# Patient Record
Sex: Female | Born: 1940 | Race: White | Hispanic: No | Smoking: Former smoker
Health system: Southern US, Community
[De-identification: ages and names within clinical notes are randomized; demographics above are authoritative.]

## PROBLEM LIST (undated history)

## (undated) DIAGNOSIS — I459 Conduction disorder, unspecified: Secondary | ICD-10-CM

## (undated) DIAGNOSIS — Z923 Personal history of irradiation: Secondary | ICD-10-CM

## (undated) DIAGNOSIS — M1712 Unilateral primary osteoarthritis, left knee: Secondary | ICD-10-CM

## (undated) DIAGNOSIS — M8580 Other specified disorders of bone density and structure, unspecified site: Secondary | ICD-10-CM

## (undated) DIAGNOSIS — Z8719 Personal history of other diseases of the digestive system: Secondary | ICD-10-CM

## (undated) DIAGNOSIS — I10 Essential (primary) hypertension: Secondary | ICD-10-CM

## (undated) DIAGNOSIS — K5790 Diverticulosis of intestine, part unspecified, without perforation or abscess without bleeding: Secondary | ICD-10-CM

## (undated) DIAGNOSIS — R739 Hyperglycemia, unspecified: Secondary | ICD-10-CM

## (undated) DIAGNOSIS — F419 Anxiety disorder, unspecified: Secondary | ICD-10-CM

## (undated) DIAGNOSIS — C3490 Malignant neoplasm of unspecified part of unspecified bronchus or lung: Secondary | ICD-10-CM

## (undated) DIAGNOSIS — K219 Gastro-esophageal reflux disease without esophagitis: Secondary | ICD-10-CM

## (undated) DIAGNOSIS — K649 Unspecified hemorrhoids: Secondary | ICD-10-CM

## (undated) DIAGNOSIS — C801 Malignant (primary) neoplasm, unspecified: Secondary | ICD-10-CM

## (undated) DIAGNOSIS — M1711 Unilateral primary osteoarthritis, right knee: Secondary | ICD-10-CM

## (undated) DIAGNOSIS — K5900 Constipation, unspecified: Secondary | ICD-10-CM

## (undated) DIAGNOSIS — E785 Hyperlipidemia, unspecified: Secondary | ICD-10-CM

## (undated) HISTORY — PX: TOTAL ABDOMINAL HYSTERECTOMY W/ BILATERAL SALPINGOOPHORECTOMY: SHX83

## (undated) HISTORY — PX: ABDOMINAL HYSTERECTOMY: SHX81

## (undated) HISTORY — PX: EYE SURGERY: SHX253

## (undated) HISTORY — PX: COLONOSCOPY: SHX174

## (undated) HISTORY — PX: CATARACT EXTRACTION, BILATERAL: SHX1313

---

## 2011-12-03 ENCOUNTER — Other Ambulatory Visit: Payer: Self-pay | Admitting: Family Medicine

## 2011-12-03 DIAGNOSIS — I10 Essential (primary) hypertension: Secondary | ICD-10-CM

## 2011-12-03 DIAGNOSIS — Z1231 Encounter for screening mammogram for malignant neoplasm of breast: Secondary | ICD-10-CM

## 2011-12-03 DIAGNOSIS — E785 Hyperlipidemia, unspecified: Secondary | ICD-10-CM

## 2011-12-03 HISTORY — DX: Essential (primary) hypertension: I10

## 2011-12-03 HISTORY — DX: Hyperlipidemia, unspecified: E78.5

## 2011-12-11 ENCOUNTER — Ambulatory Visit
Admission: RE | Admit: 2011-12-11 | Discharge: 2011-12-11 | Disposition: A | Discharge status: Home / Self Care | Payer: Medicare Other | Source: Ambulatory Visit | Attending: Family Medicine | Admitting: Family Medicine

## 2011-12-11 DIAGNOSIS — Z1231 Encounter for screening mammogram for malignant neoplasm of breast: Secondary | ICD-10-CM

## 2012-05-27 DIAGNOSIS — R739 Hyperglycemia, unspecified: Secondary | ICD-10-CM

## 2012-05-27 HISTORY — DX: Hyperglycemia, unspecified: R73.9

## 2014-04-12 ENCOUNTER — Other Ambulatory Visit: Payer: Self-pay

## 2014-04-12 DIAGNOSIS — Z1231 Encounter for screening mammogram for malignant neoplasm of breast: Secondary | ICD-10-CM

## 2014-04-20 ENCOUNTER — Ambulatory Visit
Admission: RE | Admit: 2014-04-20 | Discharge: 2014-04-20 | Disposition: A | Discharge status: Home / Self Care | Payer: Medicare Other | Source: Ambulatory Visit

## 2014-04-20 DIAGNOSIS — Z1231 Encounter for screening mammogram for malignant neoplasm of breast: Secondary | ICD-10-CM

## 2015-02-20 ENCOUNTER — Other Ambulatory Visit: Payer: Self-pay | Admitting: *Deleted

## 2015-02-20 ENCOUNTER — Other Ambulatory Visit: Payer: Self-pay | Admitting: Physician Assistant

## 2015-02-20 DIAGNOSIS — E2839 Other primary ovarian failure: Secondary | ICD-10-CM

## 2015-04-27 ENCOUNTER — Other Ambulatory Visit: Payer: Self-pay

## 2015-04-27 DIAGNOSIS — Z1231 Encounter for screening mammogram for malignant neoplasm of breast: Secondary | ICD-10-CM

## 2015-05-11 ENCOUNTER — Ambulatory Visit
Admission: RE | Admit: 2015-05-11 | Discharge: 2015-05-11 | Disposition: A | Discharge status: Home / Self Care | Payer: Medicare Other | Source: Ambulatory Visit | Attending: Physician Assistant | Admitting: Physician Assistant

## 2015-05-11 ENCOUNTER — Ambulatory Visit
Admission: RE | Admit: 2015-05-11 | Discharge: 2015-05-11 | Disposition: A | Discharge status: Home / Self Care | Payer: Medicare Other | Source: Ambulatory Visit

## 2015-05-11 DIAGNOSIS — Z1231 Encounter for screening mammogram for malignant neoplasm of breast: Secondary | ICD-10-CM

## 2015-05-11 DIAGNOSIS — E2839 Other primary ovarian failure: Secondary | ICD-10-CM

## 2015-05-30 DIAGNOSIS — M8580 Other specified disorders of bone density and structure, unspecified site: Secondary | ICD-10-CM | POA: Diagnosis present

## 2015-05-30 HISTORY — DX: Other specified disorders of bone density and structure, unspecified site: M85.80

## 2015-09-25 DIAGNOSIS — M25461 Effusion, right knee: Secondary | ICD-10-CM | POA: Diagnosis not present

## 2015-09-25 DIAGNOSIS — M25561 Pain in right knee: Secondary | ICD-10-CM | POA: Diagnosis not present

## 2015-09-25 DIAGNOSIS — S83241A Other tear of medial meniscus, current injury, right knee, initial encounter: Secondary | ICD-10-CM | POA: Diagnosis not present

## 2015-10-12 DIAGNOSIS — S83241D Other tear of medial meniscus, current injury, right knee, subsequent encounter: Secondary | ICD-10-CM | POA: Diagnosis not present

## 2015-10-12 DIAGNOSIS — M545 Low back pain: Secondary | ICD-10-CM | POA: Diagnosis not present

## 2015-10-12 DIAGNOSIS — M25551 Pain in right hip: Secondary | ICD-10-CM | POA: Diagnosis not present

## 2015-10-19 DIAGNOSIS — M5106 Intervertebral disc disorders with myelopathy, lumbar region: Secondary | ICD-10-CM | POA: Diagnosis not present

## 2015-10-19 DIAGNOSIS — M1711 Unilateral primary osteoarthritis, right knee: Secondary | ICD-10-CM | POA: Diagnosis not present

## 2015-10-19 DIAGNOSIS — M79604 Pain in right leg: Secondary | ICD-10-CM | POA: Diagnosis not present

## 2015-10-19 DIAGNOSIS — M5416 Radiculopathy, lumbar region: Secondary | ICD-10-CM | POA: Diagnosis not present

## 2015-10-26 DIAGNOSIS — M1711 Unilateral primary osteoarthritis, right knee: Secondary | ICD-10-CM | POA: Diagnosis not present

## 2015-10-31 DIAGNOSIS — M25561 Pain in right knee: Secondary | ICD-10-CM | POA: Diagnosis not present

## 2015-11-02 DIAGNOSIS — S83241D Other tear of medial meniscus, current injury, right knee, subsequent encounter: Secondary | ICD-10-CM | POA: Diagnosis not present

## 2015-11-08 DIAGNOSIS — R9431 Abnormal electrocardiogram [ECG] [EKG]: Secondary | ICD-10-CM | POA: Diagnosis not present

## 2015-11-08 DIAGNOSIS — M1731 Unilateral post-traumatic osteoarthritis, right knee: Secondary | ICD-10-CM | POA: Diagnosis not present

## 2015-11-08 DIAGNOSIS — Z01818 Encounter for other preprocedural examination: Secondary | ICD-10-CM | POA: Diagnosis not present

## 2015-11-20 DIAGNOSIS — Z01810 Encounter for preprocedural cardiovascular examination: Secondary | ICD-10-CM | POA: Diagnosis not present

## 2015-11-20 DIAGNOSIS — R9431 Abnormal electrocardiogram [ECG] [EKG]: Secondary | ICD-10-CM | POA: Diagnosis not present

## 2015-11-21 DIAGNOSIS — Z01810 Encounter for preprocedural cardiovascular examination: Secondary | ICD-10-CM | POA: Diagnosis not present

## 2015-11-21 DIAGNOSIS — R9431 Abnormal electrocardiogram [ECG] [EKG]: Secondary | ICD-10-CM | POA: Diagnosis not present

## 2015-11-21 DIAGNOSIS — I10 Essential (primary) hypertension: Secondary | ICD-10-CM | POA: Diagnosis not present

## 2015-11-21 DIAGNOSIS — F17200 Nicotine dependence, unspecified, uncomplicated: Secondary | ICD-10-CM | POA: Diagnosis not present

## 2015-12-06 ENCOUNTER — Encounter (HOSPITAL_COMMUNITY): Payer: Self-pay | Admitting: Physician Assistant

## 2015-12-06 DIAGNOSIS — S83241D Other tear of medial meniscus, current injury, right knee, subsequent encounter: Secondary | ICD-10-CM | POA: Diagnosis not present

## 2015-12-06 DIAGNOSIS — K5790 Diverticulosis of intestine, part unspecified, without perforation or abscess without bleeding: Secondary | ICD-10-CM

## 2015-12-06 DIAGNOSIS — I459 Conduction disorder, unspecified: Secondary | ICD-10-CM | POA: Diagnosis present

## 2015-12-06 DIAGNOSIS — K5900 Constipation, unspecified: Secondary | ICD-10-CM

## 2015-12-06 DIAGNOSIS — M1711 Unilateral primary osteoarthritis, right knee: Secondary | ICD-10-CM

## 2015-12-06 DIAGNOSIS — K649 Unspecified hemorrhoids: Secondary | ICD-10-CM

## 2015-12-06 HISTORY — DX: Unspecified hemorrhoids: K64.9

## 2015-12-06 HISTORY — DX: Conduction disorder, unspecified: I45.9

## 2015-12-06 HISTORY — DX: Diverticulosis of intestine, part unspecified, without perforation or abscess without bleeding: K57.90

## 2015-12-06 HISTORY — DX: Constipation, unspecified: K59.00

## 2015-12-06 HISTORY — DX: Unilateral primary osteoarthritis, right knee: M17.11

## 2015-12-06 NOTE — H&P (Signed)
TOTAL KNEE ADMISSION H&P  Patient is being admitted for right unicompartmental knee arthroplasty.  Subjective:  Chief Complaint:right knee pain.  HPI: [name], 75 y.o. female, has a history of pain and functional disability in the right knee due to arthritis and has failed non-surgical conservative treatments for greater than 12 weeks to includeNSAID's and/or analgesics, corticosteriod injections, viscosupplementation injections, flexibility and strengthening excercises, supervised PT with diminished ADL's post treatment, use of assistive devices, weight reduction as appropriate and activity modification.  Onset of symptoms was gradual, starting 10 years ago with gradually worsening course since that time. The patient noted no past surgery on the right knee(s).  Patient currently rates pain in the right knee(s) at 10 out of 10 with activity. Patient has night pain, worsening of pain with activity and weight bearing, pain that interferes with activities of daily living, crepitus and joint swelling.  Patient has evidence of subchondral sclerosis, periarticular osteophytes and joint space narrowing by imaging studies.  There is no active infection.  Patient Active Problem List   Diagnosis Date Noted  . 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 CN (constipation) 12/06/2015  . 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 Hemorrhoid 12/06/2015  . Primary localized osteoarthritis of right knee 12/06/2015  . Osteopenia 05/30/2015  . Blood glucose elevated 05/27/2012  . BP (high blood pressure) 12/03/2011  . HLD (hyperlipidemia) 12/03/2011   Past Medical History  Diagnosis Date  . 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 CN (constipation) 12/06/2015  . DD (diverticular disease) 12/06/2015    Overview:  Dr Benson Norway  Last Assessment & Plan:  Relevant Hx: Course: Daily  Update: Today's Plan:   . Hemorrhoid 12/06/2015  . BP (high blood pressure) 12/03/2011  . Blood glucose elevated 05/27/2012  . HLD (hyperlipidemia) 12/03/2011  . Osteopenia 05/30/2015    Overview:  Bone density 05/2015 started fosamax   . Primary localized osteoarthritis of right knee 12/06/2015    No past surgical history on file.  No prescriptions prior to admission   Allergies  Allergen Reactions  . Codeine   . Meperidine   . Sulfa Antibiotics     Social History  Substance Use Topics  . Smoking status: Not on file  . Smokeless tobacco: Not on file  . Alcohol Use: Not on file    No family history on file.   Review of Systems  Constitutional: Negative.   HENT: Negative.   Eyes: Negative.   Respiratory: Negative.   Cardiovascular: Negative.   Gastrointestinal: Negative.   Genitourinary: Negative.   Musculoskeletal: Positive for back pain and joint pain.  Skin: Negative.   Neurological: Negative.   Endo/Heme/Allergies: Negative.     Objective:  Physical Exam  Constitutional: She is oriented to person, place, and time. She appears well-developed and well-nourished.  HENT:  Head: Normocephalic and atraumatic.  Mouth/Throat: Oropharynx is clear and moist.  Eyes: Conjunctivae are normal. Pupils are equal, round, and reactive to light.  Neck: Normal range of motion. Neck supple.  Cardiovascular: Normal rate, regular rhythm and normal heart sounds.   Respiratory: Effort normal and breath sounds normal.  GI: Soft. Bowel sounds are normal.  Genitourinary:  Not pertinent to current symptomatology therefore not examined.  Musculoskeletal:  Examination of her right knee reveals pain medially. There is 3+ effusion. The range of motion is from 0-120 degrees. The knee  is stable with normal patellar tracking. Examination of the left knee reveals a full range of motion without pain, weakness or instability. Vascular exam: pulses 2+ and symmetric. Neurologic exam, distal motor and  sensory examination within normal limits.    Neurological: She is alert and oriented to person, place, and time.  Skin: Skin is warm and dry.  Psychiatric: She has a normal mood and affect. Her behavior is normal.    Vital signs in last 24 hours: Temp:  [97.5 F (36.4 C)] 97.5 F (36.4 C) (03/29 1500) Pulse Rate:  [73] 73 (03/29 1500) BP: (129)/(76) 129/76 mmHg (03/29 1500) SpO2:  [100 %] 100 % (03/29 1500) Weight:  [70.761 kg (156 lb)] 70.761 kg (156 lb) (03/29 1500)  Labs:   Estimated body mass index is 27.64 kg/(m^2) as calculated from the following:   Height as of this encounter: '5\' 3"'$  (1.6 m).   Weight as of this encounter: 70.761 kg (156 lb).   Imaging Review Plain radiographs demonstrate severe degenerative joint disease of the right knee(s). The overall alignment issignificant varus. The bone quality appears to be good for age and reported activity level.  Assessment/Plan:  End stage arthritis, right knee Principal Problem:   Primary localized osteoarthritis of right knee Active Problems:   Cardiac conduction disorder   CN (constipation)   DD (diverticular disease)   Hemorrhoid   BP (high blood pressure)   Blood glucose elevated   HLD (hyperlipidemia)   Osteopenia    The patient history, physical examination, clinical judgment of the provider and imaging studies are consistent with end stage degenerative joint disease of the right knee(s) and total knee arthroplasty is deemed medically necessary. The treatment options including medical management, injection therapy arthroscopy and arthroplasty were discussed at length. The risks and benefits of total knee arthroplasty were presented and reviewed. The risks due to aseptic loosening, infection, stiffness, patella tracking problems, thromboembolic complications and other imponderables were discussed. The patient acknowledged the explanation, agreed to proceed with the plan and consent was signed. Patient is being  admitted for inpatient treatment for surgery, pain control, PT, OT, prophylactic antibiotics, VTE prophylaxis, progressive ambulation and ADL's and discharge planning. The patient is planning to be discharged home with home health services

## 2015-12-07 ENCOUNTER — Encounter (HOSPITAL_COMMUNITY): Payer: Self-pay

## 2015-12-07 ENCOUNTER — Encounter (HOSPITAL_COMMUNITY)
Admission: RE | Admit: 2015-12-07 | Discharge: 2015-12-07 | Disposition: A | Discharge status: Home / Self Care | Payer: Medicare Other | Source: Ambulatory Visit | Attending: Orthopedic Surgery | Admitting: Orthopedic Surgery

## 2015-12-07 DIAGNOSIS — Z7982 Long term (current) use of aspirin: Secondary | ICD-10-CM | POA: Diagnosis not present

## 2015-12-07 DIAGNOSIS — Z0183 Encounter for blood typing: Secondary | ICD-10-CM | POA: Insufficient documentation

## 2015-12-07 DIAGNOSIS — E785 Hyperlipidemia, unspecified: Secondary | ICD-10-CM | POA: Diagnosis not present

## 2015-12-07 DIAGNOSIS — Z01812 Encounter for preprocedural laboratory examination: Secondary | ICD-10-CM | POA: Insufficient documentation

## 2015-12-07 DIAGNOSIS — M1711 Unilateral primary osteoarthritis, right knee: Secondary | ICD-10-CM | POA: Insufficient documentation

## 2015-12-07 DIAGNOSIS — Z79899 Other long term (current) drug therapy: Secondary | ICD-10-CM | POA: Insufficient documentation

## 2015-12-07 DIAGNOSIS — I10 Essential (primary) hypertension: Secondary | ICD-10-CM | POA: Diagnosis not present

## 2015-12-07 DIAGNOSIS — K219 Gastro-esophageal reflux disease without esophagitis: Secondary | ICD-10-CM | POA: Diagnosis not present

## 2015-12-07 DIAGNOSIS — Z01818 Encounter for other preprocedural examination: Secondary | ICD-10-CM | POA: Insufficient documentation

## 2015-12-07 DIAGNOSIS — Z7983 Long term (current) use of bisphosphonates: Secondary | ICD-10-CM | POA: Insufficient documentation

## 2015-12-07 HISTORY — DX: Gastro-esophageal reflux disease without esophagitis: K21.9

## 2015-12-07 HISTORY — DX: Personal history of other diseases of the digestive system: Z87.19

## 2015-12-07 HISTORY — DX: Malignant (primary) neoplasm, unspecified: C80.1

## 2015-12-07 LAB — CBC WITH DIFFERENTIAL/PLATELET
BASOS ABS: 0 10*3/uL (ref 0.0–0.1)
Basophils Relative: 0 %
Eosinophils Absolute: 0.1 10*3/uL (ref 0.0–0.7)
Eosinophils Relative: 1 %
HEMATOCRIT: 44.2 % (ref 36.0–46.0)
HEMOGLOBIN: 15.1 g/dL — AB (ref 12.0–15.0)
LYMPHS PCT: 19 %
Lymphs Abs: 2 10*3/uL (ref 0.7–4.0)
MCH: 32.5 pg (ref 26.0–34.0)
MCHC: 34.2 g/dL (ref 30.0–36.0)
MCV: 95.3 fL (ref 78.0–100.0)
MONO ABS: 0.5 10*3/uL (ref 0.1–1.0)
MONOS PCT: 5 %
NEUTROS ABS: 8 10*3/uL — AB (ref 1.7–7.7)
Neutrophils Relative %: 75 %
Platelets: 243 10*3/uL (ref 150–400)
RBC: 4.64 MIL/uL (ref 3.87–5.11)
RDW: 13.1 % (ref 11.5–15.5)
WBC: 10.6 10*3/uL — ABNORMAL HIGH (ref 4.0–10.5)

## 2015-12-07 LAB — TYPE AND SCREEN
ABO/RH(D): A POS
ANTIBODY SCREEN: NEGATIVE

## 2015-12-07 LAB — COMPREHENSIVE METABOLIC PANEL
ALK PHOS: 66 U/L (ref 38–126)
ALT: 13 U/L — ABNORMAL LOW (ref 14–54)
AST: 16 U/L (ref 15–41)
Albumin: 3.9 g/dL (ref 3.5–5.0)
Anion gap: 11 (ref 5–15)
BUN: 12 mg/dL (ref 6–20)
CO2: 21 mmol/L — ABNORMAL LOW (ref 22–32)
CREATININE: 0.88 mg/dL (ref 0.44–1.00)
Calcium: 9.4 mg/dL (ref 8.9–10.3)
Chloride: 104 mmol/L (ref 101–111)
GFR calc Af Amer: >60 mL/min (ref >60)
Glucose, Bld: 97 mg/dL (ref 65–99)
POTASSIUM: 4.2 mmol/L (ref 3.5–5.1)
Sodium: 136 mmol/L (ref 135–145)
TOTAL PROTEIN: 6.5 g/dL (ref 6.5–8.1)
Total Bilirubin: 0.6 mg/dL (ref 0.3–1.2)

## 2015-12-07 LAB — PROTIME-INR
INR: 1.03 (ref 0.00–1.49)
PROTHROMBIN TIME: 13.7 s (ref 11.6–15.2)

## 2015-12-07 LAB — SURGICAL PCR SCREEN
MRSA, PCR: NEGATIVE
Staphylococcus aureus: NEGATIVE

## 2015-12-07 LAB — ABO/RH: ABO/RH(D): A POS

## 2015-12-07 LAB — APTT: APTT: 27 s (ref 24–37)

## 2015-12-07 NOTE — Pre-Procedure Instructions (Signed)
Requested EKG from Dr. Fayrene Fearing office (PCP).  Will request records and clearance from Dr. Irven Shelling office.  Notes from Dr. Fayrene Fearing office in [place] (was referred to Dr. Einar Gip for inverted T waves on EKG, patient states 'everything turned out fine').

## 2015-12-07 NOTE — Pre-Procedure Instructions (Signed)
[name]  12/07/2015      [name] [facility] [id number]: [phone]: [phone]   Your procedure is scheduled on Monday April 10th.  [facility]  Call this number if you have problems the morning of surgery:  [phone]   Remember:  Do not eat food or drink liquids after midnight Sunday April 9th.  Take these medicines the morning of surgery with A SIP OF WATER acetaminophen (tylenol) if needed, cetirizine (zyrtec) if needed, metoprolol succinate (toprol-xl)  STOP: ALL Vitamins, Supplements, Effient and Herbal Medications, Fish Oils, Aspirins, NSAIDs (Nonsteroidal Anti-inflammatories such as Ibuprofen, Aleve, or Advil), and Goody's/BC Powders 5-7 days prior to surgery, until after surgery as directed by your physician. Stop diclofenac (voltaren) gel, meloxicam    Do not wear jewelry, make-up or nail polish.  Do not wear lotions, powders, or perfumes.  You may wear deodorant.  Do not shave 48 hours prior to surgery.  Men may shave face and neck.  Do not bring valuables to the hospital.  [name] or [facility]ables.  Contacts, dentures or bridgework may not be worn into surgery.  Leave your suitcase in the car.  After surgery it may be brought to your room.  For patients admitted to the hospital, discharge time will be determined by your treatment team.  Patients discharged the day of surgery will not be allowed to drive home.        [name] Before surgery, you can play an important role.  Because skin is not sterile, your skin needs to be as free of germs as possible.  You can reduce the number of germs on your skin by washing with CHG (chlorahexidine gluconate) Soap before surgery.  CHG is an antiseptic cleaner with kills germs and bonds with the  skin to continue killing germs even after washing.   Please do not use if you have an allergy to CHG or antibacterial soaps.  If your skin becomes reddened/irritated stop using the CHG.  Do not shave (including legs and underarms) for at least 48 hours prior to first CHG shower.  It is okay to shave your face.  Please follow these instructions carefully:  1. Shower with CHG Soap the night before surgery and the morning of Surgery. 2. If you choose to wash your hair, wash your hair first as usual with your normal shampoo. 3. After you shampoo, rinse your hair and body thoroughly to remove the Shampoo. 4. Use CHG as you would any other liquid soap. You can apply chg directly to the skin and wash gently with scrungie or a clean washcloth. 5. Apply the CHG Soap to your body ONLY FROM THE NECK DOWN. Do not use on open wounds or open sores. Avoid contact with your eyes, ears, mouth and genitals (private parts). Wash genitals (private parts) with your normal soap. 6. Wash thoroughly, paying special attention to the area where your surgery will be performed. 7. Thoroughly rinse your body with warm water from the neck down. 8. DO NOT shower/wash with your normal soap after using and rinsing off the CHG Soap. 9. Pat yourself dry with a clean towel.  10. Wear clean pajamas.  11. Place clean sheets on your bed the night of your first shower and do not  sleep with pets.  Day of Surgery  Do not apply any lotions/deodorants the morning of surgery. Please wear clean clothes to the hospital/surgery center.   Please read over the following fact sheets that you were given. Pain Booklet, Coughing and Deep Breathing, Blood Transfusion Information, MRSA Information and Surgical Site Infection Prevention

## 2015-12-08 NOTE — Progress Notes (Signed)
Anesthesia Chart Review: Patient is a 75 year old female scheduled for right unicompartmental knee replacement on 12/18/15 by Dr. Noemi Chapel.  History includes smoking, HTN, HLD, hiatal hernia, GERD, skin cancer, elevated glucose (without mention of DM; A1c 5.8 02/20/15), hysterectomy. BMI 28.   PCP is Dr. Anastasia Pall ([facility]). He saw her for pre-operative evaluation on 11/08/15. Due to "inverted T waves" in anterior leads he referred her for cardiac stress testing. She was referred to [facility] and saw Neldon Labella, NP with Dr. Adrian Prows. Perfusion imaging was normal so patient was cleared with "acceptable cardiovascular risk" PRN cardiology follow-up recommended.  Medications include Fosamax, ASA, Tums, Zyrtec, Mobic, Toprol XL, Zocor.  11/21/15 EKG [facility]): NSR, non-specific ST/T wave abnormality.  11/20/15 Nuclear stress test [facility] CV): Impression: 1. The resting ECG demonstrated NSR, normal resting conduction, no resting arrythmias and non-specific ST-T changes. Stress ECG is non-diagnostic for ischemia as it a pharmacologic stress using Lexiscan. Stress symptoms included dyspnea.  2. Myocardial perfusion imaging is normal. Overall LV systolic function was normal without regional wall motion abnormalities. LVEF was 63%.   Reportedly, she had a cardiac cath in 2011 and "was told it was completely normal." I do not have that report.  Preoperative labs noted. Urine culture showed > 100,000 gram negative rods. Final report with sensitivities are pending. Have communicated with Luna Glasgow, PA regarding positive urine culture results. Will defer treatment recommendations to the surgeon/PA.  If no acute changes then I anticipate that she can proceed as planned from an anesthesia standpoint.  George Hugh [facility] Short Stay Center/Anesthesiology Phone [phone] 12/08/2015 4:36 PM

## 2015-12-09 LAB — URINE CULTURE: Culture: >=100000

## 2015-12-14 ENCOUNTER — Other Ambulatory Visit (HOSPITAL_COMMUNITY): Payer: Medicare Other

## 2015-12-15 MED ORDER — CEFAZOLIN SODIUM-DEXTROSE 2-4 GM/100ML-% IV SOLN
2.0000 g | INTRAVENOUS | Status: AC
  Administered 2015-12-18: 2 g via INTRAVENOUS
  Filled 2015-12-15: qty 100

## 2015-12-15 MED ORDER — LACTATED RINGERS IV SOLN
INTRAVENOUS | Status: DC
  Administered 2015-12-18 (×3): via INTRAVENOUS

## 2015-12-18 ENCOUNTER — Inpatient Hospital Stay (HOSPITAL_COMMUNITY): Payer: Medicare Other | Admitting: Vascular Surgery

## 2015-12-18 ENCOUNTER — Inpatient Hospital Stay (HOSPITAL_COMMUNITY)
Admission: RE | Admit: 2015-12-18 | Discharge: 2015-12-19 | DRG: 470 | Disposition: A | Discharge status: Home Health | Payer: Medicare Other | Source: Ambulatory Visit | Attending: Orthopedic Surgery | Admitting: Orthopedic Surgery

## 2015-12-18 ENCOUNTER — Encounter (HOSPITAL_COMMUNITY): Payer: Self-pay | Admitting: Certified Registered Nurse Anesthetist

## 2015-12-18 ENCOUNTER — Inpatient Hospital Stay (HOSPITAL_COMMUNITY): Payer: Medicare Other | Admitting: Certified Registered Nurse Anesthetist

## 2015-12-18 ENCOUNTER — Encounter (HOSPITAL_COMMUNITY)
Admission: RE | Disposition: A | Discharge status: Home Health | Payer: Self-pay | Source: Ambulatory Visit | Attending: Orthopedic Surgery

## 2015-12-18 DIAGNOSIS — I10 Essential (primary) hypertension: Secondary | ICD-10-CM | POA: Diagnosis present

## 2015-12-18 DIAGNOSIS — M8580 Other specified disorders of bone density and structure, unspecified site: Secondary | ICD-10-CM | POA: Diagnosis present

## 2015-12-18 DIAGNOSIS — G8918 Other acute postprocedural pain: Secondary | ICD-10-CM | POA: Diagnosis not present

## 2015-12-18 DIAGNOSIS — E785 Hyperlipidemia, unspecified: Secondary | ICD-10-CM | POA: Diagnosis present

## 2015-12-18 DIAGNOSIS — K5900 Constipation, unspecified: Secondary | ICD-10-CM | POA: Diagnosis present

## 2015-12-18 DIAGNOSIS — M1711 Unilateral primary osteoarthritis, right knee: Principal | ICD-10-CM | POA: Diagnosis present

## 2015-12-18 DIAGNOSIS — I459 Conduction disorder, unspecified: Secondary | ICD-10-CM | POA: Diagnosis present

## 2015-12-18 DIAGNOSIS — K219 Gastro-esophageal reflux disease without esophagitis: Secondary | ICD-10-CM | POA: Diagnosis present

## 2015-12-18 DIAGNOSIS — K5790 Diverticulosis of intestine, part unspecified, without perforation or abscess without bleeding: Secondary | ICD-10-CM | POA: Diagnosis present

## 2015-12-18 DIAGNOSIS — R739 Hyperglycemia, unspecified: Secondary | ICD-10-CM | POA: Diagnosis present

## 2015-12-18 DIAGNOSIS — F17200 Nicotine dependence, unspecified, uncomplicated: Secondary | ICD-10-CM | POA: Diagnosis present

## 2015-12-18 DIAGNOSIS — J449 Chronic obstructive pulmonary disease, unspecified: Secondary | ICD-10-CM | POA: Diagnosis present

## 2015-12-18 DIAGNOSIS — M25561 Pain in right knee: Secondary | ICD-10-CM | POA: Diagnosis not present

## 2015-12-18 DIAGNOSIS — Z85828 Personal history of other malignant neoplasm of skin: Secondary | ICD-10-CM

## 2015-12-18 DIAGNOSIS — M179 Osteoarthritis of knee, unspecified: Secondary | ICD-10-CM | POA: Diagnosis not present

## 2015-12-18 DIAGNOSIS — K649 Unspecified hemorrhoids: Secondary | ICD-10-CM | POA: Diagnosis present

## 2015-12-18 HISTORY — DX: Hyperlipidemia, unspecified: E78.5

## 2015-12-18 HISTORY — DX: Constipation, unspecified: K59.00

## 2015-12-18 HISTORY — DX: Diverticulosis of intestine, part unspecified, without perforation or abscess without bleeding: K57.90

## 2015-12-18 HISTORY — DX: Unspecified hemorrhoids: K64.9

## 2015-12-18 HISTORY — PX: PARTIAL KNEE ARTHROPLASTY: SHX2174

## 2015-12-18 HISTORY — DX: Unilateral primary osteoarthritis, right knee: M17.11

## 2015-12-18 HISTORY — DX: Hyperglycemia, unspecified: R73.9

## 2015-12-18 HISTORY — DX: Other specified disorders of bone density and structure, unspecified site: M85.80

## 2015-12-18 HISTORY — DX: Essential (primary) hypertension: I10

## 2015-12-18 HISTORY — DX: Conduction disorder, unspecified: I45.9

## 2015-12-18 SURGERY — ARTHROPLASTY, KNEE, UNICOMPARTMENTAL
Anesthesia: General | Site: Knee | Laterality: Right

## 2015-12-18 MED ORDER — CALCIUM CARBONATE ANTACID 500 MG PO CHEW
1.0000 | CHEWABLE_TABLET | Freq: Every day | ORAL | Status: DC
  Administered 2015-12-19: 200 mg via ORAL
  Filled 2015-12-18 (×2): qty 1

## 2015-12-18 MED ORDER — METOPROLOL SUCCINATE ER 50 MG PO TB24
50.0000 mg | ORAL_TABLET | Freq: Every day | ORAL | Status: DC
  Administered 2015-12-19: 50 mg via ORAL
  Filled 2015-12-18: qty 1

## 2015-12-18 MED ORDER — CHLORHEXIDINE GLUCONATE 4 % EX LIQD: 60.0000 mL | Freq: Once | CUTANEOUS | Status: DC

## 2015-12-18 MED ORDER — HYDROMORPHONE HCL 1 MG/ML IJ SOLN: 0.5000 mg | INTRAMUSCULAR | Status: DC | PRN

## 2015-12-18 MED ORDER — PROPOFOL 500 MG/50ML IV EMUL
INTRAVENOUS | Status: DC | PRN
  Administered 2015-12-18: 75 ug/kg/min via INTRAVENOUS

## 2015-12-18 MED ORDER — POTASSIUM CHLORIDE IN NACL 20-0.9 MEQ/L-% IV SOLN
INTRAVENOUS | Status: DC
  Administered 2015-12-18 (×2): via INTRAVENOUS
  Filled 2015-12-18 (×2): qty 1000

## 2015-12-18 MED ORDER — DEXAMETHASONE SODIUM PHOSPHATE 10 MG/ML IJ SOLN
INTRAMUSCULAR | Status: DC | PRN
  Administered 2015-12-18: 10 mg via INTRAVENOUS

## 2015-12-18 MED ORDER — MENTHOL 3 MG MT LOZG: 1.0000 | LOZENGE | OROMUCOSAL | Status: DC | PRN

## 2015-12-18 MED ORDER — HYDROMORPHONE HCL 2 MG PO TABS
2.0000 mg | ORAL_TABLET | ORAL | Status: DC | PRN
  Administered 2015-12-18: 2 mg via ORAL
  Administered 2015-12-18: 4 mg via ORAL
  Administered 2015-12-18 (×2): 2 mg via ORAL
  Administered 2015-12-19 (×2): 4 mg via ORAL
  Administered 2015-12-19: 2 mg via ORAL
  Filled 2015-12-18: qty 1
  Filled 2015-12-18 (×2): qty 2
  Filled 2015-12-18 (×2): qty 1
  Filled 2015-12-18: qty 2
  Filled 2015-12-18: qty 1

## 2015-12-18 MED ORDER — FENTANYL CITRATE (PF) 250 MCG/5ML IJ SOLN
INTRAMUSCULAR | Status: AC
  Filled 2015-12-18: qty 5

## 2015-12-18 MED ORDER — POLYETHYLENE GLYCOL 3350 17 G PO PACK
17.0000 g | PACK | Freq: Two times a day (BID) | ORAL | Status: DC
  Administered 2015-12-18 – 2015-12-19 (×3): 17 g via ORAL
  Filled 2015-12-18 (×3): qty 1

## 2015-12-18 MED ORDER — SIMVASTATIN 20 MG PO TABS
20.0000 mg | ORAL_TABLET | Freq: Every day | ORAL | Status: DC
  Administered 2015-12-18: 20 mg via ORAL
  Filled 2015-12-18: qty 1

## 2015-12-18 MED ORDER — PROPOFOL 10 MG/ML IV BOLUS
INTRAVENOUS | Status: AC
  Filled 2015-12-18: qty 40

## 2015-12-18 MED ORDER — MIDAZOLAM HCL 2 MG/2ML IJ SOLN
INTRAMUSCULAR | Status: AC
  Filled 2015-12-18: qty 2

## 2015-12-18 MED ORDER — 0.9 % SODIUM CHLORIDE (POUR BTL) OPTIME
TOPICAL | Status: DC | PRN
  Administered 2015-12-18: 1000 mL

## 2015-12-18 MED ORDER — ACETAMINOPHEN 325 MG PO TABS: 650.0000 mg | ORAL_TABLET | Freq: Four times a day (QID) | ORAL | Status: DC | PRN

## 2015-12-18 MED ORDER — SODIUM CHLORIDE 0.9 % IR SOLN
Status: DC | PRN
  Administered 2015-12-18: 2000 mL

## 2015-12-18 MED ORDER — EPHEDRINE SULFATE 50 MG/ML IJ SOLN
INTRAMUSCULAR | Status: DC | PRN
  Administered 2015-12-18: 5 mg via INTRAVENOUS
  Administered 2015-12-18: 2.5 mg via INTRAVENOUS
  Administered 2015-12-18: 5 mg via INTRAVENOUS

## 2015-12-18 MED ORDER — MIDAZOLAM HCL 2 MG/2ML IJ SOLN: 0.5000 mg | Freq: Once | INTRAMUSCULAR | Status: DC | PRN

## 2015-12-18 MED ORDER — BUPIVACAINE-EPINEPHRINE 0.25% -1:200000 IJ SOLN
INTRAMUSCULAR | Status: DC | PRN
  Administered 2015-12-18: 10 mL

## 2015-12-18 MED ORDER — DIPHENHYDRAMINE HCL 12.5 MG/5ML PO ELIX: 12.5000 mg | ORAL_SOLUTION | ORAL | Status: DC | PRN

## 2015-12-18 MED ORDER — MORPHINE SULFATE (PF) 2 MG/ML IV SOLN: 2.0000 mg | INTRAVENOUS | Status: DC | PRN

## 2015-12-18 MED ORDER — BUPIVACAINE-EPINEPHRINE (PF) 0.5% -1:200000 IJ SOLN
INTRAMUSCULAR | Status: DC | PRN
  Administered 2015-12-18: 30 mL via PERINEURAL

## 2015-12-18 MED ORDER — ONDANSETRON HCL 4 MG PO TABS: 4.0000 mg | ORAL_TABLET | Freq: Four times a day (QID) | ORAL | Status: DC | PRN

## 2015-12-18 MED ORDER — LORATADINE 10 MG PO TABS: 10.0000 mg | ORAL_TABLET | Freq: Every day | ORAL | Status: DC

## 2015-12-18 MED ORDER — FENTANYL CITRATE (PF) 100 MCG/2ML IJ SOLN
INTRAMUSCULAR | Status: DC | PRN
  Administered 2015-12-18 (×2): 50 ug via INTRAVENOUS

## 2015-12-18 MED ORDER — POVIDONE-IODINE 7.5 % EX SOLN
Freq: Once | CUTANEOUS | Status: DC
  Filled 2015-12-18: qty 118

## 2015-12-18 MED ORDER — DEXAMETHASONE SODIUM PHOSPHATE 10 MG/ML IJ SOLN
10.0000 mg | Freq: Three times a day (TID) | INTRAMUSCULAR | Status: DC
  Administered 2015-12-18 – 2015-12-19 (×3): 10 mg via INTRAVENOUS
  Filled 2015-12-18 (×3): qty 1

## 2015-12-18 MED ORDER — BUPIVACAINE-EPINEPHRINE (PF) 0.25% -1:200000 IJ SOLN
INTRAMUSCULAR | Status: AC
  Filled 2015-12-18: qty 30

## 2015-12-18 MED ORDER — METOCLOPRAMIDE HCL 5 MG/ML IJ SOLN: 5.0000 mg | Freq: Three times a day (TID) | INTRAMUSCULAR | Status: DC | PRN

## 2015-12-18 MED ORDER — PHENOL 1.4 % MT LIQD: 1.0000 | OROMUCOSAL | Status: DC | PRN

## 2015-12-18 MED ORDER — ONDANSETRON HCL 4 MG/2ML IJ SOLN: 4.0000 mg | Freq: Four times a day (QID) | INTRAMUSCULAR | Status: DC | PRN

## 2015-12-18 MED ORDER — BUPIVACAINE IN DEXTROSE 0.75-8.25 % IT SOLN
INTRATHECAL | Status: DC | PRN
  Administered 2015-12-18: 13.5 mg via INTRATHECAL

## 2015-12-18 MED ORDER — ADULT MULTIVITAMIN W/MINERALS CH
1.0000 | ORAL_TABLET | Freq: Every day | ORAL | Status: DC
  Administered 2015-12-18 – 2015-12-19 (×2): 1 via ORAL
  Filled 2015-12-18 (×2): qty 1

## 2015-12-18 MED ORDER — DOCUSATE SODIUM 100 MG PO CAPS
100.0000 mg | ORAL_CAPSULE | Freq: Two times a day (BID) | ORAL | Status: DC
  Administered 2015-12-18 – 2015-12-19 (×3): 100 mg via ORAL
  Filled 2015-12-18 (×3): qty 1

## 2015-12-18 MED ORDER — PHENYLEPHRINE HCL 10 MG/ML IJ SOLN
INTRAMUSCULAR | Status: DC | PRN
  Administered 2015-12-18 (×2): 80 ug via INTRAVENOUS

## 2015-12-18 MED ORDER — ACETAMINOPHEN 650 MG RE SUPP: 650.0000 mg | Freq: Four times a day (QID) | RECTAL | Status: DC | PRN

## 2015-12-18 MED ORDER — ASPIRIN EC 325 MG PO TBEC
325.0000 mg | DELAYED_RELEASE_TABLET | Freq: Every day | ORAL | Status: DC
  Administered 2015-12-19: 325 mg via ORAL
  Filled 2015-12-18: qty 1

## 2015-12-18 MED ORDER — LIDOCAINE HCL (CARDIAC) 20 MG/ML IV SOLN
INTRAVENOUS | Status: DC | PRN
  Administered 2015-12-18: 40 mg via INTRATRACHEAL

## 2015-12-18 MED ORDER — ALUM & MAG HYDROXIDE-SIMETH 200-200-20 MG/5ML PO SUSP: 30.0000 mL | ORAL | Status: DC | PRN

## 2015-12-18 MED ORDER — METOCLOPRAMIDE HCL 5 MG PO TABS
5.0000 mg | ORAL_TABLET | Freq: Three times a day (TID) | ORAL | Status: DC | PRN
Start: 2015-12-18 — End: 2015-12-19

## 2015-12-18 MED ORDER — CEFAZOLIN SODIUM-DEXTROSE 2-4 GM/100ML-% IV SOLN
2.0000 g | Freq: Four times a day (QID) | INTRAVENOUS | Status: AC
  Administered 2015-12-18 (×2): 2 g via INTRAVENOUS
  Filled 2015-12-18 (×3): qty 100

## 2015-12-18 MED ORDER — PHENYLEPHRINE HCL 10 MG/ML IJ SOLN
10.0000 mg | INTRAVENOUS | Status: DC | PRN
  Administered 2015-12-18: 25 ug/min via INTRAVENOUS

## 2015-12-18 SURGICAL SUPPLY — 72 items
BANDAGE ELASTIC 4 VELCRO ST LF (GAUZE/BANDAGES/DRESSINGS) ×3 IMPLANT
BANDAGE ELASTIC 6 VELCRO ST LF (GAUZE/BANDAGES/DRESSINGS) ×3 IMPLANT
BANDAGE ESMARK 6X9 LF (GAUZE/BANDAGES/DRESSINGS) ×1 IMPLANT
BLADE SURG 11 STRL SS (BLADE) IMPLANT
BNDG ESMARK 6X9 LF (GAUZE/BANDAGES/DRESSINGS) ×3
BOWL SMART MIX CTS (DISPOSABLE) ×3 IMPLANT
CAPT KNEE PARTIAL 2 ×3 IMPLANT
CEMENT HV SMART SET (Cement) ×3 IMPLANT
CLOSURE WOUND 1/2 X4 (GAUZE/BANDAGES/DRESSINGS) ×1
COVER SURGICAL LIGHT HANDLE (MISCELLANEOUS) ×3 IMPLANT
CUFF TOURNIQUET SINGLE 34IN LL (TOURNIQUET CUFF) ×3 IMPLANT
DRAPE EXTREMITY T 121X128X90 (DRAPE) ×3 IMPLANT
DRAPE INCISE IOBAN 66X45 STRL (DRAPES) ×3 IMPLANT
DRAPE PROXIMA HALF (DRAPES) ×3 IMPLANT
DRAPE U-SHAPE 47X51 STRL (DRAPES) ×3 IMPLANT
DRSG ADAPTIC 3X8 NADH LF (GAUZE/BANDAGES/DRESSINGS) ×3 IMPLANT
DRSG AQUACEL AG ADV 3.5X10 (GAUZE/BANDAGES/DRESSINGS) ×3 IMPLANT
DRSG PAD ABDOMINAL 8X10 ST (GAUZE/BANDAGES/DRESSINGS) ×6 IMPLANT
DURAPREP 26ML APPLICATOR (WOUND CARE) ×3 IMPLANT
ELECT CAUTERY BLADE 6.4 (BLADE) ×3 IMPLANT
ELECT REM PT RETURN 9FT ADLT (ELECTROSURGICAL) ×3
ELECTRODE REM PT RTRN 9FT ADLT (ELECTROSURGICAL) ×1 IMPLANT
EVACUATOR 1/8 PVC DRAIN (DRAIN) IMPLANT
FACESHIELD WRAPAROUND (MASK) ×3 IMPLANT
GAUZE SPONGE 4X4 12PLY STRL (GAUZE/BANDAGES/DRESSINGS) ×3 IMPLANT
GLOVE BIO SURGEON STRL SZ7 (GLOVE) ×3 IMPLANT
GLOVE BIOGEL PI IND STRL 7.0 (GLOVE) ×1 IMPLANT
GLOVE BIOGEL PI IND STRL 7.5 (GLOVE) ×1 IMPLANT
GLOVE BIOGEL PI INDICATOR 7.0 (GLOVE) ×2
GLOVE BIOGEL PI INDICATOR 7.5 (GLOVE) ×2
GLOVE SS BIOGEL STRL SZ 7.5 (GLOVE) ×1 IMPLANT
GLOVE SUPERSENSE BIOGEL SZ 7.5 (GLOVE) ×2
GOWN STRL REUS W/ TWL LRG LVL3 (GOWN DISPOSABLE) ×2 IMPLANT
GOWN STRL REUS W/ TWL XL LVL3 (GOWN DISPOSABLE) ×3 IMPLANT
GOWN STRL REUS W/TWL LRG LVL3 (GOWN DISPOSABLE) ×4
GOWN STRL REUS W/TWL XL LVL3 (GOWN DISPOSABLE) ×6
HANDPIECE INTERPULSE COAX TIP (DISPOSABLE) ×2
HOOD PEEL AWAY FACE SHEILD DIS (HOOD) ×6 IMPLANT
IMMOBILIZER KNEE 22 UNIV (SOFTGOODS) IMPLANT
KIT BASIN OR (CUSTOM PROCEDURE TRAY) ×3 IMPLANT
KIT ROOM TURNOVER OR (KITS) ×3 IMPLANT
MANIFOLD NEPTUNE II (INSTRUMENTS) ×3 IMPLANT
NEEDLE SPNL 18GX3.5 QUINCKE PK (NEEDLE) IMPLANT
NS IRRIG 1000ML POUR BTL (IV SOLUTION) ×3 IMPLANT
PACK BLADE SAW RECIP 70 3 PT (BLADE) ×3 IMPLANT
PACK TOTAL JOINT (CUSTOM PROCEDURE TRAY) ×3 IMPLANT
PACK UNIVERSAL I (CUSTOM PROCEDURE TRAY) ×3 IMPLANT
PAD ARMBOARD 7.5X6 YLW CONV (MISCELLANEOUS) ×6 IMPLANT
PAD CAST 4YDX4 CTTN HI CHSV (CAST SUPPLIES) IMPLANT
PADDING CAST ABS 4INX4YD NS (CAST SUPPLIES) ×2
PADDING CAST ABS 6INX4YD NS (CAST SUPPLIES) ×2
PADDING CAST ABS COTTON 4X4 ST (CAST SUPPLIES) ×1 IMPLANT
PADDING CAST ABS COTTON 6X4 NS (CAST SUPPLIES) ×1 IMPLANT
PADDING CAST COTTON 4X4 STRL (CAST SUPPLIES)
PADDING CAST COTTON 6X4 STRL (CAST SUPPLIES) ×3 IMPLANT
RUBBERBAND STERILE (MISCELLANEOUS) ×3 IMPLANT
SET HNDPC FAN SPRY TIP SCT (DISPOSABLE) ×1 IMPLANT
STRIP CLOSURE SKIN 1/2X4 (GAUZE/BANDAGES/DRESSINGS) ×2 IMPLANT
SUCTION FRAZIER HANDLE 10FR (MISCELLANEOUS) ×4
SUCTION TUBE FRAZIER 10FR DISP (MISCELLANEOUS) ×2 IMPLANT
SUT ETHIBOND NAB CT1 #1 30IN (SUTURE) IMPLANT
SUT MNCRL AB 3-0 PS2 18 (SUTURE) ×3 IMPLANT
SUT VIC AB 0 CT1 27 (SUTURE) ×4
SUT VIC AB 0 CT1 27XBRD ANBCTR (SUTURE) ×2 IMPLANT
SUT VIC AB 1 CT1 27 (SUTURE) ×2
SUT VIC AB 1 CT1 27XBRD ANBCTR (SUTURE) ×1 IMPLANT
SUT VIC AB 2-0 CT1 27 (SUTURE) ×4
SUT VIC AB 2-0 CT1 TAPERPNT 27 (SUTURE) ×2 IMPLANT
TOWEL OR 17X24 6PK STRL BLUE (TOWEL DISPOSABLE) ×3 IMPLANT
TOWEL OR 17X26 10 PK STRL BLUE (TOWEL DISPOSABLE) ×3 IMPLANT
TRAY FOLEY CATH 16FRSI W/METER (SET/KITS/TRAYS/PACK) ×3 IMPLANT
WATER STERILE IRR 1000ML POUR (IV SOLUTION) ×3 IMPLANT

## 2015-12-18 NOTE — Transfer of Care (Signed)
Immediate Anesthesia Transfer of Care Note  Patient: [name]  Procedure(s) Performed: Procedure(s): UNICOMPARTMENTAL RIGHT KNEE (Right)  Patient Location: PACU  Anesthesia Type:Spinal and MAC combined with regional for post-op pain  Level of Consciousness: awake, alert , oriented and patient cooperative  Airway & Oxygen Therapy: Patient Spontanous Breathing and Patient connected to nasal cannula oxygen  Post-op Assessment: Report given to RN and Post -op Vital signs reviewed and stable  Post vital signs: Reviewed and stable  Last Vitals:  Filed Vitals:   12/06/15 1500 12/18/15 0616  BP: 129/76 136/88  Pulse: 73 72  Temp: 36.4 C 36.4 C  Resp:  20    Complications: No apparent anesthesia complications

## 2015-12-18 NOTE — Anesthesia Procedure Notes (Addendum)
Spinal Patient location during procedure: OR End time: 12/18/2015 7:24 AM Staffing Anesthesiologist: Annye Asa Performed by: anesthesiologist  Preanesthetic Checklist Completed: patient identified, surgical consent, pre-op evaluation, timeout performed, IV checked, risks and benefits discussed and monitors and equipment checked Spinal Block Patient position: sitting Prep: Betadine, ChloraPrep and site prepped and draped Patient monitoring: heart rate, cardiac monitor, continuous pulse ox and blood pressure Approach: midline Location: L3-4 Injection technique: single-shot Needle Needle type: Quincke  Needle gauge: 25 G Needle length: 9 cm Additional Notes Pt identified in Operating room.  Monitors applied. Working IV access confirmed. Sterile prep, drape lumbar spine.  1% lido local L 3,4.  #25ga Quincke into clear CSF L 3,4.  13.'5mg'$  0.75% Bupivacaine with dextrose injected with asp CSF beginning and end of injection.  Patient asymptomatic, VSS, no heme aspirated, tolerated well.  Jenita Seashore, MD   Anesthesia Regional Block:  Adductor canal block  Pre-Anesthetic Checklist: ,, timeout performed, Correct Patient, Correct Site, Correct Laterality, Correct Procedure, Correct Position, site marked, Risks and benefits discussed,  Surgical consent,  Pre-op evaluation,  At surgeon's request and post-op pain management  Laterality: Right and Lower  Prep: chloraprep       Needles:  Injection technique: Single-shot  Needle Type: Echogenic Needle     Needle Length: 10cm 10 cm Needle Gauge: 22 and 22 G    Additional Needles:  Procedures: ultrasound guided (picture in chart) Adductor canal block Narrative:  Start time: 12/18/2015 7:09 AM End time: 12/18/2015 7:14 AM Injection made incrementally with aspirations every 5 mL.  Performed by: Personally  Anesthesiologist: Glennon Mac, Newt Levingston  Additional Notes: Pt identified in Holding room.  Monitors applied. Working IV access  confirmed. Sterile prep.  #22ga ECHOgenic needle into adductor canal with US guidance.  30cc 0.5% Bupivacaine with 1:200k epi injected incrementally after negative test dose.  Patient asymptomatic, VSS, no heme aspirated, tolerated well.  Jenita Seashore, MD

## 2015-12-18 NOTE — Interval H&P Note (Signed)
History and Physical Interval Note:  12/18/2015 7:01 AM  [name]  has presented today for surgery, with the diagnosis of PRIMARY LOCALIZED OA RIGHT KNEE  The various methods of treatment have been discussed with the patient and family. After consideration of risks, benefits and other options for treatment, the patient has consented to  Procedure(s): UNICOMPARTMENTAL RIGHT KNEE (Right) as a surgical intervention .  The patient's history has been reviewed, patient examined, no change in status, stable for surgery.  I have reviewed the patient's chart and labs.  Questions were answered to the patient's satisfaction.     Elsie Saas A

## 2015-12-18 NOTE — Progress Notes (Signed)
Orthopedic Tech Progress Note Patient Details:  ZIANNE SCHUBRING 05-07-1941 [id number]  CPM Right Knee CPM Right Knee: On Right Knee Flexion (Degrees): 90 Right Knee Extension (Degrees): 0 Additional Comments: trapeze bar patient helper Viewed order from doctor's order list  Hildred Priest 12/18/2015, 10:04 AM

## 2015-12-18 NOTE — Anesthesia Preprocedure Evaluation (Addendum)
Anesthesia Evaluation  Patient identified by MRN, date of birth, ID band Patient awake    Reviewed: Allergy & Precautions, NPO status , Patient's Chart, lab work & pertinent test results, reviewed documented beta blocker date and time   History of Anesthesia Complications Negative for: history of anesthetic complications  Airway Mallampati: II  TM Distance: >3 FB Neck ROM: Full    Dental  (+) Edentulous Upper, Partial Lower, Dental Advisory Given   Pulmonary COPD, Current Smoker,    breath sounds clear to auscultation       Cardiovascular hypertension, Pt. on medications and Pt. on home beta blockers (-) angina Rhythm:Regular Rate:Normal  3/17 stress:  LV systolic function was normal without regional wall motion abnormalities. LVEF was 63%.     Neuro/Psych negative neurological ROS     GI/Hepatic Neg liver ROS, GERD  Controlled,  Endo/Other  negative endocrine ROS  Renal/GU negative Renal ROS     Musculoskeletal  (+) Arthritis , Osteoarthritis,    Abdominal   Peds  Hematology negative hematology ROS (+)   Anesthesia Other Findings   Reproductive/Obstetrics                           Anesthesia Physical Anesthesia Plan  ASA: II  Anesthesia Plan: Regional and Spinal   Post-op Pain Management:  Regional for Post-op pain   Induction:   Airway Management Planned: Natural Airway and Nasal Cannula  Additional Equipment:   Intra-op Plan:   Post-operative Plan:   Informed Consent: I have reviewed the patients History and Physical, chart, labs and discussed the procedure including the risks, benefits and alternatives for the proposed anesthesia with the patient or authorized representative who has indicated his/her understanding and acceptance.   Dental advisory given  Plan Discussed with: CRNA and Surgeon  Anesthesia Plan Comments: (Plan routine monitors, SAB with adductor canal  block for post op analgesia)        Anesthesia Quick Evaluation

## 2015-12-18 NOTE — Anesthesia Postprocedure Evaluation (Signed)
Anesthesia Post Note  Patient: [name]  Procedure(s) Performed: Procedure(s) (LRB): UNICOMPARTMENTAL RIGHT KNEE (Right)  Patient location during evaluation: PACU Anesthesia Type: General and Regional Level of consciousness: awake and alert, oriented and patient cooperative Pain management: pain level controlled Vital Signs Assessment: post-procedure vital signs reviewed and stable Respiratory status: spontaneous breathing, nonlabored ventilation and respiratory function stable Cardiovascular status: blood pressure returned to baseline and stable Postop Assessment: no headache, no backache, spinal receding and no signs of nausea or vomiting Anesthetic complications: no    Last Vitals:  Filed Vitals:   12/18/15 1024 12/18/15 1026  BP:  112/62  Pulse:  69  Temp: 36.1 C   Resp: 17 16    Last Pain:  Filed Vitals:   12/18/15 1031  PainSc: 3                  Arabelle Bollig,E. Charda Janis

## 2015-12-19 ENCOUNTER — Encounter (HOSPITAL_COMMUNITY): Payer: Self-pay | Admitting: Orthopedic Surgery

## 2015-12-19 LAB — BASIC METABOLIC PANEL
Anion gap: 10 (ref 5–15)
BUN: 11 mg/dL (ref 6–20)
CALCIUM: 8.6 mg/dL — AB (ref 8.9–10.3)
CHLORIDE: 105 mmol/L (ref 101–111)
CO2: 20 mmol/L — ABNORMAL LOW (ref 22–32)
CREATININE: 0.7 mg/dL (ref 0.44–1.00)
Glucose, Bld: 155 mg/dL — ABNORMAL HIGH (ref 65–99)
Potassium: 4.2 mmol/L (ref 3.5–5.1)
SODIUM: 135 mmol/L (ref 135–145)

## 2015-12-19 LAB — CBC
HCT: 35.6 % — ABNORMAL LOW (ref 36.0–46.0)
HEMOGLOBIN: 12.1 g/dL (ref 12.0–15.0)
MCH: 32.3 pg (ref 26.0–34.0)
MCHC: 34 g/dL (ref 30.0–36.0)
MCV: 94.9 fL (ref 78.0–100.0)
Platelets: 191 10*3/uL (ref 150–400)
RBC: 3.75 MIL/uL — ABNORMAL LOW (ref 3.87–5.11)
RDW: 13 % (ref 11.5–15.5)
WBC: 22.9 10*3/uL — ABNORMAL HIGH (ref 4.0–10.5)

## 2015-12-19 MED ORDER — ASPIRIN 325 MG PO TBEC: DELAYED_RELEASE_TABLET | ORAL | Status: DC

## 2015-12-19 MED ORDER — HYDROMORPHONE HCL 2 MG PO TABS: ORAL_TABLET | ORAL | Status: DC

## 2015-12-19 MED ORDER — POLYETHYLENE GLYCOL 3350 17 G PO PACK: PACK | ORAL | Status: DC

## 2015-12-19 MED ORDER — DOCUSATE SODIUM 100 MG PO CAPS: ORAL_CAPSULE | ORAL | Status: DC

## 2015-12-19 NOTE — Progress Notes (Signed)
Reviewed discharge instructions/medications with patient and patient's husband.  They had no questions. Thanks. Ilene Qua 10:21 AM 12/19/2015

## 2015-12-19 NOTE — Op Note (Signed)
[name], [name] NO.:  [id number]  MEDICAL RECORD NO.:  [id number]  LOCATION:  [id number]                        FACILITY:  [facility]  PHYSICIAN:  [name], M.D. DATE OF BIRTH:  08/31/1941  DATE OF PROCEDURE:  12/18/2015 DATE OF DISCHARGE:                              OPERATIVE REPORT   PREOPERATIVE DIAGNOSIS:  Right knee medial compartment, primary localized osteoarthritis.  POSTOPERATIVE DIAGNOSIS:  Right knee medial compartment, primary localized osteoarthritis.  PROCEDURE:  Right knee unicompartmental arthroplasty using Oxford cemented unicompartmental system with a size small cemented femur with size B cemented tibia with #3 polyethylene tibial insert.  SURGEON:  [name], M.D.  ASSISTANT:  Crystal Shepperson, PA-C.  ANESTHESIA:  Spinal .  OPERATIVE TIME:  1 hour and 15 minutes.  COMPLICATIONS:  None.  DESCRIPTION OF PROCEDURE:  [name] was brought to the operating room on December 18, 2015, after an adductor canal block was placed in the holding room by Anesthesia.  She was placed on operative table in supine position.  After being placed under spinal anesthesia successfully, she was then placed supine.  Her right knee was examined.  Moderate varus deformity, full range of motion, knee was stable with normal patellar tracking.  The right leg was prepped using sterile DuraPrep and draped using sterile technique.  Time-out procedure was called and the correct right knee identified.  The right leg was exsanguinated and tourniquet elevated 350 mm.  Initially through a 5 cm longitudinal incision medial to the patella and the patellar tendon, initial exposure was made.  The underlying subcutaneous tissues were incised along with skin incision. Median arthrotomy was performed revealing an excessive amount of normal- appearing joint fluid.  The articular surfaces were inspected.  She had grade 4 changes medially, minimal articular  cartilage changes in the patellofemoral joint, also on the lateral compartment.  The ACL and PCL were normal.  After this was done, then using a small sizing spoon and the tibial cutting jig, the proximal tibial cut was made using standard technique without complications.  MCL was protected during this procedure.  After this was done, then the medial meniscus was resected. Intramedullary drill was then drilled up the femoral canal and the appropriate position to the placement of the IM rod and this was linked to the jig for placing 2 holes in the femur when it was linked to the IMR in excellent position.  At this point, then the posterior femoral cutting guide was placed on the distal femur and this cut was made as well.  After this was done, the proximal tibia was sized.  Size B was found to be appropriate size and then a 0 Spigot was used to ream the distal femur.  At this point, then with a small femoral guide and the size B tibial guide, these were placed and then a #3 tibial spacer was placed in flexion which gave excellent stability.  In full extension, there was no gap and size 3 Spigot was used to re-ream the distal femur. After this was done, the trial components were replaced with excellent position and then the #3 trial spacer was placed, flexion and extension gave excellent stability and excellent position.  At this point, then the keel cut was made on the proximal tibia using the cutting jig and the impingement the cutting guide was placed on the distal femur and these cuts were made.  After this was done, then the trials were replaced with a #3 tibial spacer.  There was found to be excellent stability of all components with full range of motion with the #3 tibial spacer.  At this point, felt that all trial components were of excellent size, fit, and stability.  They were then removed.  The knee was jet lavaged, irrigated with saline, and then the proximal tibia was  exposed and the size B tibial component with cement backing was hammered in position with an excellent fit with excess cement being removed from around the edges.  The small femoral component with cement backing was hammered in position also with an excellent fit with excess cement being removed from around the edges.  The #3 tibial spacer was then placed with excellent stability and the knee brought through full range of motion and found to be stable with excellent correction of varus deformity and full range of motion and excellent stability. Intraoperative fluoroscopy confirmed this.  At this point, felt that all the components were of excellent size, fit, and stability.  The wound was further irrigated with saline and then the arthrotomy was closed with #1 Ethibond suture.  Subcutaneous tissues closed with 0 and 2-0 Vicryl, subcuticular closed with 4-0 Monocryl.  The wound injected with 0.25% Marcaine.  Sterile dressings were applied and a long-leg splint applied.  The patient awakened and taken to recovery room in stable condition.  Needle, sponge counts correct x2 at the end the case.     [name], M.D.     RAW/MEDQ  D:  12/18/2015  T:  12/19/2015  Job:  [id number]

## 2015-12-19 NOTE — Evaluation (Signed)
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  Clinical Impression  Pt is s/p TKA resulting in the deficits listed below (see PT Problem List).  Pt will benefit from skilled PT to increase their independence and safety with mobility to allow discharge to the venue listed below.      Follow Up Recommendations Home health PT;Supervision - Intermittent    Equipment Recommendations  None recommended by PT    Recommendations for Other Services       Precautions / Restrictions Precautions Precautions: Knee Precaution Booklet Issued: Yes (comment) Precaution Comments: Pt educated to not allow any pillow or bolster under knee for healing with optimal range of motion.  Restrictions RLE Weight Bearing: Weight bearing as tolerated      Mobility  Bed Mobility                  Transfers Overall transfer level: Needs assistance Equipment used: Rolling walker (2 wheeled) Transfers: Sit to/from Stand Sit to Stand: Supervision         General transfer comment: Cues for hand placement and safety; very good rise  Ambulation/Gait Ambulation/Gait assistance: Supervision Ambulation Distance (Feet): 260 Feet Assistive device: Rolling walker (2 wheeled) Gait Pattern/deviations: Step-through pattern     General Gait Details: Cues to activate R quad for stance stability  Stairs Stairs: Yes Stairs assistance: Supervision Stair Management: No rails;With walker;Forwards;Backwards;Step to pattern Number of Stairs: 1 (x2) General stair comments: Cues for gait sequence  Wheelchair Mobility    Modified Rankin (Stroke Patients Only)        Balance                                             Pertinent Vitals/Pain Pain Assessment: 0-10 Pain Score: 5  Pain Location: R knee with therex Pain Descriptors / Indicators: Aching;Sore Pain Intervention(s): Limited activity within patient's tolerance;Monitored during session    [place] expects to be discharged to:: Private residence Living Arrangements: Spouse/significant other;Other relatives Available Help at Discharge: Family;Available 24 hours/day Type of Home: House Home Access: Stairs to enter Entrance Stairs-Rails: None Entrance Stairs-Number of Steps: 1 Home Layout: One level Home Equipment: Walker - 2 wheels;Bedside commode      Prior Function Level of Independence: Independent with assistive device(s)         Comments: Had been using a RW for a few weeks leading up to TKA     Hand Dominance        Extremity/Trunk Assessment   Upper Extremity Assessment: Overall WFL for tasks assessed           Lower Extremity Assessment: RLE deficits/detail RLE Deficits / Details: Painful with ROM, baut able to actively flex to approx 85deg, good quad set, with excellent straight leg raises       Communication   Communication: No difficulties  Cognition Arousal/Alertness: Awake/alert Behavior During Therapy: WFL for tasks assessed/performed Overall Cognitive Status: Within Functional Limits for tasks assessed                      General Comments  Exercises Total Joint Exercises Quad Sets: AROM;Right;15 reps Heel Slides: AROM;Right;5 reps Straight Leg Raises: AROM;Right;10 reps Long Arc Quad: AROM;Right;5 reps Knee Flexion: AROM;Right;5 reps;Seated      Assessment/Plan    PT Assessment Patient needs continued PT services  PT Diagnosis Difficulty walking;Acute pain   PT Problem List Decreased strength;Decreased range of motion;Decreased activity tolerance;Decreased balance;Decreased  mobility;Decreased knowledge of use of DME;Pain;Decreased knowledge of precautions  PT Treatment Interventions DME instruction;Gait training;Stair training;Functional mobility training;Therapeutic activities;Therapeutic exercise;Patient/family education   PT Goals (Current goals can be found in the Care Plan section) Acute Rehab PT Goals Patient Stated Goal: back to independence PT Goal Formulation: With patient Time For Goal Achievement: 12/26/15 Potential to Achieve Goals: Good    Frequency 7X/week   Barriers to discharge        Co-evaluation               End of Session Equipment Utilized During Treatment: Gait belt Activity Tolerance: Patient tolerated treatment well Patient left: in chair;with call bell/phone within reach;Other (comment) (Sitting at sink, prepping to wash up) Nurse Communication: Mobility status;Other (comment) (told NT she is washing up at sink)         Time: 5465-6812 PT Time Calculation (min) (ACUTE ONLY): 25 min   Charges:   PT Evaluation $PT Eval Moderate Complexity: 1 Procedure PT Treatments $Gait Training: 8-22 mins   PT G Codes:        Quin Hoop 12/19/2015, 9:27 AM  Roney Marion, [place] Pager [phone] Office [phone]

## 2015-12-19 NOTE — Care Management Note (Signed)
Case Management Note  Patient Details  Name: ESCARLET SAATHOFF MRN: [id number] Date of Birth: [birth year]  Subjective/Objective:        S/p right total knee arthroplasty            Action/Plan: Set up with [facility] for HHPT by MD office. Spoke with patient and her husband, no change in discharge plan. Patient's husband will be assisting after discharge. [employer] will deliver CPM to home, patient already had rolling walker and 3N1,   Expected Discharge Date:                  Expected Discharge Plan:  [place]  In-House Referral:  NA  Discharge planning Services  CM Consult  Post Acute Care Choice:  Durable Medical Equipment, Home Health Choice offered to:  Patient  DME Arranged:  CPM DME Agency:  [employer]  HH Arranged:  PT HH Agency:  [facility]  Status of Service:  Completed, signed off  Medicare Important Message Given:    Date Medicare IM Given:    Medicare IM give by:    Date Additional Medicare IM Given:    Additional Medicare Important Message give by:     If discussed at [place] of Stay Meetings, dates discussed:    Additional Comments:  Nila Nephew, RN 12/19/2015, 12:18 PM

## 2015-12-19 NOTE — Discharge Summary (Signed)
Patient ID: [name] MRN: [id number] DOB/AGE: Oct 13, 1940 75 y.o.  Admit date: 12/18/2015 Discharge date: 12/19/2015  Admission Diagnoses:  Principal Problem:   Primary localized osteoarthritis of right knee Active Problems:   Cardiac conduction disorder   CN (constipation)   DD (diverticular disease)   Hemorrhoid   BP (high blood pressure)   Blood glucose elevated   HLD (hyperlipidemia)   Osteopenia   Discharge Diagnoses:  Same  Past Medical History  Diagnosis Date  . 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 CN (constipation) 12/06/2015  . DD (diverticular disease) 12/06/2015    Overview:  Dr Benson Norway  Last Assessment & Plan:  Relevant Hx: Course: Daily Update: Today's Plan:   . Hemorrhoid 12/06/2015  . BP (high blood pressure) 12/03/2011  . Blood glucose elevated 05/27/2012  . HLD (hyperlipidemia) 12/03/2011  . Osteopenia 05/30/2015    Overview:  Bone density 05/2015 started fosamax   . Primary localized osteoarthritis of right knee 12/06/2015  . GERD (gastroesophageal reflux disease)   . History of hiatal hernia   . Cancer [facility])     skin cancer    Surgeries: Procedure(s): UNICOMPARTMENTAL RIGHT KNEE on 12/18/2015   Consultants:    Discharged Condition: Improved  Hospital Course: [name] is an 75 y.o. female who was admitted 12/18/2015 for operative treatment ofPrimary localized osteoarthritis of right knee. Patient has severe unremitting pain that affects sleep, daily activities, and work/hobbies. After pre-op clearance the patient was taken to the operating room on 12/18/2015 and underwent  Procedure(s): UNICOMPARTMENTAL RIGHT KNEE.    Patient was given perioperative antibiotics: Anti-infectives    Start     Dose/Rate Route Frequency Ordered Stop   12/18/15 1300  ceFAZolin (ANCEF) IVPB 2g/100 mL premix     2 g 200  mL/hr over 30 Minutes Intravenous Every 6 hours 12/18/15 1048 12/18/15 2110   12/18/15 0700  ceFAZolin (ANCEF) IVPB 2g/100 mL premix     2 g 200 mL/hr over 30 Minutes Intravenous To [facility]Stay [facility] 12/15/15 0805 12/18/15 0754       Patient was given sequential compression devices, early ambulation, and chemoprophylaxis to prevent DVT.  Patient benefited maximally from hospital stay and there were no complications.    Recent vital signs: Patient Vitals for the past 24 hrs:  BP Temp Temp src Pulse Resp SpO2  12/19/15 0618 131/66 mmHg 98.2 F (36.8 C) - 88 - 96 %  12/19/15 0153 (!) 125/58 mmHg - - 66 - 94 %  12/18/15 2200 (!) 114/53 mmHg 97.7 F (36.5 C) Oral 65 15 94 %  12/18/15 1700 114/62 mmHg 97.1 F (36.2 C) Oral 72 16 100 %  12/18/15 1058 (!) 116/50 mmHg - Oral 70 18 100 %  12/18/15 1049 - - - - - 100 %  12/18/15 1026 112/62 mmHg - - 69 16 99 %  12/18/15 1024 - (!) 96.9 F (36.1 C) - - 17 99 %  12/18/15 1015 - - - 68 17 97 %  12/18/15 1001 121/76 mmHg - - - - -  12/18/15 1000 121/76 mmHg - - 70 17 99 %  12/18/15 0945 (!) 95/46 mmHg - - 72 18 92 %  12/18/15 0934 - 97.6 F (36.4 C) - 72 13 95 %     Recent laboratory studies:  Recent Labs  12/19/15 0649  WBC 22.9*  HGB 12.1  HCT 35.6*  PLT 191  NA 135  K 4.2  CL 105  CO2 20*  BUN 11  CREATININE 0.70  GLUCOSE 155*  CALCIUM 8.6*     Discharge Medications:     Medication List    STOP taking these medications        alendronate 70 MG tablet  Commonly known as:  FOSAMAX      TAKE these medications        acetaminophen 500 MG tablet  Commonly known as:  TYLENOL  Take 1,000 mg by mouth 4 (four) times daily as needed for moderate pain.     aspirin 325 MG EC tablet  1 tab a day for the next 30 days to prevent blood clots     calcium carbonate 500 MG chewable tablet  Commonly known as:  TUMS - dosed in mg elemental calcium  Chew 1 tablet by mouth daily.     cetirizine 10 MG tablet  Commonly known  as:  ZYRTEC  Take 10 mg by mouth daily as needed for allergies.     diclofenac sodium 1 % Gel  Commonly known as:  VOLTAREN  Apply 2-4 g topically daily as needed (for arthritis pain). Reported on 12/07/2015     docusate sodium 100 MG capsule  Commonly known as:  COLACE  1 tab 2 times a day while on narcotics.  STOOL SOFTENER     HYDROmorphone 2 MG tablet  Commonly known as:  DILAUDID  1-2 tablets every 4-6 hrs as needed for pain     meloxicam 15 MG tablet  Commonly known as:  MOBIC  Take 15 mg by mouth daily as needed for pain (for inflammation or pain).     metoprolol succinate 50 MG 24 hr tablet  Commonly known as:  TOPROL-XL  Take 50 mg by mouth daily. Take with or immediately following a meal.     multivitamin with minerals Tabs tablet  Take 1 tablet by mouth daily.     polyethylene glycol packet  Commonly known as:  MIRALAX / GLYCOLAX  17grams in 6 oz of water twice a day until bowel movement.  LAXITIVE.  Restart if two days since last bowel movement     simvastatin 20 MG tablet  Commonly known as:  ZOCOR  Take 20 mg by mouth daily.     UNABLE TO FIND  Apply 1-2 g topically 4 (four) times daily as needed (for arthritis pain). Diclofenac 0.15%, Lidocaine 2.25%, Prilocaine 2.25% cream        Diagnostic Studies: No results found.  Disposition: Final discharge disposition not confirmed      Discharge Instructions    CPM    Complete by:  As directed   Continuous passive motion machine (CPM):      Use the CPM from 0 to 90 for 6 hours per day.       You may break it up into 2 or 3 sessions per day.      Use CPM for 2 weeks or until you are told to stop.     Call MD / Call 911    Complete by:  As directed   If you experience chest pain or shortness of breath, CALL 911 and be transported to the hospital emergency room.  If you develope a fever above 101 F, pus (white drainage) or increased drainage or redness at the wound, or calf pain, call your surgeon's office.      Change dressing  Complete by:  As directed   Change the gauze dressing daily with sterile 4 x 4 inch gauze and apply TED hose.  DO NOT REMOVE BANDAGE OVER SURGICAL INCISION.  Kenneth WHOLE LEG INCLUDING OVER THE WATERPROOF BANDAGE WITH SOAP AND WATER EVERY DAY.     Constipation Prevention    Complete by:  As directed   Drink plenty of fluids.  Prune juice may be helpful.  You may use a stool softener, such as Colace (over the counter) 100 mg twice a day.  Use MiraLax (over the counter) for constipation as needed.     Diet - low sodium heart healthy    Complete by:  As directed      Discharge instructions    Complete by:  As directed   INSTRUCTIONS AFTER JOINT REPLACEMENT   Remove items at home which could result in a fall. This includes throw rugs or furniture in walking pathways ICE to the affected joint every three hours while awake for 30 minutes at a time, for at least the first 3-5 days, and then as needed for pain and swelling.  Continue to use ice for pain and swelling. You may notice swelling that will progress down to the foot and ankle.  This is normal after surgery.  Elevate your leg when you are not up walking on it.   Continue to use the breathing machine you got in the hospital (incentive spirometer) which will help keep your temperature down.  It is common for your temperature to cycle up and down following surgery, especially at night when you are not up moving around and exerting yourself.  The breathing machine keeps your lungs expanded and your temperature down.   DIET:  As you were doing prior to hospitalization, we recommend a well-balanced diet.  DRESSING / WOUND CARE / SHOWERING  Keep the surgical dressing until follow up.  The dressing is water proof, so you can shower without any extra covering.  IF THE DRESSING FALLS OFF or the wound gets wet inside, change the dressing with sterile gauze.  Please use good hand washing techniques before changing the dressing.  Do not  use any lotions or creams on the incision until instructed by your surgeon.    ACTIVITY  Increase activity slowly as tolerated, but follow the weight bearing instructions below.   No driving for 6 weeks or until further direction given by your physician.  You cannot drive while taking narcotics.  No lifting or carrying greater than 10 lbs. until further directed by your surgeon. Avoid periods of inactivity such as sitting longer than an hour when not asleep. This helps prevent blood clots.  You may return to work once you are authorized by your doctor.     WEIGHT BEARING   Weight bearing as tolerated with assist device (walker, cane, etc) as directed, use it as long as suggested by your surgeon or therapist, typically at least 2-3 weeks.   EXERCISES  Results after joint replacement surgery are often greatly improved when you follow the exercise, range of motion and muscle strengthening exercises prescribed by your doctor. Safety measures are also important to protect the joint from further injury. Any time any of these exercises cause you to have increased pain or swelling, decrease what you are doing until you are comfortable again and then slowly increase them. If you have problems or questions, call your caregiver or physical therapist for advice.   Rehabilitation is important following a joint replacement. After just  a few days of immobilization, the muscles of the leg can become weakened and shrink (atrophy).  These exercises are designed to build up the tone and strength of the thigh and leg muscles and to improve motion. Often times heat used for twenty to thirty minutes before working out will loosen up your tissues and help with improving the range of motion but do not use heat for the first two weeks following surgery (sometimes heat can increase post-operative swelling).   These exercises can be done on a training (exercise) mat, on the floor, on a table or on a bed. Use whatever  works the best and is most comfortable for you.    Use music or television while you are exercising so that the exercises are a pleasant break in your day. This will make your life better with the exercises acting as a break in your routine that you can look forward to.   Perform all exercises about fifteen times, three times per day or as directed.  You should exercise both the operative leg and the other leg as well.   Exercises include:  Quad Sets - Tighten up the muscle on the front of the thigh (Quad) and hold for 5-10 seconds.   Straight Leg Raises - With your knee straight (if you were given a brace, keep it on), lift the leg to 60 degrees, hold for 3 seconds, and slowly lower the leg.  Perform this exercise against resistance later as your leg gets stronger.  Leg Slides: Lying on your back, slowly slide your foot toward your buttocks, bending your knee up off the floor (only go as far as is comfortable). Then slowly slide your foot back down until your leg is flat on the floor again.  Angel Wings: Lying on your back spread your legs to the side as far apart as you can without causing discomfort.  Hamstring Strength:  Lying on your back, push your heel against the floor with your leg straight by tightening up the muscles of your buttocks.  Repeat, but this time bend your knee to a comfortable angle, and push your heel against the floor.  You may put a pillow under the heel to make it more comfortable if necessary.   A rehabilitation program following joint replacement surgery can speed recovery and prevent re-injury in the future due to weakened muscles. Contact your doctor or a physical therapist for more information on knee rehabilitation.    CONSTIPATION  Constipation is defined medically as fewer than three stools per week and severe constipation as less than one stool per week.  Even if you have a regular bowel pattern at home, your normal regimen is likely to be disrupted due to multiple  reasons following surgery.  Combination of anesthesia, postoperative narcotics, change in appetite and fluid intake all can affect your bowels.   YOU MUST use at least one of the following options; they are listed in order of increasing strength to get the job done.  They are all available over the counter, and you may need to use some, POSSIBLY even all of these options:    Drink plenty of fluids (prune juice may be helpful) and high fiber foods Colace 100 mg by mouth twice a day  Senokot for constipation as directed and as needed Dulcolax (bisacodyl), take with full glass of water  Miralax (polyethylene glycol) once or twice a day as needed.  If you have tried all these things and are unable to have  a bowel movement in the first 3-4 days after surgery call either your surgeon or your primary doctor.    If you experience loose stools or diarrhea, hold the medications until you stool forms back up.  If your symptoms do not get better within 1 week or if they get worse, check with your doctor.  If you experience "the worst abdominal pain ever" or develop nausea or vomiting, please contact the office immediately for further recommendations for treatment.   ITCHING:  If you experience itching with your medications, try taking only a single pain pill, or even half a pain pill at a time.  You can also use Benadryl over the counter for itching or also to help with sleep.   TED HOSE STOCKINGS:  Use stockings on both legs until for at least 2 weeks or as directed by physician office. They may be removed at night for sleeping.  MEDICATIONS:  See your medication summary on the "After Visit Summary" that nursing will review with you.  You may have some home medications which will be placed on hold until you complete the course of blood thinner medication.  It is important for you to complete the blood thinner medication as prescribed.  PRECAUTIONS:  If you experience chest pain or shortness of breath - call  911 immediately for transfer to the hospital emergency department.   If you develop a fever greater that 101 F, purulent drainage from wound, increased redness or drainage from wound, foul odor from the wound/dressing, or calf pain - CONTACT YOUR SURGEON.                                                   FOLLOW-UP APPOINTMENTS:  If you do not already have a post-op appointment, please call the office for an appointment to be seen by your surgeon.  Guidelines for how soon to be seen are listed in your "After Visit Summary", but are typically between 1-4 weeks after surgery.  OTHER INSTRUCTIONS:   Knee Replacement:  Do not place pillow under knee, focus on keeping the knee straight while resting. CPM instructions: 0-90 degrees, 2 hours in the morning, 2 hours in the afternoon, and 2 hours in the evening. Place foam block, curve side up under heel at all times except when in CPM or when walking.  DO NOT modify, tear, cut, or change the foam block in any way.  MAKE SURE YOU:  Understand these instructions.  Get help right away if you are not doing well or get worse.    Thank you for letting us be a part of your medical care team.  It is a privilege we respect greatly.  We hope these instructions will help you stay on track for a fast and full recovery!     Do not put a pillow under the knee. Place it under the heel.    Complete by:  As directed   Place gray foam block, curve side up under heel at all times except when in CPM or when walking.  DO NOT modify, tear, cut, or change in any way the gray foam block.     Increase activity slowly as tolerated    Complete by:  As directed      Patient may shower    Complete by:  As directed   Aquacel dressing  is water proof    Wash over it and the whole leg with soap and water at the end of your shower     TED hose    Complete by:  As directed   Use stockings (TED hose) for 2 weeks on both leg(s).  You may remove them at night for sleeping.            Follow-up Information    Follow up with Lorn Junes, MD On 12/27/2015.   Specialty:  Orthopedic Surgery   Why:  appt time 3 pm   Contact information:   [address] [place] [address] [phone]        Signed: Linda Hedges 12/19/2015, 8:12 AM

## 2015-12-19 NOTE — Evaluation (Addendum)
Occupational Therapy Evaluation Patient Details Name: [name] MRN: [id number] DOB: 06-Feb-1941 Today's Date: 12/19/2015    History of Present Illness  Admitted for right TKA. 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   Clinical Impression   Pt s/p above. Education provided in session and feel pt is safe to d/c home, from OT standpoint. OT signing off.    Follow Up Recommendations  No OT follow up;Supervision - Intermittent    Equipment Recommendations  None recommended by OT    Recommendations for Other Services       Precautions / Restrictions Precautions Precautions: Knee Precaution Booklet Issued: No Restrictions Weight Bearing Restrictions: Yes RLE Weight Bearing: Weight bearing as tolerated      Mobility Bed Mobility               General bed mobility comments: not assessed  Transfers Overall transfer level: Needs assistance Transfers: Sit to/from Stand Sit to Stand: Supervision         General transfer comment: cue to have RW in front of pt for stand.    Balance    Min assist for ambulation without RW.                                        ADL Overall ADL's : Needs assistance/impaired                     Lower Body Dressing: Sit to/from stand;Set up;Supervision/safety   Toilet Transfer: Supervision/safety;Ambulation;BSC;RW (sit to stand from [facility])           Functional mobility during ADLs: Rolling walker (sit to stand from chair and [facility]; Supervision for ambulation with RW; Min assist for ambulation without RW). General ADL Comments: Educated on LB dressing technique. Educated on safety such as use of bag on walker and recommended someone be with her for shower transfer and bathing. Educated on shower transfer  techniques. Recommended her backing into shower using RW. Placed pt in bone foam at end of session.     Vision     Perception     Praxis      Pertinent Vitals/Pain Pain Assessment: 0-10 Pain Score: 8  Pain Location: right knee Pain Intervention(s): Monitored during session     Hand Dominance     Extremity/Trunk Assessment Upper Extremity Assessment Upper Extremity Assessment: Overall WFL for tasks assessed;Generalized weakness   Lower Extremity Assessment Lower Extremity Assessment: Defer to PT evaluation RLE Deficits / Details: Painful with ROM, baut able to actively flex to approx 85deg, good quad set, with excellent straight leg raises       Communication Communication Communication: No difficulties   Cognition Arousal/Alertness: Awake/alert Behavior During Therapy: WFL for tasks assessed/performed Overall Cognitive Status: Within Functional Limits for tasks assessed                     General Comments          Shoulder Instructions      Home Living Family/patient expects to be discharged to:: Private residence Living Arrangements: Spouse/significant other;Other relatives Available Help at Discharge: Family;Available 24 hours/day Type of Home: House Home Access: Stairs to enter [employer] of Steps: 1 Entrance Stairs-Rails: None Home Layout: One level  Bathroom Shower/Tub: Chief Strategy Officer: Environmental consultant - 2 wheels;Bedside commode;Hand held shower head          Prior Functioning/Environment Level of Independence: Needs assistance    ADL's / Homemaking Assistance Needed: assist with cooking and cleaning   Comments: Had been using a RW for a few weeks leading up to TKA    OT Diagnosis: Acute pain   OT Problem List: Decreased strength;Decreased range of motion;Decreased activity tolerance;Impaired balance (sitting and/or standing);Pain   OT Treatment/Interventions:      OT Goals(Current goals can be  found in the care plan section) Acute Rehab OT Goals Patient Stated Goal: to cook  OT Frequency:     Barriers to D/C:            Co-evaluation              End of Session Equipment Utilized During Treatment: Rolling walker CPM Right Knee CPM Right Knee: Off  Activity Tolerance: Patient tolerated treatment well Patient left: with call bell/phone within reach;with family/visitor present;in chair   Time: 9758-8325 OT Time Calculation (min): 12 min Charges:  OT General Charges $OT Visit: 1 Procedure OT Evaluation $OT Eval Moderate Complexity: 1 Procedure G-CodesBenito Mccreedy OTR/L [phone] 12/19/2015, 10:04 AM

## 2015-12-20 DIAGNOSIS — Z96651 Presence of right artificial knee joint: Secondary | ICD-10-CM | POA: Diagnosis not present

## 2015-12-20 DIAGNOSIS — I10 Essential (primary) hypertension: Secondary | ICD-10-CM | POA: Diagnosis not present

## 2015-12-20 DIAGNOSIS — Z471 Aftercare following joint replacement surgery: Secondary | ICD-10-CM | POA: Diagnosis not present

## 2015-12-20 DIAGNOSIS — I459 Conduction disorder, unspecified: Secondary | ICD-10-CM | POA: Diagnosis not present

## 2015-12-20 DIAGNOSIS — M8580 Other specified disorders of bone density and structure, unspecified site: Secondary | ICD-10-CM | POA: Diagnosis not present

## 2015-12-22 DIAGNOSIS — Z471 Aftercare following joint replacement surgery: Secondary | ICD-10-CM | POA: Diagnosis not present

## 2015-12-22 DIAGNOSIS — I459 Conduction disorder, unspecified: Secondary | ICD-10-CM | POA: Diagnosis not present

## 2015-12-22 DIAGNOSIS — I10 Essential (primary) hypertension: Secondary | ICD-10-CM | POA: Diagnosis not present

## 2015-12-22 DIAGNOSIS — M8580 Other specified disorders of bone density and structure, unspecified site: Secondary | ICD-10-CM | POA: Diagnosis not present

## 2015-12-22 DIAGNOSIS — Z96651 Presence of right artificial knee joint: Secondary | ICD-10-CM | POA: Diagnosis not present

## 2015-12-25 DIAGNOSIS — M8580 Other specified disorders of bone density and structure, unspecified site: Secondary | ICD-10-CM | POA: Diagnosis not present

## 2015-12-25 DIAGNOSIS — I10 Essential (primary) hypertension: Secondary | ICD-10-CM | POA: Diagnosis not present

## 2015-12-25 DIAGNOSIS — Z471 Aftercare following joint replacement surgery: Secondary | ICD-10-CM | POA: Diagnosis not present

## 2015-12-25 DIAGNOSIS — I459 Conduction disorder, unspecified: Secondary | ICD-10-CM | POA: Diagnosis not present

## 2015-12-25 DIAGNOSIS — Z96651 Presence of right artificial knee joint: Secondary | ICD-10-CM | POA: Diagnosis not present

## 2015-12-26 DIAGNOSIS — I459 Conduction disorder, unspecified: Secondary | ICD-10-CM | POA: Diagnosis not present

## 2015-12-26 DIAGNOSIS — Z96651 Presence of right artificial knee joint: Secondary | ICD-10-CM | POA: Diagnosis not present

## 2015-12-26 DIAGNOSIS — I10 Essential (primary) hypertension: Secondary | ICD-10-CM | POA: Diagnosis not present

## 2015-12-26 DIAGNOSIS — M8580 Other specified disorders of bone density and structure, unspecified site: Secondary | ICD-10-CM | POA: Diagnosis not present

## 2015-12-26 DIAGNOSIS — Z471 Aftercare following joint replacement surgery: Secondary | ICD-10-CM | POA: Diagnosis not present

## 2015-12-27 DIAGNOSIS — M25661 Stiffness of right knee, not elsewhere classified: Secondary | ICD-10-CM | POA: Diagnosis not present

## 2015-12-27 DIAGNOSIS — M25561 Pain in right knee: Secondary | ICD-10-CM | POA: Diagnosis not present

## 2015-12-27 DIAGNOSIS — S83241D Other tear of medial meniscus, current injury, right knee, subsequent encounter: Secondary | ICD-10-CM | POA: Diagnosis not present

## 2015-12-27 DIAGNOSIS — Z96651 Presence of right artificial knee joint: Secondary | ICD-10-CM | POA: Diagnosis not present

## 2015-12-27 DIAGNOSIS — M1711 Unilateral primary osteoarthritis, right knee: Secondary | ICD-10-CM | POA: Diagnosis not present

## 2015-12-29 DIAGNOSIS — M25661 Stiffness of right knee, not elsewhere classified: Secondary | ICD-10-CM | POA: Diagnosis not present

## 2015-12-29 DIAGNOSIS — M25561 Pain in right knee: Secondary | ICD-10-CM | POA: Diagnosis not present

## 2015-12-29 DIAGNOSIS — M1711 Unilateral primary osteoarthritis, right knee: Secondary | ICD-10-CM | POA: Diagnosis not present

## 2015-12-29 DIAGNOSIS — Z96651 Presence of right artificial knee joint: Secondary | ICD-10-CM | POA: Diagnosis not present

## 2016-01-01 DIAGNOSIS — M1711 Unilateral primary osteoarthritis, right knee: Secondary | ICD-10-CM | POA: Diagnosis not present

## 2016-01-02 DIAGNOSIS — Z96651 Presence of right artificial knee joint: Secondary | ICD-10-CM | POA: Diagnosis not present

## 2016-01-02 DIAGNOSIS — M25661 Stiffness of right knee, not elsewhere classified: Secondary | ICD-10-CM | POA: Diagnosis not present

## 2016-01-02 DIAGNOSIS — M25561 Pain in right knee: Secondary | ICD-10-CM | POA: Diagnosis not present

## 2016-01-02 DIAGNOSIS — M1711 Unilateral primary osteoarthritis, right knee: Secondary | ICD-10-CM | POA: Diagnosis not present

## 2016-01-04 DIAGNOSIS — M1711 Unilateral primary osteoarthritis, right knee: Secondary | ICD-10-CM | POA: Diagnosis not present

## 2016-01-04 DIAGNOSIS — M25561 Pain in right knee: Secondary | ICD-10-CM | POA: Diagnosis not present

## 2016-01-04 DIAGNOSIS — M25661 Stiffness of right knee, not elsewhere classified: Secondary | ICD-10-CM | POA: Diagnosis not present

## 2016-01-04 DIAGNOSIS — Z96651 Presence of right artificial knee joint: Secondary | ICD-10-CM | POA: Diagnosis not present

## 2016-01-10 DIAGNOSIS — M25561 Pain in right knee: Secondary | ICD-10-CM | POA: Diagnosis not present

## 2016-01-10 DIAGNOSIS — M1711 Unilateral primary osteoarthritis, right knee: Secondary | ICD-10-CM | POA: Diagnosis not present

## 2016-01-10 DIAGNOSIS — M25661 Stiffness of right knee, not elsewhere classified: Secondary | ICD-10-CM | POA: Diagnosis not present

## 2016-01-10 DIAGNOSIS — Z96651 Presence of right artificial knee joint: Secondary | ICD-10-CM | POA: Diagnosis not present

## 2016-01-12 DIAGNOSIS — M25561 Pain in right knee: Secondary | ICD-10-CM | POA: Diagnosis not present

## 2016-01-12 DIAGNOSIS — R262 Difficulty in walking, not elsewhere classified: Secondary | ICD-10-CM | POA: Diagnosis not present

## 2016-01-12 DIAGNOSIS — M1711 Unilateral primary osteoarthritis, right knee: Secondary | ICD-10-CM | POA: Diagnosis not present

## 2016-01-12 DIAGNOSIS — M25661 Stiffness of right knee, not elsewhere classified: Secondary | ICD-10-CM | POA: Diagnosis not present

## 2016-01-16 DIAGNOSIS — Z96651 Presence of right artificial knee joint: Secondary | ICD-10-CM | POA: Diagnosis not present

## 2016-01-16 DIAGNOSIS — M25661 Stiffness of right knee, not elsewhere classified: Secondary | ICD-10-CM | POA: Diagnosis not present

## 2016-01-16 DIAGNOSIS — M25561 Pain in right knee: Secondary | ICD-10-CM | POA: Diagnosis not present

## 2016-01-16 DIAGNOSIS — M1711 Unilateral primary osteoarthritis, right knee: Secondary | ICD-10-CM | POA: Diagnosis not present

## 2016-01-19 DIAGNOSIS — Z96651 Presence of right artificial knee joint: Secondary | ICD-10-CM | POA: Diagnosis not present

## 2016-01-19 DIAGNOSIS — M25661 Stiffness of right knee, not elsewhere classified: Secondary | ICD-10-CM | POA: Diagnosis not present

## 2016-01-19 DIAGNOSIS — M1711 Unilateral primary osteoarthritis, right knee: Secondary | ICD-10-CM | POA: Diagnosis not present

## 2016-01-19 DIAGNOSIS — M25561 Pain in right knee: Secondary | ICD-10-CM | POA: Diagnosis not present

## 2016-01-23 DIAGNOSIS — M25661 Stiffness of right knee, not elsewhere classified: Secondary | ICD-10-CM | POA: Diagnosis not present

## 2016-01-23 DIAGNOSIS — M1711 Unilateral primary osteoarthritis, right knee: Secondary | ICD-10-CM | POA: Diagnosis not present

## 2016-01-23 DIAGNOSIS — M25561 Pain in right knee: Secondary | ICD-10-CM | POA: Diagnosis not present

## 2016-01-23 DIAGNOSIS — Z96651 Presence of right artificial knee joint: Secondary | ICD-10-CM | POA: Diagnosis not present

## 2016-01-26 DIAGNOSIS — Z96651 Presence of right artificial knee joint: Secondary | ICD-10-CM | POA: Diagnosis not present

## 2016-01-26 DIAGNOSIS — M25561 Pain in right knee: Secondary | ICD-10-CM | POA: Diagnosis not present

## 2016-01-26 DIAGNOSIS — M1711 Unilateral primary osteoarthritis, right knee: Secondary | ICD-10-CM | POA: Diagnosis not present

## 2016-01-26 DIAGNOSIS — M25661 Stiffness of right knee, not elsewhere classified: Secondary | ICD-10-CM | POA: Diagnosis not present

## 2016-01-29 DIAGNOSIS — M1711 Unilateral primary osteoarthritis, right knee: Secondary | ICD-10-CM | POA: Diagnosis not present

## 2016-02-12 DIAGNOSIS — J439 Emphysema, unspecified: Secondary | ICD-10-CM | POA: Diagnosis not present

## 2016-02-12 DIAGNOSIS — R911 Solitary pulmonary nodule: Secondary | ICD-10-CM | POA: Diagnosis not present

## 2016-02-12 DIAGNOSIS — R918 Other nonspecific abnormal finding of lung field: Secondary | ICD-10-CM | POA: Diagnosis not present

## 2016-03-14 DIAGNOSIS — Z96651 Presence of right artificial knee joint: Secondary | ICD-10-CM | POA: Diagnosis not present

## 2016-03-14 DIAGNOSIS — S83282A Other tear of lateral meniscus, current injury, left knee, initial encounter: Secondary | ICD-10-CM | POA: Diagnosis not present

## 2016-03-20 ENCOUNTER — Institutional Professional Consult (permissible substitution): Payer: Medicare Other | Admitting: Pulmonary Disease

## 2016-03-21 DIAGNOSIS — M25562 Pain in left knee: Secondary | ICD-10-CM | POA: Diagnosis not present

## 2016-03-25 DIAGNOSIS — M25562 Pain in left knee: Secondary | ICD-10-CM | POA: Diagnosis not present

## 2016-04-10 ENCOUNTER — Encounter: Payer: Self-pay | Admitting: Physician Assistant

## 2016-04-10 DIAGNOSIS — M1712 Unilateral primary osteoarthritis, left knee: Secondary | ICD-10-CM | POA: Diagnosis present

## 2016-04-10 DIAGNOSIS — M25562 Pain in left knee: Secondary | ICD-10-CM | POA: Diagnosis not present

## 2016-04-10 NOTE — H&P (Signed)
UNI KNEE ADMISSION H&P  Patient is being admitted for left total knee arthroplasty.  Subjective:  Chief Complaint:left knee pain.  HPI: [name], 75 y.o. female, has a history of pain and functional disability in the left knee due to arthritis and has failed non-surgical conservative treatments for greater than 12 weeks to includeNSAID's and/or analgesics, corticosteriod injections, viscosupplementation injections, flexibility and strengthening excercises, supervised PT with diminished ADL's post treatment, use of assistive devices, weight reduction as appropriate and activity modification.  Onset of symptoms was gradual, starting 10 years ago with gradually worsening course since that time. The patient noted no past surgery on the left knee(s).  Patient currently rates pain in the left knee(s) at 10 out of 10 with activity. Patient has night pain, worsening of pain with activity and weight bearing, pain that interferes with activities of daily living, crepitus and joint swelling.  Patient has evidence of subchondral sclerosis, periarticular osteophytes and joint space narrowing by imaging studies.  There is no active infection.  Patient Active Problem List   Diagnosis Date Noted  . Primary localized osteoarthritis of left knee   . 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 CN (constipation) 12/06/2015  . 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 Hemorrhoid 12/06/2015  . Primary localized osteoarthritis of right knee 12/06/2015  . Osteopenia 05/30/2015  . Blood glucose elevated 05/27/2012  . BP (high blood pressure) 12/03/2011  . HLD (hyperlipidemia) 12/03/2011   Past Medical History:  Diagnosis Date  . Blood glucose elevated 05/27/2012  . BP (high blood pressure) 12/03/2011  . Cancer ([place])    skin cancer  . 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  CN (constipation) 12/06/2015  . 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 GERD (gastroesophageal reflux disease)   . Hemorrhoid 12/06/2015  . History of hiatal hernia   . HLD (hyperlipidemia) 12/03/2011  . Osteopenia 05/30/2015   Overview:  Bone density 05/2015 started fosamax   . Primary localized osteoarthritis of left knee   . Primary localized osteoarthritis of right knee 12/06/2015    Past Surgical History:  Procedure Laterality Date  . ABDOMINAL HYSTERECTOMY    . CATARACT EXTRACTION, BILATERAL    . COLONOSCOPY    . EYE SURGERY    . PARTIAL KNEE ARTHROPLASTY Right 12/18/2015   Procedure: UNICOMPARTMENTAL RIGHT KNEE;  Surgeon: Elsie Saas, MD;  Location: [facility];  Service: Orthopedics;  Laterality: Right;  . TOTAL ABDOMINAL HYSTERECTOMY W/ BILATERAL SALPINGOOPHORECTOMY    No current facility-administered medications for this encounter.   Current Outpatient Prescriptions:  .  acetaminophen (TYLENOL) 500 MG tablet, Take 1,000 mg by mouth 4 (four) times daily as needed for moderate pain., Disp: , Rfl:  .  cetirizine (ZYRTEC) 10 MG tablet, Take 10 mg by mouth daily as needed for allergies. , Disp: , Rfl:  .  diclofenac sodium (VOLTAREN) 1 % GEL, Apply 2-4 g topically daily as needed (for arthritis pain). Reported on 12/07/2015, Disp: , Rfl: 1 .  docusate sodium (COLACE) 100 MG capsule, 1 tab 2 times a day while on narcotics.  STOOL SOFTENER, Disp: 60 capsule, Rfl: 0 .  metoprolol succinate (TOPROL-XL) 50 MG 24 hr tablet, Take 50 mg by mouth daily. Take with or immediately following a meal., Disp: ,  Rfl:  .  Multiple Vitamin (MULTIVITAMIN WITH MINERALS) TABS tablet, Take 1 tablet by mouth daily., Disp: , Rfl:  .  polyethylene glycol (MIRALAX / GLYCOLAX) packet, 17grams in 6 oz of water twice a day until bowel movement.  LAXITIVE.  Restart if two days since last bowel movement, Disp: 14 each, Rfl: 0 .   simvastatin (ZOCOR) 20 MG tablet, Take 20 mg by mouth daily., Disp: , Rfl:  .  aspirin EC 325 MG EC tablet, 1 tab a day for the next 30 days to prevent blood clots, Disp: 30 tablet, Rfl: 0 .  calcium carbonate (TUMS - DOSED IN MG ELEMENTAL CALCIUM) 500 MG chewable tablet, Chew 1 tablet by mouth daily., Disp: , Rfl:  .  HYDROmorphone (DILAUDID) 2 MG tablet, 1-2 tablets every 4-6 hrs as needed for pain, Disp: 100 tablet, Rfl: 0 .  meloxicam (MOBIC) 15 MG tablet, Take 15 mg by mouth daily as needed for pain (for inflammation or pain). , Disp: , Rfl: 3 Allergies  Allergen Reactions  . Sulfa Antibiotics Other (See Comments)    UNSPECIFIED, childhood reaction  . Codeine Hives and Rash  . Hydrocodone Other (See Comments)    FATIGUE  . Meperidine Nausea And Vomiting    Social History  Substance Use Topics  . Smoking status: Current Every Day Smoker    Packs/day: 0.50    Years: 50.00    Types: Cigarettes  . Smokeless tobacco: Not on file  . Alcohol use Yes    No family history on file.   Review of Systems  Constitutional: Negative.   Eyes: Negative.   Respiratory: Negative.   Cardiovascular: Negative.   Gastrointestinal: Negative.   Genitourinary: Negative.   Musculoskeletal: Positive for back pain and joint pain.  Skin: Negative.   Neurological: Negative.   Endo/Heme/Allergies: Negative.   Psychiatric/Behavioral: Negative.     Objective:  Physical Exam  Constitutional: She is oriented to person, place, and time. She appears well-developed and well-nourished.  HENT:  Head: Normocephalic and atraumatic.  Mouth/Throat: Oropharynx is clear and moist.  Eyes: Conjunctivae are normal. Pupils are equal, round, and reactive to light.  Neck: Neck supple.  Cardiovascular: Normal rate and regular rhythm.   Respiratory: Effort normal and breath sounds normal.  GI: Bowel sounds are normal.  Genitourinary:  Genitourinary Comments: Not pertinent to current symptomatology therefore not  examined.  Musculoskeletal:  Examination of her left knee reveals pain medially.  1+ synovitis.  Full range of motion.  Knee is stable.  Examination of her right knee reveals well healed incision.  No swelling or pain.  Full range of motion.  Knee is stable.  Vascular exam: Pulses are 2+ and symmetric.  Neurologic exam:   Neurological: She is alert and oriented to person, place, and time.  Skin: Skin is warm and dry.  Psychiatric: She has a normal mood and affect. Her behavior is normal.    Vital signs in last 24 hours: Temp:  [97.6 F (36.4 C)] 97.6 F (36.4 C) (08/02 1300) Pulse Rate:  [72] 72 (08/02 1300) BP: (111)/(70) 111/70 (08/02 1300) SpO2:  [98 %] 98 % (08/02 1300) Weight:  [67.1 kg (148 lb)] 67.1 kg (148 lb) (08/02 1300)  Labs:   Estimated body mass index is 26.22 kg/m as calculated from the following:   Height as of this encounter: '5\' 3"'$  (1.6 m).   Weight as of this encounter: 67.1 kg (148 lb).   Imaging Review Plain radiographs demonstrate severe degenerative joint disease  of the left knee(s). The overall alignment issignificant varus. The bone quality appears to be good for age and reported activity level.  Assessment/Plan:  End stage arthritis, left knee Principal Problem:   Primary localized osteoarthritis of left knee Active Problems:   Cardiac conduction disorder   CN (constipation)   DD (diverticular disease)   Hemorrhoid   BP (high blood pressure)   Blood glucose elevated   HLD (hyperlipidemia)    The patient history, physical examination, clinical judgment of the provider and imaging studies are consistent with end stage degenerative joint disease of the left knee(s) and total knee arthroplasty is deemed medically necessary. The treatment options including medical management, injection therapy arthroscopy and arthroplasty were discussed at length. The risks and benefits of total knee arthroplasty were presented and reviewed. The risks due to aseptic  loosening, infection, stiffness, patella tracking problems, thromboembolic complications and other imponderables were discussed. The patient acknowledged the explanation, agreed to proceed with the plan and consent was signed. Patient is being admitted for inpatient treatment for surgery, pain control, PT, OT, prophylactic antibiotics, VTE prophylaxis, progressive ambulation and ADL's and discharge planning. The patient is planning to be discharged home with home health services

## 2016-04-18 NOTE — Pre-Procedure Instructions (Addendum)
EMER ONNEN  04/18/2016      [name] -[id number]: [phone]: [phone]   Your procedure is scheduled on: Wednesday, August 23.  [facility]               Your surgery or procedure is scheduled for 8:30 AM   Call this number if you have problems the morning of [phone]                For any other questions, please call [phone], Monday - Friday 8 AM - 4 PM.   Remember:  Do not eat food or drink liquids after midnight   Take these medicines the morning of surgery with A SIP OF WATER: metoprolol succinate (TOPROL-XL)                  Take if needed: acetaminophen (TYLENOL) or HYDROmorphone (DILAUDID).                  Stop/Hold Aspirin as instructed by Dr WNoemi Chapel                 1 Week prior to surgery STOP taking  Aspirin Products (Goody Powder, Excedrin Migraine), Ibuprofen (Advil), Naproxen (Aleve)diclofenac sodium (VOLTAREN) 1 % GEL, meloxicam (MOBIC) , Vitiamin and Herbal Products (ie Fish Oil)                [facility]- Preparing For Surgery  Before surgery, you can play an important role. Because skin is not sterile, your skin needs to be as free of germs as possible. You can reduce the number of germs on your skin by washing with CHG (chlorahexidine gluconate) Soap before surgery.  CHG is an antiseptic cleaner which kills germs and bonds with the skin to continue killing germs even after washing.  Please do not use if you have an allergy to CHG or antibacterial soaps. If your skin becomes reddened/irritated stop using the CHG.  Do not shave (including legs and underarms) for at least 48 hours prior to first CHG shower. It is OK to shave your face.  Please follow these instructions carefully.   1. Shower the NIGHT BEFORE SURGERY and the MORNING OF SURGERY with CHG.   2. If you  chose to wash your hair, wash your hair first as usual with your normal shampoo.  3. After you shampoo, rinse your hair and body thoroughly to remove the shampoo.  4. Use CHG as you would any other liquid soap. You can apply CHG directly to the skin and wash gently with a scrungie or a clean washcloth.   5. Apply the CHG Soap to your body ONLY FROM THE NECK DOWN.  Do not use on open wounds or open sores. Avoid contact with your eyes, ears, mouth and genitals (private parts). Wash genitals (private parts) with your normal soap.  6. Wash thoroughly, paying special attention to the area where your surgery will be performed.  7. Thoroughly rinse your body with warm water from the neck down.  8. DO NOT shower/wash with your normal soap after using and rinsing off the CHG Soap.  9. Pat yourself dry with a CLEAN TOWEL.   10. Wear CLEAN PAJAMAS   11. Place CLEAN SHEETS on your bed the night of your first shower and DO NOT SLEEP WITH  PETS.  Day of Surgery: Do not apply any deodorants/lotions. Please wear clean clothes to the hospital/surgery center.     Do not wear jewelry, make-up or nail polish.  Do not wear lotions, powders, or perfumes.  Do not shave 48 hours prior to surgery.    Do not bring valuables to the hospital.  [facility] is not responsible for any belongings or valuables.  Contacts, dentures or bridgework may not be worn into surgery.  Leave your suitcase in the car.  After surgery it may be brought to your room.  For patients admitted to the hospital, discharge time will be determined by your treatment team.  Special instructions: -  Please read over the following fact sheets that you were given.  Patient Instructions for Mupirocin Application, Incentive Spirometry

## 2016-04-19 ENCOUNTER — Encounter (HOSPITAL_COMMUNITY): Payer: Self-pay

## 2016-04-19 ENCOUNTER — Encounter (HOSPITAL_COMMUNITY)
Admission: RE | Admit: 2016-04-19 | Discharge: 2016-04-19 | Disposition: A | Discharge status: Home / Self Care | Payer: Medicare Other | Source: Ambulatory Visit | Attending: Orthopedic Surgery | Admitting: Orthopedic Surgery

## 2016-04-19 DIAGNOSIS — Z01812 Encounter for preprocedural laboratory examination: Secondary | ICD-10-CM | POA: Insufficient documentation

## 2016-04-19 DIAGNOSIS — Z85828 Personal history of other malignant neoplasm of skin: Secondary | ICD-10-CM | POA: Diagnosis not present

## 2016-04-19 DIAGNOSIS — M1712 Unilateral primary osteoarthritis, left knee: Secondary | ICD-10-CM | POA: Diagnosis not present

## 2016-04-19 DIAGNOSIS — I10 Essential (primary) hypertension: Secondary | ICD-10-CM | POA: Diagnosis not present

## 2016-04-19 DIAGNOSIS — K219 Gastro-esophageal reflux disease without esophagitis: Secondary | ICD-10-CM | POA: Diagnosis not present

## 2016-04-19 DIAGNOSIS — Z01818 Encounter for other preprocedural examination: Secondary | ICD-10-CM | POA: Diagnosis not present

## 2016-04-19 DIAGNOSIS — E785 Hyperlipidemia, unspecified: Secondary | ICD-10-CM | POA: Diagnosis not present

## 2016-04-19 DIAGNOSIS — Z0183 Encounter for blood typing: Secondary | ICD-10-CM | POA: Insufficient documentation

## 2016-04-19 DIAGNOSIS — Z79899 Other long term (current) drug therapy: Secondary | ICD-10-CM | POA: Insufficient documentation

## 2016-04-19 HISTORY — DX: Essential (primary) hypertension: I10

## 2016-04-19 LAB — CBC WITH DIFFERENTIAL/PLATELET
Basophils Absolute: 0 10*3/uL (ref 0.0–0.1)
Basophils Relative: 0 %
EOS ABS: 0.1 10*3/uL (ref 0.0–0.7)
EOS PCT: 1 %
HEMATOCRIT: 43.5 % (ref 36.0–46.0)
HEMOGLOBIN: 14.3 g/dL (ref 12.0–15.0)
LYMPHS ABS: 2.4 10*3/uL (ref 0.7–4.0)
Lymphocytes Relative: 24 %
MCH: 32 pg (ref 26.0–34.0)
MCHC: 32.9 g/dL (ref 30.0–36.0)
MCV: 97.3 fL (ref 78.0–100.0)
MONO ABS: 0.4 10*3/uL (ref 0.1–1.0)
MONOS PCT: 4 %
Neutro Abs: 7.2 10*3/uL (ref 1.7–7.7)
Neutrophils Relative %: 71 %
PLATELETS: 227 10*3/uL (ref 150–400)
RBC: 4.47 MIL/uL (ref 3.87–5.11)
RDW: 12.6 % (ref 11.5–15.5)
WBC: 10.1 10*3/uL (ref 4.0–10.5)

## 2016-04-19 LAB — PROTIME-INR
INR: 0.98
PROTHROMBIN TIME: 13 s (ref 11.4–15.2)

## 2016-04-19 LAB — COMPREHENSIVE METABOLIC PANEL
ALBUMIN: 4 g/dL (ref 3.5–5.0)
ALT: 11 U/L — AB (ref 14–54)
AST: 21 U/L (ref 15–41)
Alkaline Phosphatase: 63 U/L (ref 38–126)
Anion gap: 9 (ref 5–15)
BILIRUBIN TOTAL: 0.6 mg/dL (ref 0.3–1.2)
BUN: 13 mg/dL (ref 6–20)
CHLORIDE: 100 mmol/L — AB (ref 101–111)
CO2: 25 mmol/L (ref 22–32)
CREATININE: 0.77 mg/dL (ref 0.44–1.00)
Calcium: 9.5 mg/dL (ref 8.9–10.3)
GFR calc Af Amer: >60 mL/min (ref >60)
GLUCOSE: 103 mg/dL — AB (ref 65–99)
POTASSIUM: 3.8 mmol/L (ref 3.5–5.1)
Sodium: 134 mmol/L — ABNORMAL LOW (ref 135–145)
Total Protein: 6.2 g/dL — ABNORMAL LOW (ref 6.5–8.1)

## 2016-04-19 LAB — SURGICAL PCR SCREEN
MRSA, PCR: NEGATIVE
Staphylococcus aureus: NEGATIVE

## 2016-04-19 LAB — TYPE AND SCREEN
ABO/RH(D): A POS
ANTIBODY SCREEN: NEGATIVE

## 2016-04-19 LAB — APTT: aPTT: 29 seconds (ref 24–36)

## 2016-04-19 NOTE — Progress Notes (Signed)
PCp is Dr. Anastasia Pall Cardiologist is Dr Sigmund Hazel Tracing place on chart See Allison's note from April 2017 Instructed pt not to smoke on the day of surgery.

## 2016-04-20 LAB — URINE CULTURE

## 2016-04-23 DIAGNOSIS — Z85828 Personal history of other malignant neoplasm of skin: Secondary | ICD-10-CM | POA: Diagnosis not present

## 2016-04-23 DIAGNOSIS — L720 Epidermal cyst: Secondary | ICD-10-CM | POA: Diagnosis not present

## 2016-04-23 DIAGNOSIS — D2239 Melanocytic nevi of other parts of face: Secondary | ICD-10-CM | POA: Diagnosis not present

## 2016-04-23 DIAGNOSIS — L814 Other melanin hyperpigmentation: Secondary | ICD-10-CM | POA: Diagnosis not present

## 2016-04-23 DIAGNOSIS — L738 Other specified follicular disorders: Secondary | ICD-10-CM | POA: Diagnosis not present

## 2016-04-23 DIAGNOSIS — L821 Other seborrheic keratosis: Secondary | ICD-10-CM | POA: Diagnosis not present

## 2016-04-23 DIAGNOSIS — D225 Melanocytic nevi of trunk: Secondary | ICD-10-CM | POA: Diagnosis not present

## 2016-04-23 NOTE — Progress Notes (Signed)
Anesthesia Chart Review: Patient is a 75 year old female scheduled for left unicompartmental knee replacement on 05/01/16 by Dr. Noemi Chapel.   History includes smoking, HTN, HLD, hiatal hernia, GERD, skin cancer, elevated glucose (without mention of DM; A1c 5.8 02/20/15), hysterectomy, s/p right unicompartmental knee 12/18/15. BMI 27.   PCP is Dr. Anastasia Pall ([facility]). He saw her for pre-operative evaluation on 11/08/15. Due to "inverted T waves" in anterior leads he referred her for cardiac stress testing. She was referred to [facility] and saw Neldon Labella, NP with Dr. Adrian Prows prior to her April 2017 surgery. Perfusion imaging then was normal, so patient was cleared with "acceptable cardiovascular risk" PRN cardiology follow-up recommended.  Medications include Os-Cal, Toprol XL, Zocor.   The following records are scanned under the Media tab, Correspondence, 12/18/15 Encounter: - 11/21/15 EKG [facility] CV): NSR, non-specific ST/T wave abnormality.  - 11/20/15 Nuclear stress test [facility] CV): Impression: 1. The resting ECG demonstrated NSR, normal resting conduction, no resting arrythmias and non-specific ST-T changes. Stress ECG is non-diagnostic for ischemia as it a pharmacologic stress using Lexiscan. Stress symptoms included dyspnea.  2. Myocardial perfusion imaging is normal. Overall LV systolic function was normal without regional wall motion abnormalities. LVEF was 63%.   Reportedly, she had a cardiac cath in 2011 and "was told it was completely normal." I do not have that report.  Preoperative labs noted. Urine culture showed multiple species present, suggest recollection.   If no acute changes then I anticipate that she can proceed as planned from an anesthesia standpoint.  George Hugh [facility] Short Stay Center/Anesthesiology Phone [phone] 04/23/2016 12:35 PM

## 2016-05-01 ENCOUNTER — Inpatient Hospital Stay (HOSPITAL_COMMUNITY): Payer: Medicare Other | Admitting: Emergency Medicine

## 2016-05-01 ENCOUNTER — Inpatient Hospital Stay (HOSPITAL_COMMUNITY): Payer: Medicare Other | Admitting: Certified Registered"

## 2016-05-01 ENCOUNTER — Ambulatory Visit (HOSPITAL_COMMUNITY)
Admission: RE | Admit: 2016-05-01 | Discharge: 2016-05-02 | Disposition: A | Discharge status: Home / Self Care | Payer: Medicare Other | Source: Ambulatory Visit | Attending: Orthopedic Surgery | Admitting: Orthopedic Surgery

## 2016-05-01 ENCOUNTER — Encounter (HOSPITAL_COMMUNITY): Payer: Self-pay | Admitting: Surgery

## 2016-05-01 ENCOUNTER — Encounter (HOSPITAL_COMMUNITY)
Admission: RE | Disposition: A | Discharge status: Home / Self Care | Payer: Self-pay | Source: Ambulatory Visit | Attending: Orthopedic Surgery

## 2016-05-01 DIAGNOSIS — M1712 Unilateral primary osteoarthritis, left knee: Secondary | ICD-10-CM | POA: Diagnosis not present

## 2016-05-01 DIAGNOSIS — R739 Hyperglycemia, unspecified: Secondary | ICD-10-CM | POA: Diagnosis not present

## 2016-05-01 DIAGNOSIS — Z7982 Long term (current) use of aspirin: Secondary | ICD-10-CM | POA: Diagnosis not present

## 2016-05-01 DIAGNOSIS — I459 Conduction disorder, unspecified: Secondary | ICD-10-CM | POA: Diagnosis present

## 2016-05-01 DIAGNOSIS — F17210 Nicotine dependence, cigarettes, uncomplicated: Secondary | ICD-10-CM | POA: Diagnosis not present

## 2016-05-01 DIAGNOSIS — Z79899 Other long term (current) drug therapy: Secondary | ICD-10-CM | POA: Diagnosis not present

## 2016-05-01 DIAGNOSIS — K5790 Diverticulosis of intestine, part unspecified, without perforation or abscess without bleeding: Secondary | ICD-10-CM | POA: Diagnosis present

## 2016-05-01 DIAGNOSIS — K5900 Constipation, unspecified: Secondary | ICD-10-CM | POA: Diagnosis present

## 2016-05-01 DIAGNOSIS — E785 Hyperlipidemia, unspecified: Secondary | ICD-10-CM | POA: Diagnosis not present

## 2016-05-01 DIAGNOSIS — I10 Essential (primary) hypertension: Secondary | ICD-10-CM | POA: Diagnosis not present

## 2016-05-01 DIAGNOSIS — G8918 Other acute postprocedural pain: Secondary | ICD-10-CM | POA: Diagnosis not present

## 2016-05-01 DIAGNOSIS — M179 Osteoarthritis of knee, unspecified: Secondary | ICD-10-CM | POA: Diagnosis not present

## 2016-05-01 DIAGNOSIS — M8580 Other specified disorders of bone density and structure, unspecified site: Secondary | ICD-10-CM | POA: Diagnosis not present

## 2016-05-01 DIAGNOSIS — Z96651 Presence of right artificial knee joint: Secondary | ICD-10-CM | POA: Insufficient documentation

## 2016-05-01 DIAGNOSIS — K649 Unspecified hemorrhoids: Secondary | ICD-10-CM | POA: Diagnosis present

## 2016-05-01 HISTORY — PX: PARTIAL KNEE ARTHROPLASTY: SHX2174

## 2016-05-01 HISTORY — DX: Unilateral primary osteoarthritis, left knee: M17.12

## 2016-05-01 HISTORY — PX: KNEE SURGERY: SHX244

## 2016-05-01 SURGERY — ARTHROPLASTY, KNEE, UNICOMPARTMENTAL
Anesthesia: Regional | Site: Knee | Laterality: Left

## 2016-05-01 MED ORDER — MENTHOL 3 MG MT LOZG: 1.0000 | LOZENGE | OROMUCOSAL | Status: DC | PRN

## 2016-05-01 MED ORDER — PHENOL 1.4 % MT LIQD: 1.0000 | OROMUCOSAL | Status: DC | PRN

## 2016-05-01 MED ORDER — ONDANSETRON HCL 4 MG/2ML IJ SOLN: 4.0000 mg | Freq: Once | INTRAMUSCULAR | Status: DC | PRN

## 2016-05-01 MED ORDER — ROPIVACAINE HCL 5 MG/ML IJ SOLN
INTRAMUSCULAR | Status: DC | PRN
Start: 2016-05-01 — End: 2016-05-01
  Administered 2016-05-01: 20 mL via PERINEURAL

## 2016-05-01 MED ORDER — CALCIUM CARBONATE 1250 (500 CA) MG PO TABS: 600.0000 mg | ORAL_TABLET | Freq: Every day | ORAL | Status: DC

## 2016-05-01 MED ORDER — ONDANSETRON HCL 4 MG/2ML IJ SOLN
INTRAMUSCULAR | Status: AC
  Filled 2016-05-01: qty 2

## 2016-05-01 MED ORDER — PROPOFOL 10 MG/ML IV BOLUS
INTRAVENOUS | Status: AC
  Filled 2016-05-01: qty 20

## 2016-05-01 MED ORDER — CHLORHEXIDINE GLUCONATE 4 % EX LIQD: 60.0000 mL | Freq: Once | CUTANEOUS | Status: DC

## 2016-05-01 MED ORDER — METOCLOPRAMIDE HCL 5 MG/ML IJ SOLN: 5.0000 mg | Freq: Three times a day (TID) | INTRAMUSCULAR | Status: DC | PRN

## 2016-05-01 MED ORDER — ONDANSETRON HCL 4 MG/2ML IJ SOLN
INTRAMUSCULAR | Status: DC | PRN
  Administered 2016-05-01: 4 mg via INTRAVENOUS

## 2016-05-01 MED ORDER — DOCUSATE SODIUM 100 MG PO CAPS
100.0000 mg | ORAL_CAPSULE | Freq: Two times a day (BID) | ORAL | Status: DC
  Administered 2016-05-01 (×2): 100 mg via ORAL
  Filled 2016-05-01 (×2): qty 1

## 2016-05-01 MED ORDER — FENTANYL CITRATE (PF) 100 MCG/2ML IJ SOLN
INTRAMUSCULAR | Status: AC
  Filled 2016-05-01: qty 2

## 2016-05-01 MED ORDER — ONDANSETRON HCL 4 MG PO TABS: 4.0000 mg | ORAL_TABLET | Freq: Four times a day (QID) | ORAL | Status: DC | PRN

## 2016-05-01 MED ORDER — METOPROLOL SUCCINATE ER 50 MG PO TB24: 50.0000 mg | ORAL_TABLET | Freq: Every day | ORAL | Status: DC

## 2016-05-01 MED ORDER — CEFAZOLIN SODIUM-DEXTROSE 2-4 GM/100ML-% IV SOLN
2.0000 g | Freq: Four times a day (QID) | INTRAVENOUS | Status: AC
  Administered 2016-05-01 (×2): 2 g via INTRAVENOUS
  Filled 2016-05-01 (×2): qty 100

## 2016-05-01 MED ORDER — LIDOCAINE 2% (20 MG/ML) 5 ML SYRINGE
INTRAMUSCULAR | Status: AC
  Filled 2016-05-01: qty 5

## 2016-05-01 MED ORDER — POTASSIUM CHLORIDE IN NACL 20-0.9 MEQ/L-% IV SOLN
INTRAVENOUS | Status: DC
  Administered 2016-05-01: 15:00:00 via INTRAVENOUS
  Filled 2016-05-01: qty 1000

## 2016-05-01 MED ORDER — CALCIUM CARBONATE 1250 (500 CA) MG PO TABS
1.0000 | ORAL_TABLET | Freq: Every day | ORAL | Status: DC
  Administered 2016-05-02: 500 mg via ORAL
  Filled 2016-05-01: qty 1

## 2016-05-01 MED ORDER — MIDAZOLAM HCL 2 MG/2ML IJ SOLN
INTRAMUSCULAR | Status: AC
  Filled 2016-05-01: qty 2

## 2016-05-01 MED ORDER — DIPHENHYDRAMINE HCL 12.5 MG/5ML PO ELIX: 12.5000 mg | ORAL_SOLUTION | ORAL | Status: DC | PRN

## 2016-05-01 MED ORDER — ALUM & MAG HYDROXIDE-SIMETH 200-200-20 MG/5ML PO SUSP
30.0000 mL | ORAL | Status: DC | PRN
  Administered 2016-05-01: 30 mL via ORAL
  Filled 2016-05-01: qty 30

## 2016-05-01 MED ORDER — ACETAMINOPHEN 325 MG PO TABS: 650.0000 mg | ORAL_TABLET | Freq: Four times a day (QID) | ORAL | Status: DC | PRN

## 2016-05-01 MED ORDER — PROPOFOL 500 MG/50ML IV EMUL
INTRAVENOUS | Status: DC | PRN
  Administered 2016-05-01: 100 ug/kg/min via INTRAVENOUS

## 2016-05-01 MED ORDER — ONDANSETRON HCL 4 MG/2ML IJ SOLN: 4.0000 mg | Freq: Four times a day (QID) | INTRAMUSCULAR | Status: DC | PRN

## 2016-05-01 MED ORDER — DEXAMETHASONE SODIUM PHOSPHATE 10 MG/ML IJ SOLN
INTRAMUSCULAR | Status: DC | PRN
  Administered 2016-05-01: 10 mg via INTRAVENOUS

## 2016-05-01 MED ORDER — FENTANYL CITRATE (PF) 250 MCG/5ML IJ SOLN
INTRAMUSCULAR | Status: DC | PRN
  Administered 2016-05-01 (×2): 50 ug via INTRAVENOUS

## 2016-05-01 MED ORDER — EPHEDRINE SULFATE 50 MG/ML IJ SOLN
INTRAMUSCULAR | Status: DC | PRN
  Administered 2016-05-01 (×2): 10 mg via INTRAVENOUS

## 2016-05-01 MED ORDER — ACETAMINOPHEN 650 MG RE SUPP
650.0000 mg | Freq: Four times a day (QID) | RECTAL | Status: DC | PRN
Start: 2016-05-01 — End: 2016-05-02

## 2016-05-01 MED ORDER — EPHEDRINE 5 MG/ML INJ
INTRAVENOUS | Status: AC
  Filled 2016-05-01: qty 10

## 2016-05-01 MED ORDER — ASPIRIN EC 325 MG PO TBEC
325.0000 mg | DELAYED_RELEASE_TABLET | Freq: Every day | ORAL | Status: DC
  Administered 2016-05-02: 325 mg via ORAL
  Filled 2016-05-01: qty 1

## 2016-05-01 MED ORDER — LACTATED RINGERS IV SOLN
INTRAVENOUS | Status: DC
  Administered 2016-05-01 (×2): via INTRAVENOUS

## 2016-05-01 MED ORDER — METOCLOPRAMIDE HCL 5 MG PO TABS: 5.0000 mg | ORAL_TABLET | Freq: Three times a day (TID) | ORAL | Status: DC | PRN

## 2016-05-01 MED ORDER — FENTANYL CITRATE (PF) 100 MCG/2ML IJ SOLN: 25.0000 ug | INTRAMUSCULAR | Status: DC | PRN

## 2016-05-01 MED ORDER — POVIDONE-IODINE 7.5 % EX SOLN: Freq: Once | CUTANEOUS | Status: DC

## 2016-05-01 MED ORDER — LIDOCAINE HCL (CARDIAC) 20 MG/ML IV SOLN
INTRAVENOUS | Status: DC | PRN
  Administered 2016-05-01: 20 mg via INTRATRACHEAL

## 2016-05-01 MED ORDER — 0.9 % SODIUM CHLORIDE (POUR BTL) OPTIME
TOPICAL | Status: DC | PRN
  Administered 2016-05-01: 1000 mL

## 2016-05-01 MED ORDER — PHENYLEPHRINE HCL 10 MG/ML IJ SOLN
INTRAVENOUS | Status: DC | PRN
  Administered 2016-05-01: 30 ug/min via INTRAVENOUS

## 2016-05-01 MED ORDER — GLYCOPYRROLATE 0.2 MG/ML IJ SOLN
INTRAMUSCULAR | Status: DC | PRN
  Administered 2016-05-01: 0.2 mg via INTRAVENOUS

## 2016-05-01 MED ORDER — PROPOFOL 1000 MG/100ML IV EMUL
INTRAVENOUS | Status: AC
  Filled 2016-05-01: qty 100

## 2016-05-01 MED ORDER — CEFAZOLIN SODIUM-DEXTROSE 2-4 GM/100ML-% IV SOLN
INTRAVENOUS | Status: AC
  Filled 2016-05-01: qty 100

## 2016-05-01 MED ORDER — DEXAMETHASONE SODIUM PHOSPHATE 10 MG/ML IJ SOLN
10.0000 mg | Freq: Three times a day (TID) | INTRAMUSCULAR | Status: DC
  Administered 2016-05-01 – 2016-05-02 (×2): 10 mg via INTRAVENOUS
  Filled 2016-05-01 (×2): qty 1

## 2016-05-01 MED ORDER — CEFAZOLIN SODIUM-DEXTROSE 2-4 GM/100ML-% IV SOLN
2.0000 g | INTRAVENOUS | Status: AC
  Administered 2016-05-01: 2 g via INTRAVENOUS

## 2016-05-01 MED ORDER — BUPIVACAINE IN DEXTROSE 0.75-8.25 % IT SOLN
INTRATHECAL | Status: DC | PRN
  Administered 2016-05-01: 2 mL via INTRATHECAL

## 2016-05-01 MED ORDER — HYDROMORPHONE HCL 2 MG PO TABS
2.0000 mg | ORAL_TABLET | ORAL | Status: DC | PRN
  Administered 2016-05-01 – 2016-05-02 (×4): 4 mg via ORAL
  Filled 2016-05-01 (×3): qty 2
  Filled 2016-05-01 (×2): qty 1

## 2016-05-01 MED ORDER — MORPHINE SULFATE (PF) 2 MG/ML IV SOLN: 2.0000 mg | INTRAVENOUS | Status: DC | PRN

## 2016-05-01 MED ORDER — POLYETHYLENE GLYCOL 3350 17 G PO PACK
17.0000 g | PACK | Freq: Two times a day (BID) | ORAL | Status: DC
  Administered 2016-05-01: 17 g via ORAL
  Filled 2016-05-01: qty 1

## 2016-05-01 MED ORDER — SIMVASTATIN 20 MG PO TABS
20.0000 mg | ORAL_TABLET | Freq: Every day | ORAL | Status: DC
  Administered 2016-05-01: 20 mg via ORAL
  Filled 2016-05-01: qty 1

## 2016-05-01 MED ORDER — GLYCOPYRROLATE 0.2 MG/ML IV SOSY
PREFILLED_SYRINGE | INTRAVENOUS | Status: AC
  Filled 2016-05-01: qty 3

## 2016-05-01 SURGICAL SUPPLY — 72 items
BANDAGE ELASTIC 4 VELCRO ST LF (GAUZE/BANDAGES/DRESSINGS) ×3 IMPLANT
BANDAGE ELASTIC 6 VELCRO ST LF (GAUZE/BANDAGES/DRESSINGS) ×3 IMPLANT
BANDAGE ESMARK 6X9 LF (GAUZE/BANDAGES/DRESSINGS) ×1 IMPLANT
BLADE SURG 11 STRL SS (BLADE) IMPLANT
BNDG ESMARK 6X9 LF (GAUZE/BANDAGES/DRESSINGS) ×3
BONE CEMENT PALACOSE (Orthopedic Implant) ×3 IMPLANT
BOWL SMART MIX CTS (DISPOSABLE) ×3 IMPLANT
CAPT KNEE PARTIAL 2 ×3 IMPLANT
CEMENT BONE PALACOSE (Orthopedic Implant) ×1 IMPLANT
CLOSURE STERI-STRIP 1/2X4 (GAUZE/BANDAGES/DRESSINGS) ×1
CLOSURE WOUND 1/2 X4 (GAUZE/BANDAGES/DRESSINGS) ×1
CLSR STERI-STRIP ANTIMIC 1/2X4 (GAUZE/BANDAGES/DRESSINGS) ×2 IMPLANT
COVER SURGICAL LIGHT HANDLE (MISCELLANEOUS) ×3 IMPLANT
CUFF TOURNIQUET SINGLE 34IN LL (TOURNIQUET CUFF) IMPLANT
DRAPE EXTREMITY T 121X128X90 (DRAPE) ×3 IMPLANT
DRAPE INCISE IOBAN 66X45 STRL (DRAPES) ×3 IMPLANT
DRAPE PROXIMA HALF (DRAPES) ×3 IMPLANT
DRAPE U-SHAPE 47X51 STRL (DRAPES) ×3 IMPLANT
DRESSING AQUACEL AG SP 3.5X10 (GAUZE/BANDAGES/DRESSINGS) ×1 IMPLANT
DRSG ADAPTIC 3X8 NADH LF (GAUZE/BANDAGES/DRESSINGS) ×3 IMPLANT
DRSG AQUACEL AG SP 3.5X10 (GAUZE/BANDAGES/DRESSINGS) ×3
DRSG PAD ABDOMINAL 8X10 ST (GAUZE/BANDAGES/DRESSINGS) ×6 IMPLANT
DURAPREP 26ML APPLICATOR (WOUND CARE) ×3 IMPLANT
ELECT CAUTERY BLADE 6.4 (BLADE) ×3 IMPLANT
ELECT REM PT RETURN 9FT ADLT (ELECTROSURGICAL) ×3
ELECTRODE REM PT RTRN 9FT ADLT (ELECTROSURGICAL) ×1 IMPLANT
EVACUATOR 1/8 PVC DRAIN (DRAIN) IMPLANT
FACESHIELD WRAPAROUND (MASK) ×3 IMPLANT
GAUZE SPONGE 4X4 12PLY STRL (GAUZE/BANDAGES/DRESSINGS) ×3 IMPLANT
GLOVE BIO SURGEON STRL SZ7 (GLOVE) ×3 IMPLANT
GLOVE BIOGEL PI IND STRL 7.0 (GLOVE) ×1 IMPLANT
GLOVE BIOGEL PI IND STRL 7.5 (GLOVE) ×1 IMPLANT
GLOVE BIOGEL PI INDICATOR 7.0 (GLOVE) ×2
GLOVE BIOGEL PI INDICATOR 7.5 (GLOVE) ×2
GLOVE SS BIOGEL STRL SZ 7.5 (GLOVE) ×1 IMPLANT
GLOVE SUPERSENSE BIOGEL SZ 7.5 (GLOVE) ×2
GOWN STRL REUS W/ TWL LRG LVL3 (GOWN DISPOSABLE) ×2 IMPLANT
GOWN STRL REUS W/ TWL XL LVL3 (GOWN DISPOSABLE) ×3 IMPLANT
GOWN STRL REUS W/TWL LRG LVL3 (GOWN DISPOSABLE) ×4
GOWN STRL REUS W/TWL XL LVL3 (GOWN DISPOSABLE) ×6
HANDPIECE INTERPULSE COAX TIP (DISPOSABLE) ×2
HOOD PEEL AWAY FACE SHEILD DIS (HOOD) ×6 IMPLANT
IMMOBILIZER KNEE 22 UNIV (SOFTGOODS) IMPLANT
KIT BASIN OR (CUSTOM PROCEDURE TRAY) ×3 IMPLANT
KIT ROOM TURNOVER OR (KITS) ×3 IMPLANT
MANIFOLD NEPTUNE II (INSTRUMENTS) ×3 IMPLANT
NEEDLE SPNL 18GX3.5 QUINCKE PK (NEEDLE) IMPLANT
NS IRRIG 1000ML POUR BTL (IV SOLUTION) ×3 IMPLANT
PACK BLADE SAW RECIP 70 3 PT (BLADE) ×3 IMPLANT
PACK TOTAL JOINT (CUSTOM PROCEDURE TRAY) ×3 IMPLANT
PACK UNIVERSAL I (CUSTOM PROCEDURE TRAY) ×3 IMPLANT
PAD ARMBOARD 7.5X6 YLW CONV (MISCELLANEOUS) ×6 IMPLANT
PAD CAST 4YDX4 CTTN HI CHSV (CAST SUPPLIES) ×1 IMPLANT
PADDING CAST COTTON 4X4 STRL (CAST SUPPLIES) ×2
PADDING CAST COTTON 6X4 STRL (CAST SUPPLIES) ×3 IMPLANT
RUBBERBAND STERILE (MISCELLANEOUS) ×3 IMPLANT
SET HNDPC FAN SPRY TIP SCT (DISPOSABLE) ×1 IMPLANT
STRIP CLOSURE SKIN 1/2X4 (GAUZE/BANDAGES/DRESSINGS) ×2 IMPLANT
SUCTION FRAZIER HANDLE 10FR (MISCELLANEOUS) ×2
SUCTION TUBE FRAZIER 10FR DISP (MISCELLANEOUS) ×1 IMPLANT
SUT ETHIBOND NAB CT1 #1 30IN (SUTURE) IMPLANT
SUT MNCRL AB 3-0 PS2 18 (SUTURE) ×3 IMPLANT
SUT VIC AB 0 CT1 27 (SUTURE) ×4
SUT VIC AB 0 CT1 27XBRD ANBCTR (SUTURE) ×2 IMPLANT
SUT VIC AB 1 CT1 27 (SUTURE) ×2
SUT VIC AB 1 CT1 27XBRD ANBCTR (SUTURE) ×1 IMPLANT
SUT VIC AB 2-0 CT1 27 (SUTURE) ×4
SUT VIC AB 2-0 CT1 TAPERPNT 27 (SUTURE) ×2 IMPLANT
TOWEL OR 17X24 6PK STRL BLUE (TOWEL DISPOSABLE) ×3 IMPLANT
TOWEL OR 17X26 10 PK STRL BLUE (TOWEL DISPOSABLE) ×3 IMPLANT
TRAY FOLEY CATH 16FRSI W/METER (SET/KITS/TRAYS/PACK) IMPLANT
WATER STERILE IRR 1000ML POUR (IV SOLUTION) ×3 IMPLANT

## 2016-05-01 NOTE — Anesthesia Preprocedure Evaluation (Addendum)
Anesthesia Evaluation  Patient identified by MRN, date of birth, ID band Patient awake    Reviewed: Allergy & Precautions, NPO status , Patient's Chart, lab work & pertinent test results, reviewed documented beta blocker date and time   History of Anesthesia Complications Negative for: history of anesthetic complications  Airway Mallampati: II  TM Distance: >3 FB Neck ROM: Full    Dental  (+) Edentulous Upper, Partial Lower, Dental Advisory Given   Pulmonary COPD, Current Smoker,    breath sounds clear to auscultation       Cardiovascular hypertension, Pt. on home beta blockers (-) angina+ dysrhythmias  Rhythm:Regular Rate:Normal  - 11/21/15 EKG [facility] CV): NSR, non-specific ST/T wave abnormality.  - 11/20/15 Nuclear stress test [facility] CV): Impression: 1. The resting ECG demonstrated NSR, normal resting conduction, no resting arrythmias and non-specific ST-T changes. Stress ECG is non-diagnostic for ischemia as it a pharmacologic stress using Lexiscan. Stress symptoms included dyspnea.  2. Myocardial perfusion imaging is normal. Overall LV systolic function was normal without regional wall motion abnormalities. LVEF was 63%.    Neuro/Psych negative neurological ROS  negative psych ROS   GI/Hepatic Neg liver ROS, GERD  Controlled,  Endo/Other  negative endocrine ROS  Renal/GU negative Renal ROS     Musculoskeletal  (+) Arthritis , Osteoarthritis,    Abdominal   Peds  Hematology negative hematology ROS (+) Plt 227k   Anesthesia Other Findings Day of surgery medications reviewed with the patient.  Reproductive/Obstetrics                           Anesthesia Physical  Anesthesia Plan  ASA: II  Anesthesia Plan: Regional and Spinal   Post-op Pain Management:  Regional for Post-op pain   Induction:   Airway Management Planned: Natural Airway and Nasal Cannula  Additional Equipment:    Intra-op Plan:   Post-operative Plan:   Informed Consent: I have reviewed the patients History and Physical, chart, labs and discussed the procedure including the risks, benefits and alternatives for the proposed anesthesia with the patient or authorized representative who has indicated his/her understanding and acceptance.   Dental advisory given  Plan Discussed with: CRNA and Surgeon  Anesthesia Plan Comments: (Plan routine monitors, SAB with adductor canal block for post op analgesia)        Anesthesia Quick Evaluation

## 2016-05-01 NOTE — Transfer of Care (Signed)
Immediate Anesthesia Transfer of Care Note  Patient: [name]  Procedure(s) Performed: Procedure(s): UNICOMPARTMENTAL KNEE (Left)  Patient Location: PACU  Anesthesia Type:Spinal  Level of Consciousness: awake, alert  and oriented  Airway & Oxygen Therapy: Patient Spontanous Breathing  Post-op Assessment: Report given to RN and Post -op Vital signs reviewed and stable  Post vital signs: Reviewed and stable  Last Vitals:  Vitals:   04/10/16 1300 05/01/16 0639  BP: 111/70 138/76  Pulse: 72 73  Resp:  20  Temp: 36.4 C 36.5 C    Last Pain:  Vitals:   05/01/16 0639  TempSrc: Oral         Complications: No apparent anesthesia complications

## 2016-05-01 NOTE — Anesthesia Postprocedure Evaluation (Signed)
Anesthesia Post Note  Patient: KATILYN MILTENBERGER  Procedure(s) Performed: Procedure(s) (LRB): UNICOMPARTMENTAL KNEE (Left)  Patient location during evaluation: PACU Anesthesia Type: Regional and Spinal Level of consciousness: awake and alert Pain management: pain level controlled Vital Signs Assessment: post-procedure vital signs reviewed and stable Respiratory status: spontaneous breathing, nonlabored ventilation, respiratory function stable and patient connected to nasal cannula oxygen Cardiovascular status: blood pressure returned to baseline and stable Postop Assessment: no signs of nausea or vomiting, spinal receding, no backache and no headache Anesthetic complications: no    Last Vitals:  Vitals:   05/01/16 1300 05/01/16 1319  BP: 109/72 (!) 108/52  Pulse: 60 62  Resp: 16 16  Temp:  36.4 C    Last Pain:  Vitals:   05/01/16 1446  TempSrc:   PainSc: [place]

## 2016-05-01 NOTE — Progress Notes (Signed)
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

## 2016-05-01 NOTE — Anesthesia Procedure Notes (Signed)
Procedure Name: MAC Date/Time: 05/01/2016 9:23 AM Performed by: Huey Romans ANN Pre-anesthesia Checklist: Patient identified, Emergency Drugs available, Suction available and Patient being monitored Patient Re-evaluated:Patient Re-evaluated prior to inductionOxygen Delivery Method: Simple face mask

## 2016-05-01 NOTE — Interval H&P Note (Signed)
History and Physical Interval Note:  05/01/2016 9:05 AM  [name]  has presented today for surgery, with the diagnosis of Primary localizedOA left knee  The various methods of treatment have been discussed with the patient and family. After consideration of risks, benefits and other options for treatment, the patient has consented to  Procedure(s): UNICOMPARTMENTAL KNEE (Left) as a surgical intervention .  The patient's history has been reviewed, patient examined, no change in status, stable for surgery.  I have reviewed the patient's chart and labs.  Questions were answered to the patient's satisfaction.     Elsie Saas A

## 2016-05-01 NOTE — Anesthesia Procedure Notes (Signed)
Spinal  Patient location during procedure: OR Start time: 05/01/2016 9:19 AM End time: 05/01/2016 9:20 AM Staffing Anesthesiologist: Catalina Gravel Performed: anesthesiologist  Preanesthetic Checklist Completed: patient identified, surgical consent, pre-op evaluation, timeout performed, IV checked, risks and benefits discussed and monitors and equipment checked Spinal Block Patient position: sitting Prep: site prepped and draped and DuraPrep Patient monitoring: continuous pulse ox and blood pressure Approach: midline Location: L3-4 Needle Needle type: Pencan  Needle gauge: 24 G Needle length: 9 cm Additional Notes Functioning IV was confirmed and monitors were applied. Sterile prep and drape, including hand hygiene, mask and sterile gloves were used. The patient was positioned and the spine was prepped. The skin was anesthetized with lidocaine.  Free flow of clear CSF was obtained prior to injecting local anesthetic into the CSF.  The spinal needle aspirated freely following injection.  The needle was carefully withdrawn.  The patient tolerated the procedure well. Consent was obtained prior to procedure with all questions answered and concerns addressed. Risks including but not limited to bleeding, infection, nerve damage, paralysis, failed block, inadequate analgesia, allergic reaction, high spinal, itching and headache were discussed and the patient wished to proceed.   Hoy Morn, MD

## 2016-05-01 NOTE — Anesthesia Procedure Notes (Signed)
Anesthesia Regional Block:  Adductor canal block  Pre-Anesthetic Checklist: ,, timeout performed, Correct Patient, Correct Site, Correct Laterality, Correct Procedure, Correct Position, site marked, Risks and benefits discussed,  Surgical consent,  Pre-op evaluation,  At surgeon's request and post-op pain management  Laterality: Left  Prep: chloraprep       Needles:  Injection technique: Single-shot  Needle Type: Echogenic Needle     Needle Length: 9cm 9 cm Needle Gauge: 21 and 21 G    Additional Needles:  Procedures: ultrasound guided (picture in chart) Adductor canal block Narrative:  Injection made incrementally with aspirations every 5 mL.  Performed by: Personally  Anesthesiologist: Catalina Gravel  Additional Notes: No pain on injection. No increased resistance to injection. Injection made in 5cc increments.  Good needle visualization.  Patient tolerated procedure well.

## 2016-05-01 NOTE — Evaluation (Signed)
Physical Therapy Evaluation Patient Details Name: [name] MRN: [id number] DOB: Jan 23, 1941 Today's Date: 05/01/2016   History of Present Illness  75 y.o. female admitted to [facility] on 05/01/16 for elective L unicompartmental knee arthroplasty.  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  Clinical Impression  Pt is POD #0 and is moving well despite still numb left leg.  She will likely progress well enough to d/c home with spouse's assist at discharge. PT to follow acutely until d/c confirmed.       Follow Up Recommendations Home health PT;Supervision for mobility/OOB    Equipment Recommendations  None recommended by PT    Recommendations for Other Services   NA    Precautions / Restrictions Precautions Precautions: Knee Precaution Booklet Issued: Yes (comment) Precaution Comments: knee exercise handout given Required Braces or Orthoses: Knee Immobilizer - Left Restrictions Weight Bearing Restrictions: Yes LLE Weight Bearing: Weight bearing as tolerated      Mobility  Bed Mobility Overal bed mobility: Modified Independent                Transfers Overall transfer level: Needs assistance Equipment used: Rolling walker (2 wheeled) Transfers: Sit to/from Stand Sit to Stand: Min assist;Modified independent (Device/Increase time)         General transfer comment: Min assist from bed for first stand, mod I from [facility] in bathroom (higher surface with hand rails).  Verbal cues for safe hand placement.   Ambulation/Gait Ambulation/Gait assistance: Min assist Ambulation Distance (Feet): 25 Feet Assistive device: Rolling walker (2 wheeled) Gait Pattern/deviations: Step-to pattern;Antalgic     General Gait Details: the first few steps pt buckled over left leg due to numbness and weakness from surgery (KI donned in bed prior to mobilizing), however, she was able to use arms to support herself and reported the  more she walked, the better it felt (more stable).           Balance Overall balance assessment: Needs assistance Sitting-balance support: Feet supported;No upper extremity supported Sitting balance-Leahy Scale: Good     Standing balance support: Bilateral upper extremity supported;No upper extremity supported;Single extremity supported Standing balance-Leahy Scale: Fair                               Pertinent Vitals/Pain Pain Assessment: 0-10 Pain Score: 2  Pain Location: left knee Pain Descriptors / Indicators: Aching;Burning Pain Intervention(s): Limited activity within patient's tolerance;Monitored during session;Repositioned    Home Living Family/patient expects to be discharged to:: Private residence Living Arrangements: Spouse/significant other Available Help at Discharge: Family;Available 24 hours/day Type of Home: House Home Access: Stairs to enter Entrance Stairs-Rails: None Entrance Stairs-Number of Steps: 1 Home Layout: One level Home Equipment: Walker - 2 wheels;Bedside commode;Hand held shower head      Prior Function Level of Independence: Independent         Comments: still drives, does not workl     Hand Dominance   Dominant Hand: Right    Extremity/Trunk Assessment   Upper Extremity Assessment: Overall WFL for tasks assessed           Lower Extremity Assessment: RLE deficits/detail;LLE deficits/detail RLE Deficits / Details: right leg with recent uni knee earlier this year, strength and ROM WNL for tasks assessed.  LLE Deficits / Details: left leg with normal post op pain and weakness, still numb from surgery.  Pt with 3/5 ankle, 2/5  knee, 3-/5 hip flexion.   Cervical / Trunk Assessment: Normal  Communication   Communication: No difficulties  Cognition Arousal/Alertness: Awake/alert Behavior During Therapy: WFL for tasks assessed/performed Overall Cognitive Status: Within Functional Limits for tasks assessed                          Exercises Total Joint Exercises Ankle Circles/Pumps: AROM;Both;20 reps      Assessment/Plan    PT Assessment Patient needs continued PT services  PT Diagnosis Difficulty walking;Abnormality of gait;Generalized weakness;Acute pain   PT Problem List Decreased strength;Decreased range of motion;Decreased activity tolerance;Decreased balance;Decreased mobility;Decreased knowledge of use of DME;Decreased knowledge of precautions;Pain  PT Treatment Interventions DME instruction;Gait training;Stair training;Functional mobility training;Therapeutic activities;Therapeutic exercise;Balance training;Neuromuscular re-education;Patient/family education;Modalities;Manual techniques   PT Goals (Current goals can be found in the Care Plan section) Acute Rehab PT Goals Patient Stated Goal: to go home tomorrow PT Goal Formulation: With patient Time For Goal Achievement: 05/08/16 Potential to Achieve Goals: Good    Frequency 7X/week           End of Session Equipment Utilized During Treatment: Gait belt;Left knee immobilizer Activity Tolerance: Patient tolerated treatment well Patient left: in chair;with call bell/phone within reach      Functional Assessment Tool Used: assist levels Functional Limitation: Mobility: Walking and moving around Mobility: Walking and Moving Around Current Status (V8938): At least 20 percent but less than 40 percent impaired, limited or restricted Mobility: Walking and Moving Around Goal Status [phone]): At least 1 percent but less than 20 percent impaired, limited or restricted    Time: 1025-8527 PT Time Calculation (min) (ACUTE ONLY): 22 min   Charges:   PT Evaluation $PT Eval Moderate Complexity: 1 Procedure     PT G Codes:   PT G-Codes **NOT FOR INPATIENT CLASS** Functional Assessment Tool Used: assist levels Functional Limitation: Mobility: Walking and moving around Mobility: Walking and Moving Around Current Status (P8242): At  least 20 percent but less than 40 percent impaired, limited or restricted Mobility: Walking and Moving Around Goal Status [phone]): At least 1 percent but less than 20 percent impaired, limited or restricted    [name], [place], DPT [phone]   05/01/2016, 6:09 PM

## 2016-05-02 ENCOUNTER — Encounter (HOSPITAL_COMMUNITY): Payer: Self-pay | Admitting: General Practice

## 2016-05-02 DIAGNOSIS — M1712 Unilateral primary osteoarthritis, left knee: Secondary | ICD-10-CM | POA: Diagnosis not present

## 2016-05-02 DIAGNOSIS — I10 Essential (primary) hypertension: Secondary | ICD-10-CM | POA: Diagnosis not present

## 2016-05-02 DIAGNOSIS — E785 Hyperlipidemia, unspecified: Secondary | ICD-10-CM | POA: Diagnosis not present

## 2016-05-02 DIAGNOSIS — R739 Hyperglycemia, unspecified: Secondary | ICD-10-CM | POA: Diagnosis not present

## 2016-05-02 DIAGNOSIS — K5900 Constipation, unspecified: Secondary | ICD-10-CM | POA: Diagnosis not present

## 2016-05-02 DIAGNOSIS — M8580 Other specified disorders of bone density and structure, unspecified site: Secondary | ICD-10-CM | POA: Diagnosis not present

## 2016-05-02 LAB — CBC
HCT: 36.8 % (ref 36.0–46.0)
Hemoglobin: 12.3 g/dL (ref 12.0–15.0)
MCH: 31.8 pg (ref 26.0–34.0)
MCHC: 33.4 g/dL (ref 30.0–36.0)
MCV: 95.1 fL (ref 78.0–100.0)
PLATELETS: 201 10*3/uL (ref 150–400)
RBC: 3.87 MIL/uL (ref 3.87–5.11)
RDW: 12.7 % (ref 11.5–15.5)
WBC: 17.2 10*3/uL — AB (ref 4.0–10.5)

## 2016-05-02 LAB — BASIC METABOLIC PANEL
ANION GAP: 6 (ref 5–15)
BUN: 12 mg/dL (ref 6–20)
CALCIUM: 8.7 mg/dL — AB (ref 8.9–10.3)
CO2: 25 mmol/L (ref 22–32)
Chloride: 103 mmol/L (ref 101–111)
Creatinine, Ser: 0.78 mg/dL (ref 0.44–1.00)
GLUCOSE: 137 mg/dL — AB (ref 65–99)
POTASSIUM: 4.2 mmol/L (ref 3.5–5.1)
SODIUM: 134 mmol/L — AB (ref 135–145)

## 2016-05-02 MED ORDER — HYDROMORPHONE HCL 2 MG PO TABS: ORAL_TABLET | ORAL | 0 refills | Status: DC

## 2016-05-02 MED ORDER — ASPIRIN 325 MG PO TBEC: DELAYED_RELEASE_TABLET | ORAL | 0 refills | Status: DC

## 2016-05-02 MED ORDER — DOCUSATE SODIUM 100 MG PO CAPS: ORAL_CAPSULE | ORAL | 0 refills | Status: DC

## 2016-05-02 NOTE — Progress Notes (Signed)
Pt discharge papers reviewed and medications with full understanding

## 2016-05-02 NOTE — Discharge Summary (Signed)
Patient ID: [name] MRN: [id number] DOB/AGE: Mar 03, 1941 75 y.o.  Admit date: 05/01/2016 Discharge date: 05/02/2016  Admission Diagnoses:  Principal Problem:   Primary localized osteoarthritis of left knee Active Problems:   Cardiac conduction disorder   CN (constipation)   DD (diverticular disease)   Hemorrhoid   BP (high blood pressure)   Blood glucose elevated   HLD (hyperlipidemia)   Discharge Diagnoses:  Same  Past Medical History:  Diagnosis Date  . Blood glucose elevated 05/27/2012  . BP (high blood pressure) 12/03/2011  . Cancer ([place])    skin cancer  . 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 CN (constipation) 12/06/2015  . 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 GERD (gastroesophageal reflux disease)   . Hemorrhoid 12/06/2015  . History of hiatal hernia   . HLD (hyperlipidemia) 12/03/2011  . Hypertension   . Osteopenia 05/30/2015   Overview:  Bone density 05/2015 started fosamax   . Primary localized osteoarthritis of left knee   . Primary localized osteoarthritis of right knee 12/06/2015    Surgeries: Procedure(s): UNICOMPARTMENTAL KNEE on 05/01/2016   Consultants:   Discharged Condition: Improved  Hospital Course: [name] is an 75 y.o. female who was admitted 05/01/2016 for operative treatment ofPrimary localized osteoarthritis of left knee. Patient has severe unremitting pain that affects sleep, daily activities, and work/hobbies. After pre-op clearance the patient was taken to the operating room on 05/01/2016 and underwent  Procedure(s): UNICOMPARTMENTAL KNEE.    Patient was given perioperative antibiotics: Anti-infectives    Start     Dose/Rate Route Frequency Ordered Stop   05/01/16 1530  ceFAZolin (ANCEF)  IVPB 2g/100 mL premix     2 g 200 mL/hr over 30 Minutes Intravenous Every 6 hours 05/01/16 1344 05/01/16 2146   05/01/16 0642  ceFAZolin (ANCEF) 2-4 GM/100ML-% IVPB    Comments:  Ara Kussmaul   : cabinet override      05/01/16 0642 05/01/16 1859   05/01/16 0637  ceFAZolin (ANCEF) IVPB 2g/100 mL premix     2 g 200 mL/hr over 30 Minutes Intravenous On call to O.R. 05/01/16 4970 05/01/16 2637       Patient was given sequential compression devices, early ambulation, and chemoprophylaxis to prevent DVT.  Patient benefited maximally from hospital stay and there were no complications.    Recent vital signs: Patient Vitals for the past 24 hrs:  BP Temp Temp src Pulse Resp SpO2  05/02/16 0441 (!) 109/59 98 F (36.7 C) Oral (!) 52 16 93 %  05/01/16 2101 (!) 113/55 97.7 F (36.5 C) Oral 63 16 97 %  05/01/16 1500 (!) 99/50 97.6 F (36.4 C) Oral 64 - 98 %  05/01/16 1319 (!) 108/52 97.5 F (36.4 C) Axillary 62 16 97 %  05/01/16 1300 109/72 - - 60 16 99 %  05/01/16 1249 117/61 - - 68 18 98 %  05/01/16 1219 94/75 - - 66 18 99 %  05/01/16 1149 (!) 103/53 - - 60 16 99 %  05/01/16 1119 - 97.7 F (36.5 C) - - - 96 %     Recent laboratory studies:  Recent Labs  05/02/16 0517  WBC 17.2*  HGB 12.3  HCT 36.8  PLT 201  NA 134*  K 4.2  CL 103  CO2 25  BUN 12  CREATININE 0.78  GLUCOSE 137*  CALCIUM 8.7*     Discharge Medications:     Medication List    STOP taking these medications   multivitamin with minerals Tabs tablet     TAKE these medications   acetaminophen 500 MG tablet Commonly known as:  TYLENOL Take 1,000 mg by mouth 4 (four) times daily as needed for moderate pain.   aspirin 325 MG EC tablet 1 tab a day for the next 30 days to prevent blood clots   calcium carbonate 600 MG Tabs tablet Commonly known as:  OS-CAL Take 600 mg by mouth daily.   docusate sodium 100 MG capsule Commonly known as:  COLACE 1 tab 2 times a day while on narcotics.  STOOL  SOFTENER   HYDROmorphone 2 MG tablet Commonly known as:  DILAUDID 1-2 tablets every 4-6 hrs as needed for pain   metoprolol succinate 50 MG 24 hr tablet Commonly known as:  TOPROL-XL Take 50 mg by mouth daily. Take with or immediately following a meal.   polyethylene glycol packet Commonly known as:  MIRALAX / GLYCOLAX 17grams in 6 oz of water twice a day until bowel movement.  LAXITIVE.  Restart if two days since last bowel movement   simvastatin 20 MG tablet Commonly known as:  ZOCOR Take 20 mg by mouth daily.       Diagnostic Studies: No results found.  Disposition: 06-Home-Health Care Svc  Discharge Instructions    CPM    Complete by:  As directed   Continuous passive motion machine (CPM):      Use the CPM from 0 to 90 for 6 hours per day.       You may break it up into 2 or 3 sessions per day.      Use CPM for 2 weeks or until you are told to stop.   Call MD / Call 911    Complete by:  As directed   If you experience chest pain or shortness of breath, CALL 911 and be transported to the hospital emergency room.  If you develope a fever above 101 F, pus (white drainage) or increased drainage or redness at the wound, or calf pain, call your surgeon's office.   Change dressing    Complete by:  As directed   Change the gauze dressing daily with sterile 4 x 4 inch gauze and apply TED hose.  DO NOT REMOVE BANDAGE OVER SURGICAL INCISION.  [place] WHOLE LEG INCLUDING OVER THE WATERPROOF BANDAGE WITH SOAP AND WATER EVERY DAY.   Constipation Prevention    Complete by:  As directed   Drink plenty of fluids.  Prune juice may be helpful.  You may use a stool softener, such as Colace (over the counter) 100 mg twice a day.  Use MiraLax (over the counter) for constipation as needed.   Diet - low sodium heart healthy    Complete by:  As directed   Discharge instructions    Complete by:  As directed   INSTRUCTIONS AFTER JOINT REPLACEMENT   Remove items at home which could result in a fall.  This includes throw rugs or furniture in walking pathways ICE to the affected joint every three hours while awake for 30 minutes at a time, for at least the first 3-5 days, and then as needed for pain and swelling.  Continue to use ice for pain and swelling. You may notice swelling that  will progress down to the foot and ankle.  This is normal after surgery.  Elevate your leg when you are not up walking on it.   Continue to use the breathing machine you got in the hospital (incentive spirometer) which will help keep your temperature down.  It is common for your temperature to cycle up and down following surgery, especially at night when you are not up moving around and exerting yourself.  The breathing machine keeps your lungs expanded and your temperature down.   DIET:  As you were doing prior to hospitalization, we recommend a well-balanced diet.  DRESSING / WOUND CARE / SHOWERING  Keep the surgical dressing until follow up.  The dressing is water proof, so you can shower without any extra covering.  IF THE DRESSING FALLS OFF or the wound gets wet inside, change the dressing with sterile gauze.  Please use good hand washing techniques before changing the dressing.  Do not use any lotions or creams on the incision until instructed by your surgeon.    ACTIVITY  Increase activity slowly as tolerated, but follow the weight bearing instructions below.   No driving for 6 weeks or until further direction given by your physician.  You cannot drive while taking narcotics.  No lifting or carrying greater than 10 lbs. until further directed by your surgeon. Avoid periods of inactivity such as sitting longer than an hour when not asleep. This helps prevent blood clots.  You may return to work once you are authorized by your doctor.     WEIGHT BEARING   Weight bearing as tolerated with assist device (walker, cane, etc) as directed, use it as long as suggested by your surgeon or therapist, typically at  least 2-3 weeks.   EXERCISES  Results after joint replacement surgery are often greatly improved when you follow the exercise, range of motion and muscle strengthening exercises prescribed by your doctor. Safety measures are also important to protect the joint from further injury. Any time any of these exercises cause you to have increased pain or swelling, decrease what you are doing until you are comfortable again and then slowly increase them. If you have problems or questions, call your caregiver or physical therapist for advice.   Rehabilitation is important following a joint replacement. After just a few days of immobilization, the muscles of the leg can become weakened and shrink (atrophy).  These exercises are designed to build up the tone and strength of the thigh and leg muscles and to improve motion. Often times heat used for twenty to thirty minutes before working out will loosen up your tissues and help with improving the range of motion but do not use heat for the first two weeks following surgery (sometimes heat can increase post-operative swelling).   These exercises can be done on a training (exercise) mat, on the floor, on a table or on a bed. Use whatever works the best and is most comfortable for you.    Use music or television while you are exercising so that the exercises are a pleasant break in your day. This will make your life better with the exercises acting as a break in your routine that you can look forward to.   Perform all exercises about fifteen times, three times per day or as directed.  You should exercise both the operative leg and the other leg as well.   Exercises include:  Quad Sets - Tighten up the muscle on the front of the thigh (  Quad) and hold for 5-10 seconds.   Straight Leg Raises - With your knee straight (if you were given a brace, keep it on), lift the leg to 60 degrees, hold for 3 seconds, and slowly lower the leg.  Perform this exercise against  resistance later as your leg gets stronger.  Leg Slides: Lying on your back, slowly slide your foot toward your buttocks, bending your knee up off the floor (only go as far as is comfortable). Then slowly slide your foot back down until your leg is flat on the floor again.  Angel Wings: Lying on your back spread your legs to the side as far apart as you can without causing discomfort.  Hamstring Strength:  Lying on your back, push your heel against the floor with your leg straight by tightening up the muscles of your buttocks.  Repeat, but this time bend your knee to a comfortable angle, and push your heel against the floor.  You may put a pillow under the heel to make it more comfortable if necessary.   A rehabilitation program following joint replacement surgery can speed recovery and prevent re-injury in the future due to weakened muscles. Contact your doctor or a physical therapist for more information on knee rehabilitation.    CONSTIPATION  Constipation is defined medically as fewer than three stools per week and severe constipation as less than one stool per week.  Even if you have a regular bowel pattern at home, your normal regimen is likely to be disrupted due to multiple reasons following surgery.  Combination of anesthesia, postoperative narcotics, change in appetite and fluid intake all can affect your bowels.   YOU MUST use at least one of the following options; they are listed in order of increasing strength to get the job done.  They are all available over the counter, and you may need to use some, POSSIBLY even all of these options:    Drink plenty of fluids (prune juice may be helpful) and high fiber foods Colace 100 mg by mouth twice a day  Senokot for constipation as directed and as needed Dulcolax (bisacodyl), take with full glass of water  Miralax (polyethylene glycol) once or twice a day as needed.  If you have tried all these things and are unable to have a bowel movement  in the first 3-4 days after surgery call either your surgeon or your primary doctor.    If you experience loose stools or diarrhea, hold the medications until you stool forms back up.  If your symptoms do not get better within 1 week or if they get worse, check with your doctor.  If you experience "the worst abdominal pain ever" or develop nausea or vomiting, please contact the office immediately for further recommendations for treatment.   ITCHING:  If you experience itching with your medications, try taking only a single pain pill, or even half a pain pill at a time.  You can also use Benadryl over the counter for itching or also to help with sleep.   TED HOSE STOCKINGS:  Use stockings on both legs until for at least 2 weeks or as directed by physician office. They may be removed at night for sleeping.  MEDICATIONS:  See your medication summary on the "After Visit Summary" that nursing will review with you.  You may have some home medications which will be placed on hold until you complete the course of blood thinner medication.  It is important for you to complete the  blood thinner medication as prescribed.  PRECAUTIONS:  If you experience chest pain or shortness of breath - call 911 immediately for transfer to the hospital emergency department.   If you develop a fever greater that 101 F, purulent drainage from wound, increased redness or drainage from wound, foul odor from the wound/dressing, or calf pain - CONTACT YOUR SURGEON.                                                   FOLLOW-UP APPOINTMENTS:  If you do not already have a post-op appointment, please call the office for an appointment to be seen by your surgeon.  Guidelines for how soon to be seen are listed in your "After Visit Summary", but are typically between 1-4 weeks after surgery.  OTHER INSTRUCTIONS:   Knee Replacement:  Do not place pillow under knee, focus on keeping the knee straight while resting. CPM instructions: 0-90  degrees, 2 hours in the morning, 2 hours in the afternoon, and 2 hours in the evening. Place foam block, curve side up under heel at all times except when in CPM or when walking.  DO NOT modify, tear, cut, or change the foam block in any way.  MAKE SURE YOU:  Understand these instructions.  Get help right away if you are not doing well or get worse.    Thank you for letting us be a part of your medical care team.  It is a privilege we respect greatly.  We hope these instructions will help you stay on track for a fast and full recovery!   Do not put a pillow under the knee. Place it under the heel.    Complete by:  As directed   Place gray foam block, curve side up under heel at all times except when in CPM or when walking.  DO NOT modify, tear, cut, or change in any way the gray foam block.   Increase activity slowly as tolerated    Complete by:  As directed   Patient may shower    Complete by:  As directed   Aquacel dressing is water proof    Wash over it and the whole leg with soap and water at the end of your shower   TED hose    Complete by:  As directed   Use stockings (TED hose) for 2 weeks on both leg(s).  You may remove them at night for sleeping.      Follow-up Information    Lorn Junes, MD Follow up on 05/14/2016.   Specialty:  Orthopedic Surgery Why:  appointment time 10 am Contact information: [place] [address] [facility] Follow up on 05/15/2016.   Specialty:  Orthopedic Surgery Why:  appointment time 12:30 Charlotte Sanes  PT Contact information: [address] [place] [address] [phone]            Signed: Linda Hedges 05/02/2016, 9:35 AM

## 2016-05-02 NOTE — Progress Notes (Signed)
Orthopedic Tech Progress Note Patient Details:  [name] [birth year] [id number]  Patient ID: [name], female   DOB: 09/27/1940, 75 y.o.   MRN: [id number] Applied cpm 0-60  Karolee Stamps 05/02/2016, 5:46 AM

## 2016-05-02 NOTE — Op Note (Signed)
[name], Fisher          ACCOUNT NO.:  [id number]  MEDICAL RECORD NO.:  [id number]  LOCATION:  [id number]                        FACILITY:  [facility]  PHYSICIAN:  Herbie Baltimore A. [name], M.D. 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  PROCEDURE:  Left knee exam under anesthesia, followed by medial compartment and unicompartmental arthroplasty using [place] with a size small femur cemented, size A cemented tibia, with #5 polyethylene insert.  SURGEON:  Audree Camel. [name], M.D.  ASSISTANT:  Kirstin Shepperson, PA-C.  ANESTHESIA:  Spinal.  OPERATIVE TIME:  1 hour and 15 minutes.  COMPLICATIONS:  None.  DESCRIPTION OF PROCEDURE:  Ms. [name] was brought to the operating room on May 01, 2016, after an adductor canal block was placed in the holding room by Anesthesia.  She was placed on the operating table in supine position.  She received spinal anesthetic by Anesthesia without complications.  Her left knee was examined.  She had full range of motion with mild varus deformity.  Knee was stable with normal patellar tracking.  Left leg was prepped using sterile DuraPrep and draped using sterile technique.  Time-out procedure was called and the correct left knee identified.  Left leg was exsanguinated and tourniquet elevated to 350 mm.  Initially, through a 5-6 cm anterior incision based just medial to the patellar tendon, initial exposure was made.  Underlying subcutaneous tissues were incised along with skin incision.  The median arthrotomy was performed.  The medial compartment was inspected.  She had grade 4 changes throughout.  Intercondylar notch inspected, anterior- posterior cruciate ligaments were normal.   Lateral compartment inspected.  The articular cartilage was intact.  Patellofemoral joint showed grade 1 and 2 changes.  At this point, the medial meniscus was removed.  The size small sizing smooth was then placed in the medial compartment and this was then attached to the tibial cutting jig in the appropriate manner of rotation and slope, and then the proximal tibial cuts were made.  Size A was found to be the appropriate size for the proximal tibia.  After this was done, then an intramedullary drill was drilled up the femoral canal and then an intramedullary rod was placed and this was linked to the distal femoral sizer and jig and 2 drill holes were placed through this jig in the appropriate position on the distal femur.  This was then removed and the 0 spigot was placed in the distal femoral hole and this first reaming was carried out.  After this was done, then the trial components were placed with the size A tibia and small femur, and with a #5 polyethylene spacer in flexion this was found to be the appropriate size, however, in full extension only a size 2 spacer could be placed and thus the trial components were removed and a #3 spigot was placed back on the distal femur and this reaming was carried out.  After this was done, the trial components were then replaced and a #5 polyethylene spacer  was placed and gave excellent positioning and stability in flexion and extension.  At this point, then the tibial keel cut was made.  After this was done, then the femoral impingement device was placed and then these reamings and cuts were made.  After this was done, then the trial components were then replaced again and the #5 polyethylene spacer placed.  Knee brought through a full range of motion and found to be stable with excellent correction of her varus deformity.  At this point, I felt that all the trial components were of excellent size, fit, and stability.  They were then removed.   The knee was then jet lavage irrigated with 3 L of saline, and then the tibial component with cement backing was hammered in position with an excellent fit with excess cement being removed from around the edges.  The femoral component with cement backing was hammered in position also with an excellent fit with excess cement being removed from around the edges.  A #5 polyethylene spacer was then placed and the knee brought through a full range of motion and found to be stable with excellent excursion, excellent correction of her varus deformity.  At this point, I felt that all the components were of excellent size, fit, and stability.  The wound was further irrigated and then the arthrotomy was closed with #1 Vicryl suture, subcutaneous tissues closed with 0 and 2-0 Vicryl, subcuticular layer closed with 4 Monocryl.  Sterile dressings were applied and the tourniquet was released, awakened and taken to recovery room in stable condition.  Needle and sponge counts were correct x2 at the end of the case.     Dorathea Faerber A. [name], M.D.     RAW/MEDQ  D:  05/02/2016  T:  05/02/2016  Job:  [id number]

## 2016-05-03 DIAGNOSIS — Z96653 Presence of artificial knee joint, bilateral: Secondary | ICD-10-CM | POA: Diagnosis not present

## 2016-05-03 DIAGNOSIS — I10 Essential (primary) hypertension: Secondary | ICD-10-CM | POA: Diagnosis not present

## 2016-05-03 DIAGNOSIS — F17200 Nicotine dependence, unspecified, uncomplicated: Secondary | ICD-10-CM | POA: Diagnosis not present

## 2016-05-03 DIAGNOSIS — M8580 Other specified disorders of bone density and structure, unspecified site: Secondary | ICD-10-CM | POA: Diagnosis not present

## 2016-05-03 DIAGNOSIS — Z471 Aftercare following joint replacement surgery: Secondary | ICD-10-CM | POA: Diagnosis not present

## 2016-05-06 DIAGNOSIS — Z96653 Presence of artificial knee joint, bilateral: Secondary | ICD-10-CM | POA: Diagnosis not present

## 2016-05-06 DIAGNOSIS — M8580 Other specified disorders of bone density and structure, unspecified site: Secondary | ICD-10-CM | POA: Diagnosis not present

## 2016-05-06 DIAGNOSIS — I10 Essential (primary) hypertension: Secondary | ICD-10-CM | POA: Diagnosis not present

## 2016-05-06 DIAGNOSIS — F17200 Nicotine dependence, unspecified, uncomplicated: Secondary | ICD-10-CM | POA: Diagnosis not present

## 2016-05-06 DIAGNOSIS — Z471 Aftercare following joint replacement surgery: Secondary | ICD-10-CM | POA: Diagnosis not present

## 2016-05-08 DIAGNOSIS — Z471 Aftercare following joint replacement surgery: Secondary | ICD-10-CM | POA: Diagnosis not present

## 2016-05-08 DIAGNOSIS — Z96653 Presence of artificial knee joint, bilateral: Secondary | ICD-10-CM | POA: Diagnosis not present

## 2016-05-08 DIAGNOSIS — M8580 Other specified disorders of bone density and structure, unspecified site: Secondary | ICD-10-CM | POA: Diagnosis not present

## 2016-05-08 DIAGNOSIS — I10 Essential (primary) hypertension: Secondary | ICD-10-CM | POA: Diagnosis not present

## 2016-05-08 DIAGNOSIS — F17200 Nicotine dependence, unspecified, uncomplicated: Secondary | ICD-10-CM | POA: Diagnosis not present

## 2016-05-10 DIAGNOSIS — Z471 Aftercare following joint replacement surgery: Secondary | ICD-10-CM | POA: Diagnosis not present

## 2016-05-10 DIAGNOSIS — M8580 Other specified disorders of bone density and structure, unspecified site: Secondary | ICD-10-CM | POA: Diagnosis not present

## 2016-05-10 DIAGNOSIS — I10 Essential (primary) hypertension: Secondary | ICD-10-CM | POA: Diagnosis not present

## 2016-05-10 DIAGNOSIS — Z96653 Presence of artificial knee joint, bilateral: Secondary | ICD-10-CM | POA: Diagnosis not present

## 2016-05-10 DIAGNOSIS — F17200 Nicotine dependence, unspecified, uncomplicated: Secondary | ICD-10-CM | POA: Diagnosis not present

## 2016-05-14 DIAGNOSIS — M1712 Unilateral primary osteoarthritis, left knee: Secondary | ICD-10-CM | POA: Diagnosis not present

## 2016-05-14 DIAGNOSIS — Z96653 Presence of artificial knee joint, bilateral: Secondary | ICD-10-CM | POA: Diagnosis not present

## 2016-05-14 DIAGNOSIS — I10 Essential (primary) hypertension: Secondary | ICD-10-CM | POA: Diagnosis not present

## 2016-05-14 DIAGNOSIS — F17200 Nicotine dependence, unspecified, uncomplicated: Secondary | ICD-10-CM | POA: Diagnosis not present

## 2016-05-14 DIAGNOSIS — Z471 Aftercare following joint replacement surgery: Secondary | ICD-10-CM | POA: Diagnosis not present

## 2016-05-14 DIAGNOSIS — M8580 Other specified disorders of bone density and structure, unspecified site: Secondary | ICD-10-CM | POA: Diagnosis not present

## 2016-05-15 DIAGNOSIS — R262 Difficulty in walking, not elsewhere classified: Secondary | ICD-10-CM | POA: Diagnosis not present

## 2016-05-15 DIAGNOSIS — M25562 Pain in left knee: Secondary | ICD-10-CM | POA: Diagnosis not present

## 2016-05-15 DIAGNOSIS — Z96652 Presence of left artificial knee joint: Secondary | ICD-10-CM | POA: Diagnosis not present

## 2016-05-15 DIAGNOSIS — M25662 Stiffness of left knee, not elsewhere classified: Secondary | ICD-10-CM | POA: Diagnosis not present

## 2016-05-16 ENCOUNTER — Other Ambulatory Visit (HOSPITAL_COMMUNITY): Payer: Self-pay | Admitting: Orthopedic Surgery

## 2016-05-16 ENCOUNTER — Other Ambulatory Visit (HOSPITAL_COMMUNITY): Payer: Self-pay | Admitting: Internal Medicine

## 2016-05-16 ENCOUNTER — Ambulatory Visit (HOSPITAL_COMMUNITY)
Admission: RE | Admit: 2016-05-16 | Discharge: 2016-05-16 | Disposition: A | Discharge status: Home / Self Care | Payer: Medicare Other | Source: Ambulatory Visit | Attending: Cardiovascular Disease | Admitting: Cardiovascular Disease

## 2016-05-16 DIAGNOSIS — R609 Edema, unspecified: Secondary | ICD-10-CM

## 2016-05-16 DIAGNOSIS — R52 Pain, unspecified: Secondary | ICD-10-CM

## 2016-05-16 DIAGNOSIS — Z720 Tobacco use: Secondary | ICD-10-CM | POA: Diagnosis not present

## 2016-05-16 DIAGNOSIS — M79605 Pain in left leg: Secondary | ICD-10-CM | POA: Diagnosis not present

## 2016-05-16 DIAGNOSIS — E785 Hyperlipidemia, unspecified: Secondary | ICD-10-CM | POA: Insufficient documentation

## 2016-05-17 DIAGNOSIS — Z96652 Presence of left artificial knee joint: Secondary | ICD-10-CM | POA: Diagnosis not present

## 2016-05-17 DIAGNOSIS — M25562 Pain in left knee: Secondary | ICD-10-CM | POA: Diagnosis not present

## 2016-05-17 DIAGNOSIS — M25662 Stiffness of left knee, not elsewhere classified: Secondary | ICD-10-CM | POA: Diagnosis not present

## 2016-05-17 DIAGNOSIS — R262 Difficulty in walking, not elsewhere classified: Secondary | ICD-10-CM | POA: Diagnosis not present

## 2016-05-20 DIAGNOSIS — Z96652 Presence of left artificial knee joint: Secondary | ICD-10-CM | POA: Diagnosis not present

## 2016-05-20 DIAGNOSIS — M25662 Stiffness of left knee, not elsewhere classified: Secondary | ICD-10-CM | POA: Diagnosis not present

## 2016-05-20 DIAGNOSIS — R262 Difficulty in walking, not elsewhere classified: Secondary | ICD-10-CM | POA: Diagnosis not present

## 2016-05-20 DIAGNOSIS — M25562 Pain in left knee: Secondary | ICD-10-CM | POA: Diagnosis not present

## 2016-05-23 DIAGNOSIS — M25662 Stiffness of left knee, not elsewhere classified: Secondary | ICD-10-CM | POA: Diagnosis not present

## 2016-05-23 DIAGNOSIS — Z96652 Presence of left artificial knee joint: Secondary | ICD-10-CM | POA: Diagnosis not present

## 2016-05-23 DIAGNOSIS — M25562 Pain in left knee: Secondary | ICD-10-CM | POA: Diagnosis not present

## 2016-05-23 DIAGNOSIS — R262 Difficulty in walking, not elsewhere classified: Secondary | ICD-10-CM | POA: Diagnosis not present

## 2016-05-28 DIAGNOSIS — Z96652 Presence of left artificial knee joint: Secondary | ICD-10-CM | POA: Diagnosis not present

## 2016-05-28 DIAGNOSIS — M25662 Stiffness of left knee, not elsewhere classified: Secondary | ICD-10-CM | POA: Diagnosis not present

## 2016-05-28 DIAGNOSIS — R262 Difficulty in walking, not elsewhere classified: Secondary | ICD-10-CM | POA: Diagnosis not present

## 2016-05-28 DIAGNOSIS — M25562 Pain in left knee: Secondary | ICD-10-CM | POA: Diagnosis not present

## 2016-05-30 DIAGNOSIS — M25662 Stiffness of left knee, not elsewhere classified: Secondary | ICD-10-CM | POA: Diagnosis not present

## 2016-05-30 DIAGNOSIS — M25562 Pain in left knee: Secondary | ICD-10-CM | POA: Diagnosis not present

## 2016-05-30 DIAGNOSIS — Z96652 Presence of left artificial knee joint: Secondary | ICD-10-CM | POA: Diagnosis not present

## 2016-05-30 DIAGNOSIS — R262 Difficulty in walking, not elsewhere classified: Secondary | ICD-10-CM | POA: Diagnosis not present

## 2016-06-04 DIAGNOSIS — Z96652 Presence of left artificial knee joint: Secondary | ICD-10-CM | POA: Diagnosis not present

## 2016-06-04 DIAGNOSIS — M25662 Stiffness of left knee, not elsewhere classified: Secondary | ICD-10-CM | POA: Diagnosis not present

## 2016-06-04 DIAGNOSIS — R262 Difficulty in walking, not elsewhere classified: Secondary | ICD-10-CM | POA: Diagnosis not present

## 2016-06-04 DIAGNOSIS — M25562 Pain in left knee: Secondary | ICD-10-CM | POA: Diagnosis not present

## 2016-06-06 DIAGNOSIS — M25662 Stiffness of left knee, not elsewhere classified: Secondary | ICD-10-CM | POA: Diagnosis not present

## 2016-06-06 DIAGNOSIS — R262 Difficulty in walking, not elsewhere classified: Secondary | ICD-10-CM | POA: Diagnosis not present

## 2016-06-06 DIAGNOSIS — M25562 Pain in left knee: Secondary | ICD-10-CM | POA: Diagnosis not present

## 2016-06-06 DIAGNOSIS — Z96652 Presence of left artificial knee joint: Secondary | ICD-10-CM | POA: Diagnosis not present

## 2016-06-11 DIAGNOSIS — Z96652 Presence of left artificial knee joint: Secondary | ICD-10-CM | POA: Diagnosis not present

## 2016-07-04 ENCOUNTER — Other Ambulatory Visit: Payer: Self-pay | Admitting: Family Medicine

## 2016-07-04 DIAGNOSIS — Z1231 Encounter for screening mammogram for malignant neoplasm of breast: Secondary | ICD-10-CM

## 2016-07-09 DIAGNOSIS — Z96652 Presence of left artificial knee joint: Secondary | ICD-10-CM | POA: Diagnosis not present

## 2016-07-31 ENCOUNTER — Ambulatory Visit: Payer: Medicare Other

## 2016-09-12 DIAGNOSIS — Z96652 Presence of left artificial knee joint: Secondary | ICD-10-CM | POA: Diagnosis not present

## 2016-10-09 DIAGNOSIS — Z23 Encounter for immunization: Secondary | ICD-10-CM | POA: Diagnosis not present

## 2017-01-28 DIAGNOSIS — Z85828 Personal history of other malignant neoplasm of skin: Secondary | ICD-10-CM | POA: Diagnosis not present

## 2017-01-28 DIAGNOSIS — L2089 Other atopic dermatitis: Secondary | ICD-10-CM | POA: Diagnosis not present

## 2017-01-28 DIAGNOSIS — L821 Other seborrheic keratosis: Secondary | ICD-10-CM | POA: Diagnosis not present

## 2017-01-31 ENCOUNTER — Encounter (HOSPITAL_COMMUNITY): Payer: Self-pay | Admitting: Emergency Medicine

## 2017-01-31 DIAGNOSIS — Z7982 Long term (current) use of aspirin: Secondary | ICD-10-CM | POA: Diagnosis not present

## 2017-01-31 DIAGNOSIS — Z79899 Other long term (current) drug therapy: Secondary | ICD-10-CM | POA: Diagnosis not present

## 2017-01-31 DIAGNOSIS — F17210 Nicotine dependence, cigarettes, uncomplicated: Secondary | ICD-10-CM | POA: Insufficient documentation

## 2017-01-31 DIAGNOSIS — R1110 Vomiting, unspecified: Secondary | ICD-10-CM | POA: Diagnosis not present

## 2017-01-31 DIAGNOSIS — Z85828 Personal history of other malignant neoplasm of skin: Secondary | ICD-10-CM | POA: Diagnosis not present

## 2017-01-31 DIAGNOSIS — I10 Essential (primary) hypertension: Secondary | ICD-10-CM | POA: Diagnosis not present

## 2017-01-31 DIAGNOSIS — K5790 Diverticulosis of intestine, part unspecified, without perforation or abscess without bleeding: Secondary | ICD-10-CM | POA: Diagnosis not present

## 2017-01-31 DIAGNOSIS — K529 Noninfective gastroenteritis and colitis, unspecified: Secondary | ICD-10-CM | POA: Diagnosis not present

## 2017-01-31 DIAGNOSIS — R1030 Lower abdominal pain, unspecified: Secondary | ICD-10-CM | POA: Diagnosis present

## 2017-01-31 LAB — URINALYSIS, ROUTINE W REFLEX MICROSCOPIC
Bacteria, UA: NONE SEEN
Bilirubin Urine: NEGATIVE
Glucose, UA: NEGATIVE mg/dL
Ketones, ur: 20 mg/dL — AB
LEUKOCYTES UA: NEGATIVE
Nitrite: NEGATIVE
PROTEIN: 30 mg/dL — AB
SPECIFIC GRAVITY, URINE: 1.018 (ref 1.005–1.030)
pH: 6 (ref 5.0–8.0)

## 2017-01-31 LAB — COMPREHENSIVE METABOLIC PANEL
ALT: 6 U/L — AB (ref 14–54)
AST: 16 U/L (ref 15–41)
Albumin: 3.7 g/dL (ref 3.5–5.0)
Alkaline Phosphatase: 58 U/L (ref 38–126)
Anion gap: 9 (ref 5–15)
BILIRUBIN TOTAL: 0.8 mg/dL (ref 0.3–1.2)
BUN: 9 mg/dL (ref 6–20)
CO2: 24 mmol/L (ref 22–32)
CREATININE: 0.81 mg/dL (ref 0.44–1.00)
Calcium: 9.1 mg/dL (ref 8.9–10.3)
Chloride: 99 mmol/L — ABNORMAL LOW (ref 101–111)
GFR calc Af Amer: >60 mL/min (ref >60)
GFR calc non Af Amer: >60 mL/min (ref >60)
GLUCOSE: 158 mg/dL — AB (ref 65–99)
Potassium: 3.4 mmol/L — ABNORMAL LOW (ref 3.5–5.1)
Sodium: 132 mmol/L — ABNORMAL LOW (ref 135–145)
TOTAL PROTEIN: 6.3 g/dL — AB (ref 6.5–8.1)

## 2017-01-31 LAB — CBC
HCT: 45.1 % (ref 36.0–46.0)
Hemoglobin: 15.3 g/dL — ABNORMAL HIGH (ref 12.0–15.0)
MCH: 31.9 pg (ref 26.0–34.0)
MCHC: 33.9 g/dL (ref 30.0–36.0)
MCV: 94.2 fL (ref 78.0–100.0)
PLATELETS: 221 10*3/uL (ref 150–400)
RBC: 4.79 MIL/uL (ref 3.87–5.11)
RDW: 12.7 % (ref 11.5–15.5)
WBC: 13 10*3/uL — ABNORMAL HIGH (ref 4.0–10.5)

## 2017-01-31 LAB — LIPASE, BLOOD: Lipase: 24 U/L (ref 11–51)

## 2017-01-31 MED ORDER — ONDANSETRON 4 MG PO TBDP
ORAL_TABLET | ORAL | Status: AC
  Filled 2017-01-31: qty 1

## 2017-01-31 MED ORDER — ONDANSETRON 4 MG PO TBDP
4.0000 mg | ORAL_TABLET | Freq: Once | ORAL | Status: AC | PRN
  Administered 2017-01-31: 4 mg via ORAL

## 2017-01-31 NOTE — ED Triage Notes (Signed)
Patient presents to ED for assessment of diarrhea since Tuesday, with the addition of vomiting on Thursday after developing abdominal pain.  Patient c/o generalized abdominal pain, but with a "more sore spot" to her right lower abdomen.

## 2017-02-01 ENCOUNTER — Emergency Department (HOSPITAL_COMMUNITY)
Admission: EM | Admit: 2017-02-01 | Discharge: 2017-02-01 | Disposition: A | Discharge status: Home / Self Care | Payer: Medicare Other | Attending: Emergency Medicine | Admitting: Emergency Medicine

## 2017-02-01 ENCOUNTER — Emergency Department (HOSPITAL_COMMUNITY): Payer: Medicare Other

## 2017-02-01 ENCOUNTER — Encounter (HOSPITAL_COMMUNITY): Payer: Self-pay

## 2017-02-01 DIAGNOSIS — K529 Noninfective gastroenteritis and colitis, unspecified: Secondary | ICD-10-CM | POA: Diagnosis not present

## 2017-02-01 DIAGNOSIS — K5790 Diverticulosis of intestine, part unspecified, without perforation or abscess without bleeding: Secondary | ICD-10-CM | POA: Diagnosis not present

## 2017-02-01 MED ORDER — ONDANSETRON HCL 4 MG/2ML IJ SOLN
4.0000 mg | Freq: Once | INTRAMUSCULAR | Status: AC
  Administered 2017-02-01: 4 mg via INTRAVENOUS
  Filled 2017-02-01: qty 2

## 2017-02-01 MED ORDER — KETOROLAC TROMETHAMINE 30 MG/ML IJ SOLN
15.0000 mg | Freq: Once | INTRAMUSCULAR | Status: AC
  Administered 2017-02-01: 15 mg via INTRAVENOUS
  Filled 2017-02-01: qty 1

## 2017-02-01 MED ORDER — SODIUM CHLORIDE 0.9 % IV BOLUS (SEPSIS)
1000.0000 mL | Freq: Once | INTRAVENOUS | Status: AC
  Administered 2017-02-01: 1000 mL via INTRAVENOUS

## 2017-02-01 MED ORDER — IOPAMIDOL (ISOVUE-300) INJECTION 61%
INTRAVENOUS | Status: AC
  Administered 2017-02-01: 100 mL
  Filled 2017-02-01: qty 100

## 2017-02-01 MED ORDER — ONDANSETRON 8 MG PO TBDP: ORAL_TABLET | ORAL | 0 refills | Status: DC

## 2017-02-01 MED ORDER — FENTANYL CITRATE (PF) 100 MCG/2ML IJ SOLN
50.0000 ug | Freq: Once | INTRAMUSCULAR | Status: AC
  Administered 2017-02-01: 50 ug via INTRAVENOUS
  Filled 2017-02-01: qty 2

## 2017-02-01 NOTE — ED Notes (Addendum)
Patient's spouse updated on delays

## 2017-02-01 NOTE — ED Provider Notes (Signed)
[facility] DEPT Provider Note   CSN: [id number] Arrival date & time: 01/31/17  2141 By signing my name below, I, Dyke Brackett, attest that this documentation has been prepared under the direction and in the presence of [place], Haig Gerardo, MD . Electronically Signed: Dyke Brackett, Scribe. 02/01/2017. 2:27 AM.   History   Chief Complaint Chief Complaint  Patient presents with  . Abdominal Pain    HPI [name] is a 76 y.o. female who presents to the Emergency Department complaining of persistent gradually worsening abdominal pain onset three days ago.She reports associated frequent, progressively worsening nausea and vomiting x2 days. Per pt, she has been unable to keep food or liquids down since symptoms began. She also reports some diarrhea three nights ago, but this resolved after taking Imodium. Pt reports that her grandson has similar symptoms. She denies any cough, congestion, fevers, or generalized body aches.   The history is provided by the patient. No language interpreter was used.  Abdominal Pain   This is a new problem. The current episode started more than 2 days ago. The problem occurs constantly. The problem has been gradually worsening. The pain is associated with an unknown factor. The pain is located in the generalized abdominal region. The pain is moderate. Associated symptoms include diarrhea, flatus and vomiting. Pertinent negatives include fever and myalgias. Nothing relieves the symptoms. Past workup does not include barium enema. Her past medical history does not include GERD or irritable bowel syndrome.   Past Medical History:  Diagnosis Date  . Blood glucose elevated 05/27/2012  . BP (high blood pressure) 12/03/2011  . Cancer ([place])    skin cancer  . 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 CN (constipation) 12/06/2015  . 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 GERD (gastroesophageal reflux disease)   . Hemorrhoid 12/06/2015  . History of hiatal hernia   . HLD (hyperlipidemia) 12/03/2011  . Hypertension   . Osteopenia 05/30/2015   Overview:  Bone density 05/2015 started fosamax   . Primary localized osteoarthritis of left knee   . Primary localized osteoarthritis of right knee 12/06/2015   Patient Active Problem List   Diagnosis Date Noted  . Primary localized osteoarthritis of left knee   . 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 CN (constipation) 12/06/2015  . 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 Hemorrhoid 12/06/2015  . Primary localized osteoarthritis of right knee 12/06/2015  . Osteopenia 05/30/2015  . Blood glucose elevated 05/27/2012  . BP (high blood pressure) 12/03/2011  . HLD (hyperlipidemia) 12/03/2011    Past Surgical History:  Procedure Laterality Date  . ABDOMINAL HYSTERECTOMY    . CATARACT EXTRACTION, BILATERAL    . COLONOSCOPY    . EYE SURGERY    . KNEE SURGERY  05/01/2016   left uncompartmental   . PARTIAL KNEE ARTHROPLASTY Right 12/18/2015   Procedure: UNICOMPARTMENTAL RIGHT KNEE;  Surgeon: Elsie Saas, MD;  Location: [facility];  Service: Orthopedics;  Laterality: Right;  . PARTIAL KNEE ARTHROPLASTY Left 05/01/2016   Procedure: UNICOMPARTMENTAL KNEE;  Surgeon: Elsie Saas, MD;  Location: [facility];  Service: Orthopedics;  Laterality: Left;  . TOTAL ABDOMINAL HYSTERECTOMY W/ BILATERAL SALPINGOOPHORECTOMY      OB History    No data available  Home Medications    Prior to Admission medications   Medication Sig Start Date End Date Taking? Authorizing Provider  acetaminophen (TYLENOL) 500 MG tablet Take 1,000 mg by mouth 4 (four) times daily as needed for moderate pain.    [provider]  aspirin EC 325 MG EC tablet 1 tab a day for the next 30 days to prevent blood clots  05/02/16   Shepperson, Kirstin, PA-C  calcium carbonate (OS-CAL) 600 MG TABS tablet Take 600 mg by mouth daily.    [provider]  docusate sodium (COLACE) 100 MG capsule 1 tab 2 times a day while on narcotics.  STOOL SOFTENER 05/02/16   Shepperson, Kirstin, PA-C  HYDROmorphone (DILAUDID) 2 MG tablet 1-2 tablets every 4-6 hrs as needed for pain 05/02/16   Shepperson, Kirstin, PA-C  metoprolol succinate (TOPROL-XL) 50 MG 24 hr tablet Take 50 mg by mouth daily. Take with or immediately following a meal.    [provider]  polyethylene glycol (MIRALAX / GLYCOLAX) packet 17grams in 6 oz of water twice a day until bowel movement.  LAXITIVE.  Restart if two days since last bowel movement 12/19/15   Shepperson, Kirstin, PA-C  simvastatin (ZOCOR) 20 MG tablet Take 20 mg by mouth daily.    [provider]   Family History History reviewed. No pertinent family history.  Social History Social History  Substance Use Topics  . Smoking status: Current Every Day Smoker    Packs/day: 0.25    Years: 50.00    Types: Cigarettes  . Smokeless tobacco: Never Used  . Alcohol use Yes     Comment: social rare   Allergies   Sulfa antibiotics; Codeine; Hydrocodone; and Meperidine  Review of Systems Review of Systems  Constitutional: Negative for fever.  HENT: Negative for congestion.   Respiratory: Negative for cough.   Gastrointestinal: Positive for abdominal pain, diarrhea, flatus and vomiting.  Musculoskeletal: Negative for myalgias.  All other systems reviewed and are negative.  Physical Exam Updated Vital Signs BP (!) 160/87 (BP Location: Right Arm)   Pulse 66   Temp 97.7 F (36.5 C) (Oral)   Resp 18   Ht 5\' 2"  (1.575 m)   Wt 139 lb (63 kg)   SpO2 100%   BMI 25.42 kg/m   Physical Exam  Constitutional: She is oriented to person, place, and time. She appears well-developed and well-nourished. No distress.  HENT:  Head: Normocephalic and atraumatic.  Mouth/Throat:  Oropharynx is clear and moist. No oropharyngeal exudate.  Moist mucous membranes   Eyes: Conjunctivae are normal. Pupils are equal, round, and reactive to light.  Neck: Normal range of motion. Neck supple. No JVD present.  Trachea midline No bruit  Cardiovascular: Normal rate, regular rhythm, normal heart sounds and intact distal pulses.   Pulmonary/Chest: Effort normal. No stridor. No respiratory distress. She has no wheezes. She has no rales.  Abdominal: Soft. Bowel sounds are normal. She exhibits no distension and no mass. There is no tenderness. There is no rebound and no guarding.  Musculoskeletal: Normal range of motion. She exhibits no edema or deformity.  All compartments soft.   Neurological: She is alert and oriented to person, place, and time. She has normal reflexes. She displays normal reflexes.  Skin: Skin is warm and dry. Capillary refill takes less than 2 seconds.  Psychiatric: She has a normal mood and affect. Her behavior is normal.  Nursing note and vitals reviewed.  ED Treatments / Results   Vitals:  02/01/17 0245 02/01/17 0426  BP: (!) 145/70 122/74  Pulse: 61   Resp: 16 16  Temp:      DIAGNOSTIC STUDIES:  Oxygen Saturation is 100% on RA, normal by my interpretation.    COORDINATION OF CARE:  2:24 AM Will order IV fluids, Zofran, Discussed treatment plan with pt at bedside and pt agreed to plan.   Labs (all labs ordered are listed, but only abnormal results are displayed)  Results for orders placed or performed during the hospital encounter of 02/01/17  Lipase, blood  Result Value Ref Range   Lipase 24 11 - 51 U/L  Comprehensive metabolic panel  Result Value Ref Range   Sodium 132 (L) 135 - 145 mmol/L   Potassium 3.4 (L) 3.5 - 5.1 mmol/L   Chloride 99 (L) 101 - 111 mmol/L   CO2 24 22 - 32 mmol/L   Glucose, Bld 158 (H) 65 - 99 mg/dL   BUN 9 6 - 20 mg/dL   Creatinine, Ser 0.81 0.44 - 1.00 mg/dL   Calcium 9.1 8.9 - 10.3 mg/dL   Total Protein 6.3  (L) 6.5 - 8.1 g/dL   Albumin 3.7 3.5 - 5.0 g/dL   AST 16 15 - 41 U/L   ALT 6 (L) 14 - 54 U/L   Alkaline Phosphatase 58 38 - 126 U/L   Total Bilirubin 0.8 0.3 - 1.2 mg/dL   GFR calc non Af Amer >60 >60 mL/min   GFR calc Af Amer >60 >60 mL/min   Anion gap 9 5 - 15  CBC  Result Value Ref Range   WBC 13.0 (H) 4.0 - 10.5 K/uL   RBC 4.79 3.87 - 5.11 MIL/uL   Hemoglobin 15.3 (H) 12.0 - 15.0 g/dL   HCT 45.1 36.0 - 46.0 %   MCV 94.2 78.0 - 100.0 fL   MCH 31.9 26.0 - 34.0 pg   MCHC 33.9 30.0 - 36.0 g/dL   RDW 12.7 11.5 - 15.5 %   Platelets 221 150 - 400 K/uL  Urinalysis, Routine w reflex microscopic  Result Value Ref Range   Color, Urine YELLOW YELLOW   APPearance CLEAR CLEAR   Specific Gravity, Urine 1.018 1.005 - 1.030   pH 6.0 5.0 - 8.0   Glucose, UA NEGATIVE NEGATIVE mg/dL   Hgb urine dipstick MODERATE (A) NEGATIVE   Bilirubin Urine NEGATIVE NEGATIVE   Ketones, ur 20 (A) NEGATIVE mg/dL   Protein, ur 30 (A) NEGATIVE mg/dL   Nitrite NEGATIVE NEGATIVE   Leukocytes, UA NEGATIVE NEGATIVE   RBC / HPF 6-30 0 - 5 RBC/hpf   WBC, UA 0-5 0 - 5 WBC/hpf   Bacteria, UA NONE SEEN NONE SEEN   Squamous Epithelial / LPF 0-5 (A) NONE SEEN   Mucous PRESENT    Hyaline Casts, UA PRESENT    Ct Abdomen Pelvis W Contrast  Result Date: 02/01/2017 CLINICAL DATA:  Diarrhea, vomiting and abdominal pain predominately in RIGHT lower quadrant. History of diverticulosis, hemorrhoid, hiatal hernia. EXAM: CT ABDOMEN AND PELVIS WITH CONTRAST TECHNIQUE: Multidetector CT imaging of the abdomen and pelvis was performed using the standard protocol following bolus administration of intravenous contrast. CONTRAST:  136mL ISOVUE-300 IOPAMIDOL (ISOVUE-300) INJECTION 61% COMPARISON:  None. FINDINGS: LOWER CHEST: Dependent atelectasis. Included heart size is normal. No pericardial effusion. HEPATOBILIARY: Subcentimeter hypodensity liver likely represent cysts. Normal gallbladder. PANCREAS: Normal. SPLEEN: Normal.  ADRENALS/URINARY TRACT: Kidneys are orthotopic, demonstrating symmetric enhancement. No nephrolithiasis, hydronephrosis or solid renal masses. 18 mm RIGHT interpolar cyst. LEFT extra  renal pelvis. Too small to characterize hypodensity lower pole LEFT kidney. Focal RIGHT upper pole renal scarring. The unopacified ureters are normal in course and caliber. Delayed imaging through the kidneys demonstrates symmetric prompt contrast excretion within the proximal urinary collecting system. Urinary bladder is partially distended and unremarkable. Normal adrenal glands. STOMACH/BOWEL: Multiple loops of small bowel wall thickening and edema with mild mural hyper re- media. Small hiatal hernia. Moderate sigmoid and small bowel diverticulosis. VASCULAR/LYMPHATIC: Aortoiliac vessels are normal in course and caliber, mild calcific atherosclerosis. No lymphadenopathy by CT size criteria. Mild mesenteric edema. REPRODUCTIVE: Status post hysterectomy. OTHER: Small amount of free fluid in the pelvis is likely reactive. No focal fluid collection or intraperitoneal free air. MUSCULOSKELETAL: Nonacute.  Multilevel moderate degenerative discs. IMPRESSION: Enteritis without complication. Mild diverticulosis.  Normal appendix. Electronically Signed   By: Elon Alas M.D.   On: 02/01/2017 03:57     Procedures Procedures (including critical care time)  Medications Ordered in ED  Medications  ondansetron (ZOFRAN-ODT) 4 MG disintegrating tablet (not administered)  ondansetron (ZOFRAN-ODT) disintegrating tablet 4 mg (4 mg Oral Given 01/31/17 2208)  ondansetron (ZOFRAN) injection 4 mg (4 mg Intravenous Given 02/01/17 0308)  sodium chloride 0.9 % bolus 1,000 mL (0 mLs Intravenous Stopped 02/01/17 0425)  fentaNYL (SUBLIMAZE) injection 50 mcg (50 mcg Intravenous Given 02/01/17 0308)  iopamidol (ISOVUE-300) 61 % injection (100 mLs  Contrast Given 02/01/17 0331)  ketorolac (TORADOL) 30 MG/ML injection 15 mg (15 mg Intravenous Given  02/01/17 0427)      Final Clinical Impressions(s) / ED Diagnoses  Viral gastroenteritis: po challenged in the Ed well appearing.  Stable for discharge. Return immediately for fever >101,intractable vomiting,  intractable pain, weakness, bleeding or any concerns. Follow up with your own doctor for ongoing concerns.    The patient is nontoxic-appearing on exam and vital signs are within normal limits.   I have reviewed the triage vital signs and the nursing notes. Pertinent labs &imaging results that were available during my care of the patient were reviewed by me and considered in my medical decision making (see chart for details).  After history, exam, and medical workup I feel the patient has been appropriately medically screened and is safe for discharge home. Pertinent diagnoses were discussed with the patient. Patient was given return precautions.   I personally performed the services described in this documentation, which was scribed in my presence. The recorded information has been reviewed and is accurate.      Vern Prestia, MD 02/01/17 [phone]

## 2017-02-01 NOTE — ED Notes (Signed)
Pt given apple juice. Tolerating well.

## 2017-02-01 NOTE — ED Notes (Signed)
Patient transported to CT 

## 2017-02-04 DIAGNOSIS — J438 Other emphysema: Secondary | ICD-10-CM | POA: Diagnosis not present

## 2017-02-04 DIAGNOSIS — Z87891 Personal history of nicotine dependence: Secondary | ICD-10-CM | POA: Diagnosis not present

## 2017-02-04 DIAGNOSIS — R7989 Other specified abnormal findings of blood chemistry: Secondary | ICD-10-CM | POA: Diagnosis not present

## 2017-02-04 DIAGNOSIS — I10 Essential (primary) hypertension: Secondary | ICD-10-CM | POA: Diagnosis not present

## 2017-02-04 DIAGNOSIS — R7301 Impaired fasting glucose: Secondary | ICD-10-CM | POA: Diagnosis not present

## 2017-02-04 DIAGNOSIS — E782 Mixed hyperlipidemia: Secondary | ICD-10-CM | POA: Diagnosis not present

## 2017-02-25 ENCOUNTER — Ambulatory Visit
Admission: RE | Admit: 2017-02-25 | Discharge: 2017-02-25 | Disposition: A | Discharge status: Home / Self Care | Payer: Medicare Other | Source: Ambulatory Visit | Attending: Family Medicine | Admitting: Family Medicine

## 2017-02-25 DIAGNOSIS — Z1231 Encounter for screening mammogram for malignant neoplasm of breast: Secondary | ICD-10-CM

## 2017-03-06 ENCOUNTER — Other Ambulatory Visit: Payer: Self-pay | Admitting: Physician Assistant

## 2017-03-06 DIAGNOSIS — F17210 Nicotine dependence, cigarettes, uncomplicated: Secondary | ICD-10-CM | POA: Diagnosis not present

## 2017-03-06 DIAGNOSIS — E782 Mixed hyperlipidemia: Secondary | ICD-10-CM | POA: Diagnosis not present

## 2017-03-06 DIAGNOSIS — R938 Abnormal findings on diagnostic imaging of other specified body structures: Secondary | ICD-10-CM | POA: Diagnosis not present

## 2017-03-06 DIAGNOSIS — R87619 Unspecified abnormal cytological findings in specimens from cervix uteri: Secondary | ICD-10-CM | POA: Diagnosis not present

## 2017-03-06 DIAGNOSIS — I10 Essential (primary) hypertension: Secondary | ICD-10-CM | POA: Diagnosis not present

## 2017-03-06 DIAGNOSIS — Z0000 Encounter for general adult medical examination without abnormal findings: Secondary | ICD-10-CM | POA: Diagnosis not present

## 2017-03-06 DIAGNOSIS — R9389 Abnormal findings on diagnostic imaging of other specified body structures: Secondary | ICD-10-CM

## 2017-04-02 ENCOUNTER — Encounter: Payer: Self-pay | Admitting: Pulmonary Disease

## 2017-04-02 ENCOUNTER — Ambulatory Visit (INDEPENDENT_AMBULATORY_CARE_PROVIDER_SITE_OTHER): Payer: Medicare Other | Admitting: Pulmonary Disease

## 2017-04-02 VITALS — BP 132/70 | HR 73 | Ht 62.5 in | Wt 138.0 lb

## 2017-04-02 DIAGNOSIS — R911 Solitary pulmonary nodule: Secondary | ICD-10-CM | POA: Diagnosis not present

## 2017-04-02 DIAGNOSIS — J438 Other emphysema: Secondary | ICD-10-CM | POA: Diagnosis not present

## 2017-04-02 DIAGNOSIS — F17210 Nicotine dependence, cigarettes, uncomplicated: Secondary | ICD-10-CM | POA: Diagnosis not present

## 2017-04-02 DIAGNOSIS — J432 Centrilobular emphysema: Secondary | ICD-10-CM | POA: Diagnosis not present

## 2017-04-02 NOTE — Progress Notes (Signed)
Subjective:    Patient ID: [name], female    DOB: 25-Nov-1940, 76 y.o.   MRN: [id number]  Synopsis: Referred in July 2018 for COPD.  HPI Chief Complaint  Patient presents with  . Advice Only    Referred by Dr. Melford Aase for emphysema.     [name] was referred to me for evaluation of her tobacco abuse and concern for high risk of lung cancer.  She has smoked since she was age 41. She smokes 1ppd and has smoked that for years.  No dyspnea.  She says that she will occasionally produce mucus when she gets a lot os sinus congestion.  She used to live in [place], [place] and moved here 7 years ago.  Her allergies were worse when she lived in [place].  Prior to [place] she lived in [place], [place].    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    She really wants to quit smoking. She has tried Chantix, nicotine patches and gum.  She really struggles with this and she feels very addicted to her cigarettes.  She is trying to work on her triggers.  She has done accupuncture, hypnosis in the past.  She can typically quit for a while, but then she falls back into it again.  Her husband smokes cigarettes.     Past Medical History:  Diagnosis Date  . Blood glucose elevated 05/27/2012  . BP (high blood pressure) 12/03/2011  . Cancer ([place])    skin cancer  . 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 CN (constipation) 12/06/2015  . 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 GERD (gastroesophageal reflux disease)   . Hemorrhoid 12/06/2015  . History of hiatal hernia   . HLD (hyperlipidemia) 12/03/2011  . Hypertension   . Osteopenia 05/30/2015   Overview:   Bone density 05/2015 started fosamax   . Primary localized osteoarthritis of left knee   . Primary localized osteoarthritis of right knee 12/06/2015     No family history on file.   Social History   Social History  . Marital status: Married    Spouse name: N/A  . Number of children: N/A  . Years of education: N/A   Occupational History  . Not on file.   Social History Main Topics  . Smoking status: Current Every Day Smoker    Packs/day: 1.00    Years: 50.00    Types: Cigarettes  . Smokeless tobacco: Never Used     Comment: down to 0.5ppd currently 04/02/17  . Alcohol use Yes     Comment: social rare  . Drug use: No  . Sexual activity: Not on file   Other Topics Concern  . Not on file   Social History Narrative  . No narrative on file     Allergies  Allergen Reactions  . Sulfa Antibiotics Other (See Comments)    UNSPECIFIED, childhood reaction  . Codeine Hives and Rash  . Hydrocodone Other (See Comments)    FATIGUE  . Meperidine Nausea And Vomiting     Outpatient Medications Prior to Visit  Medication Sig Dispense  Refill  . acetaminophen (TYLENOL) 500 MG tablet Take 1,000 mg by mouth 4 (four) times daily as needed for moderate pain.    Marland Kitchen aspirin EC 325 MG EC tablet 1 tab a day for the next 30 days to prevent blood clots 30 tablet 0  . calcium carbonate (OS-CAL) 600 MG TABS tablet Take 600 mg by mouth daily.    Marland Kitchen docusate sodium (COLACE) 100 MG capsule 1 tab 2 times a day while on narcotics.  STOOL SOFTENER (Patient taking differently: daily as needed. 1 tab 2 times a day while on narcotics.  STOOL SOFTENER) 60 capsule 0  . metoprolol succinate (TOPROL-XL) 50 MG 24 hr tablet Take 50 mg by mouth daily. Take with or immediately following a meal.    . ondansetron (ZOFRAN ODT) 8 MG disintegrating tablet 8mg  ODT q8 hours prn nausea 4 tablet 0  . polyethylene glycol (MIRALAX / GLYCOLAX) packet 17grams in 6 oz of water twice a day until bowel movement.  LAXITIVE.  Restart  if two days since last bowel movement 14 each 0  . simvastatin (ZOCOR) 20 MG tablet Take 20 mg by mouth daily.    Marland Kitchen HYDROmorphone (DILAUDID) 2 MG tablet 1-2 tablets every 4-6 hrs as needed for pain (Patient not taking: Reported on 04/02/2017) 60 tablet 0   No facility-administered medications prior to visit.        Review of Systems  Constitutional: Negative for fever and unexpected weight change.  HENT: Positive for congestion. Negative for dental problem, ear pain, nosebleeds, postnasal drip, rhinorrhea, sinus pressure, sneezing, sore throat and trouble swallowing.   Eyes: Negative for redness and itching.  Respiratory: Positive for cough. Negative for chest tightness, shortness of breath and wheezing.   Cardiovascular: Negative for palpitations and leg swelling.  Gastrointestinal: Negative for nausea and vomiting.  Genitourinary: Negative for dysuria.  Musculoskeletal: Negative for joint swelling.  Skin: Negative for rash.  Neurological: Negative for headaches.  Hematological: Does not bruise/bleed easily.  Psychiatric/Behavioral: Negative for dysphoric mood. The patient is not nervous/anxious.        Objective:   Physical Exam  Vitals:   04/02/17 1038  BP: 132/70  Pulse: 73  SpO2: 98%  Weight: 138 lb (62.6 kg)  Height: 5' 2.5" (1.588 m)   Gen: well appearing, no acute distress HENT: NCAT, OP clear, neck supple without masses Eyes: PERRL, EOMi Lymph: no cervical lymphadenopathy PULM: Crackles in bases bilaterally B CV: RRR, no mgr, no JVD GI: BS+, soft, nontender, no hsm Derm: no rash or skin breakdown MSK: normal bulk and tone Neuro: A&Ox4, CN II-XII intact, strength 5/5 in all 4 extremities Psyche: normal mood and affect   Chest imaging: May 2018 CT abdomen lung windows images independently reviewed showing mild centrilobular emphysema in the bases bilaterally but no scarring  02/2016 CT chest FINDINGS: Pulmonary edema and bilateral pleural and parenchymal  scarring. Stable 5 mm groundglass opacity in the left upper lobe (image 47). Stable 8 mm groundglass opacity in the right upper lobe (image 11). Additional scattered areas of groundglass opacity, also likely stable and likely due to chronic change or scarring. Stable 2 mm right upper lobe nodule (image 33). No acute infiltrate, pleural effusion or pneumothorax.  Normal heart size. Normal caliber thoracic aorta. No suspicious mediastinal, hilar or axillary adenopathy. Hiatal hernia. No acute findings in the imaged upper abdomen. No acute bone abnormality or suspicious destructive bone lesions.   IMPRESSION: Pulmonary emphysema. Stable groundglass opacities in both lungs, likely due to  chronic change or scarring. Unchanged 2 mm right upper lobe nodule.  CBC    Component Value Date/Time   WBC 13.0 (H) 01/31/2017 2206   RBC 4.79 01/31/2017 2206   HGB 15.3 (H) 01/31/2017 2206   HCT 45.1 01/31/2017 2206   PLT 221 01/31/2017 2206   MCV 94.2 01/31/2017 2206   MCH 31.9 01/31/2017 2206   MCHC 33.9 01/31/2017 2206   RDW 12.7 01/31/2017 2206   LYMPHSABS 2.4 04/19/2016 1053   MONOABS 0.4 04/19/2016 1053   EOSABS 0.1 04/19/2016 1053   BASOSABS 0.0 04/19/2016 1053        Assessment & Plan:  Other emphysema ([facility]) - Plan: CT Chest Wo Contrast, Pulmonary function test  Centrilobular emphysema (HCC)  Pulmonary nodule  Cigarette smoker   Discussion: Jennene is here to establish care with me for her centrilobular emphysema, pulmonary nodules, and tobacco abuse. Primary problem she has today is ongoing tobacco abuse though based on my review of images from her previous CT scans it does seem that she has groundglass nodules in the bilateral upper lobes. Groundglass nodules typically need to be followed for 5 years total. So rather than enroll her into the lung cancer screening program she dyspnea is to continue getting CT scans for a total of 5 years.  She doesn't have any dyspnea but  she does have centrilobular emphysema so I think we need to get lung function testing to evaluate the severity of that.   We spent a significant amount time today talking about smoking cessation. She has been unable to quit smoking despite using Chantix, nicotine replacement, hypnosis, counseling, and acupuncture in the past. We will give her a list of resources that are available here locally, and if she is unable to quit over the next few months then we can consider Wellbutrin.  Plan: For your tobacco use: Stop smoking right away Review the smoking cessation resource sheet we gave you  For your centrilobular emphysema seen on CT scanning: We will check a lung function test Quit smoking  For your pulmonary nodule: We will get a CT scan of your chest  We will plan on seeing you back in one year but appear lung function test is abnormal we will see sooner than that  Current Outpatient Prescriptions:  .  acetaminophen (TYLENOL) 500 MG tablet, Take 1,000 mg by mouth 4 (four) times daily as needed for moderate pain., Disp: , Rfl:  .  alendronate (FOSAMAX) 70 MG tablet, Take 70 mg by mouth once a week. Take with a full glass of water on an empty stomach., Disp: , Rfl:  .  aspirin EC 325 MG EC tablet, 1 tab a day for the next 30 days to prevent blood clots, Disp: 30 tablet, Rfl: 0 .  calcium carbonate (OS-CAL) 600 MG TABS tablet, Take 600 mg by mouth daily., Disp: , Rfl:  .  docusate sodium (COLACE) 100 MG capsule, 1 tab 2 times a day while on narcotics.  STOOL SOFTENER (Patient taking differently: daily as needed. 1 tab 2 times a day while on narcotics.  STOOL SOFTENER), Disp: 60 capsule, Rfl: 0 .  metoprolol succinate (TOPROL-XL) 50 MG 24 hr tablet, Take 50 mg by mouth daily. Take with or immediately following a meal., Disp: , Rfl:  .  ondansetron (ZOFRAN ODT) 8 MG disintegrating tablet, 8mg  ODT q8 hours prn nausea, Disp: 4 tablet, Rfl: 0 .  polyethylene glycol (MIRALAX / GLYCOLAX) packet,  17grams in 6 oz of water twice a  day until bowel movement.  LAXITIVE.  Restart if two days since last bowel movement, Disp: 14 each, Rfl: 0 .  simvastatin (ZOCOR) 20 MG tablet, Take 20 mg by mouth daily., Disp: , Rfl:

## 2017-04-02 NOTE — Patient Instructions (Signed)
For your tobacco use: Stop smoking right away Review the smoking cessation resource sheet we gave you  For your centrilobular emphysema seen on CT scanning: We will check a lung function test Quit smoking  For your pulmonary nodule: We will get a CT scan of your chest  We will plan on seeing you back in one year but appear lung function test is abnormal we will see sooner than that

## 2017-04-04 ENCOUNTER — Ambulatory Visit (INDEPENDENT_AMBULATORY_CARE_PROVIDER_SITE_OTHER)
Admission: RE | Admit: 2017-04-04 | Discharge: 2017-04-04 | Disposition: A | Discharge status: Home / Self Care | Payer: Medicare Other | Source: Ambulatory Visit | Attending: Pulmonary Disease | Admitting: Pulmonary Disease

## 2017-04-04 DIAGNOSIS — R918 Other nonspecific abnormal finding of lung field: Secondary | ICD-10-CM | POA: Diagnosis not present

## 2017-04-04 DIAGNOSIS — J438 Other emphysema: Secondary | ICD-10-CM | POA: Diagnosis not present

## 2017-04-07 ENCOUNTER — Other Ambulatory Visit: Payer: Self-pay

## 2017-04-07 ENCOUNTER — Ambulatory Visit (INDEPENDENT_AMBULATORY_CARE_PROVIDER_SITE_OTHER): Payer: Medicare Other | Admitting: Pulmonary Disease

## 2017-04-07 DIAGNOSIS — R918 Other nonspecific abnormal finding of lung field: Secondary | ICD-10-CM

## 2017-04-07 DIAGNOSIS — J438 Other emphysema: Secondary | ICD-10-CM | POA: Diagnosis not present

## 2017-04-07 LAB — PULMONARY FUNCTION TEST
DL/VA % pred: 89 %
DL/VA: 4.05 ml/min/mmHg/L
DLCO UNC: 16.87 ml/min/mmHg
DLCO unc % pred: 78 %
FEF 25-75 Post: 0.92 L/sec
FEF 25-75 Pre: 0.99 L/sec
FEF2575-%Change-Post: -7 %
FEF2575-%Pred-Post: 63 %
FEF2575-%Pred-Pre: 69 %
FEV1-%CHANGE-POST: -10 %
FEV1-%Pred-Post: 73 %
FEV1-%Pred-Pre: 82 %
FEV1-POST: 1.36 L
FEV1-Pre: 1.53 L
FEV1FVC-%CHANGE-POST: -12 %
FEV1FVC-%PRED-PRE: 89 %
FEV6-%Change-Post: 2 %
FEV6-%Pred-Post: 100 %
FEV6-%Pred-Pre: 97 %
FEV6-Post: 2.35 L
FEV6-Pre: 2.3 L
FEV6FVC-%Change-Post: 0 %
FEV6FVC-%Pred-Post: 105 %
FEV6FVC-%Pred-Pre: 105 %
FVC-%Change-Post: 2 %
FVC-%PRED-POST: 95 %
FVC-%PRED-PRE: 93 %
FVC-PRE: 2.32 L
FVC-Post: 2.37 L
POST FEV1/FVC RATIO: 58 %
PRE FEV6/FVC RATIO: 99 %
Post FEV6/FVC ratio: 99 %
Pre FEV1/FVC ratio: 66 %

## 2017-04-07 NOTE — Progress Notes (Signed)
PFT done today. 

## 2017-04-08 ENCOUNTER — Ambulatory Visit: Payer: Medicare Other | Admitting: Pulmonary Disease

## 2017-04-24 DIAGNOSIS — L821 Other seborrheic keratosis: Secondary | ICD-10-CM | POA: Diagnosis not present

## 2017-04-24 DIAGNOSIS — L814 Other melanin hyperpigmentation: Secondary | ICD-10-CM | POA: Diagnosis not present

## 2017-04-24 DIAGNOSIS — D692 Other nonthrombocytopenic purpura: Secondary | ICD-10-CM | POA: Diagnosis not present

## 2017-04-24 DIAGNOSIS — D1801 Hemangioma of skin and subcutaneous tissue: Secondary | ICD-10-CM | POA: Diagnosis not present

## 2017-04-24 DIAGNOSIS — D2239 Melanocytic nevi of other parts of face: Secondary | ICD-10-CM | POA: Diagnosis not present

## 2017-04-24 DIAGNOSIS — Z85828 Personal history of other malignant neoplasm of skin: Secondary | ICD-10-CM | POA: Diagnosis not present

## 2017-04-24 DIAGNOSIS — L738 Other specified follicular disorders: Secondary | ICD-10-CM | POA: Diagnosis not present

## 2017-04-24 DIAGNOSIS — L720 Epidermal cyst: Secondary | ICD-10-CM | POA: Diagnosis not present

## 2017-04-24 DIAGNOSIS — D225 Melanocytic nevi of trunk: Secondary | ICD-10-CM | POA: Diagnosis not present

## 2017-04-28 DIAGNOSIS — Z96653 Presence of artificial knee joint, bilateral: Secondary | ICD-10-CM | POA: Diagnosis not present

## 2017-05-28 DIAGNOSIS — Z85828 Personal history of other malignant neoplasm of skin: Secondary | ICD-10-CM | POA: Diagnosis not present

## 2017-05-28 DIAGNOSIS — L720 Epidermal cyst: Secondary | ICD-10-CM | POA: Diagnosis not present

## 2017-06-13 DIAGNOSIS — Z23 Encounter for immunization: Secondary | ICD-10-CM | POA: Diagnosis not present

## 2017-08-05 DIAGNOSIS — L723 Sebaceous cyst: Secondary | ICD-10-CM | POA: Diagnosis not present

## 2017-08-05 DIAGNOSIS — I10 Essential (primary) hypertension: Secondary | ICD-10-CM | POA: Diagnosis not present

## 2017-10-01 DIAGNOSIS — Z85828 Personal history of other malignant neoplasm of skin: Secondary | ICD-10-CM | POA: Diagnosis not present

## 2017-10-01 DIAGNOSIS — L720 Epidermal cyst: Secondary | ICD-10-CM | POA: Diagnosis not present

## 2017-10-23 DIAGNOSIS — L929 Granulomatous disorder of the skin and subcutaneous tissue, unspecified: Secondary | ICD-10-CM | POA: Diagnosis not present

## 2017-10-23 DIAGNOSIS — L928 Other granulomatous disorders of the skin and subcutaneous tissue: Secondary | ICD-10-CM | POA: Diagnosis not present

## 2017-10-23 DIAGNOSIS — L942 Calcinosis cutis: Secondary | ICD-10-CM | POA: Diagnosis not present

## 2018-03-19 ENCOUNTER — Other Ambulatory Visit: Payer: Self-pay | Admitting: Family Medicine

## 2018-03-19 DIAGNOSIS — Z1231 Encounter for screening mammogram for malignant neoplasm of breast: Secondary | ICD-10-CM

## 2018-03-19 DIAGNOSIS — M8580 Other specified disorders of bone density and structure, unspecified site: Secondary | ICD-10-CM

## 2018-04-06 ENCOUNTER — Ambulatory Visit (INDEPENDENT_AMBULATORY_CARE_PROVIDER_SITE_OTHER)
Admission: RE | Admit: 2018-04-06 | Discharge: 2018-04-06 | Disposition: A | Discharge status: Home / Self Care | Payer: Medicare Other | Source: Ambulatory Visit | Attending: Pulmonary Disease | Admitting: Pulmonary Disease

## 2018-04-06 DIAGNOSIS — R918 Other nonspecific abnormal finding of lung field: Secondary | ICD-10-CM | POA: Diagnosis not present

## 2018-04-15 ENCOUNTER — Ambulatory Visit (INDEPENDENT_AMBULATORY_CARE_PROVIDER_SITE_OTHER): Payer: Medicare Other | Admitting: Pulmonary Disease

## 2018-04-15 VITALS — BP 144/80 | HR 72 | Ht 62.5 in | Wt 138.0 lb

## 2018-04-15 DIAGNOSIS — R911 Solitary pulmonary nodule: Secondary | ICD-10-CM | POA: Diagnosis not present

## 2018-04-15 DIAGNOSIS — J432 Centrilobular emphysema: Secondary | ICD-10-CM

## 2018-04-15 NOTE — Patient Instructions (Signed)
COPD: Stop smoking  Cigarette smoking: Stop smoking right away  Pulmonary nodules: The left upper lobe nodule has increased in size, as we discussed today I am concerned this may represent lung cancer.  I am going to check a CT scan called a PET CT and refer you to thoracic surgery for further evaluation.  We will see you back in 6 to 8 weeks or sooner if needed

## 2018-04-15 NOTE — Progress Notes (Signed)
Subjective:    Patient ID: [name], female    DOB: [birth year], 77 y.o.   MRN: [id number]  Synopsis: Referred in July 2018 for COPD.  HPI Chief Complaint  Patient presents with  . Follow-up    review CT chest.    [name] is here to follow-up the results of his CT scan from last week.  Unfortunately this showed that the nodule in the left upper lobe had increased in size.  She says that otherwise she is been feeling fine.  No problems with shortness of breath.  She has a cough from time to time which she attributes to postnasal drip from allergic rhinitis.  No hemoptysis no weight loss.   Past Medical History:  Diagnosis Date  . Blood glucose elevated 05/27/2012  . BP (high blood pressure) 12/03/2011  . Cancer ([place])    skin cancer  . 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 CN (constipation) 12/06/2015  . 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 GERD (gastroesophageal reflux disease)   . Hemorrhoid 12/06/2015  . History of hiatal hernia   . HLD (hyperlipidemia) 12/03/2011  . Hypertension   . Osteopenia 05/30/2015   Overview:  Bone density 05/2015 started fosamax   . Primary localized osteoarthritis of left knee   . Primary localized osteoarthritis of right knee 12/06/2015        Review of Systems  Constitutional: Negative for fever and unexpected weight change.  HENT: Positive for congestion. Negative for dental problem, ear pain, nosebleeds, postnasal drip, rhinorrhea, sinus pressure, sneezing, sore throat and trouble swallowing.   Eyes: Negative for redness and itching.  Respiratory: Positive for cough. Negative for chest tightness, shortness of breath and wheezing.   Cardiovascular: Negative for palpitations and leg swelling.    Gastrointestinal: Negative for nausea and vomiting.  Genitourinary: Negative for dysuria.  Musculoskeletal: Negative for joint swelling.  Skin: Negative for rash.  Neurological: Negative for headaches.  Hematological: Does not bruise/bleed easily.  Psychiatric/Behavioral: Negative for dysphoric mood. The patient is not nervous/anxious.        Objective:   Physical Exam  Vitals:   04/15/18 1052  BP: (!) 144/80  Pulse: 72  SpO2: 100%  Weight: 138 lb (62.6 kg)  Height: 5' 2.5" (1.588 m)    Gen: well appearing HENT: OP clear, TM's clear, neck supple PULM: CTA B, normal percussion CV: RRR, no mgr, trace edema GI: BS+, soft, nontender Derm: no cyanosis or rash Psyche: normal mood and affect   PFT: 03/2017 PFT: Ratio 66%, FEV1 1.53L (82% pred), 3.95L (83% pred)< DLCO 16.50mL (78% pred)  Chest imaging: May 2018 CT abdomen lung windows images independently reviewed showing mild centrilobular emphysema in the bases bilaterally but no scarring  02/2016 CT chest FINDINGS: Pulmonary edema and bilateral pleural and parenchymal scarring. Stable 5 mm groundglass opacity in the left upper lobe (image 47). Stable 8 mm groundglass opacity in the right upper lobe (image 11). Additional scattered areas of groundglass opacity, also likely stable and likely due to chronic change or scarring. Stable 2 mm right upper lobe nodule (image 33). No acute infiltrate, pleural effusion or pneumothorax.  Normal heart size. Normal caliber thoracic aorta. No suspicious mediastinal, hilar or axillary adenopathy. Hiatal hernia. No acute  findings in the imaged upper abdomen. No acute bone abnormality or suspicious destructive bone lesions.   IMPRESSION: Pulmonary emphysema. Stable groundglass opacities in both lungs, likely due to chronic change or scarring. Unchanged 2 mm right upper lobe nodule.  04/06/2018 CT Chest: LUL nodule now 2.1 cm in size  CBC    Component Value Date/Time   WBC 13.0  (H) 01/31/2017 2206   RBC 4.79 01/31/2017 2206   HGB 15.3 (H) 01/31/2017 2206   HCT 45.1 01/31/2017 2206   PLT 221 01/31/2017 2206   MCV 94.2 01/31/2017 2206   MCH 31.9 01/31/2017 2206   MCHC 33.9 01/31/2017 2206   RDW 12.7 01/31/2017 2206   LYMPHSABS 2.4 04/19/2016 1053   MONOABS 0.4 04/19/2016 1053   EOSABS 0.1 04/19/2016 1053   BASOSABS 0.0 04/19/2016 1053        Assessment & Plan:  Centrilobular emphysema (HCC)  Solitary pulmonary nodule - Plan: NM PET Image Initial (PI) Skull Base To Thigh, Ambulatory referral to Cardiothoracic Surgery  Discussion: [name] has a left upper lobe nodule which has doubled in size on this year CT scan compared to last year.  Because it solid, spiculated and increasing in size and an active smoker I am strongly suspicious that this represents bronchogenic carcinoma.  The other groundglass abnormalities have not changed.  I think the best approach is to get a PET CT scan and if none of the other nodules show FDG activity then she probably needs a left upper lobectomy.  Even though she has mild airflow obstruction and centrilobular emphysema she remains asymptomatic from her COPD.  Plan: COPD: Stop smoking  Cigarette smoking: Stop smoking right away  Pulmonary nodules: The left upper lobe nodule has increased in size, as we discussed today I am concerned this may represent lung cancer.  I am going to check a CT scan called a PET CT and refer you to thoracic surgery for further evaluation.  We will see you back in 6 to 8 weeks or sooner if needed    Current Outpatient Medications:  .  aspirin EC 81 MG tablet, Take 81 mg by mouth every other day. , Disp: , Rfl:  .  calcium carbonate (OS-CAL) 600 MG TABS tablet, Take 600 mg by mouth daily., Disp: , Rfl:  .  metoprolol succinate (TOPROL-XL) 50 MG 24 hr tablet, Take 50 mg by mouth daily. Take with or immediately following a meal., Disp: , Rfl:  .  simvastatin (ZOCOR) 20 MG tablet, Take 20 mg by  mouth daily., Disp: , Rfl:

## 2018-04-24 ENCOUNTER — Ambulatory Visit (HOSPITAL_COMMUNITY)
Admission: RE | Admit: 2018-04-24 | Discharge: 2018-04-24 | Disposition: A | Discharge status: Home / Self Care | Payer: Medicare Other | Source: Ambulatory Visit | Attending: Pulmonary Disease | Admitting: Pulmonary Disease

## 2018-04-24 DIAGNOSIS — R918 Other nonspecific abnormal finding of lung field: Secondary | ICD-10-CM | POA: Diagnosis not present

## 2018-04-24 DIAGNOSIS — R911 Solitary pulmonary nodule: Secondary | ICD-10-CM

## 2018-04-24 DIAGNOSIS — J439 Emphysema, unspecified: Secondary | ICD-10-CM | POA: Insufficient documentation

## 2018-04-24 LAB — GLUCOSE, CAPILLARY: GLUCOSE-CAPILLARY: 113 mg/dL — AB (ref 70–99)

## 2018-04-24 MED ORDER — FLUDEOXYGLUCOSE F - 18 (FDG) INJECTION
6.8900 | Freq: Once | INTRAVENOUS | Status: AC | PRN
  Administered 2018-04-24: 6.89 via INTRAVENOUS

## 2018-04-27 ENCOUNTER — Institutional Professional Consult (permissible substitution) (INDEPENDENT_AMBULATORY_CARE_PROVIDER_SITE_OTHER): Payer: Medicare Other | Admitting: Thoracic Surgery (Cardiothoracic Vascular Surgery)

## 2018-04-27 ENCOUNTER — Encounter: Payer: Self-pay | Admitting: Thoracic Surgery (Cardiothoracic Vascular Surgery)

## 2018-04-27 ENCOUNTER — Other Ambulatory Visit: Payer: Self-pay

## 2018-04-27 VITALS — BP 149/88 | HR 80 | Resp 18 | Ht 62.5 in | Wt 139.8 lb

## 2018-04-27 DIAGNOSIS — R911 Solitary pulmonary nodule: Secondary | ICD-10-CM | POA: Diagnosis not present

## 2018-04-27 NOTE — H&P (View-Only) (Signed)
PCP is [name], MD Referring Provider is Juanito Doom, MD  Chief Complaint  Patient presents with  . Lung Lesion    new patient consult, PET 8/16, Chest CT 7/29    HPI: [name] is sent for consultation regarding a left upper lobe lung nodule.  [name] is a 77 year old woman with a history of tobacco abuse (1 pack/day for 60 years), emphysema, hypertension, hyperlipidemia, osteoarthritis status post bilateral knee replacements, gastroesophageal reflux, and a cardiac conduction disorder.  She had a CT of the chest and July 2018.  It showed an 11 x 7 mm groundglass nodule in the right apex.  There was a 9 x 5 mm mixed attenuation nodule in the left upper lobe as well as other scattered tiny nodules.  There was moderate centrilobular emphysema.  In July 2019 she underwent a CT of the chest which showed significant enlargement of the left upper lobe nodule which increased to 13 x 21 mm.  The right apical nodule was unchanged.  There also was some aortic and coronary atherosclerotic disease noted.  A PET/CT showed the left upper lobe nodule was intensely hypermetabolic with an SUV of 35.3.  The SUV in the right apical nodule was 1.1.  There is no evidence of regional or distant metastases.  She feels well.  She does not have any chest pain, pressure, or tightness either at rest or with exertion.  She does not get short of breath with exertion.  She does most of the work around her house.  She can walk up 2 flights of stairs without stopping.  Her appetite is good.  She denies weight loss.  She denies headaches or visual changes.  She denies any new bone or joint pain.  Zubrod Score: At the time of surgery this patient's most appropriate activity status/level should be described as: [x]     0    Normal activity, no symptoms []     1    Restricted in physical strenuous activity but ambulatory, able to do out light work []     2    Ambulatory and capable of self care,  unable to do work activities, up and about >50 % of waking hours                              []     3    Only limited self care, in bed greater than 50% of waking hours []     4    Completely disabled, no self care, confined to bed or chair []     5    Moribund  Past Medical History:  Diagnosis Date  . Blood glucose elevated 05/27/2012  . BP (high blood pressure) 12/03/2011  . Cancer ([place])    skin cancer  . 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 CN (constipation) 12/06/2015  . 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 GERD (gastroesophageal reflux disease)   . Hemorrhoid 12/06/2015  . History of hiatal hernia   . HLD (hyperlipidemia) 12/03/2011  . Hypertension   . Osteopenia 05/30/2015   Overview:  Bone density 05/2015 started fosamax   . Primary localized osteoarthritis of left knee   . Primary  localized osteoarthritis of right knee 12/06/2015    Past Surgical History:  Procedure Laterality Date  . ABDOMINAL HYSTERECTOMY    . CATARACT EXTRACTION, BILATERAL    . COLONOSCOPY    . EYE SURGERY    . KNEE SURGERY  05/01/2016   left uncompartmental   . PARTIAL KNEE ARTHROPLASTY Right 12/18/2015   Procedure: UNICOMPARTMENTAL RIGHT KNEE;  Surgeon: Elsie Saas, MD;  Location: [place];  Service: Orthopedics;  Laterality: Right;  . PARTIAL KNEE ARTHROPLASTY Left 05/01/2016   Procedure: UNICOMPARTMENTAL KNEE;  Surgeon: Elsie Saas, MD;  Location: [place];  Service: Orthopedics;  Laterality: Left;  . TOTAL ABDOMINAL HYSTERECTOMY W/ BILATERAL SALPINGOOPHORECTOMY      History reviewed. No pertinent family history.  Social History Social History   Tobacco Use  . Smoking status: Current Every Day Smoker    Packs/day: 1.00    Years: 50.00    Pack years: 50.00    Types:  Cigarettes  . Smokeless tobacco: Never Used  . Tobacco comment: down to less 0.5ppd currently 04/27/2018  Substance Use Topics  . Alcohol use: Yes    Comment: social rare  . Drug use: No    Current Outpatient Medications  Medication Sig Dispense Refill  . aspirin EC 81 MG tablet Take 81 mg by mouth every other day.     . calcium carbonate (OS-CAL) 600 MG TABS tablet Take 600 mg by mouth daily.    . metoprolol succinate (TOPROL-XL) 50 MG 24 hr tablet Take 50 mg by mouth daily. Take with or immediately following a meal.    . simvastatin (ZOCOR) 20 MG tablet Take 20 mg by mouth daily.     No current facility-administered medications for this visit.     Allergies  Allergen Reactions  . Sulfa Antibiotics Other (See Comments)    UNSPECIFIED, childhood reaction  . Codeine Hives and Rash  . Hydrocodone Other (See Comments)    FATIGUE  . Meperidine Nausea And Vomiting    Review of Systems  Constitutional: Negative for activity change, appetite change, fatigue and unexpected weight change.  HENT: Negative for trouble swallowing and voice change.   Eyes: Negative for visual disturbance.  Respiratory: Negative for cough, chest tightness, shortness of breath and wheezing.   Cardiovascular: Negative for chest pain, palpitations and leg swelling.  Gastrointestinal: Negative for abdominal pain and blood in stool.  Genitourinary: Negative for difficulty urinating and dysuria.  Musculoskeletal: Negative for arthralgias (Status post bilateral TKR).  Neurological: Negative for seizures, syncope and weakness.  Hematological: Negative for adenopathy. Does not bruise/bleed easily.  All other systems reviewed and are negative.   BP (!) 149/88 (BP Location: Right Arm, Patient Position: Sitting, Cuff Size: Normal)   Pulse 80   Resp 18   Ht 5' 2.5" (1.588 m)   Wt 139 lb 12.8 oz (63.4 kg)   SpO2 98% Comment: RA  BMI 25.16 kg/m  Physical Exam  Constitutional: She is oriented to person, place,  and time. She appears well-developed and well-nourished. No distress.  HENT:  Head: Normocephalic and atraumatic.  Mouth/Throat: No oropharyngeal exudate.  Eyes: Conjunctivae and EOM are normal. No scleral icterus.  Neck: Neck supple. No thyromegaly present.  Cardiovascular: Normal rate, regular rhythm, normal heart sounds and intact distal pulses. Exam reveals no gallop and no friction rub.  No murmur heard. Pulmonary/Chest: Effort normal and breath sounds normal. No respiratory distress. She has no wheezes. She has no rales.  Abdominal: Soft. She exhibits no distension. There is no  tenderness.  Musculoskeletal: She exhibits no edema.  Lymphadenopathy:    She has no cervical adenopathy.  Neurological: She is alert and oriented to person, place, and time. No cranial nerve deficit. She exhibits normal muscle tone. Coordination normal.  Skin: Skin is warm and dry.  Vitals reviewed.    Diagnostic Tests: CT CHEST WITHOUT CONTRAST  TECHNIQUE: Multidetector CT imaging of the chest was performed following the standard protocol without IV contrast.  COMPARISON:  04/04/2017  FINDINGS: Cardiovascular: Normal heart size. No pericardial effusion. Aortic atherosclerosis noted. Calcification in the LAD and RCA coronary arteries noted.  Mediastinum/Nodes: Normal appearance of the thyroid gland. The trachea appears patent and is midline. Normal appearance of the esophagus. No enlarged mediastinal or hilar lymph nodes.  Lungs/Pleura: Moderate changes of emphysema. Significant enlargement of spiculated, solid nodule within the central left upper lobe which now measures 2.1 cm, image 55/3. Previously this measured 0.9 cm. Ground-glass lesion within the anterior right apex is stable measuring 1 cm, image 19/3.  Upper Abdomen: Small low-density structure within left lobe of liver is unchanged measuring 6 mm, image 122/2. 5 mm low-density structure within the anterior right hepatic lobe  is also unchanged. No acute abnormality identified.  Musculoskeletal: No chest wall mass or suspicious bone lesions identified.  IMPRESSION: 1. Significant increase in size of solid nodule within the central left upper lobe now measuring 2.1 cm. Suspicious for primary bronchogenic carcinoma. Further evaluation with PET-CT and tissue sampling advise. 2. Stable sub solid lesion within the right upper lobe. 3. Aortic Atherosclerosis (ICD10-I70.0) and Emphysema (ICD10-J43.9). 4. LAD and RCA coronary artery atherosclerotic calcifications.   Electronically Signed   By: Kerby Moors M.D.   On: 04/06/2018 13:50 NUCLEAR MEDICINE PET SKULL BASE TO THIGH  TECHNIQUE: 6.9 mCi F-18 FDG was injected intravenously. Full-ring PET imaging was performed from the skull base to thigh after the radiotracer. CT data was obtained and used for attenuation correction and anatomic localization.  Fasting blood glucose: 113 mg/dl  COMPARISON:  04/06/2018  FINDINGS: (Reference/background mediastinal blood pool activity: SUV max = 2.4)  NECK:  No hypermetabolic lymph nodes or masses.  Incidental CT findings:  None.  CHEST: 2.2 cm spiculated nodule in central left upper lobe shows intense hypermetabolic activity with SUV max of 14.7. 8 mm pulmonary nodule in the right lung apex on image 11/8 shows mild FDG uptake with SUV max of 1.1. No new or enlarging pulmonary nodules or masses identified.  Incidental CT findings:  Mild emphysema.  ABDOMEN/PELVIS: No abnormal hypermetabolic activity within the liver, pancreas, adrenal glands, or spleen. No hypermetabolic lymph nodes in the abdomen or pelvis.  Incidental CT findings: Small hiatal hernia. Prior hysterectomy noted. Adnexal regions are unremarkable in appearance. Diverticulosis mainly involves the sigmoid colon, without evidence of diverticulitis aortic atherosclerosis.  SKELETON: No focal hypermetabolic bone lesions to  suggest skeletal metastasis.  Incidental CT findings:  None.  IMPRESSION: Hypermetabolic 2.2 cm central left upper lobe spiculated nodule, consistent with primary bronchogenic carcinoma.  8 mm nodule in the right lung apex shows minimal FDG activity, however low-grade bronchogenic carcinoma cannot be excluded due to the small size of this nodule. Recommend continued follow-up by chest CT in 6 months.  No evidence of thoracic lymph node metastases or distant metastatic disease.  Mild emphysema.   Electronically Signed   By: Earle Gell M.D.   On: 04/24/2018 16:07 I personally reviewed the CT and PET/CT images and concur with the findings noted above  Pulmonary function testing  04/07/2017 FVC 2.32 (93%) FEV1 1.53 (82%), no improvement with bronchodilator DLCO 16.87 (78%)  Impression: [name] is a 77 year old woman with a history of tobacco abuse mild emphysema who was found to have multiple lung nodules a year ago on a CT of the chest.  A CT in July showed a marked increase in size of a mixed density nodule centrally in the left upper lobe.  This increased from 5 x 9 mm to 13 x 21 mm.  It is markedly hypermetabolic on PET/CT with an SUV of 14.7.  There is no adenopathy or evidence of distant metastases.  This almost certainly represents a new primary bronchogenic carcinoma has to be considered that unless it can be proven otherwise.  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  Infectious and inflammatory nodules are also in the differential but are far less likely.  I discussed the potential diagnostic and treatment algorithms with [name] and her family.  We discussed the possibility of attempting bronchoscopic biopsy.  They understand the potential for a false negative biopsy.  We also discussed potential for surgical resection for definitive diagnosis and treatment at the same setting.  We did discuss stereotactic radiation as an alternative treatment should  she not wish to pursue surgery.  I recommended that we proceed with left VATS for left upper lobectomy.  I discussed the general nature of the procedure, the need for general anesthesia, the incisions to be used and the use of a drainage tube with [name], Mr Raupp and their son. We discussed the expected hospital stay, overall recovery and short and long term outcomes.  I informed him of the indications, risks, benefits, and alternatives.  They understand the risks include, but are not limited to death, stroke, MI, DVT/PE, bleeding, possible need for transfusion, infections, prolonged air leaks, cardiac arrhythmias, as well as other organ system dysfunction including respiratory, renal, or GI complications.   She accepts the risks and agrees to proceed.  Tobacco abuse-she has had difficulty quitting in the past.  She seems committed to quitting in smoked very little for a week, however over the weekend she smoked a lot because of concern about her PET results.  Importance of cessation was emphasized  Coronary atherosclerosis/aortic atherosclerosis-noted on PET CT and CT.  Asymptomatic.  Plan: Left VATS for left upper lobectomy on 05/13/2018  Melrose Nakayama, MD [facility] [phone]

## 2018-04-27 NOTE — Progress Notes (Signed)
PCP is [name], MD Referring Provider is Juanito Doom, MD  Chief Complaint  Patient presents with  . Lung Lesion    new patient consult, PET 8/16, Chest CT 7/29    HPI: [name] is sent for consultation regarding a left upper lobe lung nodule.  [name] is a 77 year old woman with a history of tobacco abuse (1 pack/day for 60 years), emphysema, hypertension, hyperlipidemia, osteoarthritis status post bilateral knee replacements, gastroesophageal reflux, and a cardiac conduction disorder.  She had a CT of the chest and July 2018.  It showed an 11 x 7 mm groundglass nodule in the right apex.  There was a 9 x 5 mm mixed attenuation nodule in the left upper lobe as well as other scattered tiny nodules.  There was moderate centrilobular emphysema.  In July 2019 she underwent a CT of the chest which showed significant enlargement of the left upper lobe nodule which increased to 13 x 21 mm.  The right apical nodule was unchanged.  There also was some aortic and coronary atherosclerotic disease noted.  A PET/CT showed the left upper lobe nodule was intensely hypermetabolic with an SUV of 81.0.  The SUV in the right apical nodule was 1.1.  There is no evidence of regional or distant metastases.  She feels well.  She does not have any chest pain, pressure, or tightness either at rest or with exertion.  She does not get short of breath with exertion.  She does most of the work around her house.  She can walk up 2 flights of stairs without stopping.  Her appetite is good.  She denies weight loss.  She denies headaches or visual changes.  She denies any new bone or joint pain.  Zubrod Score: At the time of surgery this patient's most appropriate activity status/level should be described as: [x]     0    Normal activity, no symptoms []     1    Restricted in physical strenuous activity but ambulatory, able to do out light work []     2    Ambulatory and capable of self care,  unable to do work activities, up and about >50 % of waking hours                              []     3    Only limited self care, in bed greater than 50% of waking hours []     4    Completely disabled, no self care, confined to bed or chair []     5    Moribund  Past Medical History:  Diagnosis Date  . Blood glucose elevated 05/27/2012  . BP (high blood pressure) 12/03/2011  . Cancer ([place])    skin cancer  . 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 CN (constipation) 12/06/2015  . 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 GERD (gastroesophageal reflux disease)   . Hemorrhoid 12/06/2015  . History of hiatal hernia   . HLD (hyperlipidemia) 12/03/2011  . Hypertension   . Osteopenia 05/30/2015   Overview:  Bone density 05/2015 started fosamax   . Primary localized osteoarthritis of left knee   . Primary  localized osteoarthritis of right knee 12/06/2015    Past Surgical History:  Procedure Laterality Date  . ABDOMINAL HYSTERECTOMY    . CATARACT EXTRACTION, BILATERAL    . COLONOSCOPY    . EYE SURGERY    . KNEE SURGERY  05/01/2016   left uncompartmental   . PARTIAL KNEE ARTHROPLASTY Right 12/18/2015   Procedure: UNICOMPARTMENTAL RIGHT KNEE;  Surgeon: Elsie Saas, MD;  Location: [facility];  Service: Orthopedics;  Laterality: Right;  . PARTIAL KNEE ARTHROPLASTY Left 05/01/2016   Procedure: UNICOMPARTMENTAL KNEE;  Surgeon: Elsie Saas, MD;  Location: [facility];  Service: Orthopedics;  Laterality: Left;  . TOTAL ABDOMINAL HYSTERECTOMY W/ BILATERAL SALPINGOOPHORECTOMY      History reviewed. No pertinent family history.  Social History Social History   Tobacco Use  . Smoking status: Current Every Day Smoker    Packs/day: 1.00    Years: 50.00    Pack years: 50.00    Types:  Cigarettes  . Smokeless tobacco: Never Used  . Tobacco comment: down to less 0.5ppd currently 04/27/2018  Substance Use Topics  . Alcohol use: Yes    Comment: social rare  . Drug use: No    Current Outpatient Medications  Medication Sig Dispense Refill  . aspirin EC 81 MG tablet Take 81 mg by mouth every other day.     . calcium carbonate (OS-CAL) 600 MG TABS tablet Take 600 mg by mouth daily.    . metoprolol succinate (TOPROL-XL) 50 MG 24 hr tablet Take 50 mg by mouth daily. Take with or immediately following a meal.    . simvastatin (ZOCOR) 20 MG tablet Take 20 mg by mouth daily.     No current facility-administered medications for this visit.     Allergies  Allergen Reactions  . Sulfa Antibiotics Other (See Comments)    UNSPECIFIED, childhood reaction  . Codeine Hives and Rash  . Hydrocodone Other (See Comments)    FATIGUE  . Meperidine Nausea And Vomiting    Review of Systems  Constitutional: Negative for activity change, appetite change, fatigue and unexpected weight change.  HENT: Negative for trouble swallowing and voice change.   Eyes: Negative for visual disturbance.  Respiratory: Negative for cough, chest tightness, shortness of breath and wheezing.   Cardiovascular: Negative for chest pain, palpitations and leg swelling.  Gastrointestinal: Negative for abdominal pain and blood in stool.  Genitourinary: Negative for difficulty urinating and dysuria.  Musculoskeletal: Negative for arthralgias (Status post bilateral TKR).  Neurological: Negative for seizures, syncope and weakness.  Hematological: Negative for adenopathy. Does not bruise/bleed easily.  All other systems reviewed and are negative.   BP (!) 149/88 (BP Location: Right Arm, Patient Position: Sitting, Cuff Size: Normal)   Pulse 80   Resp 18   Ht 5' 2.5" (1.588 m)   Wt 139 lb 12.8 oz (63.4 kg)   SpO2 98% Comment: RA  BMI 25.16 kg/m  Physical Exam  Constitutional: She is oriented to person, place,  and time. She appears well-developed and well-nourished. No distress.  HENT:  Head: Normocephalic and atraumatic.  Mouth/Throat: No oropharyngeal exudate.  Eyes: Conjunctivae and EOM are normal. No scleral icterus.  Neck: Neck supple. No thyromegaly present.  Cardiovascular: Normal rate, regular rhythm, normal heart sounds and intact distal pulses. Exam reveals no gallop and no friction rub.  No murmur heard. Pulmonary/Chest: Effort normal and breath sounds normal. No respiratory distress. She has no wheezes. She has no rales.  Abdominal: Soft. She exhibits no distension. There is no  tenderness.  Musculoskeletal: She exhibits no edema.  Lymphadenopathy:    She has no cervical adenopathy.  Neurological: She is alert and oriented to person, place, and time. No cranial nerve deficit. She exhibits normal muscle tone. Coordination normal.  Skin: Skin is warm and dry.  Vitals reviewed.    Diagnostic Tests: CT CHEST WITHOUT CONTRAST  TECHNIQUE: Multidetector CT imaging of the chest was performed following the standard protocol without IV contrast.  COMPARISON:  04/04/2017  FINDINGS: Cardiovascular: Normal heart size. No pericardial effusion. Aortic atherosclerosis noted. Calcification in the LAD and RCA coronary arteries noted.  Mediastinum/Nodes: Normal appearance of the thyroid gland. The trachea appears patent and is midline. Normal appearance of the esophagus. No enlarged mediastinal or hilar lymph nodes.  Lungs/Pleura: Moderate changes of emphysema. Significant enlargement of spiculated, solid nodule within the central left upper lobe which now measures 2.1 cm, image 55/3. Previously this measured 0.9 cm. Ground-glass lesion within the anterior right apex is stable measuring 1 cm, image 19/3.  Upper Abdomen: Small low-density structure within left lobe of liver is unchanged measuring 6 mm, image 122/2. 5 mm low-density structure within the anterior right hepatic lobe  is also unchanged. No acute abnormality identified.  Musculoskeletal: No chest wall mass or suspicious bone lesions identified.  IMPRESSION: 1. Significant increase in size of solid nodule within the central left upper lobe now measuring 2.1 cm. Suspicious for primary bronchogenic carcinoma. Further evaluation with PET-CT and tissue sampling advise. 2. Stable sub solid lesion within the right upper lobe. 3. Aortic Atherosclerosis (ICD10-I70.0) and Emphysema (ICD10-J43.9). 4. LAD and RCA coronary artery atherosclerotic calcifications.   Electronically Signed   By: Kerby Moors M.D.   On: 04/06/2018 13:50 NUCLEAR MEDICINE PET SKULL BASE TO THIGH  TECHNIQUE: 6.9 mCi F-18 FDG was injected intravenously. Full-ring PET imaging was performed from the skull base to thigh after the radiotracer. CT data was obtained and used for attenuation correction and anatomic localization.  Fasting blood glucose: 113 mg/dl  COMPARISON:  04/06/2018  FINDINGS: (Reference/background mediastinal blood pool activity: SUV max = 2.4)  NECK:  No hypermetabolic lymph nodes or masses.  Incidental CT findings:  None.  CHEST: 2.2 cm spiculated nodule in central left upper lobe shows intense hypermetabolic activity with SUV max of 14.7. 8 mm pulmonary nodule in the right lung apex on image 11/8 shows mild FDG uptake with SUV max of 1.1. No new or enlarging pulmonary nodules or masses identified.  Incidental CT findings:  Mild emphysema.  ABDOMEN/PELVIS: No abnormal hypermetabolic activity within the liver, pancreas, adrenal glands, or spleen. No hypermetabolic lymph nodes in the abdomen or pelvis.  Incidental CT findings: Small hiatal hernia. Prior hysterectomy noted. Adnexal regions are unremarkable in appearance. Diverticulosis mainly involves the sigmoid colon, without evidence of diverticulitis aortic atherosclerosis.  SKELETON: No focal hypermetabolic bone lesions to  suggest skeletal metastasis.  Incidental CT findings:  None.  IMPRESSION: Hypermetabolic 2.2 cm central left upper lobe spiculated nodule, consistent with primary bronchogenic carcinoma.  8 mm nodule in the right lung apex shows minimal FDG activity, however low-grade bronchogenic carcinoma cannot be excluded due to the small size of this nodule. Recommend continued follow-up by chest CT in 6 months.  No evidence of thoracic lymph node metastases or distant metastatic disease.  Mild emphysema.   Electronically Signed   By: Earle Gell M.D.   On: 04/24/2018 16:07 I personally reviewed the CT and PET/CT images and concur with the findings noted above  Pulmonary function testing  04/07/2017 FVC 2.32 (93%) FEV1 1.53 (82%), no improvement with bronchodilator DLCO 16.87 (78%)  Impression: [name] is a 77 year old woman with a history of tobacco abuse mild emphysema who was found to have multiple lung nodules a year ago on a CT of the chest.  A CT in July showed a marked increase in size of a mixed density nodule centrally in the left upper lobe.  This increased from 5 x 9 mm to 13 x 21 mm.  It is markedly hypermetabolic on PET/CT with an SUV of 14.7.  There is no adenopathy or evidence of distant metastases.  This almost certainly represents a new primary bronchogenic carcinoma has to be considered that unless it can be proven otherwise.  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  Infectious and inflammatory nodules are also in the differential but are far less likely.  I discussed the potential diagnostic and treatment algorithms with Mrs. Segers and her family.  We discussed the possibility of attempting bronchoscopic biopsy.  They understand the potential for a false negative biopsy.  We also discussed potential for surgical resection for definitive diagnosis and treatment at the same setting.  We did discuss stereotactic radiation as an alternative treatment should  she not wish to pursue surgery.  I recommended that we proceed with left VATS for left upper lobectomy.  I discussed the general nature of the procedure, the need for general anesthesia, the incisions to be used and the use of a drainage tube with Mrs Gritz, Mr Kronberg and their son. We discussed the expected hospital stay, overall recovery and short and long term outcomes.  I informed him of the indications, risks, benefits, and alternatives.  They understand the risks include, but are not limited to death, stroke, MI, DVT/PE, bleeding, possible need for transfusion, infections, prolonged air leaks, cardiac arrhythmias, as well as other organ system dysfunction including respiratory, renal, or GI complications.   She accepts the risks and agrees to proceed.  Tobacco abuse-she has had difficulty quitting in the past.  She seems committed to quitting in smoked very little for a week, however over the weekend she smoked a lot because of concern about her PET results.  Importance of cessation was emphasized  Coronary atherosclerosis/aortic atherosclerosis-noted on PET CT and CT.  Asymptomatic.  Plan: Left VATS for left upper lobectomy on 05/13/2018  Melrose Nakayama, MD [facility] [phone]

## 2018-04-28 ENCOUNTER — Other Ambulatory Visit: Payer: Self-pay | Admitting: *Deleted

## 2018-04-28 ENCOUNTER — Encounter: Payer: Self-pay | Admitting: *Deleted

## 2018-04-28 DIAGNOSIS — R911 Solitary pulmonary nodule: Secondary | ICD-10-CM

## 2018-05-04 ENCOUNTER — Ambulatory Visit
Admission: RE | Admit: 2018-05-04 | Discharge: 2018-05-04 | Disposition: A | Discharge status: Home / Self Care | Payer: Medicare Other | Source: Ambulatory Visit | Attending: Family Medicine | Admitting: Family Medicine

## 2018-05-04 DIAGNOSIS — Z780 Asymptomatic menopausal state: Secondary | ICD-10-CM | POA: Diagnosis not present

## 2018-05-04 DIAGNOSIS — M8589 Other specified disorders of bone density and structure, multiple sites: Secondary | ICD-10-CM | POA: Diagnosis not present

## 2018-05-04 DIAGNOSIS — M8580 Other specified disorders of bone density and structure, unspecified site: Secondary | ICD-10-CM

## 2018-05-04 DIAGNOSIS — Z1231 Encounter for screening mammogram for malignant neoplasm of breast: Secondary | ICD-10-CM | POA: Diagnosis not present

## 2018-05-05 ENCOUNTER — Ambulatory Visit: Payer: Medicare Other | Admitting: Pulmonary Disease

## 2018-05-07 NOTE — Pre-Procedure Instructions (Signed)
[name]  05/07/2018      [id number] [name], [place] [phone] [name] [place] [address] Phone: [phone] Fax: [phone]    Your procedure is scheduled on Wednesday September 4th.  Report to [facility] Admitting at [place].M.  Call this number if you have problems the morning of surgery:  [phone]   Remember:  Do not eat or drink after midnight.     Take these medicines the morning of surgery with A SIP OF WATER   Metoprolol, Simvastatin    7 days prior to surgery STOP taking any Aspirin(unless otherwise instructed by your surgeon), Aleve, Naproxen, Ibuprofen, Motrin, Advil, Goody's, BC's, all herbal medications, fish oil, and all vitamins    [name]- Preparing For Surgery  Before surgery, you can play an important role. Because skin is not sterile, your skin needs to be as free of germs as possible. You can reduce the number of germs on your skin by washing with CHG (chlorahexidine gluconate) Soap before surgery.  CHG is an antiseptic cleaner which kills germs and bonds with the skin to continue killing germs even after washing.    Oral Hygiene is also important to reduce your risk of infection.  Remember - BRUSH YOUR TEETH THE MORNING OF SURGERY WITH YOUR REGULAR TOOTHPASTE  Please do not use if you have an allergy to CHG or antibacterial soaps. If your skin becomes reddened/irritated stop using the CHG.  Do not shave (including legs and underarms) for at least 48 hours prior to first CHG shower. It is OK to shave your face.  Please follow these instructions carefully.   1. Shower the NIGHT BEFORE SURGERY and the MORNING OF SURGERY with CHG.   2. If you chose to wash your hair, wash your hair first as usual with your normal shampoo.  3. After you shampoo, rinse your hair and body thoroughly to remove the shampoo.  4. Use CHG as you would any other liquid soap. You can  apply CHG directly to the skin and wash gently with a scrungie or a clean washcloth.   5. Apply the CHG Soap to your body ONLY FROM THE NECK DOWN.  Do not use on open wounds or open sores. Avoid contact with your eyes, ears, mouth and genitals (private parts). Wash Face and genitals (private parts)  with your normal soap.  6. Wash thoroughly, paying special attention to the area where your surgery will be performed.  7. Thoroughly rinse your body with warm water from the neck down.  8. DO NOT shower/wash with your normal soap after using and rinsing off the CHG Soap.  9. Pat yourself dry with a CLEAN TOWEL.  10. Wear CLEAN PAJAMAS to bed the night before surgery, wear comfortable clothes the morning of surgery  11. Place CLEAN SHEETS on your bed the night of your first shower and DO NOT SLEEP WITH PETS.    Day of Surgery:  Do not apply any deodorants/lotions.  Please wear clean clothes to the hospital/surgery center.   Remember to brush your teeth WITH YOUR REGULAR TOOTHPASTE.   Do not wear jewelry, make-up or nail polish.  Do not wear lotions, powders, or perfumes, or deodorant.  Do not shave 48 hours prior to surgery.  Men may shave face and neck.  Do not bring valuables to the hospital.  [name] is not responsible for any belongings or valuables.  Contacts, dentures or bridgework may not be worn into surgery.  Leave your suitcase in the car.  After surgery it may be brought to your room.  For patients admitted to the hospital, discharge time will be determined by your treatment team.  Patients discharged the day of surgery will not be allowed to drive home.    Please read over the following fact sheets that you were given. Coughing and Deep Breathing, Blood Transfusion Information and Surgical Site Infection Prevention

## 2018-05-08 ENCOUNTER — Encounter (HOSPITAL_COMMUNITY)
Admission: RE | Admit: 2018-05-08 | Discharge: 2018-05-08 | Disposition: A | Discharge status: Home / Self Care | Payer: Medicare Other | Source: Ambulatory Visit | Attending: Thoracic Surgery (Cardiothoracic Vascular Surgery) | Admitting: Thoracic Surgery (Cardiothoracic Vascular Surgery)

## 2018-05-08 ENCOUNTER — Encounter (HOSPITAL_COMMUNITY): Payer: Self-pay

## 2018-05-08 ENCOUNTER — Other Ambulatory Visit: Payer: Self-pay

## 2018-05-08 DIAGNOSIS — Z01818 Encounter for other preprocedural examination: Secondary | ICD-10-CM | POA: Insufficient documentation

## 2018-05-08 DIAGNOSIS — R911 Solitary pulmonary nodule: Secondary | ICD-10-CM | POA: Diagnosis not present

## 2018-05-08 DIAGNOSIS — R001 Bradycardia, unspecified: Secondary | ICD-10-CM | POA: Insufficient documentation

## 2018-05-08 HISTORY — DX: Anxiety disorder, unspecified: F41.9

## 2018-05-08 LAB — TYPE AND SCREEN
ABO/RH(D): A POS
Antibody Screen: NEGATIVE

## 2018-05-08 LAB — URINALYSIS, ROUTINE W REFLEX MICROSCOPIC
BACTERIA UA: NONE SEEN
Bilirubin Urine: NEGATIVE
GLUCOSE, UA: NEGATIVE mg/dL
Ketones, ur: NEGATIVE mg/dL
LEUKOCYTES UA: NEGATIVE
NITRITE: NEGATIVE
PH: 7 (ref 5.0–8.0)
Protein, ur: NEGATIVE mg/dL
Specific Gravity, Urine: 1.003 — ABNORMAL LOW (ref 1.005–1.030)

## 2018-05-08 LAB — COMPREHENSIVE METABOLIC PANEL
ALT: 12 U/L (ref 0–44)
AST: 16 U/L (ref 15–41)
Albumin: 3.8 g/dL (ref 3.5–5.0)
Alkaline Phosphatase: 56 U/L (ref 38–126)
Anion gap: 8 (ref 5–15)
BILIRUBIN TOTAL: 0.7 mg/dL (ref 0.3–1.2)
BUN: 12 mg/dL (ref 8–23)
CO2: 25 mmol/L (ref 22–32)
CREATININE: 0.87 mg/dL (ref 0.44–1.00)
Calcium: 9.4 mg/dL (ref 8.9–10.3)
Chloride: 104 mmol/L (ref 98–111)
GFR calc Af Amer: >60 mL/min (ref >60)
Glucose, Bld: 103 mg/dL — ABNORMAL HIGH (ref 70–99)
POTASSIUM: 3.8 mmol/L (ref 3.5–5.1)
Sodium: 137 mmol/L (ref 135–145)
TOTAL PROTEIN: 6.3 g/dL — AB (ref 6.5–8.1)

## 2018-05-08 LAB — APTT: aPTT: 26 seconds (ref 24–36)

## 2018-05-08 LAB — BLOOD GAS, ARTERIAL
ACID-BASE DEFICIT: 1 mmol/L (ref 0.0–2.0)
Bicarbonate: 22.4 mmol/L (ref 20.0–28.0)
DRAWN BY: 470591
FIO2: 21
O2 Saturation: 99.3 %
PCO2 ART: 32.2 mmHg (ref 32.0–48.0)
PH ART: 7.457 — AB (ref 7.350–7.450)
Patient temperature: 98.6
pO2, Arterial: 162 mmHg — ABNORMAL HIGH (ref 83.0–108.0)

## 2018-05-08 LAB — CBC
HEMATOCRIT: 43 % (ref 36.0–46.0)
Hemoglobin: 14 g/dL (ref 12.0–15.0)
MCH: 32.5 pg (ref 26.0–34.0)
MCHC: 32.6 g/dL (ref 30.0–36.0)
MCV: 99.8 fL (ref 78.0–100.0)
Platelets: 200 10*3/uL (ref 150–400)
RBC: 4.31 MIL/uL (ref 3.87–5.11)
RDW: 11.9 % (ref 11.5–15.5)
WBC: 9.9 10*3/uL (ref 4.0–10.5)

## 2018-05-08 LAB — SURGICAL PCR SCREEN
MRSA, PCR: NEGATIVE
Staphylococcus aureus: NEGATIVE

## 2018-05-08 LAB — PROTIME-INR
INR: 0.96
PROTHROMBIN TIME: 12.7 s (ref 11.4–15.2)

## 2018-05-08 NOTE — Pre-Procedure Instructions (Signed)
[name]  05/08/2018      [id number] [name], [place] [phone] [facility] [address] [place] [address] Phone: [phone] Fax: [phone]    Your procedure is scheduled on Wednesday September 4th.  Report to [facility] Admitting at [place].M.  Call this number if you have problems the morning of surgery:  [phone]   Remember:  Do not eat or drink after midnight.     Take these medicines the morning of surgery with A SIP OF WATER   Metoprolol, Simvastatin    7 days prior to surgery STOP taking any Aspirin(unless otherwise instructed by your surgeon), Aleve, Naproxen, Ibuprofen, Motrin, Advil, Goody's, BC's, all herbal medications, fish oil, and all vitamins    [facility]- Preparing For Surgery  Before surgery, you can play an important role. Because skin is not sterile, your skin needs to be as free of germs as possible. You can reduce the number of germs on your skin by washing with CHG (chlorahexidine gluconate) Soap before surgery.  CHG is an antiseptic cleaner which kills germs and bonds with the skin to continue killing germs even after washing.    Oral Hygiene is also important to reduce your risk of infection.  Remember - BRUSH YOUR TEETH THE MORNING OF SURGERY WITH YOUR REGULAR TOOTHPASTE  Please do not use if you have an allergy to CHG or antibacterial soaps. If your skin becomes reddened/irritated stop using the CHG.  Do not shave (including legs and underarms) for at least 48 hours prior to first CHG shower. It is OK to shave your face.  Please follow these instructions carefully.   1. Shower the NIGHT BEFORE SURGERY and the MORNING OF SURGERY with CHG.   2. If you chose to wash your hair, wash your hair first as usual with your normal shampoo.  3. After you shampoo, rinse your hair and body thoroughly to remove the shampoo.  4. Use CHG as you would any other liquid soap. You can  apply CHG directly to the skin and wash gently with a scrungie or a clean washcloth.   5. Apply the CHG Soap to your body ONLY FROM THE NECK DOWN.  Do not use on open wounds or open sores. Avoid contact with your eyes, ears, mouth and genitals (private parts). Wash Face and genitals (private parts)  with your normal soap.  6. Wash thoroughly, paying special attention to the area where your surgery will be performed.  7. Thoroughly rinse your body with warm water from the neck down.  8. DO NOT shower/wash with your normal soap after using and rinsing off the CHG Soap.  9. Pat yourself dry with a CLEAN TOWEL.  10. Wear CLEAN PAJAMAS to bed the night before surgery, wear comfortable clothes the morning of surgery  11. Place CLEAN SHEETS on your bed the night of your first shower and DO NOT SLEEP WITH PETS.    Day of Surgery:  Do not apply any deodorants/lotions.  Please wear clean clothes to the hospital/surgery center.   Remember to brush your teeth WITH YOUR REGULAR TOOTHPASTE.   Do not wear jewelry, make-up or nail polish.  Do not wear lotions, powders, or perfumes, or deodorant.  Do not shave 48 hours prior to surgery.  Men may shave face and neck.  Do not bring valuables to the hospital.  [facility] is not responsible for any belongings or valuables.  Contacts, dentures or bridgework may not be worn into surgery.  Leave your suitcase in the car.  After surgery it may be brought to your room.  For patients admitted to the hospital, discharge time will be determined by your treatment team.  Patients discharged the day of surgery will not be allowed to drive home.    Please read over the following fact sheets that you were given. Coughing and Deep Breathing, Blood Transfusion Information and Surgical Site Infection Prevention

## 2018-05-08 NOTE — Progress Notes (Addendum)
PCP  Anastasia Pall  MD  Pt. Had Stress test years ago before knee replacement,states it was normal. Does not remember name of cardiologist but was told no need to follow-up.

## 2018-05-12 NOTE — Anesthesia Preprocedure Evaluation (Addendum)
Anesthesia Evaluation  Patient identified by MRN, date of birth, ID band Patient awake    Reviewed: Allergy & Precautions, H&P , NPO status , Patient's Chart, lab work & pertinent test results, reviewed documented beta blocker date and time   Airway Mallampati: II  TM Distance: <3 FB Neck ROM: full    Dental  (+) Dental Advidsory Given, Edentulous Upper, Edentulous Lower, Upper Dentures, Lower Dentures   Pulmonary Current Smoker,    breath sounds clear to auscultation       Cardiovascular Exercise Tolerance: Good hypertension, Pt. on medications and Pt. on home beta blockers  Rhythm:Regular Rate:Normal     Neuro/Psych Anxiety negative neurological ROS     GI/Hepatic Neg liver ROS, GERD  Controlled,  Endo/Other  negative endocrine ROS  Renal/GU negative Renal ROS  negative genitourinary   Musculoskeletal  (+) Arthritis , Osteoarthritis,    Abdominal   Peds  Hematology negative hematology ROS (+)   Anesthesia Other Findings   Reproductive/Obstetrics negative OB ROS                           Anesthesia Physical Anesthesia Plan  ASA: II  Anesthesia Plan: General   Post-op Pain Management:    Induction: Intravenous  PONV Risk Score and Plan: 3 and Ondansetron, Dexamethasone and Midazolam  Airway Management Planned: Double Lumen EBT  Additional Equipment: CVP and Arterial line  Intra-op Plan:   Post-operative Plan: Extubation in OR  Informed Consent: I have reviewed the patients History and Physical, chart, labs and discussed the procedure including the risks, benefits and alternatives for the proposed anesthesia with the patient or authorized representative who has indicated his/her understanding and acceptance.   Dental advisory given and Dental Advisory Given  Plan Discussed with: CRNA  Anesthesia Plan Comments:       Anesthesia Quick Evaluation

## 2018-05-13 ENCOUNTER — Encounter (HOSPITAL_COMMUNITY)
Admission: RE | Disposition: A | Discharge status: Home / Self Care | Payer: Self-pay | Source: Ambulatory Visit | Attending: Thoracic Surgery (Cardiothoracic Vascular Surgery)

## 2018-05-13 ENCOUNTER — Inpatient Hospital Stay (HOSPITAL_COMMUNITY): Payer: Medicare Other

## 2018-05-13 ENCOUNTER — Inpatient Hospital Stay (HOSPITAL_COMMUNITY)
Admission: RE | Admit: 2018-05-13 | Discharge: 2018-05-22 | DRG: 164 | Disposition: A | Discharge status: Home / Self Care | Payer: Medicare Other | Source: Ambulatory Visit | Attending: Thoracic Surgery (Cardiothoracic Vascular Surgery) | Admitting: Thoracic Surgery (Cardiothoracic Vascular Surgery)

## 2018-05-13 ENCOUNTER — Encounter (HOSPITAL_COMMUNITY): Payer: Self-pay | Admitting: Anesthesiology

## 2018-05-13 ENCOUNTER — Other Ambulatory Visit: Payer: Self-pay

## 2018-05-13 DIAGNOSIS — F17200 Nicotine dependence, unspecified, uncomplicated: Secondary | ICD-10-CM | POA: Diagnosis present

## 2018-05-13 DIAGNOSIS — D649 Anemia, unspecified: Secondary | ICD-10-CM | POA: Diagnosis present

## 2018-05-13 DIAGNOSIS — I471 Supraventricular tachycardia: Secondary | ICD-10-CM | POA: Diagnosis not present

## 2018-05-13 DIAGNOSIS — Z9841 Cataract extraction status, right eye: Secondary | ICD-10-CM

## 2018-05-13 DIAGNOSIS — J9382 Other air leak: Secondary | ICD-10-CM | POA: Diagnosis not present

## 2018-05-13 DIAGNOSIS — Z885 Allergy status to narcotic agent status: Secondary | ICD-10-CM | POA: Diagnosis not present

## 2018-05-13 DIAGNOSIS — T797XXA Traumatic subcutaneous emphysema, initial encounter: Secondary | ICD-10-CM | POA: Diagnosis present

## 2018-05-13 DIAGNOSIS — I4510 Unspecified right bundle-branch block: Secondary | ICD-10-CM | POA: Diagnosis present

## 2018-05-13 DIAGNOSIS — Z96653 Presence of artificial knee joint, bilateral: Secondary | ICD-10-CM | POA: Diagnosis present

## 2018-05-13 DIAGNOSIS — E785 Hyperlipidemia, unspecified: Secondary | ICD-10-CM | POA: Diagnosis not present

## 2018-05-13 DIAGNOSIS — R911 Solitary pulmonary nodule: Secondary | ICD-10-CM | POA: Diagnosis not present

## 2018-05-13 DIAGNOSIS — Z7982 Long term (current) use of aspirin: Secondary | ICD-10-CM

## 2018-05-13 DIAGNOSIS — J449 Chronic obstructive pulmonary disease, unspecified: Secondary | ICD-10-CM | POA: Diagnosis not present

## 2018-05-13 DIAGNOSIS — I2510 Atherosclerotic heart disease of native coronary artery without angina pectoris: Secondary | ICD-10-CM | POA: Diagnosis not present

## 2018-05-13 DIAGNOSIS — I10 Essential (primary) hypertension: Secondary | ICD-10-CM | POA: Diagnosis not present

## 2018-05-13 DIAGNOSIS — K219 Gastro-esophageal reflux disease without esophagitis: Secondary | ICD-10-CM | POA: Diagnosis present

## 2018-05-13 DIAGNOSIS — I4892 Unspecified atrial flutter: Secondary | ICD-10-CM | POA: Diagnosis present

## 2018-05-13 DIAGNOSIS — J939 Pneumothorax, unspecified: Secondary | ICD-10-CM | POA: Diagnosis not present

## 2018-05-13 DIAGNOSIS — C3412 Malignant neoplasm of upper lobe, left bronchus or lung: Principal | ICD-10-CM | POA: Diagnosis present

## 2018-05-13 DIAGNOSIS — Z90710 Acquired absence of both cervix and uterus: Secondary | ICD-10-CM

## 2018-05-13 DIAGNOSIS — I480 Paroxysmal atrial fibrillation: Secondary | ICD-10-CM | POA: Diagnosis present

## 2018-05-13 DIAGNOSIS — M8580 Other specified disorders of bone density and structure, unspecified site: Secondary | ICD-10-CM | POA: Diagnosis present

## 2018-05-13 DIAGNOSIS — Z85828 Personal history of other malignant neoplasm of skin: Secondary | ICD-10-CM | POA: Diagnosis not present

## 2018-05-13 DIAGNOSIS — Z8249 Family history of ischemic heart disease and other diseases of the circulatory system: Secondary | ICD-10-CM

## 2018-05-13 DIAGNOSIS — I2584 Coronary atherosclerosis due to calcified coronary lesion: Secondary | ICD-10-CM | POA: Diagnosis not present

## 2018-05-13 DIAGNOSIS — J9811 Atelectasis: Secondary | ICD-10-CM | POA: Diagnosis present

## 2018-05-13 DIAGNOSIS — Z888 Allergy status to other drugs, medicaments and biological substances status: Secondary | ICD-10-CM

## 2018-05-13 DIAGNOSIS — I4891 Unspecified atrial fibrillation: Secondary | ICD-10-CM | POA: Diagnosis not present

## 2018-05-13 DIAGNOSIS — Z9842 Cataract extraction status, left eye: Secondary | ICD-10-CM

## 2018-05-13 DIAGNOSIS — K5730 Diverticulosis of large intestine without perforation or abscess without bleeding: Secondary | ICD-10-CM | POA: Diagnosis present

## 2018-05-13 DIAGNOSIS — Z882 Allergy status to sulfonamides status: Secondary | ICD-10-CM

## 2018-05-13 DIAGNOSIS — I700 Atherosclerosis of aorta: Secondary | ICD-10-CM | POA: Diagnosis present

## 2018-05-13 DIAGNOSIS — Z720 Tobacco use: Secondary | ICD-10-CM | POA: Diagnosis not present

## 2018-05-13 DIAGNOSIS — Z902 Acquired absence of lung [part of]: Secondary | ICD-10-CM

## 2018-05-13 HISTORY — PX: VIDEO ASSISTED THORACOSCOPY (VATS)/ LOBECTOMY: SHX6169

## 2018-05-13 LAB — POCT I-STAT 7, (LYTES, BLD GAS, ICA,H+H)
Acid-base deficit: 2 mmol/L (ref 0.0–2.0)
Bicarbonate: 25.9 mmol/L (ref 20.0–28.0)
CALCIUM ION: 1.19 mmol/L (ref 1.15–1.40)
HEMATOCRIT: 35 % — AB (ref 36.0–46.0)
Hemoglobin: 11.9 g/dL — ABNORMAL LOW (ref 12.0–15.0)
O2 Saturation: 85 %
PH ART: 7.304 — AB (ref 7.350–7.450)
POTASSIUM: 4.1 mmol/L (ref 3.5–5.1)
Patient temperature: 34.8
Sodium: 138 mmol/L (ref 135–145)
TCO2: 28 mmol/L (ref 22–32)
pCO2 arterial: 50.8 mmHg — ABNORMAL HIGH (ref 32.0–48.0)
pO2, Arterial: 50 mmHg — ABNORMAL LOW (ref 83.0–108.0)

## 2018-05-13 LAB — GLUCOSE, CAPILLARY: Glucose-Capillary: 123 mg/dL — ABNORMAL HIGH (ref 70–99)

## 2018-05-13 SURGERY — VIDEO ASSISTED THORACOSCOPY (VATS)/ LOBECTOMY
Anesthesia: General | Site: Chest | Laterality: Left

## 2018-05-13 MED ORDER — DEXAMETHASONE SODIUM PHOSPHATE 10 MG/ML IJ SOLN
INTRAMUSCULAR | Status: AC
  Filled 2018-05-13: qty 1

## 2018-05-13 MED ORDER — POTASSIUM CHLORIDE 10 MEQ/50ML IV SOLN: 10.0000 meq | Freq: Every day | INTRAVENOUS | Status: DC | PRN

## 2018-05-13 MED ORDER — ALBUMIN HUMAN 5 % IV SOLN: 12.5000 g | Freq: Once | INTRAVENOUS | Status: DC

## 2018-05-13 MED ORDER — ASPIRIN EC 81 MG PO TBEC
81.0000 mg | DELAYED_RELEASE_TABLET | ORAL | Status: DC
  Administered 2018-05-14 – 2018-05-20 (×4): 81 mg via ORAL
  Filled 2018-05-13 (×6): qty 1

## 2018-05-13 MED ORDER — BUPIVACAINE HCL (PF) 0.5 % IJ SOLN
INTRAMUSCULAR | Status: AC
  Filled 2018-05-13: qty 30

## 2018-05-13 MED ORDER — LACTATED RINGERS IV SOLN
INTRAVENOUS | Status: DC | PRN
  Administered 2018-05-13: 08:00:00 via INTRAVENOUS

## 2018-05-13 MED ORDER — FENTANYL 40 MCG/ML IV SOLN
INTRAVENOUS | Status: DC
  Administered 2018-05-13: 1000 ug via INTRAVENOUS
  Administered 2018-05-13: 0 ug via INTRAVENOUS
  Administered 2018-05-13 – 2018-05-14 (×2): 15 ug via INTRAVENOUS
  Filled 2018-05-13: qty 25

## 2018-05-13 MED ORDER — HYDROMORPHONE HCL 1 MG/ML IJ SOLN
0.2500 mg | INTRAMUSCULAR | Status: DC | PRN
  Administered 2018-05-13 (×2): 0.5 mg via INTRAVENOUS

## 2018-05-13 MED ORDER — ACETAMINOPHEN 500 MG PO TABS
1000.0000 mg | ORAL_TABLET | Freq: Four times a day (QID) | ORAL | Status: AC
  Administered 2018-05-13 – 2018-05-18 (×18): 1000 mg via ORAL
  Filled 2018-05-13 (×19): qty 2

## 2018-05-13 MED ORDER — SODIUM BICARBONATE 8.4 % IV SOLN
INTRAVENOUS | Status: DC | PRN
  Administered 2018-05-13: 25 meq via INTRAVENOUS

## 2018-05-13 MED ORDER — ROCURONIUM BROMIDE 10 MG/ML (PF) SYRINGE
PREFILLED_SYRINGE | INTRAVENOUS | Status: DC | PRN
  Administered 2018-05-13: 20 mg via INTRAVENOUS
  Administered 2018-05-13: 50 mg via INTRAVENOUS
  Administered 2018-05-13: 10 mg via INTRAVENOUS
  Administered 2018-05-13: 5 mg via INTRAVENOUS

## 2018-05-13 MED ORDER — SODIUM CHLORIDE 0.9 % IV SOLN
INTRAVENOUS | Status: DC | PRN
  Administered 2018-05-13: 100 mL

## 2018-05-13 MED ORDER — ONDANSETRON HCL 4 MG/2ML IJ SOLN
INTRAMUSCULAR | Status: DC | PRN
  Administered 2018-05-13: 4 mg via INTRAVENOUS

## 2018-05-13 MED ORDER — DIPHENHYDRAMINE HCL 50 MG/ML IJ SOLN: 12.5000 mg | Freq: Four times a day (QID) | INTRAMUSCULAR | Status: DC | PRN

## 2018-05-13 MED ORDER — DEXAMETHASONE SODIUM PHOSPHATE 10 MG/ML IJ SOLN
INTRAMUSCULAR | Status: DC | PRN
  Administered 2018-05-13: 5 mg via INTRAVENOUS

## 2018-05-13 MED ORDER — PROPOFOL 10 MG/ML IV BOLUS
INTRAVENOUS | Status: AC
  Filled 2018-05-13: qty 20

## 2018-05-13 MED ORDER — PROPOFOL 10 MG/ML IV BOLUS
INTRAVENOUS | Status: DC | PRN
  Administered 2018-05-13: 80 mg via INTRAVENOUS

## 2018-05-13 MED ORDER — HYDROMORPHONE HCL 1 MG/ML IJ SOLN
INTRAMUSCULAR | Status: AC
  Filled 2018-05-13: qty 1

## 2018-05-13 MED ORDER — NALOXONE HCL 0.4 MG/ML IJ SOLN: 0.4000 mg | INTRAMUSCULAR | Status: DC | PRN

## 2018-05-13 MED ORDER — POTASSIUM CHLORIDE IN NACL 20-0.9 MEQ/L-% IV SOLN
INTRAVENOUS | Status: DC
  Administered 2018-05-13 – 2018-05-16 (×3): via INTRAVENOUS
  Filled 2018-05-13 (×4): qty 1000

## 2018-05-13 MED ORDER — SODIUM CHLORIDE 0.9 % IV SOLN
INTRAVENOUS | Status: DC | PRN
  Administered 2018-05-13: 35 ug/min via INTRAVENOUS

## 2018-05-13 MED ORDER — SUGAMMADEX SODIUM 200 MG/2ML IV SOLN
INTRAVENOUS | Status: DC | PRN
  Administered 2018-05-13: 130 mg via INTRAVENOUS

## 2018-05-13 MED ORDER — ALBUMIN HUMAN 5 % IV SOLN
INTRAVENOUS | Status: DC | PRN
  Administered 2018-05-13: 11:00:00 via INTRAVENOUS

## 2018-05-13 MED ORDER — INSULIN ASPART 100 UNIT/ML ~~LOC~~ SOLN
0.0000 [IU] | Freq: Four times a day (QID) | SUBCUTANEOUS | Status: DC
  Administered 2018-05-13 – 2018-05-14 (×2): 2 [IU] via SUBCUTANEOUS

## 2018-05-13 MED ORDER — CEFAZOLIN SODIUM-DEXTROSE 2-4 GM/100ML-% IV SOLN
2.0000 g | INTRAVENOUS | Status: AC
  Administered 2018-05-13: 2 g via INTRAVENOUS
  Filled 2018-05-13: qty 100

## 2018-05-13 MED ORDER — ONDANSETRON HCL 4 MG/2ML IJ SOLN: 4.0000 mg | Freq: Four times a day (QID) | INTRAMUSCULAR | Status: DC | PRN

## 2018-05-13 MED ORDER — MIDAZOLAM HCL 2 MG/2ML IJ SOLN
INTRAMUSCULAR | Status: AC
  Filled 2018-05-13: qty 2

## 2018-05-13 MED ORDER — MIDAZOLAM HCL 5 MG/5ML IJ SOLN
INTRAMUSCULAR | Status: DC | PRN
  Administered 2018-05-13 (×2): 1 mg via INTRAVENOUS

## 2018-05-13 MED ORDER — ACETAMINOPHEN 160 MG/5ML PO SOLN
1000.0000 mg | Freq: Four times a day (QID) | ORAL | Status: AC
  Filled 2018-05-13: qty 40.6

## 2018-05-13 MED ORDER — FENTANYL CITRATE (PF) 100 MCG/2ML IJ SOLN
INTRAMUSCULAR | Status: DC | PRN
  Administered 2018-05-13 (×3): 50 ug via INTRAVENOUS

## 2018-05-13 MED ORDER — BUPIVACAINE LIPOSOME 1.3 % IJ SUSP
20.0000 mL | Freq: Once | INTRAMUSCULAR | Status: DC
  Filled 2018-05-13: qty 20

## 2018-05-13 MED ORDER — TRAMADOL HCL 50 MG PO TABS
50.0000 mg | ORAL_TABLET | Freq: Four times a day (QID) | ORAL | Status: DC | PRN
  Administered 2018-05-14 – 2018-05-21 (×4): 50 mg via ORAL
  Filled 2018-05-13 (×4): qty 1

## 2018-05-13 MED ORDER — POLYETHYLENE GLYCOL 3350 17 G PO PACK
8.5000 g | PACK | ORAL | Status: DC
  Administered 2018-05-14 – 2018-05-20 (×2): 8.5 g via ORAL
  Filled 2018-05-13 (×4): qty 1

## 2018-05-13 MED ORDER — ALBUMIN HUMAN 5 % IV SOLN
INTRAVENOUS | Status: AC
  Administered 2018-05-13: 14:00:00
  Filled 2018-05-13: qty 250

## 2018-05-13 MED ORDER — CEFAZOLIN SODIUM-DEXTROSE 2-4 GM/100ML-% IV SOLN
2.0000 g | Freq: Three times a day (TID) | INTRAVENOUS | Status: AC
  Administered 2018-05-13 – 2018-05-14 (×2): 2 g via INTRAVENOUS
  Filled 2018-05-13 (×2): qty 100

## 2018-05-13 MED ORDER — BISACODYL 5 MG PO TBEC
10.0000 mg | DELAYED_RELEASE_TABLET | Freq: Every day | ORAL | Status: DC
  Administered 2018-05-13 – 2018-05-19 (×5): 10 mg via ORAL
  Filled 2018-05-13 (×8): qty 2

## 2018-05-13 MED ORDER — ADULT MULTIVITAMIN LIQUID CH
Freq: Every day | ORAL | Status: DC
  Administered 2018-05-14 – 2018-05-16 (×3): 15 mL via ORAL
  Administered 2018-05-17: 09:00:00 via ORAL
  Administered 2018-05-18 – 2018-05-22 (×5): 15 mL via ORAL
  Filled 2018-05-13 (×9): qty 15

## 2018-05-13 MED ORDER — 0.9 % SODIUM CHLORIDE (POUR BTL) OPTIME
TOPICAL | Status: DC | PRN
  Administered 2018-05-13: 1000 mL

## 2018-05-13 MED ORDER — SIMVASTATIN 20 MG PO TABS
20.0000 mg | ORAL_TABLET | Freq: Every day | ORAL | Status: DC
  Administered 2018-05-14 – 2018-05-21 (×8): 20 mg via ORAL
  Filled 2018-05-13 (×9): qty 1

## 2018-05-13 MED ORDER — METOPROLOL SUCCINATE ER 25 MG PO TB24: 25.0000 mg | ORAL_TABLET | Freq: Every day | ORAL | Status: DC

## 2018-05-13 MED ORDER — SODIUM CHLORIDE 0.9% FLUSH: 9.0000 mL | INTRAVENOUS | Status: DC | PRN

## 2018-05-13 MED ORDER — SENNOSIDES-DOCUSATE SODIUM 8.6-50 MG PO TABS
1.0000 | ORAL_TABLET | Freq: Every day | ORAL | Status: DC
  Administered 2018-05-13 – 2018-05-21 (×8): 1 via ORAL
  Filled 2018-05-13 (×11): qty 1

## 2018-05-13 MED ORDER — FENTANYL CITRATE (PF) 250 MCG/5ML IJ SOLN
INTRAMUSCULAR | Status: AC
  Filled 2018-05-13: qty 5

## 2018-05-13 MED ORDER — ENOXAPARIN SODIUM 40 MG/0.4ML ~~LOC~~ SOLN
40.0000 mg | SUBCUTANEOUS | Status: DC
  Administered 2018-05-14 – 2018-05-21 (×8): 40 mg via SUBCUTANEOUS
  Filled 2018-05-13 (×8): qty 0.4

## 2018-05-13 MED ORDER — EPHEDRINE SULFATE 50 MG/ML IJ SOLN
INTRAMUSCULAR | Status: DC | PRN
  Administered 2018-05-13: 10 mg via INTRAVENOUS

## 2018-05-13 MED ORDER — DIPHENHYDRAMINE HCL 12.5 MG/5ML PO ELIX
12.5000 mg | ORAL_SOLUTION | Freq: Four times a day (QID) | ORAL | Status: DC | PRN
  Filled 2018-05-13: qty 5

## 2018-05-13 MED ORDER — INFLUENZA VAC SPLIT HIGH-DOSE 0.5 ML IM SUSY: 0.5000 mL | PREFILLED_SYRINGE | INTRAMUSCULAR | Status: DC | PRN

## 2018-05-13 SURGICAL SUPPLY — 86 items
APPLIER CLIP ROT 10 11.4 M/L (STAPLE) ×3
CANISTER SUCT 3000ML PPV (MISCELLANEOUS) ×3 IMPLANT
CATH THORACIC 28FR (CATHETERS) ×3 IMPLANT
CATH THORACIC 28FR RT ANG (CATHETERS) IMPLANT
CATH THORACIC 36FR (CATHETERS) IMPLANT
CATH THORACIC 36FR RT ANG (CATHETERS) IMPLANT
CLIP APPLIE ROT 10 11.4 M/L (STAPLE) ×1 IMPLANT
CLIP VESOCCLUDE MED 6/CT (CLIP) ×3 IMPLANT
CONN ST 1/4X3/8  BEN (MISCELLANEOUS)
CONN ST 1/4X3/8 BEN (MISCELLANEOUS) IMPLANT
CONN Y 3/8X3/8X3/8  BEN (MISCELLANEOUS)
CONN Y 3/8X3/8X3/8 BEN (MISCELLANEOUS) IMPLANT
CONT SPEC 4OZ CLIKSEAL STRL BL (MISCELLANEOUS) ×33 IMPLANT
COVER SURGICAL LIGHT HANDLE (MISCELLANEOUS) IMPLANT
DERMABOND ADVANCED (GAUZE/BANDAGES/DRESSINGS) ×4
DERMABOND ADVANCED .7 DNX12 (GAUZE/BANDAGES/DRESSINGS) ×2 IMPLANT
DRAIN CHANNEL 28F RND 3/8 FF (WOUND CARE) IMPLANT
DRAIN CHANNEL 32F RND 10.7 FF (WOUND CARE) IMPLANT
DRAPE LAPAROSCOPIC ABDOMINAL (DRAPES) ×3 IMPLANT
DRAPE WARM FLUID 44X44 (DRAPE) ×3 IMPLANT
ELECT BLADE 6.5 EXT (BLADE) ×3 IMPLANT
ELECT REM PT RETURN 9FT ADLT (ELECTROSURGICAL) ×3
ELECTRODE REM PT RTRN 9FT ADLT (ELECTROSURGICAL) ×1 IMPLANT
GAUZE SPONGE 4X4 12PLY STRL (GAUZE/BANDAGES/DRESSINGS) ×3 IMPLANT
GLOVE SURG SIGNA 7.5 PF LTX (GLOVE) ×6 IMPLANT
GOWN STRL REUS W/ TWL LRG LVL3 (GOWN DISPOSABLE) ×4 IMPLANT
GOWN STRL REUS W/ TWL XL LVL3 (GOWN DISPOSABLE) ×1 IMPLANT
GOWN STRL REUS W/TWL LRG LVL3 (GOWN DISPOSABLE) ×8
GOWN STRL REUS W/TWL XL LVL3 (GOWN DISPOSABLE) ×2
HEMOSTAT SURGICEL 2X14 (HEMOSTASIS) IMPLANT
KIT BASIN OR (CUSTOM PROCEDURE TRAY) ×3 IMPLANT
KIT SUCTION CATH 14FR (SUCTIONS) IMPLANT
KIT TURNOVER KIT B (KITS) ×3 IMPLANT
NEEDLE HYPO 25GX1X1/2 BEV (NEEDLE) ×3 IMPLANT
NEEDLE SPNL 18GX3.5 QUINCKE PK (NEEDLE) ×3 IMPLANT
NEEDLE SPNL 22GX3.5 QUINCKE BK (NEEDLE) ×3 IMPLANT
NS IRRIG 1000ML POUR BTL (IV SOLUTION) ×9 IMPLANT
PACK CHEST (CUSTOM PROCEDURE TRAY) ×3 IMPLANT
PAD ARMBOARD 7.5X6 YLW CONV (MISCELLANEOUS) ×9 IMPLANT
POUCH ENDO CATCH II 15MM (MISCELLANEOUS) ×3 IMPLANT
POUCH SPECIMEN RETRIEVAL 10MM (ENDOMECHANICALS) IMPLANT
RELOAD STAPLER GOLD 60MM (STAPLE) ×8 IMPLANT
RELOAD STAPLER GREEN 60MM (STAPLE) ×2 IMPLANT
SCISSORS ENDO CVD 5DCS (MISCELLANEOUS) IMPLANT
SEALANT PROGEL (MISCELLANEOUS) IMPLANT
SEALANT SURG COSEAL 4ML (VASCULAR PRODUCTS) IMPLANT
SEALANT SURG COSEAL 8ML (VASCULAR PRODUCTS) IMPLANT
SHEARS HARMONIC HDI 20CM (ELECTROSURGICAL) ×3 IMPLANT
SOLUTION ANTI FOG 6CC (MISCELLANEOUS) ×3 IMPLANT
SPECIMEN JAR MEDIUM (MISCELLANEOUS) IMPLANT
SPONGE INTESTINAL PEANUT (DISPOSABLE) IMPLANT
SPONGE TONSIL TAPE 1 RFD (DISPOSABLE) ×3 IMPLANT
STAPLE ECHEON FLEX 60 POW ENDO (STAPLE) ×3 IMPLANT
STAPLE RELOAD 2.5MM WHITE (STAPLE) ×18 IMPLANT
STAPLER RELOAD GOLD 60MM (STAPLE) ×24
STAPLER RELOAD GREEN 60MM (STAPLE) ×6
STAPLER VASCULAR ECHELON 35 (CUTTER) ×3 IMPLANT
SUT PROLENE 4 0 RB 1 (SUTURE)
SUT PROLENE 4-0 RB1 .5 CRCL 36 (SUTURE) IMPLANT
SUT SILK  1 MH (SUTURE) ×2
SUT SILK 1 MH (SUTURE) ×1 IMPLANT
SUT SILK 1 TIES 10X30 (SUTURE) ×3 IMPLANT
SUT SILK 2 0 SH (SUTURE) IMPLANT
SUT SILK 2 0SH CR/8 30 (SUTURE) IMPLANT
SUT SILK 3 0 SH 30 (SUTURE) IMPLANT
SUT SILK 3 0SH CR/8 30 (SUTURE) ×3 IMPLANT
SUT VIC AB 1 CTX 36 (SUTURE) ×4
SUT VIC AB 1 CTX36XBRD ANBCTR (SUTURE) ×2 IMPLANT
SUT VIC AB 2-0 CTX 36 (SUTURE) ×6 IMPLANT
SUT VIC AB 2-0 UR6 27 (SUTURE) ×3 IMPLANT
SUT VIC AB 3-0 MH 27 (SUTURE) IMPLANT
SUT VIC AB 3-0 SH 27 (SUTURE) ×6
SUT VIC AB 3-0 SH 27X BRD (SUTURE) ×3 IMPLANT
SUT VIC AB 3-0 X1 27 (SUTURE) ×6 IMPLANT
SUT VICRYL 2 TP 1 (SUTURE) IMPLANT
SYR 10ML LL (SYRINGE) ×3 IMPLANT
SYR 30ML LL (SYRINGE) ×3 IMPLANT
SYSTEM SAHARA CHEST DRAIN ATS (WOUND CARE) ×3 IMPLANT
TAPE CLOTH 4X10 WHT NS (GAUZE/BANDAGES/DRESSINGS) ×3 IMPLANT
TAPE CLOTH SURG 4X10 WHT LF (GAUZE/BANDAGES/DRESSINGS) ×3 IMPLANT
TIP APPLICATOR SPRAY EXTEND 16 (VASCULAR PRODUCTS) IMPLANT
TOWEL GREEN STERILE (TOWEL DISPOSABLE) ×3 IMPLANT
TOWEL GREEN STERILE FF (TOWEL DISPOSABLE) ×3 IMPLANT
TRAY FOLEY MTR SLVR 16FR STAT (SET/KITS/TRAYS/PACK) ×3 IMPLANT
TROCAR XCEL BLADELESS 5X75MML (TROCAR) ×9 IMPLANT
WATER STERILE IRR 1000ML POUR (IV SOLUTION) ×3 IMPLANT

## 2018-05-13 NOTE — Interval H&P Note (Signed)
History and Physical Interval Note:  05/13/2018 8:16 AM  [name]  has presented today for surgery, with the diagnosis of LUL NODULE  The various methods of treatment have been discussed with the patient and family. After consideration of risks, benefits and other options for treatment, the patient has consented to  Procedure(s): VIDEO ASSISTED THORACOSCOPY (VATS)/LEFT UPPER LOBECTOMY (Left) as a surgical intervention .  The patient's history has been reviewed, patient examined, no change in status, stable for surgery.  I have reviewed the patient's chart and labs.  Questions were answered to the patient's satisfaction.     Melrose Nakayama

## 2018-05-13 NOTE — Progress Notes (Signed)
Pt arrived from PACU. Oriented to unit and routine. Call bell in place. CT x 1 present and applied to suction, moderate to large air leak noted, hear on assessment as well. Some SubQ air noted around site. No complaints of pain at time of arrival. VSS. CMT notified. PCA button explained and provided. Family visiting. Will continue to monitor for changes.

## 2018-05-13 NOTE — Anesthesia Procedure Notes (Signed)
Arterial Line Insertion Start/End9/12/2017 8:15 AM, 05/13/2018 8:27 AM Performed by: Josephine Igo, CRNA, CRNA  Patient location: Pre-op. Preanesthetic checklist: patient identified, IV checked, surgical consent and pre-op evaluation Lidocaine 1% used for infiltration and patient sedated Left, radial was placed Catheter size: 20 G Hand hygiene performed   Attempts: 3 Procedure performed without using ultrasound guided technique. Following insertion, dressing applied and Biopatch. Post procedure assessment: normal  Patient tolerated the procedure well with no immediate complications.

## 2018-05-13 NOTE — Anesthesia Procedure Notes (Signed)
Arterial Line Insertion Start/End9/12/2017 8:10 AM, 05/13/2018 8:25 AM Performed by: Josephine Igo, CRNA, CRNA  Patient location: Pre-op. Preanesthetic checklist: patient identified, IV checked, site marked, risks and benefits discussed, surgical consent, monitors and equipment checked, pre-op evaluation and timeout performed Lidocaine 1% used for infiltration and patient sedated Left, radial was placed Catheter size: 20 G Hand hygiene performed  and maximum sterile barriers used   Attempts: 4 Procedure performed without using ultrasound guided technique. Following insertion, Biopatch and dressing applied. Post procedure assessment: normal and unchanged  Patient tolerated the procedure well with no immediate complications.

## 2018-05-13 NOTE — Progress Notes (Signed)
Made Lucianne Lei Tright aware of moderate to large air leak in chest tube. Also tidaling in chest tube. VSS. No c/o unusual pain. On room air at 94%. Uses Fentanyl PCA as needed. Will continue to observe. Asymptomatic at this time.

## 2018-05-13 NOTE — Anesthesia Postprocedure Evaluation (Signed)
Anesthesia Post Note  Patient: SHERILYNN DIEU  Procedure(s) Performed: VIDEO ASSISTED THORACOSCOPY (VATS)/LEFT UPPER LOBECTOMY (Left Chest)     Patient location during evaluation: PACU Anesthesia Type: General Level of consciousness: awake and alert Pain management: pain level controlled Vital Signs Assessment: post-procedure vital signs reviewed and stable Respiratory status: spontaneous breathing, nonlabored ventilation, respiratory function stable and patient connected to nasal cannula oxygen Cardiovascular status: blood pressure returned to baseline and stable Postop Assessment: no apparent nausea or vomiting Anesthetic complications: no    Last Vitals:  Vitals:   05/13/18 1345 05/13/18 1400  BP: 92/60 (!) 100/58  Pulse: (!) 55 (!) 53  Resp: 13 11  Temp:    SpO2: 100% 100%    Last Pain:  Vitals:   05/13/18 1345  TempSrc:   PainSc: Asleep                 Yonatan Guitron,W. EDMOND

## 2018-05-13 NOTE — Transfer of Care (Signed)
Immediate Anesthesia Transfer of Care Note  Patient: [name]  Procedure(s) Performed: VIDEO ASSISTED THORACOSCOPY (VATS)/LEFT UPPER LOBECTOMY (Left Chest)  Patient Location: PACU  Anesthesia Type:General  Level of Consciousness: awake, alert  and oriented  Airway & Oxygen Therapy: Patient Spontanous Breathing and Patient connected to face mask oxygen  Post-op Assessment: Report given to RN, Post -op Vital signs reviewed and stable and Patient moving all extremities X 4  Post vital signs: Reviewed and stable  Last Vitals:  Vitals Value Taken Time  BP    Temp    Pulse 65 05/13/2018 12:17 PM  Resp 16 05/13/2018 12:17 PM  SpO2 100 % 05/13/2018 12:17 PM  Vitals shown include unvalidated device data.  Last Pain:  Vitals:   05/13/18 0642  TempSrc:   PainSc: 0-No pain         Complications: No apparent anesthesia complications

## 2018-05-13 NOTE — Brief Op Note (Signed)
05/13/2018  11:55 AM  PATIENT:  [name]  77 y.o. female  PRE-OPERATIVE DIAGNOSIS:  LUL NODULE  POST-OPERATIVE DIAGNOSIS:  LUL NODULE  PROCEDURE:  LEFT VIDEO ASSISTED THORACOSCOPY (VATS),THORACOSCOPIC LEFT UPPER LOBECTOMY, LYMPH NODE DISSECTION, with EXPAREL NERVE BLOCK  SURGEON:  Surgeon(s) and Role:    Melrose Nakayama, MD - Primary  PHYSICIAN ASSISTANT: Lars Pinks PA-C  ANESTHESIA:   general  EBL:  300 mL   BLOOD ADMINISTERED:none  DRAINS: 28 French chest tube placed in the left pleural space   LOCAL MEDICATIONS USED: Exparel  SPECIMEN:  Source of Specimen:  LUL,  multiple lymph nodes  DISPOSITION OF SPECIMEN:  PATHOLOGY  COUNTS CORRECT:  YES  DICTATION: .Dragon Dictation  PLAN OF CARE: Admit to inpatient   PATIENT DISPOSITION:  PACU - hemodynamically stable.   Delay start of Pharmacological VTE agent (>24hrs) due to surgical blood loss or risk of bleeding: yes

## 2018-05-13 NOTE — Anesthesia Procedure Notes (Signed)
Central Venous Catheter Insertion Performed by: Roderic Palau, MD, anesthesiologist Start/End9/12/2017 7:50 AM, 05/13/2018 8:00 AM Patient location: Pre-op. Preanesthetic checklist: patient identified, IV checked, site marked, risks and benefits discussed, surgical consent, monitors and equipment checked, pre-op evaluation, timeout performed and anesthesia consent Position: Trendelenburg Lidocaine 1% used for infiltration and patient sedated Hand hygiene performed , maximum sterile barriers used  and Seldinger technique used Catheter size: 8 Fr Total catheter length 16. Central line was placed.Double lumen Procedure performed without using ultrasound guided technique. Attempts: 1 Following insertion, dressing applied, line sutured and Biopatch. Post procedure assessment: blood return through all ports  Patient tolerated the procedure well with no immediate complications.

## 2018-05-13 NOTE — Op Note (Signed)
NAME: Mcgarvey, Zoelle A. MEDICAL RECORD [id number] ACCOUNT [id number] DATE OF BIRTH:08-Aug-1941 FACILITY: [facility] LOCATION: [facility] PHYSICIAN:Karron Alvizo C. 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  POSTOPERATIVE DIAGNOSIS:  Stage IA nonsmall-cell carcinoma, right upper lobe (T1, N0).  PROCEDURES:   Left video-assisted thoracoscopy, Thoracoscopic Left upper lobectomy, Mediastinal lymph node dissection, Intercostal nerve block.  SURGEON:  Modesto Charon, MD  ASSISTANT:  Lars Pinks, PA-C  ANESTHESIA:  General.  FINDINGS:  Incomplete fissures. Multiple large, but otherwise benign appearing lymph nodes.  Bronchial margin negative for tumor. Nodule positive for nonsmall cell carcinoma.  CLINICAL NOTE:  The patient is a 77 year old woman with a history of tobacco abuse who was found to have a left upper lobe nodule back in July of 2018.  A followup CT showed an increase in size of the nodule and the nodule was intensely hypermetabolic on  PET CT.  This was felt to be most consistent with a primary bronchogenic carcinoma.  She was advised to undergo a left VATS for left upper lobectomy for definitive diagnosis and staging.  Indications, risks, benefits, and alternatives were discussed in  detail with the patient.  She understood and accepted the risks and agreed to proceed.  OPERATIVE NOTE:  The patient was brought to the preoperative holding area on 05/13/2018.  Anesthesia placed a central line and an arterial blood pressure monitoring line.  She was taken to the operating room, anesthetized and intubated.  A Foley catheter was  placed.  Intravenous antibiotics were administered.  Sequential compression devices were placed on the calves for DVT prophylaxis.  She was placed in a right lateral decubitus position and the left chest was prepped and draped  in the usual sterile  fashion.  Single lung ventilation of the right lung was initiated and was tolerated well throughout the procedure.  An incision was made in the seventh interspace in the mid axillary line, and a 5 mm port was inserted into the chest.  Prior to making the incision, the area was infiltrated with a dilute bupivacaine solution.  The bupivacaine solution contained 20 mL of  liposomal bupivacaine, 30 mL of 0.5% bupivacaine and 50 mL of saline.  This was injected at all the incisions, and also used for the intercostal nerve blocks.  On placement of the port there was a small laceration of the left lower lobe.  This was later  oversewn with a 3-0 Vicryl suture.  There was good isolation of the left lung, although it was relatively slow to deflate.  Site was selected for incision in the fourth interspace.  This area was infiltrated with the bupivacaine solution and then an  incision was made.  No rib spreading was performed during the procedure.  Intercostal nerve blocks were performed using the bupivacaine solution.  Five mL was injected into each interspace from the 3rd to the 9th.  This was done in a subpleural plane.  The inferior ligament was divided.  All lymph nodes that were encountered during the dissection were removed and sent as separate specimens for permanent pathology.  The hilum was inspected.  The pleural reflection was divided at the hilum anteriorly,  posteriorly and superiorly.  The fissure was almost entirely incomplete.  Two firings of the Echelon stapler with a gold cartridge was used to begin the division of the upper and lower lobes, starting anteriorly at the lingula.  This allowed  exposure of  the superior pulmonary vein branches which were dissected out, encircled and divided with the endoscopic vascular stapler.  The dissection in the fissure was relatively difficult as the plane was hard to develop due to nodes in the area that were  bleeding.  The aortopulmonary  window lymph nodes were removed and sent for permanent pathology.  The anterior and apical pulmonary artery branches were dissected out, encircled and divided with the vascular stapler.  A posterior segmental branch then  likewise was divided.  After dividing the lingular branches, a final aberrant segmental branch was identified and was also divided with the vascular stapler.  The fissure then was completed with sequential firings of the Echelon stapler using gold  cartridges.  The left upper lobe bronchus was now isolated.  A stapler was placed across the left upper lobe bronchus and closed.  A test inflation showed good aeration of the lower lobe and no inflation of the upper lobe.  The stapler was fired  transecting the left upper lobe bronchus.  The specimen was placed into an endoscopic retrieval bag, removed and sent for frozen section.  Frozen section of the bronchial margin returned with no tumor seen.  The mass was a nonsmall cell carcinoma.  The  chest was copiously irrigated with warm saline.  A test inflation at 30 cm of water revealed minimal leakage from the fissure and no leakage from the bronchial stump.  A 28-French chest tube was placed through the more anterior port incision.  The  posterior port incision was closed in standard fashion.  The final inspection was made for hemostasis which was good.  The left lung was reinflated.  The working incision was closed in 3 layers.  The chest tube was placed to suction.  The patient was  placed back in a supine position.    She was extubated in the operating room and taken to the [facility] in good condition.  AN/NUANCE  D:05/13/2018 T:05/13/2018 JOB:[id number]

## 2018-05-13 NOTE — Anesthesia Procedure Notes (Signed)
Procedure Name: Intubation Date/Time: 05/13/2018 8:40 AM Performed by: Neldon Newport, CRNA Pre-anesthesia Checklist: Timeout performed, Patient being monitored, Suction available, Emergency Drugs available and Patient identified Patient Re-evaluated:Patient Re-evaluated prior to induction Oxygen Delivery Method: Circle system utilized Preoxygenation: Pre-oxygenation with 100% oxygen Induction Type: IV induction Ventilation: Mask ventilation without difficulty Laryngoscope Size: Mac and 3 Grade View: Grade I Tube type: MLT Endobronchial tube: Left and EBT position confirmed by fiberoptic bronchoscope and 37 Fr Number of attempts: 1 Airway Equipment and Method: Fiberoptic brochoscope Placement Confirmation: breath sounds checked- equal and bilateral,  positive ETCO2 and ETT inserted through vocal cords under direct vision Secured at: 27 cm Tube secured with: Tape Dental Injury: Teeth and Oropharynx as per pre-operative assessment

## 2018-05-14 ENCOUNTER — Encounter (HOSPITAL_COMMUNITY): Payer: Self-pay | Admitting: Thoracic Surgery (Cardiothoracic Vascular Surgery)

## 2018-05-14 ENCOUNTER — Inpatient Hospital Stay (HOSPITAL_COMMUNITY): Payer: Medicare Other

## 2018-05-14 LAB — POCT I-STAT 7, (LYTES, BLD GAS, ICA,H+H)
ACID-BASE DEFICIT: 4 mmol/L — AB (ref 0.0–2.0)
Acid-base deficit: 4 mmol/L — ABNORMAL HIGH (ref 0.0–2.0)
Bicarbonate: 25 mmol/L (ref 20.0–28.0)
Bicarbonate: 24.9 mmol/L (ref 20.0–28.0)
CALCIUM ION: 1.26 mmol/L (ref 1.15–1.40)
Calcium, Ion: 1.24 mmol/L (ref 1.15–1.40)
HCT: 36 % (ref 36.0–46.0)
HEMATOCRIT: 36 % (ref 36.0–46.0)
HEMOGLOBIN: 12.2 g/dL (ref 12.0–15.0)
Hemoglobin: 12.2 g/dL (ref 12.0–15.0)
O2 SAT: 93 %
O2 Saturation: 93 %
PCO2 ART: 55.9 mmHg — AB (ref 32.0–48.0)
PH ART: 7.217 — AB (ref 7.350–7.450)
POTASSIUM: 3.9 mmol/L (ref 3.5–5.1)
Patient temperature: 34.9
Potassium: 4 mmol/L (ref 3.5–5.1)
SODIUM: 137 mmol/L (ref 135–145)
Sodium: 137 mmol/L (ref 135–145)
TCO2: 27 mmol/L (ref 22–32)
TCO2: 27 mmol/L (ref 22–32)
pCO2 arterial: 59.9 mmHg — ABNORMAL HIGH (ref 32.0–48.0)
pH, Arterial: 7.245 — ABNORMAL LOW (ref 7.350–7.450)
pO2, Arterial: 72 mmHg — ABNORMAL LOW (ref 83.0–108.0)
pO2, Arterial: 72 mmHg — ABNORMAL LOW (ref 83.0–108.0)

## 2018-05-14 LAB — BLOOD GAS, ARTERIAL
Acid-base deficit: 2.8 mmol/L — ABNORMAL HIGH (ref 0.0–2.0)
Bicarbonate: 21 mmol/L (ref 20.0–28.0)
DRAWN BY: 350431
FIO2: 21
O2 Saturation: 94 %
PATIENT TEMPERATURE: 97.6
pCO2 arterial: 32.6 mmHg (ref 32.0–48.0)
pH, Arterial: 7.422 (ref 7.350–7.450)
pO2, Arterial: 62.7 mmHg — ABNORMAL LOW (ref 83.0–108.0)

## 2018-05-14 LAB — CBC
HEMATOCRIT: 33 % — AB (ref 36.0–46.0)
Hemoglobin: 10.8 g/dL — ABNORMAL LOW (ref 12.0–15.0)
MCH: 32.5 pg (ref 26.0–34.0)
MCHC: 32.7 g/dL (ref 30.0–36.0)
MCV: 99.4 fL (ref 78.0–100.0)
Platelets: 156 10*3/uL (ref 150–400)
RBC: 3.32 MIL/uL — ABNORMAL LOW (ref 3.87–5.11)
RDW: 12.1 % (ref 11.5–15.5)
WBC: 13.5 10*3/uL — ABNORMAL HIGH (ref 4.0–10.5)

## 2018-05-14 LAB — GLUCOSE, CAPILLARY
GLUCOSE-CAPILLARY: 107 mg/dL — AB (ref 70–99)
GLUCOSE-CAPILLARY: 125 mg/dL — AB (ref 70–99)
Glucose-Capillary: 138 mg/dL — ABNORMAL HIGH (ref 70–99)
Glucose-Capillary: 98 mg/dL (ref 70–99)

## 2018-05-14 LAB — BASIC METABOLIC PANEL
Anion gap: 7 (ref 5–15)
BUN: 10 mg/dL (ref 8–23)
CALCIUM: 7.6 mg/dL — AB (ref 8.9–10.3)
CO2: 20 mmol/L — ABNORMAL LOW (ref 22–32)
Chloride: 110 mmol/L (ref 98–111)
Creatinine, Ser: 0.75 mg/dL (ref 0.44–1.00)
GLUCOSE: 101 mg/dL — AB (ref 70–99)
Potassium: 3.9 mmol/L (ref 3.5–5.1)
Sodium: 137 mmol/L (ref 135–145)

## 2018-05-14 MED ORDER — GUAIFENESIN ER 600 MG PO TB12
600.0000 mg | ORAL_TABLET | Freq: Two times a day (BID) | ORAL | Status: DC
  Administered 2018-05-14 (×2): 600 mg via ORAL
  Filled 2018-05-14 (×2): qty 1

## 2018-05-14 MED ORDER — METOPROLOL SUCCINATE ER 25 MG PO TB24
12.5000 mg | ORAL_TABLET | Freq: Every day | ORAL | Status: DC
  Administered 2018-05-14: 12.5 mg via ORAL
  Filled 2018-05-14: qty 1

## 2018-05-14 MED ORDER — FENTANYL CITRATE (PF) 100 MCG/2ML IJ SOLN
12.5000 ug | INTRAMUSCULAR | Status: DC | PRN
  Administered 2018-05-18: 12.5 ug via INTRAVENOUS
  Filled 2018-05-14 (×2): qty 2

## 2018-05-14 NOTE — Discharge Summary (Addendum)
Physician Discharge Summary       [address]       [place] [address]             [phone]    Patient ID: [name] MRN: [id number] DOB/AGE: 11-22-1940 77 y.o.  Admit date: 05/13/2018 Discharge date: 05/22/2018  Admission Diagnoses: Left upper lobe lung nodule, probable stage IA (T1, N0) bronchogenic carcinoma. Discharge Diagnoses:  1. Stage IA nonsmall-cell carcinoma, left upper lobe (T1, N0). 2. S/p left upper lobe pulmonary nodule 3. History of tobacco abuse 4. History of Hypertension 5. History of HLD (hyperlipidemia) 6. History of GERD (gastroesophageal reflux disease) 7. History of DD (diverticular disease) 8. History of Cardiac conduction disorder 9. History of skin cancer ([place]) 10. 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 Roxan Hockey on 05/13/2018.  Consultant: Cardiology  Pathology: 1. Lung, resection (segmental or lobe), Left upper - POORLY DIFFERENTIATED ADENOCARCINOMA INVADING CARTILAGE (BRONCHUS) - MARGINS UNINVOLVED BY INVASIVE CARCINOMA - LYMPHOVASCULAR SPACE INVASION PRESENT - EMPHYSEMATOUS CHANGES - SEE ONCOLOGY TABLE AND COMMENT BELOW 2. Lymph node, biopsy, Level 9 - NO CARCINOMA IDENTIFIED IN ONE LYMPH NODE (0/1) - SEE COMMENT 3. Lymph node, biopsy, Level 10 - NO CARCINOMA IDENTIFIED IN ONE LYMPH NODE (0/1) - SEE COMMENT 4. Lymph node, biopsy, Level 5 - NO CARCINOMA IDENTIFIED IN ONE LYMPH NODE (0/1) - SEE COMMENT 5. Lymph node, biopsy, Level 5 #2 - NO CARCINOMA IDENTIFIED IN ONE LYMPH NODE (0/1) - SEE COMMENT 6. Lymph node, biopsy, Level 6 - NO CARCINOMA IDENTIFIED IN ONE LYMPH NODE (0/1) - SEE COMMENT 7. Lymph node, biopsy, Level 7 - NO CARCINOMA IDENTIFIED IN ONE LYMPH NODE (0/1) - SEE COMMENT 8. Lymph node, biopsy,  Level 7 #2 - NO CARCINOMA IDENTIFIED IN ONE LYMPH NODE (0/1) - SEE COMMENT 9. Lymph node, biopsy, Level 10 #2 - NO CARCINOMA IDENTIFIED IN ONE LYMPH NODE (0/1) - SEE COMMENT TNM CODE: pT1c, pN0  History of Presenting Illness: [name] is a 77 year old woman with a history of tobacco abuse (1 pack/day for 60 years), emphysema, hypertension, hyperlipidemia, osteoarthritis status post bilateral knee replacements, gastroesophageal reflux, and a cardiac conduction disorder.  She had a CT of the chest and July 2018.  It showed an 11 x 7 mm groundglass nodule in the right apex.  There was a 9 x 5 mm mixed attenuation nodule in the left upper lobe as well as other scattered tiny nodules.  There was moderate centrilobular emphysema.  In July 2019 she underwent a CT of the chest which showed significant enlargement of the left upper lobe nodule which increased to 13 x 21 mm.  The right apical nodule was unchanged.  There also was some aortic and coronary atherosclerotic disease noted.  A PET/CT showed the left upper lobe nodule was intensely hypermetabolic with an SUV of 97.6.  The SUV in the right apical nodule was 1.1.  There is no evidence of regional or distant metastases.  She feels well.  She does not have any chest pain, pressure, or tightness either at rest or with exertion.  She does not get short of breath with exertion.  She does most of the work around her house.  She can walk up 2 flights of stairs without stopping.  Her appetite is good.  She denies weight loss.  She denies headaches  or visual changes.  She denies any new bone or joint pain.  Dr. Roxan Hockey recommended that we proceed with left VATS for left upper lobectomy.  Dr. Roxan Hockey discussed the general nature of the procedure, the need for general anesthesia, the incisions to be used and the use of a drainage tube with [name], [name] and their son. We discussed the expected hospital stay, overall recovery  and short and long term outcomes.  I informed him of the indications, risks, benefits, and alternatives.  They understand the risks, benefits, and possible complications and agree to proceed with surgery. Patient underwent left video-assisted thoracoscopy, left upper lobectomy, mediastinal lymph node dissection, intercostal nerve block.  Brief Hospital Course:  The patient remained afebrile and hemodynamically stable. A line and foley were removed early in the post operative course. Chest tube output gradually decreased. Chest tube did have an air leak that worsened with cough. Daily chest x rays were obtained and remained mostly stable (a small left apical pneumothorax, subcutaneous emphysema). Chest tube was placed to water seal on 09/11 as the air leak resolved. Chest x ray on 09/12 showed stable,small left apical pneumothorax and extensive subcutaneous emphysema, left lower lobe atelectasis or infiltrate, and COPD. Chest tube was removed on 05/21/2018. Patient then developed PAF. Cardiology was consulted. Rate was controlled with IV Lopresor and Toprol XL 50 mg daily (as take prior to surgery). As per cardiology note, patient wishes to avoid anticoagulation. Provided she has no further atrial fibrillation she will not require anticoagulation. Per Dr. Irish Lack, he will arrange for a 30 day monitor (to evaluate for a fib). She was in sinus rhythm in the 80's this am. Patient is ambulating on room air. Subcutaneous emphysema is slowly resolving and patient's voice is less nasal. Patient is tolerating a diet and has had a bowel movement. Wounds are clean and dry. Final chest X ray showed stable left lower lobe consolidation, slight decrease in left apical pneumothorax when compare with the prior exam.  Per Dr.Tanee Henery, patient is felt surgically stable for discharge today.  Latest Vital Signs: Blood pressure 130/68, pulse 85, temperature (!) 97.5 F (36.4 C), temperature source Oral, resp. rate 18, height  5' 2.5" (1.588 m), weight 64 kg, SpO2 99 %.  Physical Exam: Cardiovascular: RRR. Pulmonary: Clear to auscultation on right and coarse on the left. (secondary to subcutaneous emphysema left chest wall, arm, and neck) but slowly resolving Abdomen: Soft, non tender, bowel sounds present. Extremities: No LE edema Wounds: Clean and dry.     Discharge Condition: Stable and discharged to home.  Recent laboratory studies:  Lab Results  Component Value Date   WBC 10.3 05/21/2018   HGB 11.6 (L) 05/21/2018   HCT 34.8 (L) 05/21/2018   MCV 98.0 05/21/2018   PLT 247 05/21/2018   Lab Results  Component Value Date   NA 130 (L) 05/20/2018   K 4.2 05/20/2018   CL 96 (L) 05/20/2018   CO2 24 05/20/2018   CREATININE 0.75 05/20/2018   GLUCOSE 119 (H) 05/20/2018     Diagnostic Studies: Dg Chest 2 View  Result Date: 05/22/2018 CLINICAL DATA:  Follow-up pneumothorax with left chest tube removal yesterday EXAM: CHEST - 2 VIEW COMPARISON:  05/21/2018 FINDINGS: The small left apical pneumothorax is again identified but has decreased somewhat in size when compared with the prior exam. Extensive subcutaneous emphysema is again noted. Cardiac shadow is stable. Stable left basilar opacities are seen. No new focal infiltrate is noted. No bony abnormality is  seen. IMPRESSION: Stable left lower lobe consolidation. Slight decrease in left apical pneumothorax when compare with the prior exam. Electronically Signed   By: Inez Catalina M.D.   On: 05/22/2018 07:37   Dg Chest 2 View  Result Date: 05/13/2018 CLINICAL DATA:  Left upper lobe nodule preop lobectomy EXAM: CHEST - 2 VIEW COMPARISON:  PET-CT 04/24/2018 FINDINGS: COPD with pulmonary hyperinflation. Left upper lobe nodule is unchanged from prior CT. No adenopathy. Right apical nodule difficult to see on chest x-ray due to small size. Negative for heart failure or pneumonia.  No pleural effusion IMPRESSION: COPD Left upper lobe nodule unchanged from recent PET-CT.  Electronically Signed   By: Franchot Gallo M.D.   On: 05/13/2018 10:40   Nm Pet Image Initial (pi) Skull Base To Thigh  Result Date: 04/24/2018 CLINICAL DATA:  Initial treatment strategy for bilateral upper lobe pulmonary nodules. EXAM: NUCLEAR MEDICINE PET SKULL BASE TO THIGH TECHNIQUE: 6.9 mCi F-18 FDG was injected intravenously. Full-ring PET imaging was performed from the skull base to thigh after the radiotracer. CT data was obtained and used for attenuation correction and anatomic localization. Fasting blood glucose: 113 mg/dl COMPARISON:  04/06/2018 FINDINGS: (Reference/background mediastinal blood pool activity: SUV max = 2.4) NECK:  No hypermetabolic lymph nodes or masses. Incidental CT findings:  None. CHEST: 2.2 cm spiculated nodule in central left upper lobe shows intense hypermetabolic activity with SUV max of 14.7. 8 mm pulmonary nodule in the right lung apex on image 11/8 shows mild FDG uptake with SUV max of 1.1. No new or enlarging pulmonary nodules or masses identified. Incidental CT findings:  Mild emphysema. ABDOMEN/PELVIS: No abnormal hypermetabolic activity within the liver, pancreas, adrenal glands, or spleen. No hypermetabolic lymph nodes in the abdomen or pelvis. Incidental CT findings: Small hiatal hernia. Prior hysterectomy noted. Adnexal regions are unremarkable in appearance. Diverticulosis mainly involves the sigmoid colon, without evidence of diverticulitis aortic atherosclerosis. SKELETON: No focal hypermetabolic bone lesions to suggest skeletal metastasis. Incidental CT findings:  None. IMPRESSION: Hypermetabolic 2.2 cm central left upper lobe spiculated nodule, consistent with primary bronchogenic carcinoma. 8 mm nodule in the right lung apex shows minimal FDG activity, however low-grade bronchogenic carcinoma cannot be excluded due to the small size of this nodule. Recommend continued follow-up by chest CT in 6 months. No evidence of thoracic lymph node metastases or distant  metastatic disease. Mild emphysema. Electronically Signed   By: Earle Gell M.D.   On: 04/24/2018 16:07   Dg Bone Density (dxa)  Result Date: 05/04/2018 EXAM: DUAL X-RAY ABSORPTIOMETRY (DXA) FOR BONE MINERAL DENSITY IMPRESSION: Referring Physician:  Roe Coombs Your patient completed a BMD test using Lunar IDXA DXA system ( analysis version: 16 ) manufactured by [employer]. PATIENT: Name: DARLINE, FAITH Patient ID: [id number] Birth Date: Dec 02, 1940 Height:     62.0 in.     Weight:     137.2 lbs. Sex: Female Measured: 05/04/2018 Indications: Advanced Age, Bilateral Ovariectomy (65.51), Caucasian, Estrogen Deficient, Family History of Osteoporosis, History of Osteopenia, Hx of tobacco use, Hysterectomy, Postmenopausal Fractures: None Treatments: Calcium (E943.0), Vitamin D (E933.5) ASSESSMENT: The BMD measured at Femur Neck Right is 0.769 g/cm2 with a T-score of -1.9. This patient is considered OSTEOPENIC according to [facility]) criteria. There has been no statistically significant change in BMD of Left hip since prior exam dated 05/11/2015. The scan quality is good. DXA exam performed on prior Hologic device measured only unilateral hip (Total Mean was not measured) and therefore current  and prior Total Mean cannot be compared. Site Region Measured Date Measured Age YA T-score BMD Significant CHANGE DualFemur Neck Right 05/04/2018    78.2         -1.9    0.769 g/cm2 AP Spine L1-L4 05/04/2018 78.2 -0.5 1.121 g/cm2 * AP Spine  L1-L4      05/11/2015    75.2         -1.1    1.049 g/cm2 DualFemur Neck Left  05/04/2018    78.2         -1.8    0.786 g/cm2 DualFemur Neck Left  05/11/2015    75.2         -1.6    0.815 g/cm2 World Health Organization [facility]) criteria for post-menopausal, Caucasian Women: Normal       T-score at or above -1 SD Osteopenia   T-score between -1 and -2.5 SD Osteoporosis T-score at or below -2.5 SD RECOMMENDATION: 1. All patients should optimize calcium and vitamin  D intake. 2. Consider FDA approved medical therapies in postmenopausal women and men aged 96 years and older, based on the following: a. A hip or vertebral (clinical or morphometric) fracture b. T- score < or = -2.5 at the femoral neck or spine after appropriate evaluation to exclude secondary causes c. Low bone mass (T-score between -1.0 and -2.5 at the femoral neck or spine) and a 10 year probability of a hip fracture > or = 3% or a 10 year probability of a major osteoporosis-related fracture > or = 20% based on the [place]-adapted WHO algorithm d. Clinician judgment and/or patient preferences may indicate treatment for people with 10-year fracture probabilities above or below these levels FOLLOW-UP: People with diagnosed cases of osteoporosis or at high risk for fracture should have regular bone mineral density tests. For patients eligible for Medicare, routine testing is allowed once every 2 years. The testing frequency can be increased to one year for patients who have rapidly progressing disease, those who are receiving or discontinuing medical therapy to restore bone mass, or have additional risk factors. I have reviewed this report and agree with the above findings. Mark A. Thornton Papas, M.D. [facility] FRAX* 10-year Probability of Fracture Based on femoral neck BMD: DualFemur (Right) Major Osteoporotic Fracture: 15.0% Hip Fracture:                4.2% Population:                  [place] (Caucasian) Risk Factors:                None *FRAX is a Materials engineer of the [employer] of [employer] for Metabolic Bone Disease, a World Pharmacologist (WHO) [facility]. ASSESSMENT: The probability of a major osteoporotic fracture is 15.0 % within the next ten years. The probability of hip fracture is  4.2  % within the next 10 years. I have reviewed this report and agree with the above findings. Mark A. Thornton Papas, M.D. [facility] Electronically Signed   By: Lavonia Dana M.D.   On:  05/04/2018 10:33   Mm 3d Screen Breast Bilateral  Result Date: 05/04/2018 CLINICAL DATA:  Screening. EXAM: DIGITAL SCREENING BILATERAL MAMMOGRAM WITH TOMO AND CAD COMPARISON:  Previous exam(s). ACR Breast Density Category b: There are scattered areas of fibroglandular density. FINDINGS: There are no findings suspicious for malignancy. Images were processed with CAD. IMPRESSION: No mammographic evidence of malignancy. A result letter of this screening mammogram will be mailed  directly to the patient. RECOMMENDATION: Screening mammogram in one year. (Code:SM-B-01Y) BI-RADS CATEGORY  1: Negative. Electronically Signed   By: Lillia Mountain M.D.   On: 05/04/2018 16:48     Discharge Medications: Allergies as of 05/22/2018      Reactions   Sulfa Antibiotics Other (See Comments)   UNSPECIFIED, childhood reaction   Codeine Hives, Rash   Hydrocodone Other (See Comments)   FATIGUE   Meperidine Nausea And Vomiting      Medication List    TAKE these medications   aspirin EC 81 MG tablet Take 81 mg by mouth every other day.   diphenhydramine-acetaminophen 25-500 MG Tabs tablet Commonly known as:  TYLENOL PM Take 2 tablets by mouth at bedtime as needed (for sleep).   guaiFENesin 600 MG 12 hr tablet Commonly known as:  MUCINEX Take 2 tablets (1,200 mg total) by mouth 2 (two) times daily as needed for cough or to loosen phlegm.   metoprolol succinate 50 MG 24 hr tablet Commonly known as:  TOPROL-XL Take 50 mg by mouth daily. Take with or immediately following a meal.   MULTIVITAMIN PO Take 1 tablet by mouth daily.   polyethylene glycol packet Commonly known as:  MIRALAX / GLYCOLAX Take 8.5 g by mouth every other day.   simvastatin 20 MG tablet Commonly known as:  ZOCOR Take 20 mg by mouth daily.   traMADol 50 MG tablet Commonly known as:  ULTRAM Take 50 mg by mouth every 4-6 hours PRN severe pain       Follow Up Appointments: Follow-up Information    Melrose Nakayama, MD. Go  on 06/09/2018.   Specialty:  Cardiothoracic Surgery Why:  PA/LAT CXR to be taken (at [place] which is in the same building as Dr. Leonarda Salon office) on 06/09/2018 at 9:00 am;Appointment time is at 9:30 am Contact information: [address] [place] [address] [phone]        Nurse. Go to.   Why:  Appointment is with nurse only to have chest tube sutures removed. Appointment time is at 10:00 am Contact information: [address] [place] [address]       Jettie Booze, MD. Call.   Specialties:  Cardiology, Radiology, Interventional Cardiology Why:  for a follow up appointment (as monitor to be arranged to evaluate for further a fib) Contact information: [address] [place] [address] [phone]           Signed: Sharalyn Ink [facility] 05/22/2018, 9:33 AM

## 2018-05-14 NOTE — Discharge Instructions (Signed)
Thoracoscopy, Care After °Refer to this sheet in the next few weeks. These instructions provide you with information about caring for yourself after your procedure. Your health care provider may also give you more specific instructions. Your treatment has been planned according to current medical practices, but problems sometimes occur. Call your health care provider if you have any problems or questions after your procedure. °What can I expect after the procedure? °After your procedure, it is common to feel sore for up to two weeks. °Follow these instructions at home: °· There are many different ways to close and cover an incision, including stitches (sutures), skin glue, and adhesive strips. Follow your health care provider's instructions about: °? Incision care. °? Bandage (dressing) changes and removal. °? Incision closure removal. °· Check your incision area every day for signs of infection. Watch for: °? Redness, swelling, or pain. °? Fluid, blood, or pus. °· Take medicines only as directed by your health care provider. °· Try to cough often. Coughing helps to protect against lung infection (pneumonia). It may hurt to cough. If this happens, hold a pillow against your chest when you cough. °· Take deep breaths. This also helps to protect against pneumonia. °· If you were given an incentive spirometer, use it as directed by your health care provider. °· Do not take baths, swim, or use a hot tub until your health care provider approves. You may take showers. °· Avoid lifting until your health care provider approves. °· Avoid driving until your health care provider approves. °· Do not travel by airplane after the chest tube is removed until your health care provider approves. °Contact a health care provider if: °· You have a fever. °· Pain medicines do not ease your pain. °· You have redness, swelling, or increasing pain in your incision area. °· You develop a cough that does not go away, or you are coughing up  mucus that is yellow or green. °Get help right away if: °· You have fluid, blood, or pus coming from your incision. °· There is a bad smell coming from your incision or dressing. °· You develop a rash. °· You have difficulty breathing. °· You cough up blood. °· You develop light-headedness or you feel faint. °· You develop chest pain. °· Your heartbeat feels irregular or very fast. °This information is not intended to replace advice given to you by your health care provider. Make sure you discuss any questions you have with your health care provider. °Document Released: 03/15/2005 Document Revised: 04/28/2016 Document Reviewed: 05/11/2014 °Elsevier Interactive Patient Education © 2018 Elsevier Inc. ° °

## 2018-05-14 NOTE — Progress Notes (Signed)
A line and foley cath removed per order. Pt educated on temporary restrictions.  [place]  Maranatha Grossi, RN

## 2018-05-14 NOTE — Progress Notes (Signed)
Fent PCA stopped;  47mL Fentanyl wasted with [place], RN as witness.   [place]  Kymberlie Brazeau, RN

## 2018-05-14 NOTE — Progress Notes (Addendum)
      [address]       [place] [address]             [phone]       1 Day Post-Op Procedure(s) (LRB): VIDEO ASSISTED THORACOSCOPY (VATS)/LEFT UPPER LOBECTOMY (Left)  Subjective: Patient with cough. She states she is not using PCA pump much because she has no pain.  Objective: Vital signs in last 24 hours: Temp:  [97.3 F (36.3 C)-98.3 F (36.8 C)] 97.6 F (36.4 C) (09/05 0410) Pulse Rate:  [53-72] 72 (09/05 0410) Cardiac Rhythm: Sinus bradycardia (09/05 0700) Resp:  [10-24] 24 (09/05 0415) BP: (82-114)/(43-73) 106/73 (09/05 0410) SpO2:  [96 %-100 %] 96 % (09/05 0415) Arterial Line BP: (94-162)/(48-106) 162/106 (09/05 0410) Weight:  [64 kg] 64 kg (09/04 1547)      Intake/Output from previous day: 09/04 0701 - 09/05 0700 In: 2633.9 [P.O.:770; I.V.:1413.9; IV Piggyback:450] Out: 1881 [Urine:1325; Blood:300; Chest Tube:256]   Physical Exam:  Cardiovascular: RRR. Pulmonary: Clear to auscultation on right and coarse on the left. Abdomen: Soft, non tender, bowel sounds present. Extremities: SCDs in place. Wounds: Dressing is clean and dry.  . Chest Tube: to suction, air leak that worsens with cough  Lab Results: CBC: Recent Labs    05/13/18 1115 05/14/18 0536  WBC  --  13.5*  HGB 11.9* 10.8*  HCT 35.0* 33.0*  PLT  --  156   BMET:  Recent Labs    05/13/18 1115 05/14/18 0358  NA 138 137  K 4.1 3.9  CL  --  110  CO2  --  20*  GLUCOSE  --  101*  BUN  --  10  CREATININE  --  0.75  CALCIUM  --  7.6*    PT/INR: No results for input(s): LABPROT, INR in the last 72 hours. ABG:  INR: Will add last result for INR, ABG once components are confirmed Will add last 4 CBG results once components are confirmed  Assessment/Plan:  1. CV - Mostly SR, occasional SB. Will restart low dose Toprol Xl with parameters. 2.  Pulmonary - Chest tube is to suction. 256 cc of output since surgery. There is an air leak that worsens with cough. CXR this  am to be taken. Chest tube to suction for now. Mucinex for cough. Encourage incentive spirometer. Await final pathology. 3. Remove PCA, a line, foley 4. Decrease IVF  Donielle M ZimmermanPA-C 05/14/2018,7:41 AM [phone]  Patient seen and examined, agree with above + air leak, CXR shows small PTX/ space and minimal SQ emphysema Overall doing well  Remo Lipps C. Roxan Hockey, MD [facility] [phone]

## 2018-05-15 ENCOUNTER — Inpatient Hospital Stay (HOSPITAL_COMMUNITY): Payer: Medicare Other

## 2018-05-15 LAB — COMPREHENSIVE METABOLIC PANEL
ALK PHOS: 48 U/L (ref 38–126)
ALT: 9 U/L (ref 0–44)
AST: 22 U/L (ref 15–41)
Albumin: 3.2 g/dL — ABNORMAL LOW (ref 3.5–5.0)
Anion gap: 10 (ref 5–15)
BUN: 8 mg/dL (ref 8–23)
CALCIUM: 8.5 mg/dL — AB (ref 8.9–10.3)
CO2: 22 mmol/L (ref 22–32)
CREATININE: 0.78 mg/dL (ref 0.44–1.00)
Chloride: 105 mmol/L (ref 98–111)
GFR calc non Af Amer: >60 mL/min (ref >60)
Glucose, Bld: 123 mg/dL — ABNORMAL HIGH (ref 70–99)
Potassium: 3.9 mmol/L (ref 3.5–5.1)
Sodium: 137 mmol/L (ref 135–145)
Total Bilirubin: 0.9 mg/dL (ref 0.3–1.2)
Total Protein: 5.7 g/dL — ABNORMAL LOW (ref 6.5–8.1)

## 2018-05-15 LAB — CBC
HEMATOCRIT: 35.4 % — AB (ref 36.0–46.0)
HEMOGLOBIN: 11.7 g/dL — AB (ref 12.0–15.0)
MCH: 32.9 pg (ref 26.0–34.0)
MCHC: 33.1 g/dL (ref 30.0–36.0)
MCV: 99.4 fL (ref 78.0–100.0)
Platelets: 162 10*3/uL (ref 150–400)
RBC: 3.56 MIL/uL — AB (ref 3.87–5.11)
RDW: 12.2 % (ref 11.5–15.5)
WBC: 13.5 10*3/uL — ABNORMAL HIGH (ref 4.0–10.5)

## 2018-05-15 MED ORDER — POTASSIUM CHLORIDE CRYS ER 20 MEQ PO TBCR
20.0000 meq | EXTENDED_RELEASE_TABLET | Freq: Once | ORAL | Status: AC
  Administered 2018-05-15: 20 meq via ORAL
  Filled 2018-05-15: qty 1

## 2018-05-15 MED ORDER — METOPROLOL SUCCINATE ER 25 MG PO TB24
25.0000 mg | ORAL_TABLET | Freq: Every day | ORAL | Status: DC
  Administered 2018-05-15: 25 mg via ORAL
  Filled 2018-05-15 (×2): qty 1

## 2018-05-15 MED ORDER — KETOROLAC TROMETHAMINE 30 MG/ML IJ SOLN
15.0000 mg | Freq: Four times a day (QID) | INTRAMUSCULAR | Status: AC
  Administered 2018-05-15 (×2): 15 mg via INTRAVENOUS
  Filled 2018-05-15 (×2): qty 1

## 2018-05-15 MED ORDER — GUAIFENESIN ER 600 MG PO TB12
1200.0000 mg | ORAL_TABLET | Freq: Two times a day (BID) | ORAL | Status: DC
  Administered 2018-05-15 – 2018-05-22 (×15): 1200 mg via ORAL
  Filled 2018-05-15 (×16): qty 2

## 2018-05-15 NOTE — Progress Notes (Addendum)
      [address]       [place] [address]             [phone]       2 Days Post-Op Procedure(s) (LRB): VIDEO ASSISTED THORACOSCOPY (VATS)/LEFT UPPER LOBECTOMY (Left)  Subjective: Patient still with cough, pain with movement, and voice "sounds nasally"  Objective: Vital signs in last 24 hours: Temp:  [97.6 F (36.4 C)-98.7 F (37.1 C)] 98.2 F (36.8 C) (09/06 0412) Pulse Rate:  [66-77] 69 (09/06 0412) Cardiac Rhythm: Normal sinus rhythm (09/06 0400) Resp:  [20-41] 24 (09/06 0412) BP: (105-151)/(61-77) 145/75 (09/06 0412) SpO2:  [93 %-97 %] 95 % (09/06 0412)      Intake/Output from previous day: 09/05 0701 - 09/06 0700 In: 9678 [P.O.:960; I.V.:625] Out: 3527 [LFYBO:1751; Stool:1; Chest Tube:400]   Physical Exam:  Cardiovascular: RRR. Pulmonary: Clear to auscultation on right and coarse on the left. Subcutaneous emphysema left chest wall, arm, and neck Abdomen: Soft, non tender, bowel sounds present. Extremities: SCDs in place. Wounds: Dressing is clean and dry.   Chest Tube: to suction, + 1 air leak that slightly worsens with cough  Lab Results: CBC: Recent Labs    05/14/18 0536 05/15/18 0400  WBC 13.5* 13.5*  HGB 10.8* 11.7*  HCT 33.0* 35.4*  PLT 156 162   BMET:  Recent Labs    05/14/18 0358 05/15/18 0400  NA 137 137  K 3.9 3.9  CL 110 105  CO2 20* 22  GLUCOSE 101* 123*  BUN 10 8  CREATININE 0.75 0.78  CALCIUM 7.6* 8.5*    PT/INR: No results for input(s): LABPROT, INR in the last 72 hours. ABG:  INR: Will add last result for INR, ABG once components are confirmed Will add last 4 CBG results once components are confirmed  Assessment/Plan:  1. CV - Mostly SR, occasional SB. Will increase Toprol XL to 25 mg daily 2.  Pulmonary - Chest tube is to suction;400 cc of output since surgery. There is an air leak that worsens with cough. CXR this am appears to show patient rotated to the right, small left apical pneumothorax,  subcutaneous emphysema left chest wall and neck. Hope to place chest tube to water seal soon . Will increase Mucinex to help with cough. Encourage incentive spirometer. Await final pathology. 3. Anemia-H and H increased to 11.7 and 35.4 this am 4. Supplement potassium 5. Will give 2 doses of Toradol to help with pain  Donielle M ZimmermanPA-C 05/15/2018,7:22 AM [phone] Increased SQ air on CXR this AM, Ct in good position, suspect tubing kinked at some point overnight.  Will keep on suction today Less pain and tolerating POs Continue ambulation Path pending  Remo Lipps C. Roxan Hockey, MD [facility] [phone]

## 2018-05-16 ENCOUNTER — Inpatient Hospital Stay (HOSPITAL_COMMUNITY): Payer: Medicare Other

## 2018-05-16 MED ORDER — METOPROLOL SUCCINATE ER 50 MG PO TB24
50.0000 mg | ORAL_TABLET | Freq: Every day | ORAL | Status: DC
  Administered 2018-05-16 – 2018-05-22 (×7): 50 mg via ORAL
  Filled 2018-05-16 (×7): qty 1

## 2018-05-16 NOTE — Progress Notes (Addendum)
Low blood sugar reported. 33mg /dl, patient alert , appears a bit sleepy but obeys commands, protocol initiated to be rechecked.- WRONG entry CNugent.

## 2018-05-16 NOTE — Progress Notes (Addendum)
      [address]       [place] [address]             [phone]       3 Days Post-Op Procedure(s) (LRB): VIDEO ASSISTED THORACOSCOPY (VATS)/LEFT UPPER LOBECTOMY (Left)  Subjective: Patient still with cough and voice still "sounds nasally"  Objective: Vital signs in last 24 hours: Temp:  [97.5 F (36.4 C)-98.4 F (36.9 C)] 97.5 F (36.4 C) (09/07 0749) Pulse Rate:  [66-87] 66 (09/07 0749) Cardiac Rhythm: Normal sinus rhythm (09/07 0703) Resp:  [19-71] 19 (09/07 0749) BP: (134-153)/(73-95) 153/95 (09/07 0749) SpO2:  [95 %-99 %] 97 % (09/07 0749)      Intake/Output from previous day: 09/06 0701 - 09/07 0700 In: 1340 [P.O.:1080; I.V.:260] Out: 3235 [Urine:1500; Chest Tube:150]   Physical Exam:  Cardiovascular: RRR. Pulmonary: Clear to auscultation on right and coarse on the left. Subcutaneous emphysema left chest wall, arm, and neck Abdomen: Soft, non tender, bowel sounds present. Extremities: SCDs in place. Wounds:Clean and dry.   Chest Tube: to suction, + 1 air leak that slightly worsens with cough  Lab Results: CBC: Recent Labs    05/14/18 0536 05/15/18 0400  WBC 13.5* 13.5*  HGB 10.8* 11.7*  HCT 33.0* 35.4*  PLT 156 162   BMET:  Recent Labs    05/14/18 0358 05/15/18 0400  NA 137 137  K 3.9 3.9  CL 110 105  CO2 20* 22  GLUCOSE 101* 123*  BUN 10 8  CREATININE 0.75 0.78  CALCIUM 7.6* 8.5*    PT/INR: No results for input(s): LABPROT, INR in the last 72 hours. ABG:  INR: Will add last result for INR, ABG once components are confirmed Will add last 4 CBG results once components are confirmed  Assessment/Plan:  1. CV - Mostly SR, occasional SB. Will increase Toprol XL to 50 mg daily, as taken prior to surgery. 2.  Pulmonary - Chest tube is to suction;150 cc of output since surgery. There is a persistent, small air leak. CXR this am appears to show patient rotated to the right, stable/small left apical pneumothorax,  subcutaneous emphysema left chest wall and neck and right chest wall. . Mucinex to help with cough. Encourage incentive spirometer. 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 Dr. Roxan Hockey already discussed with patient. 3. Anemia-H and H increased to 11.7 and 35.4 this am 4. Remove central line  Donielle M ZimmermanPA-C 05/16/2018,9:18 AM [phone]   Chart reviewed, patient examined, agree with above. Small intermittent air leak. CXR shows small apical ptx. Continue to suction.

## 2018-05-16 NOTE — Progress Notes (Signed)
Patient had removal of central line done, under aseptic conditions. tolerated well, will monitor for bleeding , patient educated.

## 2018-05-16 NOTE — Progress Notes (Signed)
Spoke with RN re d/c CVC order.  RN aware.

## 2018-05-17 ENCOUNTER — Inpatient Hospital Stay (HOSPITAL_COMMUNITY): Payer: Medicare Other

## 2018-05-17 MED ORDER — KETOROLAC TROMETHAMINE 30 MG/ML IJ SOLN
15.0000 mg | Freq: Four times a day (QID) | INTRAMUSCULAR | Status: AC
  Administered 2018-05-17 (×2): 15 mg via INTRAVENOUS
  Filled 2018-05-17 (×2): qty 1

## 2018-05-17 NOTE — Progress Notes (Addendum)
      [address]       [place] [address]             [phone]       4 Days Post-Op Procedure(s) (LRB): VIDEO ASSISTED THORACOSCOPY (VATS)/LEFT UPPER LOBECTOMY (Left)  Subjective: Patient still with cough and voice still "sounds nasally". She has pain on left side from coughing.  Objective: Vital signs in last 24 hours: Temp:  [97.6 F (36.4 C)-98 F (36.7 C)] 97.6 F (36.4 C) (09/08 0300) Pulse Rate:  [68-100] 79 (09/08 0300) Cardiac Rhythm: Normal sinus rhythm (09/08 0711) Resp:  [18-28] 25 (09/08 0300) BP: (100-153)/(57-87) 145/79 (09/08 0300) SpO2:  [96 %-99 %] 97 % (09/08 0300)      Intake/Output from previous day: 09/07 0701 - 09/08 0700 In: 581.1 [P.O.:360; I.V.:221.1] Out: 130 [Chest Tube:130]   Physical Exam:  Cardiovascular: RRR. Pulmonary: Clear to auscultation on right and coarse on the left. Subcutaneous emphysema left chest wall, arm, and neck Abdomen: Soft, non tender, bowel sounds present. Extremities: SCDs in place. Wounds:Clean and dry.   Chest Tube: to suction, + 1 air leak that slightly worsens with cough  Lab Results: CBC: Recent Labs    05/15/18 0400  WBC 13.5*  HGB 11.7*  HCT 35.4*  PLT 162   BMET:  Recent Labs    05/15/18 0400  NA 137  K 3.9  CL 105  CO2 22  GLUCOSE 123*  BUN 8  CREATININE 0.78  CALCIUM 8.5*    PT/INR: No results for input(s): LABPROT, INR in the last 72 hours. ABG:  INR: Will add last result for INR, ABG once components are confirmed Will add last 4 CBG results once components are confirmed  Assessment/Plan:  1. CV - Mostly SR, occasional SB. On Toprol XL to 50 mg daily, as taken prior to surgery. 2.  Pulmonary - Chest tube is to suction and 130 recorded for last 24 hours. There is a persistent, small air leak. CXR this am appears to show patient rotated to the right, stable/small left apical pneumothorax, subcutaneous emphysema left chest wall and neck and right chest wall.  Continue Mucinex to help with cough. Encourage incentive spirometer.  3. Anemia-Last H and H increased to 11.7 and 35.4 4. Toradol to help with pain 5. Per patient request, will change diet to a regular diet  Sharalyn Ink [facility] 05/17/2018,8:28 AM [phone]    Chart reviewed, patient examined, agree with above. CXR shows tiny left apical ptx and there is still a small persistent air leak. Continue to suction.

## 2018-05-18 ENCOUNTER — Inpatient Hospital Stay (HOSPITAL_COMMUNITY): Payer: Medicare Other

## 2018-05-18 NOTE — Progress Notes (Addendum)
      [address]       [place] [address]             [phone]      5 Days Post-Op Procedure(s) (LRB): VIDEO ASSISTED THORACOSCOPY (VATS)/LEFT UPPER LOBECTOMY (Left)   Subjective:  No new complaints.  She continues to feel "nasally."  She denies shortness of breath and she does not feel like her throat is swollen.  Objective: Vital signs in last 24 hours: Temp:  [97.6 F (36.4 C)-98 F (36.7 C)] 97.6 F (36.4 C) (09/09 0753) Pulse Rate:  [71-87] 82 (09/09 0300) Cardiac Rhythm: Sinus tachycardia (09/09 0702) Resp:  [16-22] 16 (09/09 0753) BP: (102-151)/(55-73) 151/73 (09/09 0300) SpO2:  [95 %-100 %] 100 % (09/09 0753)  Intake/Output from previous day: 09/08 0701 - 09/09 0700 In: 38 [I.V.:38] Out: 53 [Urine:2; Stool:1; Chest Tube:50]  General appearance: alert, cooperative and no distress Heart: regular rate and rhythm Lungs: CTA right, course on left, extensive sub q air along left side Abdomen: soft, non-tender; bowel sounds normal; no masses,  no organomegaly Extremities: extremities normal, atraumatic, no cyanosis or edema Wound: clean and dry  Lab Results: No results for input(s): WBC, HGB, HCT, PLT in the last 72 hours. BMET: No results for input(s): NA, K, CL, CO2, GLUCOSE, BUN, CREATININE, CALCIUM in the last 72 hours.  PT/INR: No results for input(s): LABPROT, INR in the last 72 hours. ABG    Component Value Date/Time   PHART 7.422 05/14/2018 0445   HCO3 21.0 05/14/2018 0445   TCO2 28 05/13/2018 1115   ACIDBASEDEF 2.8 (H) 05/14/2018 0445   O2SAT 94.0 05/14/2018 0445   CBG (last 3)  No results for input(s): GLUCAP in the last 72 hours.  Assessment/Plan: S/P Procedure(s) (LRB): VIDEO ASSISTED THORACOSCOPY (VATS)/LEFT UPPER LOBECTOMY (Left)  1. Cv- NSR, on Toprol XL 2. Pulm- chest tube with continued air leak, apical pneumothorax remains stable, extensive sub q air remains unchanged, leave chest tube on suction today 3. DVT  prophylaxis, continue Lovenox 4. Dispo- patient stable, leave chest tube on suction today, repeat CXR in AM, continue current care   LOS: 5 days    Ellwood Handler 05/18/2018 Small but persistent air leak, agree with above PATH- T1,N0 stage IA  Revonda Standard. Roxan Hockey, MD [facility] [phone]

## 2018-05-18 NOTE — Progress Notes (Signed)
Pt alert and oriented x4, no complaints of pain or discomfort.  Bed in low position, call bell within reach.  Bed alarms on and functioning.    Will continue to monitor and do hourly rounding throughout the shift

## 2018-05-19 ENCOUNTER — Inpatient Hospital Stay (HOSPITAL_COMMUNITY): Payer: Medicare Other

## 2018-05-19 LAB — GLUCOSE, CAPILLARY: Glucose-Capillary: 121 mg/dL — ABNORMAL HIGH (ref 70–99)

## 2018-05-19 MED ORDER — ZOLPIDEM TARTRATE 5 MG PO TABS
5.0000 mg | ORAL_TABLET | Freq: Every evening | ORAL | Status: DC | PRN
  Administered 2018-05-19: 5 mg via ORAL
  Filled 2018-05-19: qty 1

## 2018-05-19 MED ORDER — KETOROLAC TROMETHAMINE 15 MG/ML IJ SOLN
15.0000 mg | Freq: Four times a day (QID) | INTRAMUSCULAR | Status: AC
  Administered 2018-05-19 (×2): 15 mg via INTRAVENOUS
  Filled 2018-05-19 (×2): qty 1

## 2018-05-19 NOTE — Progress Notes (Addendum)
      [address]       [place] [address]             [phone]       6 Days Post-Op Procedure(s) (LRB): VIDEO ASSISTED THORACOSCOPY (VATS)/LEFT UPPER LOBECTOMY (Left)  Subjective: Patient has pain under left breast.  Objective: Vital signs in last 24 hours: Temp:  [97.6 F (36.4 C)-98.1 F (36.7 C)] 97.7 F (36.5 C) (09/09 2342) Pulse Rate:  [73-89] 79 (09/09 2015) Cardiac Rhythm: Normal sinus rhythm (09/10 0356) Resp:  [16-25] 22 (09/09 1605) BP: (130-142)/(71-104) 130/71 (09/09 2015) SpO2:  [96 %-100 %] 97 % (09/09 2015)     Intake/Output from previous day: 09/09 0701 - 09/10 0700 In: -  Out: 210 [Chest Tube:210]   Physical Exam:  Cardiovascular: RRR. Pulmonary: Clear to auscultation on right and coarse on the left. Subcutaneous emphysema left chest wall, arm, and neck Abdomen: Soft, non tender, bowel sounds present. Extremities: SCDs in place. Wounds:Clean and dry.   Chest Tube: to suction,  Intermittent, small air leak with cough  Lab Results: CBC: No results for input(s): WBC, HGB, HCT, PLT in the last 72 hours. BMET:  No results for input(s): NA, K, CL, CO2, GLUCOSE, BUN, CREATININE, CALCIUM in the last 72 hours.  PT/INR: No results for input(s): LABPROT, INR in the last 72 hours. ABG:  INR: Will add last result for INR, ABG once components are confirmed Will add last 4 CBG results once components are confirmed  Assessment/Plan:  1. CV - Mostly SR, occasional SB. On Toprol XL to 50 mg daily, as taken prior to surgery. 2.  Pulmonary - Chest tube is to suction and 210 recorded for last 24 hours. There is an ntermittent, small air leak with cough. CXR this am appears to show  stable/small left apical pneumothorax, subcutaneous emphysema left chest wall and neck and right chest wall. Continue Mucinex to help with cough. Encourage incentive spirometer.  3. Anemia-Last H and H increased to 11.7 and 35.4 4. 2 more dose of Toradol to  help with pain   Donielle M ZimmermanPA-C 05/19/2018,7:50 AM [phone]   patient seen and examined, agree with above Small air leak persists  Remo Lipps C. Roxan Hockey, MD [facility] [phone]

## 2018-05-20 ENCOUNTER — Inpatient Hospital Stay (HOSPITAL_COMMUNITY): Payer: Medicare Other

## 2018-05-20 ENCOUNTER — Encounter (HOSPITAL_COMMUNITY): Payer: Self-pay | Admitting: Cardiology

## 2018-05-20 DIAGNOSIS — R911 Solitary pulmonary nodule: Secondary | ICD-10-CM

## 2018-05-20 DIAGNOSIS — Z720 Tobacco use: Secondary | ICD-10-CM

## 2018-05-20 DIAGNOSIS — I2584 Coronary atherosclerosis due to calcified coronary lesion: Secondary | ICD-10-CM

## 2018-05-20 DIAGNOSIS — I2510 Atherosclerotic heart disease of native coronary artery without angina pectoris: Secondary | ICD-10-CM

## 2018-05-20 DIAGNOSIS — I480 Paroxysmal atrial fibrillation: Secondary | ICD-10-CM

## 2018-05-20 DIAGNOSIS — I4891 Unspecified atrial fibrillation: Secondary | ICD-10-CM

## 2018-05-20 LAB — BASIC METABOLIC PANEL
Anion gap: 10 (ref 5–15)
BUN: 12 mg/dL (ref 8–23)
CHLORIDE: 96 mmol/L — AB (ref 98–111)
CO2: 24 mmol/L (ref 22–32)
Calcium: 8.8 mg/dL — ABNORMAL LOW (ref 8.9–10.3)
Creatinine, Ser: 0.75 mg/dL (ref 0.44–1.00)
GFR calc Af Amer: >60 mL/min (ref >60)
GFR calc non Af Amer: >60 mL/min (ref >60)
GLUCOSE: 119 mg/dL — AB (ref 70–99)
POTASSIUM: 4.2 mmol/L (ref 3.5–5.1)
Sodium: 130 mmol/L — ABNORMAL LOW (ref 135–145)

## 2018-05-20 LAB — ECHOCARDIOGRAM COMPLETE
HEIGHTINCHES: 62.5 in
WEIGHTICAEL: 2257.51 [oz_av]

## 2018-05-20 LAB — MAGNESIUM: Magnesium: 2 mg/dL (ref 1.7–2.4)

## 2018-05-20 MED ORDER — METOPROLOL TARTRATE 5 MG/5ML IV SOLN
2.5000 mg | Freq: Once | INTRAVENOUS | Status: AC
  Administered 2018-05-20: 2.5 mg via INTRAVENOUS

## 2018-05-20 MED ORDER — METOPROLOL TARTRATE 5 MG/5ML IV SOLN
INTRAVENOUS | Status: AC
  Filled 2018-05-20: qty 5

## 2018-05-20 NOTE — Progress Notes (Addendum)
      [address]       [place] [address]             [phone]       7 Days Post-Op Procedure(s) (LRB): VIDEO ASSISTED THORACOSCOPY (VATS)/LEFT UPPER LOBECTOMY (Left)  Subjective: Patient states took Ambien for sleep, which it did help her sleep but made her feel "off" this am  Objective: Vital signs in last 24 hours: Temp:  [97.6 F (36.4 C)-98.1 F (36.7 C)] 97.6 F (36.4 C) (09/11 0434) Pulse Rate:  [77-86] 81 (09/11 0329) Cardiac Rhythm: Supraventricular tachycardia (09/11 0357) Resp:  [19-35] 24 (09/11 0329) BP: (102-141)/(51-85) 122/63 (09/11 0434) SpO2:  [96 %-98 %] 96 % (09/11 0329)     Intake/Output from previous day: 09/10 0701 - 09/11 0700 In: 480 [P.O.:480] Out: 70 [Chest Tube:70]   Physical Exam:  Cardiovascular: RRR. Pulmonary: Clear to auscultation on right and coarse on the left. Subcutaneous emphysema left chest wall, arm, and neck Abdomen: Soft, non tender, bowel sounds present. Extremities: SCDs in place. Wounds:Clean and dry.   Chest Tube: to suction, no air leak this am-only tidling with cough  Lab Results: CBC: No results for input(s): WBC, HGB, HCT, PLT in the last 72 hours. BMET:  No results for input(s): NA, K, CL, CO2, GLUCOSE, BUN, CREATININE, CALCIUM in the last 72 hours.  PT/INR: No results for input(s): LABPROT, INR in the last 72 hours. ABG:  INR: Will add last result for INR, ABG once components are confirmed Will add last 4 CBG results once components are confirmed  Assessment/Plan:  1. CV - Mostly SR. She had at least 2 episodes of increased heart rate (up to 150's). EKG done showed incomplete RBBB, SR with PVCS/bigeminy, and prolonged QT. Patient was asymptomatic. On Toprol XL to 50 mg daily, as taken prior to surgery. Will ask cardiology to evaluate. Dr.Devann Cribb if should ask cardiology to see. 2.  Pulmonary - Chest tube is to suction and 70 recorded for last 24 hours. There is NO air leak this am,  only tidling with CXR this am appears to show stable/small left apical pneumothorax, subcutaneous emphysema left chest wall and neck and right chest wall. Hope to place chest tube to water seal. Continue Mucinex to help with cough. Encourage incentive spirometer.  3. Anemia-Last H and H increased to 11.7 and 35.4 pain 4. At patient request, no more Ambien 5. Will check BMET and Magnesium  Crystal M ZimmermanPA-C 05/20/2018,7:18 AM [phone] Some arrhythmias over night and this AM- was in SR when I went into see her and went into AF at 140-150 while I was in room- will give 2.5 mg of lopressor IV now, and ask Cardiology to see. No air leak- will try on water seal  Remo Lipps C. Roxan Hockey, MD [facility] [phone]

## 2018-05-20 NOTE — Consult Note (Addendum)
Cardiology Consultation:   Patient ID: [name] MRN: [id number]; DOB: September 12, 1940  Admit date: 05/13/2018 Date of Consult: 05/20/2018  Primary Care Provider: [name], MD Primary Cardiologist: New (Dr. Irish Lack)  Patient Profile:   [name] is a 77 y.o. female long time smoker with a hx of emphysema, HTN, HLD, GERD, cardiac conduction disorder, knee OA s/p double knee replacement and pulmonary nodules w/ recent significant enlargement of the left upper lobe nodule (spiculated), now day 7 s/p VATS/ left upper lobectomy, who is being seen today for the evaluation of atrial flutter w/ RVR  at the request of Dr. Roxan Hockey, [facility].   History of Present Illness:   [name] is a 77 y.o. female long time smoker with a hx of emphysema, HTN, HLD, GERD, cardiac conduction disorder, knee OA s/p double knee replacement and pulmonary nodules w/ recent significant enlargement of the left upper lobe nodule (spiculated), now day 7 s/p VATS/ left upper lobectomy, who is being seen today for the evaluation of atrial fibrillation w/ RVR,  at the request of Dr. Roxan Hockey, [facility].   Per chart review, there is no prior significant cardiac history although diagnosis of "cardiac conduction disorder" was placed in medical history in 2017. The only EKGs on file is from this admission, showing SR, frequent PACs and incomplete RBBB. Pt notes that roughly 18 years ago while living in [facility], [place], she had an EKG on routine physical examination at her PCP office and was told that it was abnormal. This lead to an "angiogram", per pt report, that was entirely normal. Because of her h/o abnormal EKGs, she was required to get a stress test in 2017 as part of preoperative evaluation prior to undergoing knee replacement. Pt thinks this was done at Dr. Irven Shelling office. She reports it was normal and she has not been back for f/u.   As noted above, pt recently found to have  significant size progression of a solid and spiculated LUL pulmonary nodule. PET scan showed no distant metastasis. On CT scan, 04/06/18, there were incidental findings of aortic and coronary artery atherosclerotic calcifications, in the LAD and RCA.   Prior to surgery, she had an EKG 05/08/18 which showed sinus bradycardia with rate of 58 bpm and nonspecific Twave abnormalities but no prior EKGs for comparison. Her VATS/ lobectomy was performed 1 week ago on 05/13/18. She had a repeat 12 lead EKG earlier today at [phone] which showed SR w/ frequent PACs, incomplete RBBB and lateral TW abnormalities. Later in the morning, she developed tachycardia with rates in the 150s and tele strips showed transient atrial fibrillation. The patient notes that she has had a hard time sleeping this admission and was given Ambien for the first time last night and she was every drowsy. She woke up this morning to use the restroom and when she got back in bed her RN came in and told her her HR was up in the 150s but pt was completely asymptomatic. Pt was given a dose of IV metoprolol and HR improved and pt now back in NSR with rates in the 80s.    Prior to admit, she denies any prior cardiac symptoms including no exertional CP, dyspnea, or palpitations. No prior h/o afib or flutter to her knowledge. No prior h/o stroke. She takes metoprolol daily for HTN.   Past Medical History:  Diagnosis Date  . Anxiety   . Blood glucose elevated 05/27/2012   pt. states that it  has only been slightly high  . BP (high blood pressure) 12/03/2011  . Cancer ([place])    skin cancer  . 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 CN (constipation) 12/06/2015  . 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 GERD (gastroesophageal reflux  disease)   . Hemorrhoid 12/06/2015  . History of hiatal hernia   . HLD (hyperlipidemia) 12/03/2011  . Hypertension   . Osteopenia 05/30/2015   Overview:  Bone density 05/2015 started fosamax   . Primary localized osteoarthritis of left knee   . Primary localized osteoarthritis of right knee 12/06/2015    Past Surgical History:  Procedure Laterality Date  . ABDOMINAL HYSTERECTOMY    . CATARACT EXTRACTION, BILATERAL    . COLONOSCOPY    . EYE SURGERY Bilateral    Cataract  . KNEE SURGERY  05/01/2016   left uncompartmental   . PARTIAL KNEE ARTHROPLASTY Right 12/18/2015   Procedure: UNICOMPARTMENTAL RIGHT KNEE;  Surgeon: Elsie Saas, MD;  Location: [facility];  Service: Orthopedics;  Laterality: Right;  . PARTIAL KNEE ARTHROPLASTY Left 05/01/2016   Procedure: UNICOMPARTMENTAL KNEE;  Surgeon: Elsie Saas, MD;  Location: [facility];  Service: Orthopedics;  Laterality: Left;  . TOTAL ABDOMINAL HYSTERECTOMY W/ BILATERAL SALPINGOOPHORECTOMY    . VIDEO ASSISTED THORACOSCOPY (VATS)/ LOBECTOMY Left 05/13/2018   Procedure: VIDEO ASSISTED THORACOSCOPY (VATS)/LEFT UPPER LOBECTOMY;  Surgeon: Melrose Nakayama, MD;  Location: [facility];  Service: Thoracic;  Laterality: Left;     Home Medications:  Prior to Admission medications   Medication Sig Start Date End Date Taking? Authorizing Provider  aspirin EC 81 MG tablet Take 81 mg by mouth every other day.    Yes [provider]  diphenhydramine-acetaminophen (TYLENOL PM) 25-500 MG TABS tablet Take 2 tablets by mouth at bedtime as needed (for sleep).   Yes [provider]  metoprolol succinate (TOPROL-XL) 50 MG 24 hr tablet Take 50 mg by mouth daily. Take with or immediately following a meal.   Yes [provider]  Multiple Vitamins-Minerals (MULTIVITAMIN PO) Take 1 tablet by mouth daily.   Yes [provider]  polyethylene glycol (MIRALAX / GLYCOLAX) packet Take 8.5 g by mouth every other day.   Yes [provider]    simvastatin (ZOCOR) 20 MG tablet Take 20 mg by mouth daily.   Yes [provider]    Inpatient Medications: Scheduled Meds: . aspirin EC  81 mg Oral QODAY  . bisacodyl  10 mg Oral Daily  . enoxaparin (LOVENOX) injection  40 mg Subcutaneous Q24H  . guaiFENesin  1,200 mg Oral BID  . metoprolol succinate  50 mg Oral Daily  . multivitamin   Oral Daily  . polyethylene glycol  8.5 g Oral QODAY  . senna-docusate  1 tablet Oral QHS  . simvastatin  20 mg Oral Daily   Continuous Infusions: . 0.9 % NaCl with KCl 20 mEq / L Stopped (05/17/18 0748)  . potassium chloride     PRN Meds: fentaNYL (SUBLIMAZE) injection, Influenza vac split quadrivalent PF, potassium chloride, traMADol  Allergies:    Allergies  Allergen Reactions  . Sulfa Antibiotics Other (See Comments)    UNSPECIFIED, childhood reaction  . Codeine Hives and Rash  . Hydrocodone Other (See Comments)  FATIGUE  . Meperidine Nausea And Vomiting    Social History:   Social History   Socioeconomic History  . Marital status: Married    Spouse name: Not on file  . Number of children: Not on file  . Years of education: Not on file  . Highest education level: Not on file  Occupational History  . Not on file  Social Needs  . Financial resource strain: Not on file  . Food insecurity:    Worry: Not on file    Inability: Not on file  . Transportation needs:    Medical: Not on file    Non-medical: Not on file  Tobacco Use  . Smoking status: Current Every Day Smoker    Years: 50.00    Types: Cigarettes  . Smokeless tobacco: Never Used  . Tobacco comment: only one cigarette occasionally  Substance and Sexual Activity  . Alcohol use: Yes    Comment: social rare  . Drug use: No  . Sexual activity: Not on file  Lifestyle  . Physical activity:    Days per week: Not on file    Minutes per session: Not on file  . Stress: Not on file  Relationships  . Social connections:    Talks on phone: Not on file     Gets together: Not on file    Attends religious service: Not on file    Active member of club or organization: Not on file    Attends meetings of clubs or organizations: Not on file    Relationship status: Not on file  . Intimate partner violence:    Fear of current or ex partner: Not on file    Emotionally abused: Not on file    Physically abused: Not on file    Forced sexual activity: Not on file  Other Topics Concern  . Not on file  Social History Narrative  . Not on file    Family History:    Family History  Problem Relation Age of Onset  . Congestive Heart Failure Mother      ROS:  Please see the history of present illness.   All other ROS reviewed and negative.     Physical Exam/Data:   Vitals:   05/19/18 1918 05/20/18 0329 05/20/18 0434 05/20/18 0721  BP: 102/85 (!) 141/58 122/63 108/84  Pulse: 85 81  87  Resp: (!) 35 (!) 24  (!) 26  Temp: 98 F (36.7 C)  97.6 F (36.4 C) 97.6 F (36.4 C)  TempSrc: Oral  Oral Oral  SpO2: 98% 96%  97%  Weight:      Height:        Intake/Output Summary (Last 24 hours) at 05/20/2018 1108 Last data filed at 05/20/2018 1000 Gross per 24 hour  Intake 360 ml  Output 270 ml  Net 90 ml   Filed Weights   05/13/18 0642 05/13/18 1547  Weight: 63.4 kg 64 kg   Body mass index is 25.4 kg/m.  General:  Well nourished, well developed, in no acute distress HEENT: normal Lymph: no adenopathy Neck: no JVD Endocrine:  No thryomegaly Vascular: No carotid bruits; FA pulses 2+ bilaterally without bruits  Cardiac:  normal S1, S2; RRR; no murmur  Lungs:  clear to auscultation bilaterally with some course BS on the left. Chest tubes in place Abd: soft, nontender, no hepatomegaly  Ext: no edema Musculoskeletal:  No deformities, BUE and BLE strength normal and equal Skin: warm and dry  Neuro:  CNs 2-12  intact, no focal abnormalities noted Psych:  Normal affect   EKG:  The EKG was personally reviewed and demonstrates:  SR w/ frequent  PACs Telemetry:  Telemetry was personally reviewed and demonstrates:  Transient afib in the 140s earlier, currently NSR.   Relevant CV Studies: 2D echo pending  Laboratory Data:  Chemistry Recent Labs  Lab 05/14/18 0358 05/15/18 0400 05/20/18 0819  NA 137 137 130*  K 3.9 3.9 4.2  CL 110 105 96*  CO2 20* 22 24  GLUCOSE 101* 123* 119*  BUN 10 8 12   CREATININE 0.75 0.78 0.75  CALCIUM 7.6* 8.5* 8.8*  GFRNONAA >60 >60 >60  GFRAA >60 >60 >60  ANIONGAP 7 10 10     Recent Labs  Lab 05/15/18 0400  PROT 5.7*  ALBUMIN 3.2*  AST 22  ALT 9  ALKPHOS 48  BILITOT 0.9   Hematology Recent Labs  Lab 05/13/18 1115 05/14/18 0536 05/15/18 0400  WBC  --  13.5* 13.5*  RBC  --  3.32* 3.56*  HGB 11.9* 10.8* 11.7*  HCT 35.0* 33.0* 35.4*  MCV  --  99.4 99.4  MCH  --  32.5 32.9  MCHC  --  32.7 33.1  RDW  --  12.1 12.2  PLT  --  156 162   Cardiac EnzymesNo results for input(s): TROPONINI in the last 168 hours. No results for input(s): TROPIPOC in the last 168 hours.  BNPNo results for input(s): BNP, PROBNP in the last 168 hours.  DDimer No results for input(s): DDIMER in the last 168 hours.  Radiology/Studies:  Dg Chest Port 1 View  Result Date: 05/20/2018 CLINICAL DATA:  Follow-up LEFT pneumothorax and subcutaneous emphysema. EXAM: PORTABLE CHEST 1 VIEW COMPARISON:  05/19/2018, 05/18/2018 and earlier. FINDINGS: Prior LEFT UPPER lobectomy. LEFT chest tube in place with a small residual apicolateral pneumothorax, 5% or less, unchanged. Atelectasis in the base of the remaining LEFT LOWER LOBE, unchanged. RIGHT lung clear apart from scarring at the LATERAL base. Subcutaneous emphysema throughout the chest wall, neck and UPPER arms, unchanged. No new abnormalities. IMPRESSION: 1. Stable small LEFT apicolateral pneumothorax with LEFT chest tube in place. 2. Stable LEFT LOWER LOBE atelectasis. 3. Stable subcutaneous emphysema. 4. No new abnormalities. Electronically Signed   By: Evangeline Dakin M.D.   On: 05/20/2018 10:11   Dg Chest Port 1 View  Result Date: 05/19/2018 CLINICAL DATA:  77 year old female post left lung surgery. Air leak. Subsequent encounter. EXAM: PORTABLE CHEST 1 VIEW COMPARISON:  05/18/2018 chest x-ray. FINDINGS: Post left lung surgery. Left chest tube in place unchanged in position. Slight increase in size of small left apical pneumothorax. Marked amount of subcutaneous emphysema and greater on the left. Left lower lobe consolidation may represent atelectasis and unchanged. IMPRESSION: 1. Slight increase in size of small left apical pneumothorax. 2. Marked subcutaneous emphysema greater on left. 3. Postsurgical changes left chest with consolidation left base possibly represent atelectasis. Electronically Signed   By: Genia Del M.D.   On: 05/19/2018 09:29   Dg Chest Port 1 View  Result Date: 05/18/2018 CLINICAL DATA:  Pneumothorax. EXAM: PORTABLE CHEST 1 VIEW COMPARISON:  Radiographs of May 17, 2018. FINDINGS: Stable cardiomediastinal silhouette. Right lung is clear. Left-sided chest tube is noted. Stable minimal left apical pneumothorax is noted. Left basilar atelectasis or infiltrate is noted. Stable large bilateral subcutaneous emphysema is noted. Bony thorax is unremarkable. IMPRESSION: Stable position of left-sided chest tube with stable minimal left apical pneumothorax. Stable large amount of bilateral subcutaneous emphysema  is noted. Left basilar atelectasis or infiltrate is noted. Electronically Signed   By: Marijo Conception, M.D.   On: 05/18/2018 08:25   Dg Chest Port 1 View  Result Date: 05/17/2018 CLINICAL DATA:  Pneumothorax EXAM: PORTABLE CHEST 1 VIEW COMPARISON:  May 16, 2018 FINDINGS: A tiny left apical pneumothorax probably persists, unchanged. Significant air in the left greater than right chest subcutaneous tissues is similar in the interval. Persistent opacity in the left base. The right lung remains clear. The cardiomediastinal  silhouette is unchanged. IMPRESSION: 1. The left chest tube is stable. Probable tiny left apical pneumothorax. Persistent significant air in the subcutaneous tissues of the chest wall, left greater than right, unchanged. 2. Persistent left retrocardiac opacity/possible atelectasis. Electronically Signed   By: Dorise Bullion III M.D   On: 05/17/2018 07:22    Assessment and Plan:   [name] is a 77 y.o. female long time smoker with a hx of emphysema, HTN, HLD, GERD, cardiac conduction disorder, knee OA s/p double knee replacement and pulmonary nodules w/ recent significant enlargement of the left upper lobe nodule (spiculated), now day 7 s/p VATS/ left upper lobectomy, who is being seen today for the evaluation of atrial flutter w/ RVR  at the request of Dr. Roxan Hockey, [facility].   1. Transient Atrial Fibrillation: fist time documented occurrence. Very brief and pt was entirely asymptomatic. HR improved after dose of IV metoprolol and pt back in NSR with HR in the 80s. She had recent VATS and left upper lobectomy for pulmonary nodule 7 days ago. K is WNL at 4.2. SCr/BUN WNL, but no significant arrhthymias until today. She is Ambien naive and was given 1st dose last night for sleep aid. Per literature review, tachycardia is potential adverse side effect however <1%. I'm not sure if this was a possible trigger vs pulmonary etiology/ recent surgery. We recommend continuation of telemetry monitoring to assess for recurrence. Continue PO  Metoprolol for rate control. Avoid further use of Ambien for now. We will check 2D echo to assess LVEF, wall motion, valve anatomy and chamber sizes. Will also check a TSH to check thyroid function and CBC to ensure H/U is stable. There is no indication for anticoagulation at this time, given isolated transient afib. If she proves to have recurrent Afib we will need to reconsider, but can only initiate once safe to do so from a bleed standpoint given recent  lobectomy. CHA2DS2 VASc score is at least 5 for age >82, HTN, female sex and vascular dz (evidence of coronary/aortic calcifications). If no recurrence noted on tele while impatient, would recommend further outpatient survalience with 30 day event monitor or implantable loop. Dr. Irish Lack to follow with further recommendations.   2. Coronary Artery Calcifications: noted on chest CT. Asymptomatic. No exertional CP or dyspnea. Pt reports normal angiogram 18 years ago and recent low risk stress test in 2017 prior to undergoing TKR. Continue medical therapy w/ ASA, BB and statin. Pt states she has given up tobacco use.    3. Pulmonary Nodule: Day 7 s/p VATS/LULobectomy. Management per CT surgery. Chest tubes still in place.  4. Tobacco Abuse: pt reports that she recently quit.   5. HTN: controlled on current regimen. Continue metoprolol.  6. HLD: on statin therapy w/ simvastatin. Followed by PCP. Given coronary artery calcifications on CT and other CRFs, recommend target LDL goal <70 mg/dL.    For questions or updates, please contact [place] Please consult [email or web address] for contact info under  Signed, Lyda Jester, PA-C  05/20/2018 11:08 AM   I have examined the patient and reviewed assessment and plan and discussed with patient.  Agree with above as stated.  Patient with transient AFib.  Will continue to watch on tele.  If she has a recurrence, would consider anticoagulation.  Her CHADS-vasc score is high.  AFib may be related to stress of surgery and sleep disturbance.  WIll consider outpaitent monitor as well depending on when she is discharged.  WIll follow.   Larae Grooms

## 2018-05-20 NOTE — Care Management Note (Signed)
Case Management Note  Patient Details  Name: [name] MRN: [id number] Date of Birth: 08/25/1941  Subjective/Objective:  Pt is s/p VATS                    Action/Plan:  PTA Independent from home.  Pt has PCP.  Pt independently ambulating in room   Expected Discharge Date:                  Expected Discharge Plan:  [place]  In-House Referral:     Discharge planning Services     Post Acute Care Choice:    Choice offered to:     DME Arranged:    DME Agency:     HH Arranged:    HH Agency:     Status of Service:     If discussed at [employer], dates discussed:    Additional Comments: 05/20/2018 Pt continues to have CT to suction - sub q air present on exam Maryclare Labrador, RN 05/20/2018, 9:57 AM

## 2018-05-20 NOTE — Progress Notes (Signed)
  Was notified by eco tech that attempt was made to perform echocardiogram, however unable to get good images due to subcutaneous emphysema in the left chest wall from small left apical pneumothorax following VATS/ left upper lobectomy. Chest tubes remain in place. We will postpone echo for today. Can re attempt later this week.  Lyda Jester, PA-C 05/20/2018

## 2018-05-20 NOTE — Progress Notes (Signed)
Heart rate -150's to 160's afib on and off, asymptomatic.MD aware am dose of lopressor and lopressor 2.5 mg iv given. Continue to monitor.

## 2018-05-21 ENCOUNTER — Other Ambulatory Visit: Payer: Self-pay | Admitting: *Deleted

## 2018-05-21 ENCOUNTER — Inpatient Hospital Stay (HOSPITAL_COMMUNITY): Payer: Medicare Other

## 2018-05-21 LAB — CBC
HEMATOCRIT: 34.8 % — AB (ref 36.0–46.0)
Hemoglobin: 11.6 g/dL — ABNORMAL LOW (ref 12.0–15.0)
MCH: 32.7 pg (ref 26.0–34.0)
MCHC: 33.3 g/dL (ref 30.0–36.0)
MCV: 98 fL (ref 78.0–100.0)
Platelets: 247 10*3/uL (ref 150–400)
RBC: 3.55 MIL/uL — ABNORMAL LOW (ref 3.87–5.11)
RDW: 12 % (ref 11.5–15.5)
WBC: 10.3 10*3/uL (ref 4.0–10.5)

## 2018-05-21 LAB — TSH: TSH: 1.541 u[IU]/mL (ref 0.350–4.500)

## 2018-05-21 NOTE — Progress Notes (Signed)
Progress Note  Patient Name: [name] Date of Encounter: 05/21/2018  Primary Cardiologist: Larae Grooms, MD   Subjective   No palpitations  Inpatient Medications    Scheduled Meds: . aspirin EC  81 mg Oral QODAY  . bisacodyl  10 mg Oral Daily  . enoxaparin (LOVENOX) injection  40 mg Subcutaneous Q24H  . guaiFENesin  1,200 mg Oral BID  . metoprolol succinate  50 mg Oral Daily  . multivitamin   Oral Daily  . polyethylene glycol  8.5 g Oral QODAY  . senna-docusate  1 tablet Oral QHS  . simvastatin  20 mg Oral Daily   Continuous Infusions: . 0.9 % NaCl with KCl 20 mEq / L Stopped (05/17/18 0748)  . potassium chloride     PRN Meds: fentaNYL (SUBLIMAZE) injection, Influenza vac split quadrivalent PF, potassium chloride, traMADol   Vital Signs    Vitals:   05/20/18 2350 05/21/18 0626 05/21/18 0800 05/21/18 0849  BP: (!) 113/57 122/60  109/74  Pulse: 80 88    Resp: 18 (!) 22    Temp: 97.6 F (36.4 C) 97.6 F (36.4 C) 97.9 F (36.6 C)   TempSrc: Oral Oral Oral   SpO2:  99%    Weight:      Height:        Intake/Output Summary (Last 24 hours) at 05/21/2018 1140 Last data filed at 05/21/2018 0900 Gross per 24 hour  Intake 240 ml  Output 90 ml  Net 150 ml   Filed Weights   05/13/18 0642 05/13/18 1547  Weight: 63.4 kg 64 kg    Telemetry    No further AFib - Personally Reviewed  ECG    *  Physical Exam   GEN: No acute distress.   Neck: No JVD Cardiac: RRR, no murmurs, rubs, or gallops.  Respiratory: Clear to auscultation bilaterally. GI: Soft, nontender, non-distended  MS: No edema; No deformity. Neuro:  Nonfocal  Psych: Normal affect   Labs    Chemistry Recent Labs  Lab 05/15/18 0400 05/20/18 0819  NA 137 130*  K 3.9 4.2  CL 105 96*  CO2 22 24  GLUCOSE 123* 119*  BUN 8 12  CREATININE 0.78 0.75  CALCIUM 8.5* 8.8*  PROT 5.7*  --   ALBUMIN 3.2*  --   AST 22  --   ALT 9  --   ALKPHOS 48  --   BILITOT 0.9  --     GFRNONAA >60 >60  GFRAA >60 >60  ANIONGAP 10 10     Hematology Recent Labs  Lab 05/15/18 0400 05/21/18 0250  WBC 13.5* 10.3  RBC 3.56* 3.55*  HGB 11.7* 11.6*  HCT 35.4* 34.8*  MCV 99.4 98.0  MCH 32.9 32.7  MCHC 33.1 33.3  RDW 12.2 12.0  PLT 162 247    Cardiac EnzymesNo results for input(s): TROPONINI in the last 168 hours. No results for input(s): TROPIPOC in the last 168 hours.   BNPNo results for input(s): BNP, PROBNP in the last 168 hours.   DDimer No results for input(s): DDIMER in the last 168 hours.   Radiology    Dg Chest Port 1 View  Result Date: 05/21/2018 CLINICAL DATA:  Pneumothorax EXAM: PORTABLE CHEST 1 VIEW COMPARISON:  05/20/2018 FINDINGS: Left chest tube remains in place, unchanged. Extensive subcutaneous emphysema throughout the chest wall bilaterally, left greater than right. Small residual left apical pneumothorax, stable. Left base atelectasis or infiltrate. Heart is normal size. Underlying emphysema. IMPRESSION: Stable small left  apical pneumothorax and extensive subcutaneous emphysema. Left chest tube remains in place. Left lower lobe atelectasis or infiltrate. COPD. Electronically Signed   By: Rolm Baptise M.D.   On: 05/21/2018 09:46   Dg Chest Port 1 View  Result Date: 05/20/2018 CLINICAL DATA:  Follow-up LEFT pneumothorax and subcutaneous emphysema. EXAM: PORTABLE CHEST 1 VIEW COMPARISON:  05/19/2018, 05/18/2018 and earlier. FINDINGS: Prior LEFT UPPER lobectomy. LEFT chest tube in place with a small residual apicolateral pneumothorax, 5% or less, unchanged. Atelectasis in the base of the remaining LEFT LOWER LOBE, unchanged. RIGHT lung clear apart from scarring at the LATERAL base. Subcutaneous emphysema throughout the chest wall, neck and UPPER arms, unchanged. No new abnormalities. IMPRESSION: 1. Stable small LEFT apicolateral pneumothorax with LEFT chest tube in place. 2. Stable LEFT LOWER LOBE atelectasis. 3. Stable subcutaneous emphysema. 4. No new  abnormalities. Electronically Signed   By: Evangeline Dakin M.D.   On: 05/20/2018 10:11    Cardiac Studies     Patient Profile     77 y.o. female with PAF post surgery  Assessment & Plan    1) AFib, COntinue to watch on tele.  We discussed prolonged anticoagulation.  She would like to avoid this.  As long as she does not have any further AFib, would continue to hold off. COntineu metoprolol.  UNable to get echo images due to interference from air.      For questions or updates, please contact [place] Please consult [email or web address] for contact info under        Signed, Larae Grooms, MD  05/21/2018, 11:40 AM

## 2018-05-21 NOTE — Progress Notes (Addendum)
      [address]       [place] [address]             [phone]       8 Days Post-Op Procedure(s) (LRB): VIDEO ASSISTED THORACOSCOPY (VATS)/LEFT UPPER LOBECTOMY (Left)  Subjective: Patient slept well and feels better than she did yesterday. She sounds less nasally today  Objective: Vital signs in last 24 hours: Temp:  [97.6 F (36.4 C)-97.8 F (36.6 C)] 97.6 F (36.4 C) (09/12 0626) Pulse Rate:  [80-88] 88 (09/12 0626) Cardiac Rhythm: Normal sinus rhythm (09/12 0400) Resp:  [18-29] 22 (09/12 0626) BP: (83-122)/(49-84) 122/60 (09/12 0626) SpO2:  [97 %-99 %] 99 % (09/12 0626)     Intake/Output from previous day: 09/11 0701 - 09/12 0700 In: 120 [P.O.:120] Out: 290 [Urine:200; Chest Tube:90]   Physical Exam:  Cardiovascular: RRR. Pulmonary: Clear to auscultation on right and coarse on the left. Subcutaneous emphysema left chest wall, arm, and neck but slowly resolving Abdomen: Soft, non tender, bowel sounds present. Extremities: Trace LE edema Wounds:Clean and dry.   Chest Tube: to water seal, no air leak this am-only tidling with cough  Lab Results: CBC: Recent Labs    05/21/18 0250  WBC 10.3  HGB 11.6*  HCT 34.8*  PLT 247   BMET:  Recent Labs    05/20/18 0819  NA 130*  K 4.2  CL 96*  CO2 24  GLUCOSE 119*  BUN 12  CREATININE 0.75  CALCIUM 8.8*    PT/INR: No results for input(s): LABPROT, INR in the last 72 hours. ABG:  INR: Will add last result for INR, ABG once components are confirmed Will add last 4 CBG results once components are confirmed  Assessment/Plan:  1. CV - Transient a fib yesterday. SR in the 80's this am. On Toprol XL to 50 mg daily, as taken prior to surgery. Appreciate cardiology's assistance-unable to do echo secondary to subcutaneous emphysema. 2.  Pulmonary - Chest tube is to water seal and 90 recorded for last 24 hours. There is NO air leak this am, only tidling with CXR this am appears to show ? Trace  left apical pneumothorax, subcutaneous emphysema left chest wall and neck and right chest wall. Hope to remove chest tube soon-?today. Continue Mucinex to help with cough. Encourage incentive spirometer.  3. Anemia-Last H and H increased to 11.6 and 34.8 pain   Donielle M ZimmermanPA-C 05/21/2018,7:16 AM [phone]  Patient seen and examined, agree with above No air leak on repeated coughing- will dc chest tube today  Remo Lipps C. Roxan Hockey, MD [facility] [phone]

## 2018-05-22 ENCOUNTER — Inpatient Hospital Stay (HOSPITAL_COMMUNITY): Payer: Medicare Other

## 2018-05-22 ENCOUNTER — Telehealth: Payer: Self-pay

## 2018-05-22 DIAGNOSIS — R002 Palpitations: Secondary | ICD-10-CM

## 2018-05-22 MED ORDER — GUAIFENESIN ER 600 MG PO TB12: 1200.0000 mg | ORAL_TABLET | Freq: Two times a day (BID) | ORAL | Status: DC | PRN

## 2018-05-22 MED ORDER — TRAMADOL HCL 50 MG PO TABS: ORAL_TABLET | ORAL | 0 refills | Status: DC

## 2018-05-22 MED ORDER — DEXTROSE 50 % IV SOLN
INTRAVENOUS | Status: AC
  Filled 2018-05-22: qty 50

## 2018-05-22 NOTE — Progress Notes (Addendum)
      [address]       [place] [address]             [phone]       9 Days Post-Op Procedure(s) (LRB): VIDEO ASSISTED THORACOSCOPY (VATS)/LEFT UPPER LOBECTOMY (Left)  Subjective: Patient states she sat in the chair most of yesterday and feels better. Still with some incisional pain.  Objective: Vital signs in last 24 hours: Temp:  [97.5 F (36.4 C)-97.9 F (36.6 C)] 97.5 F (36.4 C) (09/13 0325) Pulse Rate:  [70-85] 70 (09/13 0325) Cardiac Rhythm: Supraventricular tachycardia (09/13 0522) Resp:  [14-24] 14 (09/13 0325) BP: (99-114)/(47-74) 114/65 (09/13 0325) SpO2:  [97 %-100 %] 99 % (09/13 0325)     Intake/Output from previous day: 09/12 0701 - 09/13 0700 In: 720 [P.O.:720] Out: 2 [Urine:2]   Physical Exam:  Cardiovascular: RRR. Pulmonary: Clear to auscultation on right and coarse on the left. (secondary to subcutaneous emphysema left chest wall, arm, and neck) but slowly resolving Abdomen: Soft, non tender, bowel sounds present. Extremities: No LE edema Wounds: Clean and dry.     Lab Results: CBC: Recent Labs    05/21/18 0250  WBC 10.3  HGB 11.6*  HCT 34.8*  PLT 247   BMET:  Recent Labs    05/20/18 0819  NA 130*  K 4.2  CL 96*  CO2 24  GLUCOSE 119*  BUN 12  CREATININE 0.75  CALCIUM 8.8*    PT/INR: No results for input(s): LABPROT, INR in the last 72 hours. ABG:  INR: Will add last result for INR, ABG once components are confirmed Will add last 4 CBG results once components are confirmed  Assessment/Plan:  1. CV - Transient a fib  SR in the 80's this am. On Toprol XL to 50 mg daily, as taken prior to surgery. Appreciate cardiology's assistance-unable to do echo secondary to subcutaneous emphysema. 2.  Pulmonary - Chest tube removed yesterday. CXR this am appears to show no pneumothorax, stable subcutaneous emphysema left chest wall and neck and right chest wall. Hope to remove chest tube soon-?today. Continue Mucinex  to help with cough. Encourage incentive spirometer.  3. Anemia-Last H and H increased to 11.6 and 34.8 pain 4. Per Dr. Roxan Hockey, discharge  Korde Jeppsen M ZimmermanPA-C 05/22/2018,7:10 AM [phone]

## 2018-05-22 NOTE — Care Management Important Message (Signed)
Important Message  Patient Details  Name: [name] MRN: [id number] Date of Birth: 06/11/1941   Medicare Important Message Given:  Yes    Jenilyn Magana P [place] 05/22/2018, 11:24 AM

## 2018-05-22 NOTE — Progress Notes (Signed)
9 Days Post-Op Procedure(s) (LRB): VIDEO ASSISTED THORACOSCOPY (VATS)/LEFT UPPER LOBECTOMY (Left) Subjective: No complaints this AM, wants to go home  Objective: Vital signs in last 24 hours: Temp:  [97.5 F (36.4 C)-97.9 F (36.6 C)] 97.5 F (36.4 C) (09/13 0325) Pulse Rate:  [70-85] 70 (09/13 0325) Cardiac Rhythm: Supraventricular tachycardia (09/13 0522) Resp:  [14-24] 14 (09/13 0325) BP: (99-114)/(47-74) 114/65 (09/13 0325) SpO2:  [97 %-100 %] 99 % (09/13 0325)  Hemodynamic parameters for last 24 hours:    Intake/Output from previous day: 09/12 0701 - 09/13 0700 In: 720 [P.O.:720] Out: 2 [Urine:2] Intake/Output this shift: No intake/output data recorded.  General appearance: alert, cooperative and no distress Neurologic: intact Heart: regular rate and rhythm Lungs: clear to auscultation bilaterally Wound: CT site with local irritation, wound clean  Lab Results: Recent Labs    05/21/18 0250  WBC 10.3  HGB 11.6*  HCT 34.8*  PLT 247   BMET:  Recent Labs    05/20/18 0819  NA 130*  K 4.2  CL 96*  CO2 24  GLUCOSE 119*  BUN 12  CREATININE 0.75  CALCIUM 8.8*    PT/INR: No results for input(s): LABPROT, INR in the last 72 hours. ABG    Component Value Date/Time   PHART 7.422 05/14/2018 0445   HCO3 21.0 05/14/2018 0445   TCO2 28 05/13/2018 1115   ACIDBASEDEF 2.8 (H) 05/14/2018 0445   O2SAT 94.0 05/14/2018 0445   CBG (last 3)  Recent Labs    05/19/18 0817  GLUCAP 121*    Assessment/Plan: S/P Procedure(s) (LRB): VIDEO ASSISTED THORACOSCOPY (VATS)/LEFT UPPER LOBECTOMY (Left) Plan for discharge: see discharge orders  Looks great CXR improved Transiently elevated HR earlier this AM In SR DC home later today   LOS: 9 days    Melrose Nakayama 05/22/2018

## 2018-05-22 NOTE — Care Management Note (Signed)
Case Management Note  Patient Details  Name: [name] MRN: [id number] Date of Birth: 1941-09-02  Subjective/Objective:  Pt is s/p VATS                    Action/Plan:  PTA Independent from home.  Pt has PCP.  Pt independently ambulating in room   Expected Discharge Date:  05/22/18               Expected Discharge Plan:  [place]  In-House Referral:     Discharge planning Services     Post Acute Care Choice:    Choice offered to:     DME Arranged:    DME Agency:     HH Arranged:    HH Agency:     Status of Service:     If discussed at [employer], dates discussed:    Additional Comments: 05/22/2018  CT removed.  Pt to discharge home today.  NO CM Needs identified at discharge   05/20/18 Pt continues to have CT to suction - sub q air present on exam Maryclare Labrador, RN 05/22/2018, 8:08 AM

## 2018-05-22 NOTE — Progress Notes (Signed)
DC instructions given to patient and spouse and son at time of discharge. Questions answered. Rx given. Pt educated on S/S infection and SOB educated to call CVTS office. Pt voice more normal. Instructed on dressing change and site care. Continue to mmointor subcutaneous air.  Encouraged IS continued use. Reminded patient of f/u appointments. PIV DC, hemostasis achieved. VSS. CCMD notified of discharge. All belongings sent home with patient.   Pt escorted by NT via wheelchair to private vehicle driven by son.

## 2018-05-22 NOTE — Progress Notes (Signed)
Progress Note  Patient Name: [name] Date of Encounter: 05/22/2018  Primary Cardiologist: Larae Grooms, MD   Subjective   No chest pain or palpitations.  Feeling better.  Wants to go home.   Inpatient Medications    Scheduled Meds: . aspirin EC  81 mg Oral QODAY  . bisacodyl  10 mg Oral Daily  . enoxaparin (LOVENOX) injection  40 mg Subcutaneous Q24H  . guaiFENesin  1,200 mg Oral BID  . metoprolol succinate  50 mg Oral Daily  . multivitamin   Oral Daily  . polyethylene glycol  8.5 g Oral QODAY  . senna-docusate  1 tablet Oral QHS  . simvastatin  20 mg Oral Daily   Continuous Infusions: . 0.9 % NaCl with KCl 20 mEq / L Stopped (05/17/18 0748)  . potassium chloride     PRN Meds: fentaNYL (SUBLIMAZE) injection, Influenza vac split quadrivalent PF, potassium chloride, traMADol   Vital Signs    Vitals:   05/21/18 2303 05/22/18 0300 05/22/18 0325 05/22/18 0758  BP: 101/61  114/65 130/68  Pulse: 75  70 85  Resp: 18 (!) 24 14 18   Temp: (!) 97.5 F (36.4 C)  (!) 97.5 F (36.4 C) (!) 97.5 F (36.4 C)  TempSrc: Oral  Oral Oral  SpO2: 98%  99%   Weight:      Height:        Intake/Output Summary (Last 24 hours) at 05/22/2018 0927 Last data filed at 05/21/2018 1920 Gross per 24 hour  Intake 480 ml  Output 1 ml  Net 479 ml   Filed Weights   05/13/18 0642 05/13/18 1547  Weight: 63.4 kg 64 kg    Telemetry    NSR - Personally Reviewed  ECG      Physical Exam   GEN: No acute distress.   Neck: No JVD Cardiac: RRR, no murmurs, rubs, or gallops.  Respiratory: Clear to auscultation bilaterally. GI: Soft, nontender, non-distended  MS: No edema; No deformity. SubQ emphysema noted Neuro:  Nonfocal  Psych: Normal affect   Labs    Chemistry Recent Labs  Lab 05/20/18 0819  NA 130*  K 4.2  CL 96*  CO2 24  GLUCOSE 119*  BUN 12  CREATININE 0.75  CALCIUM 8.8*  GFRNONAA >60  GFRAA >60  ANIONGAP 10     Hematology Recent Labs  Lab  05/21/18 0250  WBC 10.3  RBC 3.55*  HGB 11.6*  HCT 34.8*  MCV 98.0  MCH 32.7  MCHC 33.3  RDW 12.0  PLT 247    Cardiac EnzymesNo results for input(s): TROPONINI in the last 168 hours. No results for input(s): TROPIPOC in the last 168 hours.   BNPNo results for input(s): BNP, PROBNP in the last 168 hours.   DDimer No results for input(s): DDIMER in the last 168 hours.   Radiology    Dg Chest 2 View  Result Date: 05/22/2018 CLINICAL DATA:  Follow-up pneumothorax with left chest tube removal yesterday EXAM: CHEST - 2 VIEW COMPARISON:  05/21/2018 FINDINGS: The small left apical pneumothorax is again identified but has decreased somewhat in size when compared with the prior exam. Extensive subcutaneous emphysema is again noted. Cardiac shadow is stable. Stable left basilar opacities are seen. No new focal infiltrate is noted. No bony abnormality is seen. IMPRESSION: Stable left lower lobe consolidation. Slight decrease in left apical pneumothorax when compare with the prior exam. Electronically Signed   By: Inez Catalina M.D.   On: 05/22/2018 07:37  Dg Chest 1v Repeat Same Day  Result Date: 05/21/2018 CLINICAL DATA:  Status post left lobectomy. Status post chest tube removal. EXAM: CHEST - 1 VIEW SAME DAY COMPARISON:  05/21/2018 at 4:52 a.m. FINDINGS: Left chest tube has been removed since the earlier exam. Small left apical pneumothorax is without change. Extensive subcutaneous emphysema is also unchanged. Pulmonary anastomosis staples are superimposed over the left hilum extending along the left lower lung adjacent to the left heart border. There is opacity at the left lung base consistent with atelectasis. These findings are stable. Remainder of the lungs is clear. Possible small left pleural effusion. No convincing right pleural effusion. No right pneumothorax. IMPRESSION: 1. Status post left chest tube removal since the earlier study. 2. Small left apical pneumothorax is without change. 3.  Stable extensive subcutaneous emphysema. Stable left lung base opacity. Electronically Signed   By: Lajean Manes M.D.   On: 05/21/2018 15:12   Dg Chest Port 1 View  Result Date: 05/21/2018 CLINICAL DATA:  Pneumothorax EXAM: PORTABLE CHEST 1 VIEW COMPARISON:  05/20/2018 FINDINGS: Left chest tube remains in place, unchanged. Extensive subcutaneous emphysema throughout the chest wall bilaterally, left greater than right. Small residual left apical pneumothorax, stable. Left base atelectasis or infiltrate. Heart is normal size. Underlying emphysema. IMPRESSION: Stable small left apical pneumothorax and extensive subcutaneous emphysema. Left chest tube remains in place. Left lower lobe atelectasis or infiltrate. COPD. Electronically Signed   By: Rolm Baptise M.D.   On: 05/21/2018 09:46    Cardiac Studies   Unable to do echo  Patient Profile     77 y.o. female with one episode of atrial fibrillation.  Assessment & Plan    1) no recurrence of atrial fibrillation.  We discussed long-term anticoagulation.  She would like to avoid this.  I think this is reasonable.  She will monitor for any symptoms.  Will plan for at least a 30-day monitor to look for any occult atrial fibrillation.  She is agreeable to this.  She is on baby aspirin.  She is already on metoprolol.  Continue these medications.  It appears the patient will be discharged today.  [facility] will sign off.   Medication Recommendations:  COntinue metoprolol and aspirin Other recommendations (labs, testing, etc):  30 day monitor to look for AFib Follow up as an outpatient:  Based on monitor  For questions or updates, please contact [place] Please consult [email or web address] for contact info under        Signed, Larae Grooms, MD  05/22/2018, 9:27 AM

## 2018-05-22 NOTE — Telephone Encounter (Signed)
Attempted to contact patient to arrange monitor, but there was no answer. Left message for patient to call back.   Jettie Booze, MD  Driscilla Grammes, [place] I, RN        [place], [place] patient is being discharged today. She had some AFib without symptoms so would need a 30 day continuous tele monitor to look for AFib.  THanks. JV

## 2018-05-25 ENCOUNTER — Ambulatory Visit (INDEPENDENT_AMBULATORY_CARE_PROVIDER_SITE_OTHER): Payer: Self-pay | Admitting: *Deleted

## 2018-05-25 DIAGNOSIS — Z4802 Encounter for removal of sutures: Secondary | ICD-10-CM

## 2018-05-25 DIAGNOSIS — C3412 Malignant neoplasm of upper lobe, left bronchus or lung: Secondary | ICD-10-CM

## 2018-05-25 DIAGNOSIS — Z902 Acquired absence of lung [part of]: Secondary | ICD-10-CM

## 2018-05-25 NOTE — Progress Notes (Signed)
[name] returns s/p LULobectomy on 05/13/18 being discharged 05/22/18. She is doing well at home. She didn't fill the Tramadol script and is use only Tylenol for soreness. Diet and bowels are good. Her left VATS incisions and one chest tube site are very well healed. The suture was easily removed from the chest tube site. She will return as scheduled with a CXR.

## 2018-05-27 ENCOUNTER — Telehealth: Payer: Self-pay | Admitting: Interventional Cardiology

## 2018-05-27 NOTE — Telephone Encounter (Signed)
Patient returning call. 30 day continuous telemetry monitor ordered to assess for Afib. Appointment made for 06/02/18 at 11:30 AM.

## 2018-05-27 NOTE — Telephone Encounter (Signed)
New message:       Pt is returning a call

## 2018-05-27 NOTE — Telephone Encounter (Signed)
See telephone encounter 9/13

## 2018-05-27 NOTE — Telephone Encounter (Signed)
Left message for patient to call back  

## 2018-06-02 ENCOUNTER — Ambulatory Visit (INDEPENDENT_AMBULATORY_CARE_PROVIDER_SITE_OTHER): Payer: Medicare Other

## 2018-06-02 DIAGNOSIS — R002 Palpitations: Secondary | ICD-10-CM | POA: Diagnosis not present

## 2018-06-08 ENCOUNTER — Other Ambulatory Visit: Payer: Self-pay | Admitting: Thoracic Surgery (Cardiothoracic Vascular Surgery)

## 2018-06-08 DIAGNOSIS — R911 Solitary pulmonary nodule: Secondary | ICD-10-CM

## 2018-06-09 ENCOUNTER — Encounter: Payer: Self-pay | Admitting: Thoracic Surgery (Cardiothoracic Vascular Surgery)

## 2018-06-09 ENCOUNTER — Ambulatory Visit (INDEPENDENT_AMBULATORY_CARE_PROVIDER_SITE_OTHER): Payer: Self-pay | Admitting: Thoracic Surgery (Cardiothoracic Vascular Surgery)

## 2018-06-09 ENCOUNTER — Other Ambulatory Visit: Payer: Self-pay

## 2018-06-09 ENCOUNTER — Ambulatory Visit
Admission: RE | Admit: 2018-06-09 | Discharge: 2018-06-09 | Disposition: A | Discharge status: Home / Self Care | Payer: Medicare Other | Source: Ambulatory Visit | Attending: Thoracic Surgery (Cardiothoracic Vascular Surgery) | Admitting: Thoracic Surgery (Cardiothoracic Vascular Surgery)

## 2018-06-09 VITALS — BP 124/80 | HR 74 | Resp 16 | Ht 62.5 in | Wt 139.0 lb

## 2018-06-09 DIAGNOSIS — Z902 Acquired absence of lung [part of]: Secondary | ICD-10-CM

## 2018-06-09 DIAGNOSIS — J90 Pleural effusion, not elsewhere classified: Secondary | ICD-10-CM | POA: Diagnosis not present

## 2018-06-09 DIAGNOSIS — C3412 Malignant neoplasm of upper lobe, left bronchus or lung: Secondary | ICD-10-CM

## 2018-06-09 DIAGNOSIS — R911 Solitary pulmonary nodule: Secondary | ICD-10-CM

## 2018-06-09 NOTE — Progress Notes (Signed)
[address]       [place] [address]             [phone]     HPI: [name] returns for a scheduled follow-up visit  [name] is a 77 year old woman with a history of tobacco abuse, emphysema, hypertension, hyperlipidemia, and osteoarthritis.  She also has a history of a cardiac conduction disorder.  She was first found to have a lung nodule in July 2018.  She recently had a follow-up CT which showed no change in her right upper lobe nodule, but there was significant enlargement of the left upper lobe nodule.  I did a thoracoscopic left upper lobectomy and node dissection on 05/13/2018.  She had some atrial fibrillation postoperatively but converted to sinus rhythm.  She went home on postoperative day #9.  She feels well.  She is not having any shortness of breath.  She never filled her prescription for pain medication.  She took Tylenol for a couple of days, but has not had to use that recently.  She is anxious to increase her activities.  Past Medical History:  Diagnosis Date  . Anxiety   . Blood glucose elevated 05/27/2012   pt. states that it has only been slightly high  . BP (high blood pressure) 12/03/2011  . Cancer ([place])    skin cancer  . 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 CN (constipation) 12/06/2015  . 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 GERD (gastroesophageal reflux disease)   . Hemorrhoid 12/06/2015  . History of hiatal hernia   . HLD (hyperlipidemia) 12/03/2011  . Hypertension   . Osteopenia 05/30/2015   Overview:  Bone density 05/2015 started fosamax   . Primary localized osteoarthritis of left knee   . Primary localized osteoarthritis of right knee 12/06/2015    Current Outpatient Medications    Medication Sig Dispense Refill  . aspirin EC 81 MG tablet Take 81 mg by mouth every other day.     . diphenhydramine-acetaminophen (TYLENOL PM) 25-500 MG TABS tablet Take 2 tablets by mouth at bedtime as needed (for sleep).    Marland Kitchen guaiFENesin (MUCINEX) 600 MG 12 hr tablet Take 2 tablets (1,200 mg total) by mouth 2 (two) times daily as needed for cough or to loosen phlegm.    . metoprolol succinate (TOPROL-XL) 50 MG 24 hr tablet Take 50 mg by mouth daily. Take with or immediately following a meal.    . Multiple Vitamins-Minerals (MULTIVITAMIN PO) Take 1 tablet by mouth daily.    . polyethylene glycol (MIRALAX / GLYCOLAX) packet Take 8.5 g by mouth every other day.    . simvastatin (ZOCOR) 20 MG tablet Take 20 mg by mouth daily.     No current facility-administered medications for this visit.     Physical Exam BP 124/80 (BP Location: Right Arm, Patient Position: Sitting, Cuff Size: Normal)   Pulse 74   Resp 16   Ht 5' 2.5" (1.588 m)   Wt 139 lb (63 kg)   SpO2 100%   BMI 25.71 kg/m  77 year old woman in no acute distress Alert and oriented x3 with no focal deficits Lungs diminished at left base, otherwise clear Incisions well-healed Cardiac regular rate and  rhythm  Diagnostic Tests: CHEST - 2 VIEW  COMPARISON:  05/22/2018  FINDINGS: Postoperative changes on the left. Decreasing subcutaneous emphysema. No visible residual pneumothorax. Left lower lung atelectasis. Right lung clear. Heart is normal size. Suspect small left effusion.  IMPRESSION: Postoperative changes on the left with no visible residual pneumothorax and decreasing left subcutaneous air.  Small left effusion with left base atelectasis.   Electronically Signed   By: Rolm Baptise M.D.   On: 06/09/2018 09:13  Impression: 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  She is doing extremely well.  Her exercise tolerance  is good.  He is not having any incisional pain.  She never required narcotics after discharge.  She did have postoperative atrial fibrillation and has a history of some cardiac conduction abnormalities.  She currently has a 30-day monitor on and will follow up with Dr. Irish Lack regarding that.  Her activities are unrestricted.  She may drive.  I am going to refer her to our multidisciplinary thoracic oncology clinic to see Dr. Julien Nordmann.  She will not need adjuvant treatment given that she has stage IA disease.  Plan: [place] Return in 2 months with PA and lateral chest x-ray   Melrose Nakayama, MD [facility] [phone]

## 2018-06-13 DIAGNOSIS — Z23 Encounter for immunization: Secondary | ICD-10-CM | POA: Diagnosis not present

## 2018-06-15 ENCOUNTER — Telehealth: Payer: Self-pay | Admitting: *Deleted

## 2018-06-15 MED ORDER — APIXABAN 5 MG PO TABS: 5.0000 mg | ORAL_TABLET | Freq: Two times a day (BID) | ORAL | 0 refills | Status: DC

## 2018-06-15 MED ORDER — APIXABAN 5 MG PO TABS: 5.0000 mg | ORAL_TABLET | Freq: Two times a day (BID) | ORAL | 6 refills | Status: DC

## 2018-06-15 NOTE — Telephone Encounter (Addendum)
Received [employer] report from 10/5 11:00 am showing Afib, rate 176. Pt reports she thinks she was cooking at that time, but reports no symptoms. Reviewed w/ Dr. Irish Lack -- orders received to start Eliquis 5 mg BID, stop ASA.   Rx sent to pharmacy.  81mo of samples left at front desk.  30 day free card also given to pt. Advised pt of recommendations.  Pt is agreeable to plan. Will forward to Dr. Hassell Done nurse for follow up advisement.  (I didn't see an opening on his schedule).  Monitor will be completed around 10/23.  Pt states she is planning a trip to [place] and will be out of town 11/4 - 11/18.  She understands someone will call her to arrange an office visit.

## 2018-06-15 NOTE — Telephone Encounter (Signed)
Left message for patient to call back  

## 2018-06-16 NOTE — Telephone Encounter (Signed)
Left message for patient to call back  

## 2018-06-18 NOTE — Telephone Encounter (Signed)
Left message for patient to call back  

## 2018-06-19 ENCOUNTER — Telehealth: Payer: Self-pay | Admitting: Internal Medicine

## 2018-06-19 ENCOUNTER — Encounter: Payer: Self-pay | Admitting: Internal Medicine

## 2018-06-19 ENCOUNTER — Encounter: Payer: Self-pay | Admitting: *Deleted

## 2018-06-19 NOTE — Progress Notes (Signed)
Oncology Nurse Navigator Documentation  Oncology Nurse Navigator Flowsheets 06/19/2018  Navigator Location [facility]  Navigator Encounter Type Other/recieved referral on Ms[address].  I updated new patient coordinator to call and schedule patient to be seen with Dr. Julien Nordmann on 07/06/18.   Barriers/Navigation Needs Coordination of Care  Interventions Coordination of Care  Acuity Level 2  Time Spent with Patient 15

## 2018-06-19 NOTE — Telephone Encounter (Signed)
New referral received from Dr. Roxan Hockey for lung cancer. Pt has been cld and scheduled to see Dr. Julien Nordmann on 10/28 at 215pm. Pt aware to arrive 30 minutes early for 145pm labs. Letter mailed.

## 2018-06-22 ENCOUNTER — Telehealth: Payer: Self-pay

## 2018-06-22 NOTE — Telephone Encounter (Signed)
**Note De-Identified Rogelio Winbush Obfuscation** It is unclear who did this tier exception but we received a letter Ebunoluwa Gernert fax from [facility] stating that they have denied a tier exception for Eliquis. Reason for denial: Under Medicare rules once a brand name medication has been lowered to a tier 3 it cannot be lowered any further so a tier exception on Eliquis is denied.  I have notified the pts pharmacy.

## 2018-06-25 NOTE — Telephone Encounter (Signed)
Left message for patient to call back  

## 2018-06-29 NOTE — Telephone Encounter (Signed)
Patient calling back. Appointment made for 07/08/18 at 4:00 PM.

## 2018-06-30 ENCOUNTER — Telehealth: Payer: Self-pay

## 2018-06-30 NOTE — Telephone Encounter (Signed)
**Note De-Identified [name] Obfuscation** I attempted a non formulary PA through covermymeds. Key: [id number]   I received the following message: [name] Key: [facility]  Outcome  Available without authorization.  I called the pts pharmacy ([facility]) and was advised that the cost of Eliquis to the pt for a 30 day supply is $366.00.  See phone note from 10/14 stating that the pt was denied a tier exception for Eliquis because the pt has Medicare PART D and under PART D guidelines a name brand medications cannot be lowered in cost once it has been lowered to a tier 3.  A PA and tier exception for Eliquis has both been denied.  Please advise as the pt cannot afford Eliquis.

## 2018-06-30 NOTE — Telephone Encounter (Signed)
**Note De-identified Lynetta Tomczak Obfuscation** -----  **Note De-Identified Vu Liebman Obfuscation** Message from Cleon Gustin, RN sent at 06/29/2018  1:07 PM EDT ----- Regarding: Eliquis Can you look again to see if there is anything else we can do to help with cost of Eliquis?  Thanks

## 2018-07-01 NOTE — Telephone Encounter (Signed)
If Xarelto less expesive, that is fine.  Otherwise COumadin.

## 2018-07-03 ENCOUNTER — Other Ambulatory Visit: Payer: Self-pay | Admitting: *Deleted

## 2018-07-03 DIAGNOSIS — R911 Solitary pulmonary nodule: Secondary | ICD-10-CM

## 2018-07-03 MED ORDER — RIVAROXABAN 20 MG PO TABS: 20.0000 mg | ORAL_TABLET | Freq: Every day | ORAL | 3 refills | Status: DC

## 2018-07-03 NOTE — Telephone Encounter (Signed)
**Note De-Identified Aquilla Voiles Obfuscation** I called [facility] and s/w the pharmacist Christy who advised me that the pts deductible has not been met this year and that the pts co-pay is $367.49 for a 30 day supply of Xarelto and is $366.00 for a 30 day supply of Eliquis.  I have called and left a message on the pts VM asking her to call me back.

## 2018-07-03 NOTE — Telephone Encounter (Signed)
I have discussed with Dr Ave Filter and will see if Xarelto 20 mg is covered under the pts insurance and if not I will then try Coumadin.

## 2018-07-06 ENCOUNTER — Inpatient Hospital Stay: Payer: Medicare Other | Attending: Internal Medicine | Admitting: Internal Medicine

## 2018-07-06 ENCOUNTER — Encounter: Payer: Self-pay | Admitting: Internal Medicine

## 2018-07-06 ENCOUNTER — Telehealth: Payer: Self-pay | Admitting: Internal Medicine

## 2018-07-06 ENCOUNTER — Inpatient Hospital Stay: Payer: Medicare Other

## 2018-07-06 DIAGNOSIS — C3492 Malignant neoplasm of unspecified part of left bronchus or lung: Secondary | ICD-10-CM

## 2018-07-06 DIAGNOSIS — Z8042 Family history of malignant neoplasm of prostate: Secondary | ICD-10-CM | POA: Insufficient documentation

## 2018-07-06 DIAGNOSIS — F17210 Nicotine dependence, cigarettes, uncomplicated: Secondary | ICD-10-CM | POA: Insufficient documentation

## 2018-07-06 DIAGNOSIS — C3412 Malignant neoplasm of upper lobe, left bronchus or lung: Secondary | ICD-10-CM

## 2018-07-06 DIAGNOSIS — Z803 Family history of malignant neoplasm of breast: Secondary | ICD-10-CM | POA: Diagnosis not present

## 2018-07-06 DIAGNOSIS — R911 Solitary pulmonary nodule: Secondary | ICD-10-CM

## 2018-07-06 DIAGNOSIS — C3490 Malignant neoplasm of unspecified part of unspecified bronchus or lung: Secondary | ICD-10-CM

## 2018-07-06 LAB — CBC WITH DIFFERENTIAL (CANCER CENTER ONLY)
Abs Immature Granulocytes: 0.02 10*3/uL (ref 0.00–0.07)
BASOS ABS: 0 10*3/uL (ref 0.0–0.1)
BASOS PCT: 0 %
EOS ABS: 0.1 10*3/uL (ref 0.0–0.5)
EOS PCT: 1 %
HEMATOCRIT: 42.7 % (ref 36.0–46.0)
Hemoglobin: 14.1 g/dL (ref 12.0–15.0)
IMMATURE GRANULOCYTES: 0 %
Lymphocytes Relative: 21 %
Lymphs Abs: 1.7 10*3/uL (ref 0.7–4.0)
MCH: 32.4 pg (ref 26.0–34.0)
MCHC: 33 g/dL (ref 30.0–36.0)
MCV: 98.2 fL (ref 80.0–100.0)
MONOS PCT: 6 %
Monocytes Absolute: 0.5 10*3/uL (ref 0.1–1.0)
NEUTROS PCT: 72 %
NRBC: 0 % (ref 0.0–0.2)
Neutro Abs: 5.9 10*3/uL (ref 1.7–7.7)
PLATELETS: 235 10*3/uL (ref 150–400)
RBC: 4.35 MIL/uL (ref 3.87–5.11)
RDW: 12.1 % (ref 11.5–15.5)
WBC Count: 8.1 10*3/uL (ref 4.0–10.5)

## 2018-07-06 LAB — CMP (CANCER CENTER ONLY)
ALK PHOS: 77 U/L (ref 38–126)
ALT: 9 U/L (ref 0–44)
ANION GAP: 5 (ref 5–15)
AST: 16 U/L (ref 15–41)
Albumin: 3.8 g/dL (ref 3.5–5.0)
BILIRUBIN TOTAL: 0.5 mg/dL (ref 0.3–1.2)
BUN: 12 mg/dL (ref 8–23)
CALCIUM: 10.1 mg/dL (ref 8.9–10.3)
CO2: 28 mmol/L (ref 22–32)
CREATININE: 0.96 mg/dL (ref 0.44–1.00)
Chloride: 104 mmol/L (ref 98–111)
GFR, Estimated: 55 mL/min — ABNORMAL LOW (ref >60)
Glucose, Bld: 111 mg/dL — ABNORMAL HIGH (ref 70–99)
Potassium: 4.3 mmol/L (ref 3.5–5.1)
Sodium: 137 mmol/L (ref 135–145)
TOTAL PROTEIN: 6.6 g/dL (ref 6.5–8.1)

## 2018-07-06 NOTE — Progress Notes (Signed)
[name] [phone]   [phone]  CONSULT NOTE  REFERRING PHYSICIAN: Dr. Modesto Charon  REASON FOR CONSULTATION:  77 years old white female recently diagnosed with lung cancer.  HPI [name] is a 77 y.o. female with past medical history significant for hypertension, GERD, osteoarthritis, dyslipidemia, diverticulosis, anxiety, skin cancer as well as osteoporosis.  The patient also has a long history of smoking.  She was seen by her pulmonologist Dr. Lake Bells in 2017 and because of her smoking history she had CT scan of the chest performed on April 04, 2017 and that showed small solid and groundglass attenuation nodules in the lung bilaterally measuring up to 0.7 cm in the left upper lobe solid nodule and 0.9 cm in the right upper lobe groundglass attenuation nodule.  This was followed by observation and repeat CT scan of the chest on April 06, 2018 showed significant enlargement of a spiculated solid nodule within the central left upper lobe which measured 2.1 cm.  The previously measured 0.9 cm groundglass lesion within the anterior right apex was a stable.  A PET scan on 04/24/2018 showed hypermetabolic 2.2 cm central left upper lobe spiculated nodule consistent with primary bronchogenic carcinoma.  There was a 0.8 cm nodule in the right apex with minimal FDG activity.  Low-grade bronchogenic carcinoma cannot be excluded due to the small size of this nodule.  There was no evidence of thoracic lymph node metastasis or distant metastatic disease. On 05/13/2018 the patient underwent left VATS with thoracoscopic left upper lobectomy and mediastinal lymph node dissection under the care of Dr. Roxan Hockey.  The final pathology [id number]) showed poorly differentiated adenocarcinoma invading the cartilage of the bronchus but the final margins were uninvolved with by invasive carcinoma.  The tumor measured 2.3 cm.  There was evidence for lymphovascular  space invasion but no visceral pleural invasion.  The dissected lymph nodes were negative for malignancy. Her surgery was complicated with subcutaneous emphysema which was later resolved.  Her Roxan Hockey kindly referred the patient to me today for evaluation and recommendation regarding adjuvant therapy. When seen today the patient is feeling fine with no concerning complaints.  She denied having any chest pain, shortness of breath, cough or hemoptysis.  She denied having any recent weight loss or night sweats.  She has no nausea, vomiting, diarrhea or constipation.  She has no headache or visual changes. Family history significant for mother with congestive heart failure, father with prostate cancer, sister had breast cancer. The patient is married and has 2 sons.  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  She has a history of smoking 1 pack/day for around 63 years and unfortunately she continues to smoke few cigarettes every day.  She drinks alcohol occasionally and no history of drug abuse.  HPI  Past Medical History:  Diagnosis Date  . Anxiety   . Blood glucose elevated 05/27/2012   pt. states that it has only been slightly high  . BP (high blood pressure) 12/03/2011  . Cancer ([place])    skin cancer  . 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 CN (constipation) 12/06/2015  . 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 GERD (  gastroesophageal reflux disease)   . Hemorrhoid 12/06/2015  . History of hiatal hernia   . HLD (hyperlipidemia) 12/03/2011  . Hypertension   . Osteopenia 05/30/2015   Overview:  Bone density 05/2015 started fosamax   . Primary localized osteoarthritis of left knee   . Primary localized osteoarthritis of  right knee 12/06/2015    Past Surgical History:  Procedure Laterality Date  . ABDOMINAL HYSTERECTOMY    . CATARACT EXTRACTION, BILATERAL    . COLONOSCOPY    . EYE SURGERY Bilateral    Cataract  . KNEE SURGERY  05/01/2016   left uncompartmental   . PARTIAL KNEE ARTHROPLASTY Right 12/18/2015   Procedure: UNICOMPARTMENTAL RIGHT KNEE;  Surgeon: Elsie Saas, MD;  Location: [facility];  Service: Orthopedics;  Laterality: Right;  . PARTIAL KNEE ARTHROPLASTY Left 05/01/2016   Procedure: UNICOMPARTMENTAL KNEE;  Surgeon: Elsie Saas, MD;  Location: [facility];  Service: Orthopedics;  Laterality: Left;  . TOTAL ABDOMINAL HYSTERECTOMY W/ BILATERAL SALPINGOOPHORECTOMY    . VIDEO ASSISTED THORACOSCOPY (VATS)/ LOBECTOMY Left 05/13/2018   Procedure: VIDEO ASSISTED THORACOSCOPY (VATS)/LEFT UPPER LOBECTOMY;  Surgeon: Melrose Nakayama, MD;  Location: [facility] OR;  Service: Thoracic;  Laterality: Left;    Family History  Problem Relation Age of Onset  . Congestive Heart Failure Mother     Social History Social History   Tobacco Use  . Smoking status: Current Every Day Smoker    Years: 50.00    Types: Cigarettes  . Smokeless tobacco: Never Used  . Tobacco comment: only one cigarette occasionally  Substance Use Topics  . Alcohol use: Yes    Comment: social rare  . Drug use: No    Allergies  Allergen Reactions  . Ambien [Zolpidem Tartrate] Other (See Comments)    ARRYTHMIA  . Sulfa Antibiotics Other (See Comments)    UNSPECIFIED, childhood reaction  . Codeine Hives and Rash  . Hydrocodone Other (See Comments)    FATIGUE  . Meperidine Nausea And Vomiting    Current Outpatient Medications  Medication Sig Dispense Refill  . diphenhydramine-acetaminophen (TYLENOL PM) 25-500 MG TABS tablet Take 2 tablets by mouth at bedtime as needed (for sleep).    Marland Kitchen guaiFENesin (MUCINEX) 600 MG 12 hr tablet Take 2 tablets (1,200 mg total) by mouth 2 (two) times daily as needed for cough or to loosen phlegm.    .  metoprolol succinate (TOPROL-XL) 50 MG 24 hr tablet Take 50 mg by mouth daily. Take with or immediately following a meal.    . Multiple Vitamins-Minerals (MULTIVITAMIN PO) Take 1 tablet by mouth daily.    . polyethylene glycol (MIRALAX / GLYCOLAX) packet Take 8.5 g by mouth every other day.    . rivaroxaban (XARELTO) 20 MG TABS tablet Take 1 tablet (20 mg total) by mouth daily with supper. 30 tablet 3  . simvastatin (ZOCOR) 20 MG tablet Take 20 mg by mouth daily.     No current facility-administered medications for this visit.     Review of Systems  Constitutional: negative Eyes: negative Ears, nose, mouth, throat, and face: negative Respiratory: negative Cardiovascular: negative Gastrointestinal: negative Genitourinary:negative Integument/breast: negative Hematologic/lymphatic: negative Musculoskeletal:negative Neurological: negative Behavioral/Psych: negative Endocrine: negative Allergic/Immunologic: negative  Physical Exam  WPY:KDXIP, healthy, no distress, well nourished and well developed SKIN: skin color, texture, turgor are normal, no rashes or significant lesions HEAD: Normocephalic, No masses, lesions, tenderness or abnormalities EYES: normal, PERRLA, Conjunctiva are pink and non-injected EARS: External ears normal, Canals clear OROPHARYNX:no exudate, no erythema and lips, buccal  mucosa, and tongue normal  NECK: supple, no adenopathy, no JVD LYMPH:  no palpable lymphadenopathy, no hepatosplenomegaly BREAST:not examined LUNGS: clear to auscultation , and palpation HEART: regular rate & rhythm, no murmurs and no gallops ABDOMEN:abdomen soft, non-tender, normal bowel sounds and no masses or organomegaly BACK: No CVA tenderness, Range of motion is normal EXTREMITIES:no joint deformities, effusion, or inflammation, no edema  NEURO: alert & oriented x 3 with fluent speech, no focal motor/sensory deficits  PERFORMANCE STATUS: ECOG 1  LABORATORY DATA: Lab Results    Component Value Date   WBC 8.1 07/06/2018   HGB 14.1 07/06/2018   HCT 42.7 07/06/2018   MCV 98.2 07/06/2018   PLT 235 07/06/2018      Chemistry      Component Value Date/Time   NA 130 (L) 05/20/2018 0819   K 4.2 05/20/2018 0819   CL 96 (L) 05/20/2018 0819   CO2 24 05/20/2018 0819   BUN 12 05/20/2018 0819   CREATININE 0.75 05/20/2018 0819      Component Value Date/Time   CALCIUM 8.8 (L) 05/20/2018 0819   ALKPHOS 48 05/15/2018 0400   AST 22 05/15/2018 0400   ALT 9 05/15/2018 0400   BILITOT 0.9 05/15/2018 0400       RADIOGRAPHIC STUDIES: Dg Chest 2 View  Result Date: 06/09/2018 CLINICAL DATA:  Left upper lobe nodule, post VATS and left upper lobectomy. EXAM: CHEST - 2 VIEW COMPARISON:  05/22/2018 FINDINGS: Postoperative changes on the left. Decreasing subcutaneous emphysema. No visible residual pneumothorax. Left lower lung atelectasis. Right lung clear. Heart is normal size. Suspect small left effusion. IMPRESSION: Postoperative changes on the left with no visible residual pneumothorax and decreasing left subcutaneous air. Small left effusion with left base atelectasis. Electronically Signed   By: Rolm Baptise M.D.   On: 06/09/2018 09:13    ASSESSMENT: This is a very pleasant 77 years old white female recently diagnosed with a stage IA (T1c, N0, M0) non-small cell lung cancer, adenocarcinoma with lymphovascular invasion status post left upper lobectomy with lymph node dissection in September 2019 under the care of Dr. Roxan Hockey   PLAN: I had a lengthy discussion with the patient today about her current disease stage, prognosis and treatment options. I personally and independently reviewed her previous scan imaging and pathology report. I explained to the patient that she had curable treatment for her condition with the surgical resection. I also explained to the patient that there is no survival benefit for adjuvant systemic chemotherapy or radiation for patient with a stage  IA non-small cell lung cancer. I recommended for the patient to continue on observation. I will see her back for follow-up visit in 6 months for evaluation with repeat CT scan of the chest for restaging of her disease.  She will need close monitoring of the right upper lobe lung groundglass opacity The patient was advised to call immediately if she has any concerning symptoms in the interval. The patient voices understanding of current disease status and treatment options and is in agreement with the current care plan. All questions were answered. The patient knows to call the clinic with any problems, questions or concerns. We can certainly see the patient much sooner if necessary.  Thank you so much for allowing me to participate in the care of Farrell Ours. I will continue to follow up the patient with you and assist in her care.  I spent 40 minutes counseling the patient face to face. The total time spent in  the appointment was 60 minutes.  Disclaimer: This note was dictated with voice recognition software. Similar sounding words can inadvertently be transcribed and may not be corrected upon review.   Eilleen Kempf July 06, 2018, 2:16 PM

## 2018-07-06 NOTE — Patient Instructions (Signed)
Steps to Quit Smoking Smoking tobacco can be bad for your health. It can also affect almost every organ in your body. Smoking puts you and people around you at risk for many serious long-lasting (chronic) diseases. Quitting smoking is hard, but it is one of the best things that you can do for your health. It is never too late to quit. What are the benefits of quitting smoking? When you quit smoking, you lower your risk for getting serious diseases and conditions. They can include:  Lung cancer or lung disease.  Heart disease.  Stroke.  Heart attack.  Not being able to have children (infertility).  Weak bones (osteoporosis) and broken bones (fractures).  If you have coughing, wheezing, and shortness of breath, those symptoms may get better when you quit. You may also get sick less often. If you are pregnant, quitting smoking can help to lower your chances of having a baby of low birth weight. What can I do to help me quit smoking? Talk with your doctor about what can help you quit smoking. Some things you can do (strategies) include:  Quitting smoking totally, instead of slowly cutting back how much you smoke over a period of time.  Going to in-person counseling. You are more likely to quit if you go to many counseling sessions.  Using resources and support systems, such as: ? Online chats with a counselor. ? Phone quitlines. ? Printed self-help materials. ? Support groups or group counseling. ? Text messaging programs. ? Mobile phone apps or applications.  Taking medicines. Some of these medicines may have nicotine in them. If you are pregnant or breastfeeding, do not take any medicines to quit smoking unless your doctor says it is okay. Talk with your doctor about counseling or other things that can help you.  Talk with your doctor about using more than one strategy at the same time, such as taking medicines while you are also going to in-person counseling. This can help make  quitting easier. What things can I do to make it easier to quit? Quitting smoking might feel very hard at first, but there is a lot that you can do to make it easier. Take these steps:  Talk to your family and friends. Ask them to support and encourage you.  Call phone quitlines, reach out to support groups, or work with a counselor.  Ask people who smoke to not smoke around you.  Avoid places that make you want (trigger) to smoke, such as: ? Bars. ? Parties. ? Smoke-break areas at work.  Spend time with people who do not smoke.  Lower the stress in your life. Stress can make you want to smoke. Try these things to help your stress: ? Getting regular exercise. ? Deep-breathing exercises. ? Yoga. ? Meditating. ? Doing a body scan. To do this, close your eyes, focus on one area of your body at a time from head to toe, and notice which parts of your body are tense. Try to relax the muscles in those areas.  Download or buy apps on your mobile phone or tablet that can help you stick to your quit plan. There are many free apps, such as QuitGuide from the CDC (Centers for Disease Control and Prevention). You can find more support from smokefree.gov and other websites.  This information is not intended to replace advice given to you by your health care provider. Make sure you discuss any questions you have with your health care provider. Document Released: 06/22/2009 Document   Revised: 04/23/2016 Document Reviewed: 01/10/2015 Elsevier Interactive Patient Education  2018 Elsevier Inc.  

## 2018-07-06 NOTE — Telephone Encounter (Signed)
Gave pt avs and calendar  °

## 2018-07-06 NOTE — Telephone Encounter (Signed)
**Note De-Identified [name] Obfuscation** I left a message on the pts VM asking her to call me back.

## 2018-07-08 ENCOUNTER — Encounter: Payer: Self-pay | Admitting: Interventional Cardiology

## 2018-07-08 ENCOUNTER — Ambulatory Visit (INDEPENDENT_AMBULATORY_CARE_PROVIDER_SITE_OTHER): Payer: Medicare Other | Admitting: Interventional Cardiology

## 2018-07-08 VITALS — BP 126/74 | HR 85 | Ht 62.0 in | Wt 139.2 lb

## 2018-07-08 DIAGNOSIS — Z7901 Long term (current) use of anticoagulants: Secondary | ICD-10-CM | POA: Diagnosis not present

## 2018-07-08 DIAGNOSIS — E782 Mixed hyperlipidemia: Secondary | ICD-10-CM | POA: Diagnosis not present

## 2018-07-08 DIAGNOSIS — I480 Paroxysmal atrial fibrillation: Secondary | ICD-10-CM | POA: Diagnosis not present

## 2018-07-08 NOTE — Progress Notes (Signed)
Cardiology Office Note   Date:  07/08/2018   ID:  [name], DOB 31-May-1941, MRN [id number]  PCP:  [name], MD    No chief complaint on file.  AFib  Wt Readings from Last 3 Encounters:  07/08/18 139 lb 3.2 oz (63.1 kg)  07/06/18 137 lb 6.4 oz (62.3 kg)  06/09/18 139 lb (63 kg)       History of Present Illness: [name] is a 77 y.o. female who had thoracic surgery several weeks ago.  She had postoperative atrial fibrillation.  She spontaneously converted to normal sinus rhythm.  She then underwent monitoring.  There was a question of a short run of atrial fibrillation versus nonsustained ventricular tachycardia.  We then placed her on Eliquis at that point.  For the remainder of the monitor which lasted 30 days, she had no further atrial fibrillation.  She was asymptomatic with her atrial fibrillation in the hospital.  She was asymptomatic while wearing her monitor.  Today, she feels well.  She is tolerating Eliquis well.  No bleeding problems.  Denies : Chest pain. Dizziness. Leg edema. Nitroglycerin use. Orthopnea. Palpitations. Paroxysmal nocturnal dyspnea. Shortness of breath. Syncope.      Past Medical History:  Diagnosis Date  . Anxiety   . Blood glucose elevated 05/27/2012   pt. states that it has only been slightly high  . BP (high blood pressure) 12/03/2011  . Cancer ([place])    skin cancer  . 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 CN (constipation) 12/06/2015  . 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 GERD (gastroesophageal reflux disease)   . Hemorrhoid 12/06/2015  . History of hiatal hernia   . HLD (hyperlipidemia) 12/03/2011  . Hypertension   . Osteopenia 05/30/2015   Overview:  Bone density 05/2015  started fosamax   . Primary localized osteoarthritis of left knee   . Primary localized osteoarthritis of right knee 12/06/2015    Past Surgical History:  Procedure Laterality Date  . ABDOMINAL HYSTERECTOMY    . CATARACT EXTRACTION, BILATERAL    . COLONOSCOPY    . EYE SURGERY Bilateral    Cataract  . KNEE SURGERY  05/01/2016   left uncompartmental   . PARTIAL KNEE ARTHROPLASTY Right 12/18/2015   Procedure: UNICOMPARTMENTAL RIGHT KNEE;  Surgeon: Elsie Saas, MD;  Location: [facility];  Service: Orthopedics;  Laterality: Right;  . PARTIAL KNEE ARTHROPLASTY Left 05/01/2016   Procedure: UNICOMPARTMENTAL KNEE;  Surgeon: Elsie Saas, MD;  Location: [facility];  Service: Orthopedics;  Laterality: Left;  . TOTAL ABDOMINAL HYSTERECTOMY W/ BILATERAL SALPINGOOPHORECTOMY    . VIDEO ASSISTED THORACOSCOPY (VATS)/ LOBECTOMY Left 05/13/2018   Procedure: VIDEO ASSISTED THORACOSCOPY (VATS)/LEFT UPPER LOBECTOMY;  Surgeon: Melrose Nakayama, MD;  [facility] OR;  Service: Thoracic;  Laterality: Left;     Current Outpatient Medications  Medication Sig Dispense Refill  . apixaban (ELIQUIS) 5 MG TABS tablet Take 5 mg by mouth 2 (two) times daily.    . diphenhydramine-acetaminophen (TYLENOL PM) 25-500 MG TABS tablet Take 2 tablets by mouth at bedtime as needed (for sleep).    Marland Kitchen guaiFENesin (MUCINEX) 600 MG 12 hr tablet Take 2 tablets (1,200 mg total) by mouth 2 (two) times daily as needed  for cough or to loosen phlegm.    . metoprolol succinate (TOPROL-XL) 50 MG 24 hr tablet Take 50 mg by mouth daily. Take with or immediately following a meal.    . Multiple Vitamins-Minerals (MULTIVITAMIN PO) Take 1 tablet by mouth daily.    . polyethylene glycol (MIRALAX / GLYCOLAX) packet Take 8.5 g by mouth every other day.    . simvastatin (ZOCOR) 20 MG tablet Take 20 mg by mouth daily.     No current facility-administered medications for this visit.     Allergies:   Ambien [zolpidem tartrate]; Sulfa antibiotics; Codeine;  Hydrocodone; and Meperidine    Social History:  The patient  reports that she has been smoking cigarettes. She has smoked for the past 50.00 years. She has never used smokeless tobacco. She reports that she drinks alcohol. She reports that she does not use drugs.   Family History:  The patient's family history includes Congestive Heart Failure in her mother.    ROS:  Please see the history of present illness.   Otherwise, review of systems are positive for getting strength back.   All other systems are reviewed and negative.    PHYSICAL EXAM: VS:  BP 126/74   Pulse 85   Ht 5\' 2"  (1.575 m)   Wt 139 lb 3.2 oz (63.1 kg)   SpO2 99%   BMI 25.46 kg/m  , BMI Body mass index is 25.46 kg/m. GEN: Well nourished, well developed, in no acute distress  HEENT: normal  Neck: no JVD, carotid bruits, or masses Cardiac: RRR; no murmurs, rubs, or gallops,no edema  Respiratory:  clear to auscultation bilaterally, normal work of breathing GI: soft, nontender, nondistended, + BS MS: no deformity or atrophy  Skin: warm and dry, no rash Neuro:  Strength and sensation are intact Psych: euthymic mood, full affect    Recent Labs: 05/20/2018: Magnesium 2.0 05/21/2018: TSH 1.541 07/06/2018: ALT 9; BUN 12; Creatinine 0.96; Hemoglobin 14.1; Platelet Count 235; Potassium 4.3; Sodium 137   Lipid Panel No results found for: CHOL, TRIG, HDL, CHOLHDL, VLDL, LDLCALC, LDLDIRECT   Other studies Reviewed: Additional studies/ records that were reviewed today with results demonstrating: Hospital records reviewed.   ASSESSMENT AND PLAN:  1. Atrial fibrillation: One episode postoperatively which was definitive.  This occurred in the hospital.  Elevated chads Vascor given her age.  However, she has not had significant burden of atrial fibrillation while wearing a 30-day monitor.  Will discuss with the EP whether implantable loop recorder would be a good option here.  Unfortunately, Eliquis or Xarelto will be very  expensive for her.  If she did not have a significant burden of atrial fibrillation, could consider using aspirin only.  For now we will continue Eliquis until we get EP input regarding the feasibility of an ILR. 2. Hyperlipidemia: Continue simvastatin.  LDL checked with primary care doctor. This patients CHA2DS2-VASc Score and unadjusted Ischemic Stroke Rate (% per year) is equal to 3.2 % stroke rate/year from a score of 3  Above score calculated as 1 point each if present [CHF, HTN, DM, Vascular=MI/PAD/Aortic Plaque, Age if 65-74, or Female] Above score calculated as 2 points each if present [Age > 75, or Stroke/TIA/TE]  3.  4.    Current medicines are reviewed at length with the patient today.  The patient concerns regarding her medicines were addressed.  The following changes have been made:  No change  Labs/ tests ordered today include:  No orders of the defined types  were placed in this encounter.   Recommend 150 minutes/week of aerobic exercise Low fat, low carb, high fiber diet recommended  Disposition:   FU in 3 months   Signed, Larae Grooms, MD  07/08/2018 4:33 PM    [facility] [place], [place],   [address] Phone: [phone]; Fax: [phone]

## 2018-07-08 NOTE — Patient Instructions (Addendum)
Medication Instructions:  Your physician recommends that you continue on your current medications as directed. Please refer to the Current Medication list given to you today.  If you need a refill on your cardiac medications before your next appointment, please call your pharmacy.   Lab work: None ordered  If you have labs (blood work) drawn today and your tests are completely normal, you will receive your results only by: Marland Kitchen MyChart Message (if you have MyChart) OR . A paper copy in the mail If you have any lab test that is abnormal or we need to change your treatment, we will call you to review the results.  Testing/Procedures: None ordered  Follow-Up: . You will need a follow up appointment in 3 months with Casandra Doffing, MD  Any Other Special Instructions Will Be Listed Below (If Applicable).

## 2018-07-13 NOTE — Telephone Encounter (Signed)
**Note De-Identified [name] Obfuscation** I left another message on the pts VM asking her to call us back.

## 2018-07-15 DIAGNOSIS — H0011 Chalazion right upper eyelid: Secondary | ICD-10-CM | POA: Diagnosis not present

## 2018-07-21 ENCOUNTER — Telehealth: Payer: Self-pay

## 2018-07-21 NOTE — Telephone Encounter (Signed)
-----   Message from Jettie Booze, MD sent at 07/20/2018  6:41 PM EST ----- Let's plan on stopping Eliquis 3 months after her surgery and then sending her to EP to discuss about ILR.  Thanks.  JV ----- Message ----- From: Thompson Grayer, MD Sent: 07/19/2018   3:33 PM EST To: Jettie Booze, MD  I think its really up to her. If she has only had sustained afib post operatively and only nonsustained atrial arrhythmias lasting < 30 seconds on her monitor, I think it would be ok to stop anticoagulation 3 months post op and resume if she has further sustained afib.  Another option is to implant a loop recorder for afib management and let that guide anticoagulation long term.    I would support either decision made by the patient... Im happy to see her if she and you would like for me to.  Thanks! JA  ----- Message ----- From: Jettie Booze, MD Sent: 07/08/2018   5:29 PM EST To: Thompson Grayer, MD  Glennie Hawk had post op AFib after thoracic surgery.  Quesiton of brief run of AFib vs. NSVT on monitor.  Other 99.9 % of time was NSR.  WOud she be an ILR candidate, OR just anticoagulate OR, just don't anticoagulate.  CHADS-vasc is 3.  Thanks.  Also DOAC are very expensive for her.  My inclination was ILR AND hold Eliquis based on results of ILR. THanks.

## 2018-07-21 NOTE — Telephone Encounter (Signed)
Left message for patient to call back  

## 2018-07-23 NOTE — Telephone Encounter (Signed)
Left message for patient to call back  

## 2018-07-30 NOTE — Telephone Encounter (Signed)
Left message for patient to call back  

## 2018-08-04 NOTE — Telephone Encounter (Signed)
Left message for patient to call back  

## 2018-08-05 DIAGNOSIS — D225 Melanocytic nevi of trunk: Secondary | ICD-10-CM | POA: Diagnosis not present

## 2018-08-05 DIAGNOSIS — Z85828 Personal history of other malignant neoplasm of skin: Secondary | ICD-10-CM | POA: Diagnosis not present

## 2018-08-05 DIAGNOSIS — L821 Other seborrheic keratosis: Secondary | ICD-10-CM | POA: Diagnosis not present

## 2018-08-05 DIAGNOSIS — L720 Epidermal cyst: Secondary | ICD-10-CM | POA: Diagnosis not present

## 2018-08-05 DIAGNOSIS — L814 Other melanin hyperpigmentation: Secondary | ICD-10-CM | POA: Diagnosis not present

## 2018-08-05 DIAGNOSIS — D1801 Hemangioma of skin and subcutaneous tissue: Secondary | ICD-10-CM | POA: Diagnosis not present

## 2018-08-05 DIAGNOSIS — D2272 Melanocytic nevi of left lower limb, including hip: Secondary | ICD-10-CM | POA: Diagnosis not present

## 2018-08-10 ENCOUNTER — Other Ambulatory Visit: Payer: Self-pay | Admitting: Thoracic Surgery (Cardiothoracic Vascular Surgery)

## 2018-08-10 DIAGNOSIS — C3492 Malignant neoplasm of unspecified part of left bronchus or lung: Secondary | ICD-10-CM

## 2018-08-11 ENCOUNTER — Other Ambulatory Visit: Payer: Self-pay

## 2018-08-11 ENCOUNTER — Ambulatory Visit (INDEPENDENT_AMBULATORY_CARE_PROVIDER_SITE_OTHER): Payer: Self-pay | Admitting: Thoracic Surgery (Cardiothoracic Vascular Surgery)

## 2018-08-11 ENCOUNTER — Ambulatory Visit
Admission: RE | Admit: 2018-08-11 | Discharge: 2018-08-11 | Disposition: A | Discharge status: Home / Self Care | Payer: Medicare Other | Source: Ambulatory Visit | Attending: Thoracic Surgery (Cardiothoracic Vascular Surgery) | Admitting: Thoracic Surgery (Cardiothoracic Vascular Surgery)

## 2018-08-11 ENCOUNTER — Encounter: Payer: Self-pay | Admitting: Thoracic Surgery (Cardiothoracic Vascular Surgery)

## 2018-08-11 VITALS — BP 130/76 | HR 70 | Resp 16 | Ht 62.0 in | Wt 137.6 lb

## 2018-08-11 DIAGNOSIS — C3492 Malignant neoplasm of unspecified part of left bronchus or lung: Secondary | ICD-10-CM

## 2018-08-11 DIAGNOSIS — C3412 Malignant neoplasm of upper lobe, left bronchus or lung: Secondary | ICD-10-CM

## 2018-08-11 DIAGNOSIS — Z902 Acquired absence of lung [part of]: Secondary | ICD-10-CM

## 2018-08-11 DIAGNOSIS — R918 Other nonspecific abnormal finding of lung field: Secondary | ICD-10-CM | POA: Diagnosis not present

## 2018-08-11 NOTE — Progress Notes (Signed)
[address]       [place] [address]             [phone]    HPI: [name] returns for scheduled postoperative follow-up visit  [name] is a 77 year old woman with history of tobacco abuse, emphysema, hypertension, hyperlipidemia, cardiac conduction disorder, and osteoarthritis.  She was found to have a lung nodule in July 2018.  This was a right upper lobe nodule.  A repeat CT showed no change in the right upper lobe nodule but there was significant enlargement of her left upper lobe nodule.  She underwent a thoracoscopic left upper lobectomy and node dissection on 05/13/2018.  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  She did not require adjuvant therapy.  She had some atrial fibrillation postoperatively but converted to sinus rhythm and was discharged on day 9.  I last saw her in the office on October 1.  She was doing well at that time.  She did not have to fill her pain medication prescription.  She continues to feel well.  She does not have any incisional pain.  Her exercise tolerance is good.  Her appetite is good and her weight is stable. Past Medical History:  Diagnosis Date  . Anxiety   . Blood glucose elevated 05/27/2012   pt. states that it has only been slightly high  . BP (high blood pressure) 12/03/2011  . Cancer ([place])    skin cancer  . 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 CN (constipation) 12/06/2015  . 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 GERD (gastroesophageal reflux disease)   . Hemorrhoid 12/06/2015  . History of hiatal hernia   . HLD (hyperlipidemia) 12/03/2011  . Hypertension   . Osteopenia 05/30/2015   Overview:  Bone density 05/2015 started fosamax   . Primary localized  osteoarthritis of left knee   . Primary localized osteoarthritis of right knee 12/06/2015    Current Outpatient Medications  Medication Sig Dispense Refill  . apixaban (ELIQUIS) 5 MG TABS tablet Take 5 mg by mouth 2 (two) times daily.    . diphenhydramine-acetaminophen (TYLENOL PM) 25-500 MG TABS tablet Take 2 tablets by mouth at bedtime as needed (for sleep).    Marland Kitchen guaiFENesin (MUCINEX) 600 MG 12 hr tablet Take 2 tablets (1,200 mg total) by mouth 2 (two) times daily as needed for cough or to loosen phlegm.    . metoprolol succinate (TOPROL-XL) 50 MG 24 hr tablet Take 50 mg by mouth daily. Take with or immediately following a meal.    . Multiple Vitamins-Minerals (MULTIVITAMIN PO) Take 1 tablet by mouth daily.    . polyethylene glycol (MIRALAX / GLYCOLAX) packet Take 8.5 g by mouth every other day.    . simvastatin (ZOCOR) 20 MG tablet Take 20 mg by mouth daily.     No current facility-administered medications for this visit.     Physical Exam BP 130/76 (BP Location: Left Arm, Patient Position: Sitting, Cuff Size: Large)   Pulse 70   Resp 16   Ht 5\' 2"  (1.575 m)   Wt 137 lb 9.6 oz (62.4 kg)   SpO2 95% Comment: RA  BMI 25.74 kg/m  77 year old woman in  no acute distress Alert and oriented x3 with no focal deficits Lungs slightly diminished at left base, otherwise clear Cardiac regular rate and rhythm normal S1 and S2 Incisions well-healed  Diagnostic Tests: CHEST - 2 VIEW  COMPARISON:  PA and lateral chest x-ray of June 09, 2018  FINDINGS: The right lung remains mildly hyperinflated and is clear. On the left inflation has improved. There is no discrete mass. Minimal atelectasis or postsurgical change at the left lung base is present. There is a surgical clip in the AP window region. The heart and pulmonary vascularity are normal. Minimal mediastinal shift toward the left is stable. The bony thorax exhibits no acute abnormality.  IMPRESSION: Further interval decrease in  postsurgical change at the left lung base. Stable mild volume loss but overall improved aeration of the left lung. No evidence of recurrent malignancy nor other active cardiopulmonary disease.   Electronically Signed   By: David  Martinique M.D.   On: 08/11/2018 10:15 I personally reviewed the chest x-ray images and concur with the findings noted above  Impression: [name] is a 76 year old woman with a history of tobacco abuse who was found to have bilateral lung nodules.  The nodule in the right head remained stable, but there was an increase in size in the left upper lobe nodule.  She underwent a thoracoscopic left upper lobectomy in September.  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  She did not require adjuvant therapy.  She is now almost 3 months out from surgery with no adverse effects.  She denies any pain.  She has not noticed a significant decrease in her respiratory capacity.  Tobacco cessation-importance of abstinence reemphasized.  Plan:  Follow-up as scheduled with Dr. Julien Nordmann.  I will be happy to see Mrs. Waggoner back anytime the future if I can be of any further assistance with her care.  Melrose Nakayama, MD [facility] [phone]

## 2018-08-17 NOTE — Telephone Encounter (Signed)
Multiple attempts have been made to contact the patient. Letter sent.

## 2018-08-20 ENCOUNTER — Telehealth: Payer: Self-pay | Admitting: Interventional Cardiology

## 2018-08-20 DIAGNOSIS — I480 Paroxysmal atrial fibrillation: Secondary | ICD-10-CM

## 2018-08-20 NOTE — Telephone Encounter (Signed)
Attempted to contact patient but there was no answer. Left message for patient to call back.

## 2018-08-20 NOTE — Telephone Encounter (Signed)
  Patient calling regarding letter she received stating we had tried to get in touch with her

## 2018-08-25 NOTE — Telephone Encounter (Signed)
Attempted to contact patient again but there was no answer. Left message for patient to call back.

## 2018-08-27 NOTE — Telephone Encounter (Signed)
Multiple attempts have been made to contact the patient to give recommendations (see 07/21/18 telephone encounter). Will send another letter for patient to contact the office.

## 2018-08-31 NOTE — Addendum Note (Signed)
Addended by: Sarina Ill on: 08/31/2018 01:08 PM   Modules accepted: Orders

## 2018-08-31 NOTE — Telephone Encounter (Signed)
Patient states she had somehow blocked out number and has now unblocked. You will be able to reach her now.

## 2018-08-31 NOTE — Telephone Encounter (Signed)
Message from Jettie Booze, MD sent at 07/20/2018  6:41 PM EST ----- Let's plan on stopping Eliquis 3 months after her surgery and then sending her to EP to discuss about ILR.  Thanks.  JV ----- Message ----- From: Thompson Grayer, MD Sent: 07/19/2018   3:33 PM EST To: Jettie Booze, MD  I think its really up to her. If she has only had sustained afib post operatively and only nonsustained atrial arrhythmias lasting < 30 seconds on her monitor, I think it would be ok to stop anticoagulation 3 months post op and resume if she has further sustained afib.  Another option is to implant a loop recorder for afib management and let that guide anticoagulation long term.    I would support either decision made by the patient... Im happy to see her if she and you would like for me to.  Thanks! JA  ----- Message ----- From: Jettie Booze, MD Sent: 07/08/2018   5:29 PM EST To: Thompson Grayer, MD  Glennie Hawk had post op AFib after thoracic surgery.  Quesiton of brief run of AFib vs. NSVT on monitor.  Other 99.9 % of time was NSR.  WOud she be an ILR candidate, OR just anticoagulate OR, just don't anticoagulate.  CHADS-vasc is 3.  Thanks.  Also DOAC are very expensive for her.  My inclination was ILR AND hold Eliquis based on results of ILR. THanks.

## 2018-08-31 NOTE — Telephone Encounter (Addendum)
Spoke Dr. Rayann Heman, he agreed that it would be fine to stop Eliquis and schedule with him to discuss ILR. Spoke with the patient, she expressed understanding and had no further questions. Message sent to scheduling for EP appointment.

## 2018-10-07 ENCOUNTER — Telehealth: Payer: Self-pay | Admitting: Internal Medicine

## 2018-10-07 ENCOUNTER — Ambulatory Visit (INDEPENDENT_AMBULATORY_CARE_PROVIDER_SITE_OTHER): Payer: Medicare Other | Admitting: Internal Medicine

## 2018-10-07 ENCOUNTER — Encounter: Payer: Self-pay | Admitting: Internal Medicine

## 2018-10-07 VITALS — BP 136/80 | HR 60 | Ht 62.0 in | Wt 138.6 lb

## 2018-10-07 DIAGNOSIS — I480 Paroxysmal atrial fibrillation: Secondary | ICD-10-CM

## 2018-10-07 NOTE — Telephone Encounter (Signed)
New Message:     Pt saw Dr Rayann Heman  earlier today, said she needs to talk to you please.

## 2018-10-07 NOTE — Progress Notes (Signed)
Electrophysiology Office Note   Date:  10/07/2018   ID:  LESLEY ATKIN, DOB 09-08-1941, MRN [id number]  PCP:  [name], MD  Cardiologist:  Dr Irish Lack Primary Electrophysiologist: Thompson Grayer, MD    CC: afib   History of Present Illness: [name] is a 78 y.o. female who presents today for electrophysiology evaluation.   She is referred by Dr Irish Lack for EP consultation regarding afib management.  She previously had afib post lung surgery.  She has since been without known episodes.  She wore a 30 day monitor which did not show any sustained arrhythmias.  Her chads2vasc score is 3.  Though she worries about risks of stroke, she also worries about costs of anticoagulation.   She has stopped eliquis and is taking asa.  As she was asymptomatic with prior afib, she presents for further discussions.  Today, she denies symptoms of palpitations, chest pain, shortness of breath, orthopnea, PND, lower extremity edema, claudication, dizziness, presyncope, syncope, bleeding, or neurologic sequela. The patient is tolerating medications without difficulties and is otherwise without complaint today.    Past Medical History:  Diagnosis Date  . Anxiety   . Blood glucose elevated 05/27/2012   pt. states that it has only been slightly high  . BP (high blood pressure) 12/03/2011  . Cancer ([place])    skin cancer  . 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 CN (constipation) 12/06/2015  . 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 GERD (gastroesophageal reflux disease)   . Hemorrhoid 12/06/2015  . History of hiatal hernia   . HLD (hyperlipidemia) 12/03/2011  . Hypertension   . Osteopenia 05/30/2015   Overview:  Bone density 05/2015 started fosamax   .  Primary localized osteoarthritis of left knee   . Primary localized osteoarthritis of right knee 12/06/2015   Past Surgical History:  Procedure Laterality Date  . ABDOMINAL HYSTERECTOMY    . CATARACT EXTRACTION, BILATERAL    . COLONOSCOPY    . EYE SURGERY Bilateral    Cataract  . KNEE SURGERY  05/01/2016   left uncompartmental   . PARTIAL KNEE ARTHROPLASTY Right 12/18/2015   Procedure: UNICOMPARTMENTAL RIGHT KNEE;  Surgeon: Elsie Saas, MD;  Location: [facility];  Service: Orthopedics;  Laterality: Right;  . PARTIAL KNEE ARTHROPLASTY Left 05/01/2016   Procedure: UNICOMPARTMENTAL KNEE;  Surgeon: Elsie Saas, MD;  Location: [facility];  Service: Orthopedics;  Laterality: Left;  . TOTAL ABDOMINAL HYSTERECTOMY W/ BILATERAL SALPINGOOPHORECTOMY    . VIDEO ASSISTED THORACOSCOPY (VATS)/ LOBECTOMY Left 05/13/2018   Procedure: VIDEO ASSISTED THORACOSCOPY (VATS)/LEFT UPPER LOBECTOMY;  Surgeon: Melrose Nakayama, MD;  Location[facility] OR;  Service: Thoracic;  Laterality: Left;     Current Outpatient Medications  Medication Sig Dispense Refill  . diphenhydramine-acetaminophen (TYLENOL PM) 25-500 MG TABS tablet Take 2 tablets by mouth at bedtime as needed (for sleep).    Marland Kitchen guaiFENesin (MUCINEX) 600 MG 12 hr tablet Take 2 tablets (1,200 mg total) by mouth 2 (two) times daily as needed for cough or to loosen phlegm.    . metoprolol succinate (TOPROL-XL) 50 MG 24 hr tablet Take 50 mg by mouth daily. Take with or immediately following a meal.    . Multiple Vitamins-Minerals (MULTIVITAMIN PO) Take 1  tablet by mouth daily.    . polyethylene glycol (MIRALAX / GLYCOLAX) packet Take 8.5 g by mouth every other day.    . simvastatin (ZOCOR) 20 MG tablet Take 20 mg by mouth daily.     No current facility-administered medications for this visit.     Allergies:   Ambien [zolpidem tartrate]; Sulfa antibiotics; Codeine; Hydrocodone; and Meperidine   Social History:  The patient  reports that she has been smoking  cigarettes. She has smoked for the past 50.00 years. She has never used smokeless tobacco. She reports current alcohol use. She reports that she does not use drugs.   Family History:  The patient's  family history includes Congestive Heart Failure in her mother.    ROS:  Please see the history of present illness.   All other systems are personally reviewed and negative.    PHYSICAL EXAM: VS:  BP 136/80   Pulse 60   Ht 5\' 2"  (1.575 m)   Wt 138 lb 9.6 oz (62.9 kg)   SpO2 98%   BMI 25.35 kg/m  , BMI Body mass index is 25.35 kg/m. GEN: Well nourished, well developed, in no acute distress  HEENT: normal  Neck: no JVD, carotid bruits, or masses Cardiac: RRR; no murmurs, rubs, or gallops,no edema  Respiratory:  clear to auscultation bilaterally, normal work of breathing GI: soft, nontender, nondistended, + BS MS: no deformity or atrophy  Skin: warm and dry  Neuro:  Strength and sensation are intact Psych: euthymic mood, full affect  EKG:  EKG is ordered today. The ekg ordered today is personally reviewed and shows sinus rhythm 60 bpm, PR 128 msec, nonspecific St/T changes   Recent Labs: 05/20/2018: Magnesium 2.0 05/21/2018: TSH 1.541 07/06/2018: ALT 9; BUN 12; Creatinine 0.96; Hemoglobin 14.1; Platelet Count 235; Potassium 4.3; Sodium 137  personally reviewed   Lipid Panel  No results found for: CHOL, TRIG, HDL, CHOLHDL, VLDL, LDLCALC, LDLDIRECT personally reviewed   Wt Readings from Last 3 Encounters:  10/07/18 138 lb 9.6 oz (62.9 kg)  08/11/18 137 lb 9.6 oz (62.4 kg)  07/08/18 139 lb 3.2 oz (63.1 kg)      Other studies personally reviewed: Additional studies/ records that were reviewed today include: recent event monitor, prior echo, Dr Hassell Done notes  Review of the above records today demonstrates: as above   ASSESSMENT AND PLAN:  1.  Paroxysmal atrial fibrillaton Previously only seen in the setting of post op state after lung surgery.  She has a chads2vasc score  of 3.  We discussed at length today.  Given asymptomatic afib previously, I share her concern of ongoing afib and stroke risks.  We also agree that we should avoid unnecessary anticoagulation if possible. I agree with Dr Irish Lack that long term monitoring with an implantable loop recorder would be beneficial to better characterize her afib and to guide anticoagulation decisions as per AHA/HRS guidelines.  I did discuss ILR implantation at length today.  She understands and wishes to proceed with her insurance will cover.  She will look into insurance coverage and contract my office when she is ready to proceed. We also discussed REACT AF study design, though the study is currently not available.  She would like to proceed.   Follow-up:  With Dr Irish Lack as scheduled She will contact my office when she is ready to proceed.  Hopefully, in office implantation would be allowable.  Current medicines are reviewed at length with the patient today.   The patient does  not have concerns regarding her medicines.  The following changes were made today:  none  Labs/ tests ordered today include:  Orders Placed This Encounter  Procedures  . EKG 12-Lead     Signed, Thompson Grayer, MD  10/07/2018 11:21 AM     [facility] [address] [place] [address] [phone] (office) [phone] (fax)

## 2018-10-07 NOTE — Patient Instructions (Addendum)
Medication Instructions:  Your physician recommends that you continue on your current medications as directed. Please refer to the Current Medication list given to you today.  Labwork: None ordered.  Testing/Procedures: An implantable loop recorder is a small electronic device that is placed under the skin of your chest. It is about the size of an AA ("double A") battery. The device records the electrical activity of your heart over a long period of time. Your health care provider can download these recordings to monitor your heart. You may need an implantable loop recorder if you have periods of abnormal heart activity (arrhythmias) or unexplained fainting (syncope). The recorder can be left in place for 1 year or longer.  Follow-Up:  Call the office and ask for Sonia Baller RN after you have spoken with your insurance. Sonia Baller RN [phone]  CPT codes: Implant [phone] Monthly billing codes:  [phone] and [phone]   Implantable Loop Recorder Placement  An implantable loop recorder is a small electronic device that is placed under the skin of your chest. It is about the size of an AA ("double A") battery. The device records the electrical activity of your heart over a long period of time. Your health care provider can download these recordings to monitor your heart. You may need an implantable loop recorder if you have periods of abnormal heart activity (arrhythmias) or unexplained fainting (syncope). The recorder can be left in place for 1 year or longer. Tell a health care provider about:  Any allergies you have.  All medicines you are taking, including vitamins, herbs, eye drops, creams, and over-the-counter medicines.  Any problems you or family members have had with anesthetic medicines.  Any blood disorders you have.  Any surgeries you have had.  Any medical conditions you have.  Whether you are pregnant or may be pregnant. What are the risks? Generally, this is a safe procedure.  However, problems may occur, including:  Infection.  Bleeding.  Allergic reactions to anesthetic medicines.  Damage to nerves or blood vessels.  Failure of the device to work. This could require another surgery to replace it. What happens before the procedure?   You may have a physical exam, blood tests, and imaging tests of your heart, such as a chest X-ray.  Follow instructions from your health care provider about eating or drinking restrictions.  Ask your health care provider about: ? Changing or stopping your regular medicines. This is especially important if you are taking diabetes medicines or blood thinners. ? Taking medicines such as aspirin and ibuprofen. These medicines can thin your blood. Do not take these medicines unless your health care provider tells you to take them. ? Taking over-the-counter medicines, vitamins, herbs, and supplements.  Ask your health care provider how your surgical site will be marked or identified.  Ask your health care provider what steps will be taken to help prevent infection. These may include: ? Removing hair at the surgery site. ? Washing skin with a germ-killing soap.  Plan to have someone take you home from the hospital or clinic.  Plan to have a responsible adult care for you for at least 24 hours after you leave the hospital or clinic. This is important.  Do not use any products that contain nicotine or tobacco, such as cigarettes and e-cigarettes. If you need help quitting, ask your health care provider. What happens during the procedure?  An IV will be inserted into one of your veins.  You may be given one or more of  the following: ? A medicine to help you relax (sedative). ? A medicine to numb the area (local anesthetic).  A small incision will be made on the left side of your upper chest.  A pocket will be created under your skin.  The device will be placed in the pocket.  The incision will be closed with stitches  (sutures) or adhesive strips.  A bandage (dressing) will be placed over the incision. The procedure may vary among health care providers and hospitals. What happens after the procedure?  Your blood pressure, heart rate, breathing rate, and blood oxygen level will be monitored until you leave the hospital or clinic.  You may be able to go home on the day of your surgery. Before you go home: ? Your health care provider will program your recorder. ? You will learn how to trigger your device with a handheld activator. ? You will learn how to send recordings to your health care provider. ? You will get an ID card for your device, and you will be told when to use it.  Do not drive for 24 hours if you were given a sedative during your procedure. Summary  An implantable loop recorder is a small electronic device that is placed under the skin of your chest to monitor your heart over a long period of time.  The recorder can be left in place for 1 year or longer.  Plan to have someone take you home from the hospital or clinic. This information is not intended to replace advice given to you by your health care provider. Make sure you discuss any questions you have with your health care provider. Document Released: 08/07/2015 Document Revised: 10/11/2017 Document Reviewed: 10/11/2017 Elsevier Interactive Patient Education  2019 [employer].

## 2018-10-07 NOTE — Progress Notes (Signed)
Cardiology Office Note   Date:  10/08/2018   ID:  [name], DOB August 16, 1941, MRN [id number]  PCP:  [name], MD    No chief complaint on file.  Paroxysmal atrial fibrillation  Wt Readings from Last 3 Encounters:  10/08/18 139 lb (63 kg)  10/07/18 138 lb 9.6 oz (62.9 kg)  08/11/18 137 lb 9.6 oz (62.4 kg)       History of Present Illness: [name] is a 78 y.o. female  who had thoracic surgery several weeks ago.  She had postoperative atrial fibrillation.  She spontaneously converted to normal sinus rhythm.  She then underwent monitoring.  There was a question of a short run of atrial fibrillation versus nonsustained ventricular tachycardia.  We then placed her on Eliquis at that point.  For the remainder of the monitor which lasted 30 days, she had no further atrial fibrillation.  She was asymptomatic with her atrial fibrillation in the hospital.  She was asymptomatic while wearing her monitor.  Eliquis was expensive.  SHe was to talk to Dr. Rayann Heman about ILR.  She is looking into insurance coverage.   Today, she reports that her insurance will cover it.    Denies : Chest pain. Dizziness. Leg edema. Nitroglycerin use. Orthopnea. Palpitations. Paroxysmal nocturnal dyspnea. Shortness of breath. Syncope.   She feels better off of Eliquis.    Past Medical History:  Diagnosis Date  . Anxiety   . Blood glucose elevated 05/27/2012   pt. states that it has only been slightly high  . BP (high blood pressure) 12/03/2011  . Cancer ([place])    skin cancer  . 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 CN (constipation) 12/06/2015  . 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 GERD (gastroesophageal reflux disease)   . Hemorrhoid 12/06/2015   . History of hiatal hernia   . HLD (hyperlipidemia) 12/03/2011  . Hypertension   . Osteopenia 05/30/2015   Overview:  Bone density 05/2015 started fosamax   . Primary localized osteoarthritis of left knee   . Primary localized osteoarthritis of right knee 12/06/2015    Past Surgical History:  Procedure Laterality Date  . ABDOMINAL HYSTERECTOMY    . CATARACT EXTRACTION, BILATERAL    . COLONOSCOPY    . EYE SURGERY Bilateral    Cataract  . KNEE SURGERY  05/01/2016   left uncompartmental   . PARTIAL KNEE ARTHROPLASTY Right 12/18/2015   Procedure: UNICOMPARTMENTAL RIGHT KNEE;  Surgeon: Elsie Saas, MD;  Location: [facility];  Service: Orthopedics;  Laterality: Right;  . PARTIAL KNEE ARTHROPLASTY Left 05/01/2016   Procedure: UNICOMPARTMENTAL KNEE;  Surgeon: Elsie Saas, MD;  Location: [facility];  Service: Orthopedics;  Laterality: Left;  . TOTAL ABDOMINAL HYSTERECTOMY W/ BILATERAL SALPINGOOPHORECTOMY    . VIDEO ASSISTED THORACOSCOPY (VATS)/ LOBECTOMY Left 05/13/2018   Procedure: VIDEO ASSISTED THORACOSCOPY (VATS)/LEFT UPPER LOBECTOMY;  Surgeon: Melrose Nakayama, MD;  Location: [facility] OR;  Service: Thoracic;  Laterality: Left;     Current Outpatient Medications  Medication Sig Dispense Refill  . diphenhydramine-acetaminophen (TYLENOL PM) 25-500 MG TABS tablet Take 2 tablets by mouth at bedtime as needed (for sleep).    Marland Kitchen guaiFENesin (MUCINEX) 600 MG 12 hr tablet Take 2 tablets (1,200 mg  total) by mouth 2 (two) times daily as needed for cough or to loosen phlegm.    . metoprolol succinate (TOPROL-XL) 50 MG 24 hr tablet Take 50 mg by mouth daily. Take with or immediately following a meal.    . Multiple Vitamins-Minerals (MULTIVITAMIN PO) Take 1 tablet by mouth daily.    . polyethylene glycol (MIRALAX / GLYCOLAX) packet Take 8.5 g by mouth every other day.    . simvastatin (ZOCOR) 20 MG tablet Take 20 mg by mouth daily.     No current facility-administered medications for this visit.      Allergies:   Ambien [zolpidem tartrate]; Sulfa antibiotics; Codeine; Hydrocodone; and Meperidine    Social History:  The patient  reports that she has been smoking cigarettes. She has smoked for the past 50.00 years. She has never used smokeless tobacco. She reports current alcohol use. She reports that she does not use drugs.   Family History:  The patient's family history includes Congestive Heart Failure in her mother.    ROS:  Please see the history of present illness.   Otherwise, review of systems are positive for increased energy since stoppinng Eliquis.   All other systems are reviewed and negative.    PHYSICAL EXAM: VS:  BP 140/80   Pulse 77   Ht 5\' 2"  (1.575 m)   Wt 139 lb (63 kg)   SpO2 95%   BMI 25.42 kg/m  , BMI Body mass index is 25.42 kg/m. GEN: Well nourished, well developed, in no acute distress  HEENT: normal  Neck: no JVD, carotid bruits, or masses Cardiac: RRR; no murmurs, rubs, or gallops,no edema  Respiratory:  clear to auscultation bilaterally, normal work of breathing GI: soft, nontender, nondistended, + BS MS: no deformity or atrophy  Skin: warm and dry, no rash Neuro:  Strength and sensation are intact Psych: euthymic mood, full affect   Recent Labs: 05/20/2018: Magnesium 2.0 05/21/2018: TSH 1.541 07/06/2018: ALT 9; BUN 12; Creatinine 0.96; Hemoglobin 14.1; Platelet Count 235; Potassium 4.3; Sodium 137   Lipid Panel No results found for: CHOL, TRIG, HDL, CHOLHDL, VLDL, LDLCALC, LDLDIRECT   Other studies Reviewed: Additional studies/ records that were reviewed today with results demonstrating: .   ASSESSMENT AND PLAN:  1. PAF: In NSR at this time. ILR to detect any more AFib.  2. Mixed hyperlipidemia:  Most recent lipids not available. On simvastatin.  3. Holding Eliquis at this time.  She was intolerant.  If she had more AFib, would have to consider a different anticoagulant.   Current medicines are reviewed at length with the patient  today.  The patient concerns regarding her medicines were addressed.  The following changes have been made:  No change  Labs/ tests ordered today include:  No orders of the defined types were placed in this encounter.   Recommend 150 minutes/week of aerobic exercise Low fat, low carb, high fiber diet recommended  Disposition:   FU in 1 year   Signed, Larae Grooms, MD  10/08/2018 11:30 AM    [facility] [place], [place], [place]  [address] Phone: [phone]; Fax: [phone]

## 2018-10-08 ENCOUNTER — Ambulatory Visit (INDEPENDENT_AMBULATORY_CARE_PROVIDER_SITE_OTHER): Payer: Medicare Other | Admitting: Interventional Cardiology

## 2018-10-08 ENCOUNTER — Encounter: Payer: Self-pay | Admitting: Interventional Cardiology

## 2018-10-08 VITALS — BP 140/80 | HR 77 | Ht 62.0 in | Wt 139.0 lb

## 2018-10-08 DIAGNOSIS — I480 Paroxysmal atrial fibrillation: Secondary | ICD-10-CM

## 2018-10-08 DIAGNOSIS — E782 Mixed hyperlipidemia: Secondary | ICD-10-CM | POA: Diagnosis not present

## 2018-10-08 NOTE — Patient Instructions (Signed)

## 2018-10-08 NOTE — Telephone Encounter (Signed)
Returned call to Pt.  Per Pt she has discussed with her insurance and device implant and monthly monitoring should be covered.  Pt would like to go ahead with loop placement in the office.  Will discuss with Dr. Rayann Heman.

## 2018-10-19 ENCOUNTER — Telehealth: Payer: Self-pay

## 2018-10-19 NOTE — Telephone Encounter (Signed)
Call back received from Pt.  Pt would like to schedule for loop placement October 26, 2018 at 8:20 am.  Will schedule.  Request sent to precert.

## 2018-10-19 NOTE — Telephone Encounter (Signed)
Call placed to Pt.  Left detailed message requesting call back in reference to loop placement.  Left this nurse name and # for call back.

## 2018-10-23 ENCOUNTER — Encounter: Payer: Self-pay | Admitting: Internal Medicine

## 2018-10-26 ENCOUNTER — Ambulatory Visit (INDEPENDENT_AMBULATORY_CARE_PROVIDER_SITE_OTHER): Payer: Medicare Other | Admitting: Internal Medicine

## 2018-10-26 ENCOUNTER — Encounter: Payer: Self-pay | Admitting: Internal Medicine

## 2018-10-26 VITALS — BP 132/80 | HR 64 | Ht 62.0 in | Wt 137.8 lb

## 2018-10-26 DIAGNOSIS — R002 Palpitations: Secondary | ICD-10-CM

## 2018-10-26 DIAGNOSIS — I480 Paroxysmal atrial fibrillation: Secondary | ICD-10-CM

## 2018-10-26 HISTORY — PX: OTHER SURGICAL HISTORY: SHX169

## 2018-10-26 NOTE — Progress Notes (Signed)
SURGEON:  Thompson Grayer, MD     PREPROCEDURE DIAGNOSIS:  Atrial fibrillation, palpitations    POSTPROCEDURE DIAGNOSIS:  Atrial fibrillation, palpitations     PROCEDURES:   1. Implantable loop recorder implantation    INTRODUCTION:  [name] is a 78 y.o. female with a history of palpitations and atrial fibrillation who presents today for implantable loop implantation.  The patient has palpitations of unclear significance.  She has had prior post operative afib.  She has concerns for ongoing afib.  she has worn telemetry previously during which she did not have arrhythmias.  There is significant concern for possible atrial fibrillation as the cause for the palpitations.  In addition long term afib management is felt to require additional monitoring.  The patient therefore presents today for implantable loop implantation.     DESCRIPTION OF PROCEDURE:  Informed written consent was obtained.  The patient required no sedation for the procedure today.  Mapping over the patient's chest was performed to identify the area where electrograms were most prominent for ILR recording.  This area was found to be the left inframammary region over the 3rd-4th intercostal space. The patients left chest was therefore prepped and draped in the usual sterile fashion. The skin overlying the left inframammary region was infiltrated with lidocaine for local analgesia.  A 0.5-cm incision was made over the left parasternal region over the 3rd intercostal space.  A subcutaneous ILR pocket was fashioned using a combination of sharp and blunt dissection.  A Medtronic Reveal Linq model [id number] implantable loop recorder was then placed into the pocket  R waves were very prominent and measured >0.41mV.  Steri- Strips and a sterile dressing were then applied.  There were no early apparent complications. Device SN [id number])     CONCLUSIONS:   1. Successful implantation of a Medtronic Reveal LINQ implantable loop recorder  for palpitations and recurrent symptoms of atrial fibrillation  2. No early apparent complications.   Thompson Grayer MD, [facility] 10/26/2018 9:14 AM

## 2018-10-26 NOTE — Patient Instructions (Addendum)
Medication Instructions:  Your physician recommends that you continue on your current medications as directed. Please refer to the Current Medication list given to you today.  Labwork: None ordered.  Testing/Procedures: None ordered.  Follow-Up:  You have a wound check scheduled for November 05, 2018 at 9:00 am at the [facility] office.  You will follow up with Dr. Rayann Heman on Jan 25, 2019 at 3:30 pm.     Implantable Loop Recorder Placement, Care After This sheet gives you information about how to care for yourself after your procedure. Your health care provider may also give you more specific instructions. If you have problems or questions, contact your health care provider. What can I expect after the procedure? After the procedure, it is common to have:  Soreness or discomfort near the incision.  Some swelling or bruising near the incision. Follow these instructions at home: Incision care   Follow instructions from your health care provider about how to take care of your incision. Make sure you: ? Wash your hands with soap and water before you change your bandage (dressing). If soap and water are not available, use hand sanitizer. ? Change your dressing as told by your health care provider. ? Keep your dressing dry. ? Leave stitches (sutures), skin glue, or adhesive strips in place. These skin closures may need to stay in place for 2 weeks or longer. If adhesive strip edges start to loosen and curl up, you may trim the loose edges. Do not remove adhesive strips completely unless your health care provider tells you to do that.  Check your incision area every day for signs of infection. Check for: ? Redness, swelling, or pain. ? Fluid or blood. ? Warmth. ? Pus or a bad smell.  Do not take baths, swim, or use a hot tub until your health care provider approves. Ask your health care provider if you can take showers. Activity   Return to your normal activities as told by your  health care provider. Ask your health care provider what activities are safe for you.  Do not drive for 24 hours if you were given a sedative during your procedure. General instructions  Follow instructions from your health care provider about how to manage your implantable loop recorder and transmit the information. Learn how to activate a recording if this is necessary for your type of device.  Do not go through a metal detection gate, and do not let someone hold a metal detector over your chest. Show your ID card.  Do not have an MRI unless you check with your health care provider first.  Take over-the-counter and prescription medicines only as told by your health care provider.  Keep all follow-up visits as told by your health care provider. This is important. Contact a health care provider if:  You have redness, swelling, or pain around your incision.  You have a fever.  You have pain that is not relieved by your pain medicine.  You have triggered your device because of fainting (syncope) or because of a heartbeat that feels like it is racing, slow, fluttering, or skipping (palpitations). Get help right away if you have:  Chest pain.  Difficulty breathing. Summary  After the procedure, it is common to have soreness or discomfort near the incision.  Change your dressing as told by your health care provider.  Follow instructions from your health care provider about how to manage your implantable loop recorder and transmit the information.  Keep all follow-up visits  as told by your health care provider. This is important. This information is not intended to replace advice given to you by your health care provider. Make sure you discuss any questions you have with your health care provider. Document Released: 08/07/2015 Document Revised: 10/11/2017 Document Reviewed: 10/11/2017 Elsevier Interactive Patient Education  2019 [employer].

## 2018-11-05 ENCOUNTER — Ambulatory Visit (INDEPENDENT_AMBULATORY_CARE_PROVIDER_SITE_OTHER): Payer: Medicare Other | Admitting: *Deleted

## 2018-11-05 DIAGNOSIS — I480 Paroxysmal atrial fibrillation: Secondary | ICD-10-CM

## 2018-11-05 LAB — CUP PACEART INCLINIC DEVICE CHECK
Implantable Pulse Generator Implant Date: 20200217
MDC IDC SESS DTM: 20200227092828

## 2018-11-05 NOTE — Progress Notes (Signed)
Wound check appointment. Steri-strips removed prior to appointment by patient. Wound without redness or edema. Incision edges approximated, wound well healed. Normal device function. Battery status: good. R-waves 0.41mV. No symptom, tachy, or AF episodes. Pause and brady detection off at implant. Patient educated about wound care and Carelink monitor. Monthly summary reports and ROV with JA on 01/25/19.

## 2018-11-29 DIAGNOSIS — N3000 Acute cystitis without hematuria: Secondary | ICD-10-CM | POA: Diagnosis not present

## 2018-11-30 ENCOUNTER — Ambulatory Visit (INDEPENDENT_AMBULATORY_CARE_PROVIDER_SITE_OTHER): Payer: Medicare Other | Admitting: *Deleted

## 2018-11-30 ENCOUNTER — Other Ambulatory Visit: Payer: Self-pay

## 2018-11-30 DIAGNOSIS — R002 Palpitations: Secondary | ICD-10-CM

## 2018-11-30 DIAGNOSIS — I480 Paroxysmal atrial fibrillation: Secondary | ICD-10-CM | POA: Diagnosis not present

## 2018-11-30 LAB — CUP PACEART REMOTE DEVICE CHECK
Date Time Interrogation Session: 20200321140736
Implantable Pulse Generator Implant Date: 20200217

## 2018-12-07 DIAGNOSIS — F17219 Nicotine dependence, cigarettes, with unspecified nicotine-induced disorders: Secondary | ICD-10-CM | POA: Diagnosis not present

## 2018-12-07 DIAGNOSIS — Z0000 Encounter for general adult medical examination without abnormal findings: Secondary | ICD-10-CM | POA: Diagnosis not present

## 2018-12-07 DIAGNOSIS — C3492 Malignant neoplasm of unspecified part of left bronchus or lung: Secondary | ICD-10-CM | POA: Diagnosis not present

## 2018-12-08 NOTE — Progress Notes (Signed)
Carelink Summary Report / Loop Recorder 

## 2018-12-31 ENCOUNTER — Ambulatory Visit (INDEPENDENT_AMBULATORY_CARE_PROVIDER_SITE_OTHER): Payer: Medicare Other | Admitting: *Deleted

## 2018-12-31 ENCOUNTER — Other Ambulatory Visit: Payer: Self-pay

## 2018-12-31 DIAGNOSIS — I480 Paroxysmal atrial fibrillation: Secondary | ICD-10-CM | POA: Diagnosis not present

## 2018-12-31 DIAGNOSIS — R002 Palpitations: Secondary | ICD-10-CM

## 2018-12-31 LAB — CUP PACEART REMOTE DEVICE CHECK
Date Time Interrogation Session: 20200423140441
Implantable Pulse Generator Implant Date: 20200217

## 2019-01-01 ENCOUNTER — Ambulatory Visit (HOSPITAL_COMMUNITY): Admission: RE | Admit: 2019-01-01 | Payer: Medicare Other | Source: Ambulatory Visit

## 2019-01-01 ENCOUNTER — Other Ambulatory Visit: Payer: Medicare Other

## 2019-01-05 ENCOUNTER — Ambulatory Visit: Payer: Medicare Other | Admitting: Internal Medicine

## 2019-01-08 NOTE — Progress Notes (Signed)
Carelink Summary Report / Loop Recorder 

## 2019-01-11 DIAGNOSIS — F17219 Nicotine dependence, cigarettes, with unspecified nicotine-induced disorders: Secondary | ICD-10-CM | POA: Diagnosis not present

## 2019-01-25 ENCOUNTER — Encounter: Payer: Self-pay | Admitting: Internal Medicine

## 2019-01-25 ENCOUNTER — Other Ambulatory Visit: Payer: Self-pay

## 2019-01-25 ENCOUNTER — Telehealth: Payer: Self-pay

## 2019-01-25 ENCOUNTER — Telehealth (INDEPENDENT_AMBULATORY_CARE_PROVIDER_SITE_OTHER): Payer: Medicare Other | Admitting: Internal Medicine

## 2019-01-25 VITALS — BP 123/78 | HR 63 | Ht 62.0 in | Wt 140.0 lb

## 2019-01-25 DIAGNOSIS — I480 Paroxysmal atrial fibrillation: Secondary | ICD-10-CM | POA: Diagnosis not present

## 2019-01-25 NOTE — Telephone Encounter (Signed)
Spoke with pt regarding appt on 01/25/19. Pt stated she will check vitals prior to appt. Pt concerns were address.

## 2019-01-25 NOTE — Telephone Encounter (Signed)
Left message regarding appt on 01/25/19.

## 2019-01-25 NOTE — Progress Notes (Signed)
Electrophysiology TeleHealth Note   Due to national recommendations of social distancing due to COVID 19, an audio/video telehealth visit is felt to be most appropriate for this patient at this time.  See MyChart message from today for the patient's consent to telehealth for [facility].   Date:  01/25/2019   ID:  [name], DOB [birth year], MRN [id number]  Location: patient's home  Provider location: [address] [place]  Evaluation Performed: Follow-up visit  PCP:  [name], MD  Cardiologist:  Larae Grooms, MD Electrophysiologist:  Dr Rayann Heman  Chief Complaint:  afib  History of Present Illness:    [name] is a 78 y.o. female who presents via audio/video conferencing for a telehealth visit today.  Since last being seen in our clinic, the patient reports doing very well.  Today, she denies symptoms of palpitations, chest pain, shortness of breath,  lower extremity edema, dizziness, presyncope, or syncope.  The patient is otherwise without complaint today.  The patient denies symptoms of fevers, chills, cough, or new SOB worrisome for COVID 19.  Past Medical History:  Diagnosis Date  . Anxiety   . Blood glucose elevated 05/27/2012   pt. states that it has only been slightly high  . BP (high blood pressure) 12/03/2011  . Cancer ([place])    skin cancer  . 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 CN (constipation) 12/06/2015  . 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 GERD (gastroesophageal reflux disease)   . Hemorrhoid 12/06/2015  . History of hiatal hernia   . HLD (hyperlipidemia) 12/03/2011  . Hypertension   . Osteopenia 05/30/2015   Overview:  Bone density 05/2015 started fosamax   . Primary localized  osteoarthritis of left knee   . Primary localized osteoarthritis of right knee 12/06/2015    Past Surgical History:  Procedure Laterality Date  . ABDOMINAL HYSTERECTOMY    . CATARACT EXTRACTION, BILATERAL    . COLONOSCOPY    . EYE SURGERY Bilateral    Cataract  . implantable loop recorder placement  10/26/2018   MDT LINQ Reveal XT ILR implanted for evaluation of palpitations and atrial fibrillation in office by Dr Rayann Heman, Device SN [id number])   . KNEE SURGERY  05/01/2016   left uncompartmental   . PARTIAL KNEE ARTHROPLASTY Right 12/18/2015   Procedure: UNICOMPARTMENTAL RIGHT KNEE;  Surgeon: Elsie Saas, MD;  Location: [place] [facility];  Service: Orthopedics;  Laterality: Right;  . PARTIAL KNEE ARTHROPLASTY Left 05/01/2016   Procedure: UNICOMPARTMENTAL KNEE;  Surgeon: Elsie Saas, MD;  Location: [facility];  Service: Orthopedics;  Laterality: Left;  . TOTAL ABDOMINAL HYSTERECTOMY W/ BILATERAL SALPINGOOPHORECTOMY    . VIDEO ASSISTED THORACOSCOPY (VATS)/ LOBECTOMY Left 05/13/2018   Procedure: VIDEO ASSISTED THORACOSCOPY (VATS)/LEFT UPPER LOBECTOMY;  Surgeon: Melrose Nakayama, MD;  [facility];  Service: Thoracic;  Laterality: Left;    Current Outpatient Medications  Medication Sig Dispense Refill  . cetirizine (ZYRTEC ALLERGY) 10 MG tablet Take 10 mg by mouth as needed for allergies.    . diphenhydramine-acetaminophen (TYLENOL PM) 25-500 MG TABS tablet Take 2 tablets by mouth at bedtime as needed (for sleep).    . metoprolol succinate (TOPROL-XL) 50 MG 24 hr tablet Take 50 mg  by mouth daily. Take with or immediately following a meal.    . Multiple Vitamins-Minerals (MULTIVITAMIN PO) Take 1 tablet by mouth daily.    . polyethylene glycol (MIRALAX / GLYCOLAX) packet Take 8.5 g by mouth every other day.    . simvastatin (ZOCOR) 20 MG tablet Take 20 mg by mouth daily.    . varenicline (CHANTIX) 1 MG tablet Take 1 mg by mouth 2 (two) times daily.     No current facility-administered medications  for this visit.     Allergies:   Ambien [zolpidem tartrate]; Sulfa antibiotics; Codeine; Hydrocodone; and Meperidine   Social History:  The patient  reports that she has been smoking cigarettes. She has smoked for the past 50.00 years. She has never used smokeless tobacco. She reports current alcohol use. She reports that she does not use drugs.   Family History:  The patient's family history includes Congestive Heart Failure in her mother.   ROS:  Please see the history of present illness.   All other systems are personally reviewed and negative.    Exam:    Vital Signs:  BP 123/78   Pulse 63   Ht 5\' 2"  (1.575 m)   Wt 140 lb (63.5 kg)   BMI 25.61 kg/m   Well appearing, alert and conversant, regular work of breathing,  good skin color Eyes- anicteric, neuro- grossly intact, skin- no apparent rash or lesions or cyanosis, mouth- oral mucosa is pink   Labs/Other Tests and Data Reviewed:    Recent Labs: 05/20/2018: Magnesium 2.0 05/21/2018: TSH 1.541 07/06/2018: ALT 9; BUN 12; Creatinine 0.96; Hemoglobin 14.1; Platelet Count 235; Potassium 4.3; Sodium 137   Wt Readings from Last 3 Encounters:  01/25/19 140 lb (63.5 kg)  10/26/18 137 lb 12.8 oz (62.5 kg)  10/08/18 139 lb (63 kg)     Other studies personally reviewed: Additional studies/ records that were reviewed today include: my prior notes  Review of the above records today demonstrates: as above   Last device remote is reviewed from [facility] PDF dated 12/2018 which reveals normal device function, no arrhythmias    ASSESSMENT & PLAN:    1.  Afib/ palpitations No afib observed on ILR We will start anticoagulation should she have AF in the future. She wishes to stop ASA at this time.  2. COVID 19 screen The patient denies symptoms of COVID 19 at this time.  The importance of social distancing was discussed today.  Follow-up:  12 months with Renee Next remote: carelink  Current medicines are reviewed at length with  the patient today.   The patient does not have concerns regarding her medicines.  The following changes were made today:  none  Labs/ tests ordered today include:  No orders of the defined types were placed in this encounter.   Patient Risk:  after full review of this patients clinical status, I feel that they are at moderate risk at this time.  Today, I have spent 15 minutes with the patient with telehealth technology discussing afib .    SignedThompson Grayer, MD  01/25/2019 3:31 PM     [place] [address] [phone] (office) [phone] (fax)

## 2019-01-26 ENCOUNTER — Inpatient Hospital Stay: Payer: Medicare Other | Attending: Internal Medicine

## 2019-01-26 ENCOUNTER — Other Ambulatory Visit: Payer: Self-pay

## 2019-01-26 ENCOUNTER — Telehealth: Payer: Self-pay | Admitting: Internal Medicine

## 2019-01-26 ENCOUNTER — Ambulatory Visit (HOSPITAL_COMMUNITY)
Admission: RE | Admit: 2019-01-26 | Discharge: 2019-01-26 | Disposition: A | Discharge status: Home / Self Care | Payer: Medicare Other | Source: Ambulatory Visit | Attending: Internal Medicine | Admitting: Internal Medicine

## 2019-01-26 DIAGNOSIS — Z902 Acquired absence of lung [part of]: Secondary | ICD-10-CM | POA: Insufficient documentation

## 2019-01-26 DIAGNOSIS — C3490 Malignant neoplasm of unspecified part of unspecified bronchus or lung: Secondary | ICD-10-CM

## 2019-01-26 DIAGNOSIS — J432 Centrilobular emphysema: Secondary | ICD-10-CM | POA: Diagnosis not present

## 2019-01-26 DIAGNOSIS — Z85118 Personal history of other malignant neoplasm of bronchus and lung: Secondary | ICD-10-CM | POA: Diagnosis not present

## 2019-01-26 LAB — CBC WITH DIFFERENTIAL (CANCER CENTER ONLY)
Abs Immature Granulocytes: 0.04 10*3/uL (ref 0.00–0.07)
Basophils Absolute: 0 10*3/uL (ref 0.0–0.1)
Basophils Relative: 0 %
Eosinophils Absolute: 0.1 10*3/uL (ref 0.0–0.5)
Eosinophils Relative: 1 %
HCT: 45.1 % (ref 36.0–46.0)
Hemoglobin: 14.9 g/dL (ref 12.0–15.0)
Immature Granulocytes: 0 %
Lymphocytes Relative: 16 %
Lymphs Abs: 1.7 10*3/uL (ref 0.7–4.0)
MCH: 32.2 pg (ref 26.0–34.0)
MCHC: 33 g/dL (ref 30.0–36.0)
MCV: 97.4 fL (ref 80.0–100.0)
Monocytes Absolute: 0.6 10*3/uL (ref 0.1–1.0)
Monocytes Relative: 6 %
Neutro Abs: 8.1 10*3/uL — ABNORMAL HIGH (ref 1.7–7.7)
Neutrophils Relative %: 77 %
Platelet Count: 235 10*3/uL (ref 150–400)
RBC: 4.63 MIL/uL (ref 3.87–5.11)
RDW: 12.1 % (ref 11.5–15.5)
WBC Count: 10.6 10*3/uL — ABNORMAL HIGH (ref 4.0–10.5)
nRBC: 0 % (ref 0.0–0.2)

## 2019-01-26 LAB — CMP (CANCER CENTER ONLY)
ALT: 11 U/L (ref 0–44)
AST: 16 U/L (ref 15–41)
Albumin: 4 g/dL (ref 3.5–5.0)
Alkaline Phosphatase: 63 U/L (ref 38–126)
Anion gap: 10 (ref 5–15)
BUN: 14 mg/dL (ref 8–23)
CO2: 25 mmol/L (ref 22–32)
Calcium: 9.4 mg/dL (ref 8.9–10.3)
Chloride: 103 mmol/L (ref 98–111)
Creatinine: 0.98 mg/dL (ref 0.44–1.00)
GFR, Est AFR Am: >60 mL/min (ref >60)
GFR, Estimated: 55 mL/min — ABNORMAL LOW (ref >60)
Glucose, Bld: 108 mg/dL — ABNORMAL HIGH (ref 70–99)
Potassium: 4.7 mmol/L (ref 3.5–5.1)
Sodium: 138 mmol/L (ref 135–145)
Total Bilirubin: 0.7 mg/dL (ref 0.3–1.2)
Total Protein: 6.9 g/dL (ref 6.5–8.1)

## 2019-01-26 MED ORDER — IOHEXOL 300 MG/ML  SOLN
75.0000 mL | Freq: Once | INTRAMUSCULAR | Status: AC | PRN
  Administered 2019-01-26: 10:00:00 75 mL via INTRAVENOUS

## 2019-01-26 MED ORDER — SODIUM CHLORIDE (PF) 0.9 % IJ SOLN
INTRAMUSCULAR | Status: AC
  Filled 2019-01-26: qty 50

## 2019-01-26 NOTE — Telephone Encounter (Signed)
Spoke with pt re 01/28/19 office visit and she agrees to phone call instead.  Pt aware to be near phone 5/10 mins prior to call.

## 2019-01-28 ENCOUNTER — Encounter: Payer: Self-pay | Admitting: Internal Medicine

## 2019-01-28 ENCOUNTER — Inpatient Hospital Stay (HOSPITAL_BASED_OUTPATIENT_CLINIC_OR_DEPARTMENT_OTHER): Payer: Medicare Other | Admitting: Internal Medicine

## 2019-01-28 DIAGNOSIS — C3490 Malignant neoplasm of unspecified part of unspecified bronchus or lung: Secondary | ICD-10-CM

## 2019-01-28 DIAGNOSIS — C3492 Malignant neoplasm of unspecified part of left bronchus or lung: Secondary | ICD-10-CM

## 2019-01-28 NOTE — Progress Notes (Signed)
[facility] Telephone:(336) [phone]   Fax:(336) [phone]  PROGRESS NOTE FOR TELEMEDICINE VISITS  [name], MD [place] [address]  I connected with@ on 01/28/19 at 11:45 AM EDT by telephone visit and verified that I am speaking with the correct person using two identifiers.   I discussed the limitations, risks, security and privacy concerns of performing an evaluation and management service by telemedicine and the availability of in-person appointments. I also discussed with the patient that there may be a patient responsible charge related to this service. The patient expressed understanding and agreed to proceed.  Other persons participating in the visit and their role in the encounter: None  Patient's location: Home Provider's location: [place]  DIAGNOSIS: Stage IA (T1c, N0, M0) non-small cell lung cancer, adenocarcinoma with lymphovascular invasion   PRIOR THERAPY: Status post left upper lobectomy with lymph node dissection in September 2019 under the care of Dr. Roxan Hockey  CURRENT THERAPY: Observation.  INTERVAL HISTORY: [name] 78 y.o. female has a telephone visual visit with me today for evaluation and recommendation regarding her condition and discussion of her scan results.  The patient is feeling fine today with no concerning complaints.  She denied having any chest pain, shortness of breath, cough or hemoptysis.  She denied having any fever or chills.  She has no nausea, vomiting, diarrhea or constipation.  She has no recent weight loss or night sweats.  She denied having any change in her medication or allergy list.  The patient had repeat CT scan of the chest performed recently and we are having the visit for discussion of her scan results.  MEDICAL HISTORY: Past Medical History:  Diagnosis Date  . Anxiety   . Blood glucose elevated 05/27/2012   pt. states that it has only been slightly high   . BP (high blood pressure) 12/03/2011  . Cancer ([place])    skin cancer  . 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 CN (constipation) 12/06/2015  . 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 GERD (gastroesophageal reflux disease)   . Hemorrhoid 12/06/2015  . History of hiatal hernia   . HLD (hyperlipidemia) 12/03/2011  . Hypertension   . Osteopenia 05/30/2015   Overview:  Bone density 05/2015 started fosamax   . Primary localized osteoarthritis of left knee   . Primary localized osteoarthritis of right knee 12/06/2015    ALLERGIES:  is allergic to [employer] tartrate]; sulfa antibiotics; codeine; hydrocodone; and meperidine.  MEDICATIONS:  Current Outpatient Medications  Medication Sig Dispense Refill  . cetirizine (ZYRTEC ALLERGY) 10 MG tablet Take 10 mg by mouth as needed for allergies.    . diphenhydramine-acetaminophen (TYLENOL PM) 25-500 MG TABS tablet Take 2 tablets by mouth at bedtime as needed (for sleep).    . metoprolol succinate (TOPROL-XL) 50 MG 24 hr tablet Take 50 mg by mouth daily. Take with or immediately following a meal.    . Multiple Vitamins-Minerals (MULTIVITAMIN PO) Take 1 tablet by mouth daily.    . polyethylene glycol (MIRALAX / GLYCOLAX) packet Take 8.5 g by mouth every other day.    . simvastatin (ZOCOR) 20 MG tablet Take 20 mg by mouth daily.    . varenicline (  CHANTIX) 1 MG tablet Take 1 mg by mouth 2 (two) times daily.     No current facility-administered medications for this visit.     SURGICAL HISTORY:  Past Surgical History:  Procedure Laterality Date  . ABDOMINAL HYSTERECTOMY    . CATARACT EXTRACTION, BILATERAL    . COLONOSCOPY    . EYE SURGERY Bilateral    Cataract  . implantable loop recorder placement  10/26/2018   MDT  LINQ Reveal XT ILR implanted for evaluation of palpitations and atrial fibrillation in office by Dr Rayann Heman, Device SN [id number])   . KNEE SURGERY  05/01/2016   left uncompartmental   . PARTIAL KNEE ARTHROPLASTY Right 12/18/2015   Procedure: UNICOMPARTMENTAL RIGHT KNEE;  Surgeon: Elsie Saas, MD;  Location: [facility];  Service: Orthopedics;  Laterality: Right;  . PARTIAL KNEE ARTHROPLASTY Left 05/01/2016   Procedure: UNICOMPARTMENTAL KNEE;  Surgeon: Elsie Saas, MD;  Location: [facility];  Service: Orthopedics;  Laterality: Left;  . TOTAL ABDOMINAL HYSTERECTOMY W/ BILATERAL SALPINGOOPHORECTOMY    . VIDEO ASSISTED THORACOSCOPY (VATS)/ LOBECTOMY Left 05/13/2018   Procedure: VIDEO ASSISTED THORACOSCOPY (VATS)/LEFT UPPER LOBECTOMY;  Surgeon: Melrose Nakayama, MD;  Location: [facility] OR;  Service: Thoracic;  Laterality: Left;    REVIEW OF SYSTEMS:  A comprehensive review of systems was negative.   LABORATORY DATA: Lab Results  Component Value Date   WBC 10.6 (H) 01/26/2019   HGB 14.9 01/26/2019   HCT 45.1 01/26/2019   MCV 97.4 01/26/2019   PLT 235 01/26/2019      Chemistry      Component Value Date/Time   NA 138 01/26/2019 0847   K 4.7 01/26/2019 0847   CL 103 01/26/2019 0847   CO2 25 01/26/2019 0847   BUN 14 01/26/2019 0847   CREATININE 0.98 01/26/2019 0847      Component Value Date/Time   CALCIUM 9.4 01/26/2019 0847   ALKPHOS 63 01/26/2019 0847   AST 16 01/26/2019 0847   ALT 11 01/26/2019 0847   BILITOT 0.7 01/26/2019 0847       RADIOGRAPHIC STUDIES: Ct Chest W Contrast  Result Date: 01/26/2019 CLINICAL DATA:  Stage IA lung adenocarcinoma of the left upper lobe status post left upper lobectomy 05/13/2018. Restaging. EXAM: CT CHEST WITH CONTRAST TECHNIQUE: Multidetector CT imaging of the chest was performed during intravenous contrast administration. CONTRAST:  2mL OMNIPAQUE IOHEXOL 300 MG/ML  SOLN COMPARISON:  04/24/2018 PET-CT. 04/06/2018 chest CT. 08/11/2018 chest radiograph.  FINDINGS: Cardiovascular: Normal heart size. No significant pericardial effusion/thickening. Left anterior descending coronary atherosclerosis. Atherosclerotic nonaneurysmal thoracic aorta. Normal caliber pulmonary arteries. No central pulmonary emboli. Mediastinum/Nodes: Stable 1.4 cm slightly hypodense right thyroid lobe nodule. Stable coarsely calcified subcentimeter left thyroid lobe nodule. Unremarkable esophagus. No pathologically enlarged axillary, mediastinal or hilar lymph nodes. Lungs/Pleura: No pneumothorax. Trace medial dependent left pleural effusion. No right pleural effusion. Interval left upper lobectomy. Moderate centrilobular and paraseptal emphysema. No acute consolidative airspace disease or lung masses. Apical right upper lobe 1.0 cm subsolid pulmonary nodule (series 5/image 20), previously 1.0 cm on 04/06/2018 chest CT, stable. Anterior left lower lobe 0.5 cm subsolid pulmonary nodule (series 5/image 44), felt to correlate with a 0.5 cm subsolid nodule in the anterior left lower lobe on 04/06/2018 chest CT, stable. No new significant pulmonary nodules. Upper abdomen: Small hiatal hernia. A few scattered subcentimeter hypodense liver lesions are too small to characterize and are unchanged presumably benign. Musculoskeletal: No aggressive appearing focal osseous lesions. Mild thoracic spondylosis. Interval placement of subcutaneous loop recorder in  medial ventral left chest wall. IMPRESSION: 1. Interval left upper lobectomy. No evidence of local tumor recurrence. 2. Bilateral subsolid pulmonary nodules are stable compared to the preoperative 04/06/2018 chest CT. Attention on routine chest CT surveillance advised. 3. No new or progressive findings to suggest metastatic disease in the chest. Aortic Atherosclerosis (ICD10-I70.0) and Emphysema (ICD10-J43.9). Electronically Signed   By: Ilona Sorrel M.D.   On: 01/26/2019 17:12    ASSESSMENT AND PLAN: This is a very pleasant 78 years old white  female with stage IA (T1c, N0, M0) non-small cell lung cancer, adenocarcinoma with lymphovascular invasion status post left upper lobectomy with lymph node dissection in September 2019 under the care of Dr. Roxan Hockey The patient is currently on observation and she is feeling fine. She had repeat CT scan of the chest performed recently.  I personally and independently reviewed the scans and discussed the results with the patient.  Her scan showed no concerning findings for disease recurrence or progression. I recommended for the patient to continue on observation with repeat CT scan of the chest in 6 months. She was advised to call immediately if she has any concerning symptoms in the interval. I discussed the assessment and treatment plan with the patient. The patient was provided an opportunity to ask questions and all were answered. The patient agreed with the plan and demonstrated an understanding of the instructions.   The patient was advised to call back or seek an in-person evaluation if the symptoms worsen or if the condition fails to improve as anticipated.  I provided 11 minutes of non face-to-face telephone visit time during this encounter, and > 50% was spent counseling as documented under my assessment & plan.  Eilleen Kempf, MD 01/28/2019 11:53 AM  Disclaimer: This note was dictated with voice recognition software. Similar sounding words can inadvertently be transcribed and may not be corrected upon review.

## 2019-01-29 ENCOUNTER — Telehealth: Payer: Self-pay | Admitting: Internal Medicine

## 2019-01-29 NOTE — Telephone Encounter (Signed)
Scheduled appt per 5/21 los - mailed letter with appt date and time

## 2019-02-02 ENCOUNTER — Ambulatory Visit (INDEPENDENT_AMBULATORY_CARE_PROVIDER_SITE_OTHER): Payer: Medicare Other | Admitting: *Deleted

## 2019-02-02 DIAGNOSIS — R002 Palpitations: Secondary | ICD-10-CM

## 2019-02-02 DIAGNOSIS — I480 Paroxysmal atrial fibrillation: Secondary | ICD-10-CM | POA: Diagnosis not present

## 2019-02-03 LAB — CUP PACEART REMOTE DEVICE CHECK
Date Time Interrogation Session: 20200526143613
Implantable Pulse Generator Implant Date: 20200217

## 2019-02-10 DIAGNOSIS — R739 Hyperglycemia, unspecified: Secondary | ICD-10-CM | POA: Diagnosis not present

## 2019-02-10 DIAGNOSIS — C3492 Malignant neoplasm of unspecified part of left bronchus or lung: Secondary | ICD-10-CM | POA: Diagnosis not present

## 2019-02-10 DIAGNOSIS — M25472 Effusion, left ankle: Secondary | ICD-10-CM | POA: Diagnosis not present

## 2019-02-10 DIAGNOSIS — J438 Other emphysema: Secondary | ICD-10-CM | POA: Diagnosis not present

## 2019-02-10 DIAGNOSIS — F17218 Nicotine dependence, cigarettes, with other nicotine-induced disorders: Secondary | ICD-10-CM | POA: Diagnosis not present

## 2019-02-10 DIAGNOSIS — I499 Cardiac arrhythmia, unspecified: Secondary | ICD-10-CM | POA: Diagnosis not present

## 2019-02-10 DIAGNOSIS — I10 Essential (primary) hypertension: Secondary | ICD-10-CM | POA: Diagnosis not present

## 2019-02-10 DIAGNOSIS — M8580 Other specified disorders of bone density and structure, unspecified site: Secondary | ICD-10-CM | POA: Diagnosis not present

## 2019-02-10 DIAGNOSIS — E782 Mixed hyperlipidemia: Secondary | ICD-10-CM | POA: Diagnosis not present

## 2019-02-10 NOTE — Progress Notes (Signed)
Carelink Summary Report / Loop Recorder 

## 2019-03-08 ENCOUNTER — Ambulatory Visit (INDEPENDENT_AMBULATORY_CARE_PROVIDER_SITE_OTHER): Payer: Medicare Other | Admitting: *Deleted

## 2019-03-08 DIAGNOSIS — I480 Paroxysmal atrial fibrillation: Secondary | ICD-10-CM

## 2019-03-08 DIAGNOSIS — R002 Palpitations: Secondary | ICD-10-CM

## 2019-03-08 LAB — CUP PACEART REMOTE DEVICE CHECK
Date Time Interrogation Session: 20200628153650
Implantable Pulse Generator Implant Date: 20200217

## 2019-03-16 NOTE — Progress Notes (Signed)
Carelink Summary Report / Loop Recorder 

## 2019-04-09 ENCOUNTER — Ambulatory Visit (INDEPENDENT_AMBULATORY_CARE_PROVIDER_SITE_OTHER): Payer: Medicare Other | Admitting: *Deleted

## 2019-04-09 DIAGNOSIS — I480 Paroxysmal atrial fibrillation: Secondary | ICD-10-CM

## 2019-04-09 DIAGNOSIS — R002 Palpitations: Secondary | ICD-10-CM

## 2019-04-09 LAB — CUP PACEART REMOTE DEVICE CHECK
Date Time Interrogation Session: 20200731144141
Implantable Pulse Generator Implant Date: 20200217

## 2019-04-15 NOTE — Progress Notes (Signed)
Carelink Summary Report / Loop Recorder 

## 2019-05-12 ENCOUNTER — Ambulatory Visit (INDEPENDENT_AMBULATORY_CARE_PROVIDER_SITE_OTHER): Payer: Medicare Other | Admitting: *Deleted

## 2019-05-12 DIAGNOSIS — I4891 Unspecified atrial fibrillation: Secondary | ICD-10-CM

## 2019-05-12 DIAGNOSIS — R002 Palpitations: Secondary | ICD-10-CM

## 2019-05-13 LAB — CUP PACEART REMOTE DEVICE CHECK
Date Time Interrogation Session: 20200902164211
Implantable Pulse Generator Implant Date: 20200217

## 2019-05-28 NOTE — Progress Notes (Signed)
Carelink Summary Report / Loop Recorder 

## 2019-06-14 ENCOUNTER — Ambulatory Visit (INDEPENDENT_AMBULATORY_CARE_PROVIDER_SITE_OTHER): Payer: Medicare Other | Admitting: *Deleted

## 2019-06-14 DIAGNOSIS — R002 Palpitations: Secondary | ICD-10-CM | POA: Diagnosis not present

## 2019-06-14 DIAGNOSIS — I4891 Unspecified atrial fibrillation: Secondary | ICD-10-CM

## 2019-06-15 DIAGNOSIS — Z23 Encounter for immunization: Secondary | ICD-10-CM | POA: Diagnosis not present

## 2019-06-15 LAB — CUP PACEART REMOTE DEVICE CHECK
Date Time Interrogation Session: 20201005164021
Implantable Pulse Generator Implant Date: 20200217

## 2019-06-21 NOTE — Progress Notes (Signed)
Carelink Summary Report / Loop Recorder 

## 2019-07-06 DIAGNOSIS — I10 Essential (primary) hypertension: Secondary | ICD-10-CM | POA: Diagnosis not present

## 2019-07-06 DIAGNOSIS — M7989 Other specified soft tissue disorders: Secondary | ICD-10-CM | POA: Diagnosis not present

## 2019-07-06 DIAGNOSIS — M79604 Pain in right leg: Secondary | ICD-10-CM | POA: Diagnosis not present

## 2019-07-09 DIAGNOSIS — Z96651 Presence of right artificial knee joint: Secondary | ICD-10-CM | POA: Diagnosis not present

## 2019-07-09 DIAGNOSIS — M79604 Pain in right leg: Secondary | ICD-10-CM | POA: Diagnosis not present

## 2019-07-09 DIAGNOSIS — M7989 Other specified soft tissue disorders: Secondary | ICD-10-CM | POA: Diagnosis not present

## 2019-07-09 DIAGNOSIS — M1711 Unilateral primary osteoarthritis, right knee: Secondary | ICD-10-CM | POA: Diagnosis not present

## 2019-07-19 ENCOUNTER — Ambulatory Visit (INDEPENDENT_AMBULATORY_CARE_PROVIDER_SITE_OTHER): Payer: Medicare Other | Admitting: *Deleted

## 2019-07-19 DIAGNOSIS — R002 Palpitations: Secondary | ICD-10-CM

## 2019-07-20 LAB — CUP PACEART REMOTE DEVICE CHECK
Date Time Interrogation Session: 20201107164409
Implantable Pulse Generator Implant Date: 20200217

## 2019-07-22 DIAGNOSIS — Z96653 Presence of artificial knee joint, bilateral: Secondary | ICD-10-CM | POA: Diagnosis not present

## 2019-07-30 ENCOUNTER — Other Ambulatory Visit: Payer: Self-pay

## 2019-07-30 ENCOUNTER — Inpatient Hospital Stay: Payer: Medicare Other | Attending: Internal Medicine

## 2019-07-30 ENCOUNTER — Ambulatory Visit (HOSPITAL_COMMUNITY)
Admission: RE | Admit: 2019-07-30 | Discharge: 2019-07-30 | Disposition: A | Discharge status: Home / Self Care | Payer: Medicare Other | Source: Ambulatory Visit | Attending: Internal Medicine | Admitting: Internal Medicine

## 2019-07-30 DIAGNOSIS — J439 Emphysema, unspecified: Secondary | ICD-10-CM | POA: Diagnosis not present

## 2019-07-30 DIAGNOSIS — C3490 Malignant neoplasm of unspecified part of unspecified bronchus or lung: Secondary | ICD-10-CM

## 2019-07-30 DIAGNOSIS — Z85118 Personal history of other malignant neoplasm of bronchus and lung: Secondary | ICD-10-CM | POA: Insufficient documentation

## 2019-07-30 DIAGNOSIS — Z902 Acquired absence of lung [part of]: Secondary | ICD-10-CM | POA: Insufficient documentation

## 2019-07-30 LAB — CBC WITH DIFFERENTIAL (CANCER CENTER ONLY)
Abs Immature Granulocytes: 0.03 10*3/uL (ref 0.00–0.07)
Basophils Absolute: 0 10*3/uL (ref 0.0–0.1)
Basophils Relative: 0 %
Eosinophils Absolute: 0.2 10*3/uL (ref 0.0–0.5)
Eosinophils Relative: 2 %
HCT: 42 % (ref 36.0–46.0)
Hemoglobin: 14.1 g/dL (ref 12.0–15.0)
Immature Granulocytes: 0 %
Lymphocytes Relative: 20 %
Lymphs Abs: 1.6 10*3/uL (ref 0.7–4.0)
MCH: 33.1 pg (ref 26.0–34.0)
MCHC: 33.6 g/dL (ref 30.0–36.0)
MCV: 98.6 fL (ref 80.0–100.0)
Monocytes Absolute: 0.5 10*3/uL (ref 0.1–1.0)
Monocytes Relative: 6 %
Neutro Abs: 6 10*3/uL (ref 1.7–7.7)
Neutrophils Relative %: 72 %
Platelet Count: 234 10*3/uL (ref 150–400)
RBC: 4.26 MIL/uL (ref 3.87–5.11)
RDW: 12.3 % (ref 11.5–15.5)
WBC Count: 8.3 10*3/uL (ref 4.0–10.5)
nRBC: 0 % (ref 0.0–0.2)

## 2019-07-30 LAB — CMP (CANCER CENTER ONLY)
ALT: 9 U/L (ref 0–44)
AST: 17 U/L (ref 15–41)
Albumin: 4.1 g/dL (ref 3.5–5.0)
Alkaline Phosphatase: 72 U/L (ref 38–126)
Anion gap: 8 (ref 5–15)
BUN: 20 mg/dL (ref 8–23)
CO2: 25 mmol/L (ref 22–32)
Calcium: 9.4 mg/dL (ref 8.9–10.3)
Chloride: 105 mmol/L (ref 98–111)
Creatinine: 0.9 mg/dL (ref 0.44–1.00)
GFR, Est AFR Am: >60 mL/min (ref >60)
GFR, Estimated: >60 mL/min (ref >60)
Glucose, Bld: 105 mg/dL — ABNORMAL HIGH (ref 70–99)
Potassium: 4.6 mmol/L (ref 3.5–5.1)
Sodium: 138 mmol/L (ref 135–145)
Total Bilirubin: 0.6 mg/dL (ref 0.3–1.2)
Total Protein: 6.7 g/dL (ref 6.5–8.1)

## 2019-07-30 MED ORDER — IOHEXOL 300 MG/ML  SOLN
75.0000 mL | Freq: Once | INTRAMUSCULAR | Status: AC | PRN
  Administered 2019-07-30: 75 mL via INTRAVENOUS

## 2019-07-30 MED ORDER — SODIUM CHLORIDE (PF) 0.9 % IJ SOLN
INTRAMUSCULAR | Status: AC
  Filled 2019-07-30: qty 50

## 2019-08-02 ENCOUNTER — Telehealth: Payer: Self-pay | Admitting: Internal Medicine

## 2019-08-02 NOTE — Telephone Encounter (Signed)
Called and spoke with patient. Confirmed change of appt from in person to mychart visit.

## 2019-08-03 ENCOUNTER — Telehealth (HOSPITAL_BASED_OUTPATIENT_CLINIC_OR_DEPARTMENT_OTHER): Payer: Medicare Other | Admitting: Internal Medicine

## 2019-08-03 ENCOUNTER — Encounter: Payer: Self-pay | Admitting: Internal Medicine

## 2019-08-03 DIAGNOSIS — C3492 Malignant neoplasm of unspecified part of left bronchus or lung: Secondary | ICD-10-CM

## 2019-08-03 DIAGNOSIS — Z85118 Personal history of other malignant neoplasm of bronchus and lung: Secondary | ICD-10-CM | POA: Diagnosis not present

## 2019-08-03 DIAGNOSIS — C3490 Malignant neoplasm of unspecified part of unspecified bronchus or lung: Secondary | ICD-10-CM

## 2019-08-03 DIAGNOSIS — Z902 Acquired absence of lung [part of]: Secondary | ICD-10-CM | POA: Diagnosis not present

## 2019-08-03 NOTE — Progress Notes (Signed)
[facility] Telephone:(336) [phone]   Fax:(336) [phone]  PROGRESS NOTE FOR TELEMEDICINE VISITS  [name], MD [place] [address]  I connected with@ on 08/03/19 at  3:15 PM EST by video enabled telemedicine visit and verified that I am speaking with the correct person using two identifiers.   I discussed the limitations, risks, security and privacy concerns of performing an evaluation and management service by telemedicine and the availability of in-person appointments. I also discussed with the patient that there may be a patient responsible charge related to this service. The patient expressed understanding and agreed to proceed.  Other persons participating in the visit and their role in the encounter:  Home.  Patient's location:  Home Provider's location: [place].  DIAGNOSIS: Stage IA(T1c, N0, M0) non-small cell lung cancer, adenocarcinoma with lymphovascular invasion   PRIOR THERAPY: Status post left upper lobectomy with lymph node dissection in September 2019 under the care of Dr. Roxan Hockey  CURRENT THERAPY: Observation.  INTERVAL HISTORY: [name] 78 y.o. female has a MyChart video virtual visit with me today for evaluation and discussion of her scan results.  The patient is feeling fine today with no concerning complaints.  She denied having any current chest pain, shortness breath, cough or hemoptysis.  She denied having any fever or chills.  She has no nausea, vomiting, diarrhea or constipation.  She has no headache or visual changes.  She had repeat CT scan of the chest performed recently and she is here for evaluation and discussion of her scan results.  MEDICAL HISTORY: Past Medical History:  Diagnosis Date  . Anxiety   . Blood glucose elevated 05/27/2012   pt. states that it has only been slightly high  . BP (high blood pressure) 12/03/2011  . Cancer ([place])    skin cancer  . Cardiac  conduction disorder 12/06/2015   Overview:  Stress test normal  2006 Angiogram normal  Last Assessment & Plan:  Relevant Hx: Course: Daily Update: Today's Plan:   . CN (constipation) 12/06/2015  . 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 GERD (gastroesophageal reflux disease)   . Hemorrhoid 12/06/2015  . History of hiatal hernia   . HLD (hyperlipidemia) 12/03/2011  . Hypertension   . Osteopenia 05/30/2015   Overview:  Bone density 05/2015 started fosamax   . Primary localized osteoarthritis of left knee   . Primary localized osteoarthritis of right knee 12/06/2015    ALLERGIES:  is allergic to [employer] tartrate]; sulfa antibiotics; codeine; hydrocodone; and meperidine.  MEDICATIONS:  Current Outpatient Medications  Medication Sig Dispense Refill  . cetirizine (ZYRTEC ALLERGY) 10 MG tablet Take 10 mg by mouth as needed for allergies.    . diphenhydramine-acetaminophen (TYLENOL PM) 25-500 MG TABS tablet Take 2 tablets by mouth at bedtime as needed (for sleep).    . metoprolol succinate (TOPROL-XL) 50 MG 24 hr tablet Take 50 mg by mouth daily. Take with or immediately following a meal.    . Multiple Vitamins-Minerals (MULTIVITAMIN PO) Take 1 tablet by mouth daily.    . polyethylene glycol (MIRALAX / GLYCOLAX) packet Take 8.5 g by mouth every other day.    . simvastatin (ZOCOR) 20 MG tablet Take 20 mg by mouth daily.    . varenicline (CHANTIX) 1 MG tablet Take 1 mg by mouth 2 (two) times daily.  No current facility-administered medications for this visit.     SURGICAL HISTORY:  Past Surgical History:  Procedure Laterality Date  . ABDOMINAL HYSTERECTOMY    . CATARACT EXTRACTION, BILATERAL    . COLONOSCOPY    . EYE SURGERY Bilateral    Cataract  . implantable loop recorder placement  10/26/2018   MDT LINQ Reveal XT ILR implanted for evaluation of palpitations and atrial fibrillation  in office by Dr Rayann Heman, Device SN [id number])   . KNEE SURGERY  05/01/2016   left uncompartmental   . PARTIAL KNEE ARTHROPLASTY Right 12/18/2015   Procedure: UNICOMPARTMENTAL RIGHT KNEE;  Surgeon: Elsie Saas, MD;  Location: [facility];  Service: Orthopedics;  Laterality: Right;  . PARTIAL KNEE ARTHROPLASTY Left 05/01/2016   Procedure: UNICOMPARTMENTAL KNEE;  Surgeon: Elsie Saas, MD;  Location: [place];  Service: Orthopedics;  Laterality: Left;  . TOTAL ABDOMINAL HYSTERECTOMY W/ BILATERAL SALPINGOOPHORECTOMY    . VIDEO ASSISTED THORACOSCOPY (VATS)/ LOBECTOMY Left 05/13/2018   Procedure: VIDEO ASSISTED THORACOSCOPY (VATS)/LEFT UPPER LOBECTOMY;  Surgeon: Melrose Nakayama, MD;  Location: [facility] OR;  Service: Thoracic;  Laterality: Left;    REVIEW OF SYSTEMS:  A comprehensive review of systems was negative.    LABORATORY DATA: Lab Results  Component Value Date   WBC 8.3 07/30/2019   HGB 14.1 07/30/2019   HCT 42.0 07/30/2019   MCV 98.6 07/30/2019   PLT 234 07/30/2019      Chemistry      Component Value Date/Time   NA 138 07/30/2019 0813   K 4.6 07/30/2019 0813   CL 105 07/30/2019 0813   CO2 25 07/30/2019 0813   BUN 20 07/30/2019 0813   CREATININE 0.90 07/30/2019 0813      Component Value Date/Time   CALCIUM 9.4 07/30/2019 0813   ALKPHOS 72 07/30/2019 0813   AST 17 07/30/2019 0813   ALT 9 07/30/2019 0813   BILITOT 0.6 07/30/2019 0813       RADIOGRAPHIC STUDIES: Ct Chest W Contrast  Result Date: 07/30/2019 CLINICAL DATA:  Restaging non-small cell lung cancer. History of left upper lobe lobectomy 05/13/2018. EXAM: CT CHEST WITH CONTRAST TECHNIQUE: Multidetector CT imaging of the chest was performed during intravenous contrast administration. CONTRAST:  87mL OMNIPAQUE IOHEXOL 300 MG/ML  SOLN COMPARISON:  Chest CT 01/26/2019 and PET-CT 04/24/2018 FINDINGS: Cardiovascular: The heart is normal in size. No pericardial effusion. The aorta is normal in caliber. Stable mild tortuosity  and scattered atherosclerotic calcifications. No dissection. The branch vessels are patent. Stable scattered coronary artery calcifications. Mediastinum/Nodes: No mediastinal or hilar mass or adenopathy. Small scattered lymph nodes are stable. No new or progressive findings. The esophagus is grossly normal. The thyroid gland is unremarkable. Lungs/Pleura: Stable advanced emphysematous changes and pulmonary scarring. Stable surgical changes from a left upper lobe lobectomy. Stable right upper lobe nodular opacity measuring 7.5 mm on image 24/5. Stable 3 mm right upper lobe nodule on image 40/5. Stable 4 mm left lung nodule on image 49/5. Stable adjacent 5 mm nodules on image 88 and 92/5 in the right middle lobe medially. No new pulmonary lesions or acute overlying pulmonary process. No pleural effusions. Upper Abdomen: No significant upper abdominal findings. No worrisome hepatic or adrenal gland lesions. A few small scattered low-attenuation liver lesions are likely benign cysts and are stable. Stable right renal cysts and left parapelvic renal cysts. Musculoskeletal: No acute bony findings.  No worrisome bone lesions. No breast masses, supraclavicular or axillary adenopathy. IMPRESSION: 1. Stable surgical changes from a left upper  lobe lobectomy. No findings suspicious for recurrent tumor. 2. No mediastinal or hilar mass or adenopathy. Small scattered lymph nodes are stable. 3. Stable advanced emphysematous changes and pulmonary scarring. 4. Stable small bilateral pulmonary nodules. Recommend continued routine surveillance. No new or progressive findings. 5. No findings suspicious for upper abdominal metastatic disease. Aortic Atherosclerosis (ICD10-I70.0) and Emphysema (ICD10-J43.9). Electronically Signed   By: Marijo Sanes M.D.   On: 07/30/2019 10:42    ASSESSMENT AND PLAN:  This is a very pleasant 78 years old white female with stage IA(T1c, N0, M0) non-small cell lung cancer, adenocarcinoma with  lymphovascular invasion status post left upper lobectomy with lymph node dissection in September 2019 under the care of Dr. Roxan Hockey The patient has been on observation since that time and she is feeling fine. She had repeat CT scan of the chest performed recently.  I personally and independently reviewed the scans and discussed the results with the patient today. Her scan showed no concerning findings for disease recurrence or metastasis. I recommended for the patient to continue on observation with repeat CT scan of the chest in 6 months. The patient was advised to call immediately if she has any concerning symptoms in the interval. I discussed the assessment and treatment plan with the patient. The patient was provided an opportunity to ask questions and all were answered. The patient agreed with the plan and demonstrated an understanding of the instructions.   The patient was advised to call back or seek an in-person evaluation if the symptoms worsen or if the condition fails to improve as anticipated.  I provided 12 minutes of face-to-face video visit time during this encounter, and > 50% was spent counseling as documented under my assessment & plan.  Eilleen Kempf, MD 08/03/2019 3:23 PM  Disclaimer: This note was dictated with voice recognition software. Similar sounding words can inadvertently be transcribed and may not be corrected upon review.

## 2019-08-07 ENCOUNTER — Telehealth: Payer: Self-pay | Admitting: Internal Medicine

## 2019-08-07 NOTE — Telephone Encounter (Signed)
Scheduled per 11/24 los. Called and left msg. Mailed printout

## 2019-08-14 NOTE — Progress Notes (Signed)
Carelink Summary Report / Loop Recorder 

## 2019-08-19 ENCOUNTER — Ambulatory Visit (INDEPENDENT_AMBULATORY_CARE_PROVIDER_SITE_OTHER): Payer: Medicare Other | Admitting: *Deleted

## 2019-08-19 DIAGNOSIS — I459 Conduction disorder, unspecified: Secondary | ICD-10-CM

## 2019-08-19 LAB — CUP PACEART REMOTE DEVICE CHECK
Date Time Interrogation Session: 20201210130506
Implantable Pulse Generator Implant Date: 20200217

## 2019-09-21 ENCOUNTER — Ambulatory Visit (INDEPENDENT_AMBULATORY_CARE_PROVIDER_SITE_OTHER): Payer: Medicare Other | Admitting: *Deleted

## 2019-09-21 DIAGNOSIS — I459 Conduction disorder, unspecified: Secondary | ICD-10-CM | POA: Diagnosis not present

## 2019-09-21 LAB — CUP PACEART REMOTE DEVICE CHECK
Date Time Interrogation Session: 20210112130821
Implantable Pulse Generator Implant Date: 20200217

## 2019-09-21 NOTE — Progress Notes (Signed)
ILR remote 

## 2019-10-22 ENCOUNTER — Ambulatory Visit (INDEPENDENT_AMBULATORY_CARE_PROVIDER_SITE_OTHER): Payer: Medicare Other | Admitting: *Deleted

## 2019-10-22 DIAGNOSIS — I480 Paroxysmal atrial fibrillation: Secondary | ICD-10-CM

## 2019-10-22 LAB — CUP PACEART REMOTE DEVICE CHECK
Date Time Interrogation Session: 20210212132100
Implantable Pulse Generator Implant Date: 20200217

## 2019-10-23 NOTE — Progress Notes (Signed)
ILR remote 

## 2019-11-22 ENCOUNTER — Ambulatory Visit (INDEPENDENT_AMBULATORY_CARE_PROVIDER_SITE_OTHER): Payer: Medicare Other | Admitting: *Deleted

## 2019-11-22 DIAGNOSIS — I480 Paroxysmal atrial fibrillation: Secondary | ICD-10-CM

## 2019-11-23 LAB — CUP PACEART REMOTE DEVICE CHECK
Date Time Interrogation Session: 20210315142228
Implantable Pulse Generator Implant Date: 20200217

## 2019-12-23 ENCOUNTER — Ambulatory Visit (INDEPENDENT_AMBULATORY_CARE_PROVIDER_SITE_OTHER): Payer: Medicare Other | Admitting: *Deleted

## 2019-12-23 DIAGNOSIS — I480 Paroxysmal atrial fibrillation: Secondary | ICD-10-CM | POA: Diagnosis not present

## 2019-12-23 LAB — CUP PACEART REMOTE DEVICE CHECK
Date Time Interrogation Session: 20210415142623
Implantable Pulse Generator Implant Date: 20200217

## 2019-12-24 NOTE — Progress Notes (Signed)
ILR Remote 

## 2020-01-24 ENCOUNTER — Ambulatory Visit (INDEPENDENT_AMBULATORY_CARE_PROVIDER_SITE_OTHER): Payer: Medicare Other | Admitting: *Deleted

## 2020-01-24 DIAGNOSIS — I480 Paroxysmal atrial fibrillation: Secondary | ICD-10-CM

## 2020-01-24 LAB — CUP PACEART REMOTE DEVICE CHECK
Date Time Interrogation Session: 20210516143052
Implantable Pulse Generator Implant Date: 20200217

## 2020-01-24 NOTE — Progress Notes (Signed)
Carelink Summary Report / Loop Recorder 

## 2020-01-28 ENCOUNTER — Ambulatory Visit (HOSPITAL_COMMUNITY)
Admission: RE | Admit: 2020-01-28 | Discharge: 2020-01-28 | Disposition: A | Discharge status: Home / Self Care | Payer: Medicare Other | Source: Ambulatory Visit | Attending: Internal Medicine | Admitting: Internal Medicine

## 2020-01-28 ENCOUNTER — Other Ambulatory Visit: Payer: Medicare Other

## 2020-01-28 ENCOUNTER — Other Ambulatory Visit: Payer: Self-pay

## 2020-01-28 ENCOUNTER — Inpatient Hospital Stay: Payer: Medicare Other | Attending: Internal Medicine

## 2020-01-28 DIAGNOSIS — C3490 Malignant neoplasm of unspecified part of unspecified bronchus or lung: Secondary | ICD-10-CM | POA: Diagnosis present

## 2020-01-28 DIAGNOSIS — Z902 Acquired absence of lung [part of]: Secondary | ICD-10-CM | POA: Insufficient documentation

## 2020-01-28 DIAGNOSIS — Z85118 Personal history of other malignant neoplasm of bronchus and lung: Secondary | ICD-10-CM | POA: Diagnosis not present

## 2020-01-28 LAB — CMP (CANCER CENTER ONLY)
ALT: 9 U/L (ref 0–44)
AST: 15 U/L (ref 15–41)
Albumin: 4 g/dL (ref 3.5–5.0)
Alkaline Phosphatase: 74 U/L (ref 38–126)
Anion gap: 10 (ref 5–15)
BUN: 14 mg/dL (ref 8–23)
CO2: 26 mmol/L (ref 22–32)
Calcium: 9.8 mg/dL (ref 8.9–10.3)
Chloride: 103 mmol/L (ref 98–111)
Creatinine: 0.84 mg/dL (ref 0.44–1.00)
GFR, Est AFR Am: >60 mL/min (ref >60)
GFR, Estimated: >60 mL/min (ref >60)
Glucose, Bld: 113 mg/dL — ABNORMAL HIGH (ref 70–99)
Potassium: 4.7 mmol/L (ref 3.5–5.1)
Sodium: 139 mmol/L (ref 135–145)
Total Bilirubin: 0.6 mg/dL (ref 0.3–1.2)
Total Protein: 6.9 g/dL (ref 6.5–8.1)

## 2020-01-28 LAB — CBC WITH DIFFERENTIAL (CANCER CENTER ONLY)
Abs Immature Granulocytes: 0.03 10*3/uL (ref 0.00–0.07)
Basophils Absolute: 0 10*3/uL (ref 0.0–0.1)
Basophils Relative: 0 %
Eosinophils Absolute: 0.1 10*3/uL (ref 0.0–0.5)
Eosinophils Relative: 1 %
HCT: 44.6 % (ref 36.0–46.0)
Hemoglobin: 15 g/dL (ref 12.0–15.0)
Immature Granulocytes: 0 %
Lymphocytes Relative: 22 %
Lymphs Abs: 2 10*3/uL (ref 0.7–4.0)
MCH: 32.3 pg (ref 26.0–34.0)
MCHC: 33.6 g/dL (ref 30.0–36.0)
MCV: 95.9 fL (ref 80.0–100.0)
Monocytes Absolute: 0.6 10*3/uL (ref 0.1–1.0)
Monocytes Relative: 6 %
Neutro Abs: 6.4 10*3/uL (ref 1.7–7.7)
Neutrophils Relative %: 71 %
Platelet Count: 200 10*3/uL (ref 150–400)
RBC: 4.65 MIL/uL (ref 3.87–5.11)
RDW: 12.4 % (ref 11.5–15.5)
WBC Count: 9 10*3/uL (ref 4.0–10.5)
nRBC: 0 % (ref 0.0–0.2)

## 2020-01-28 MED ORDER — SODIUM CHLORIDE (PF) 0.9 % IJ SOLN
INTRAMUSCULAR | Status: AC
  Filled 2020-01-28: qty 50

## 2020-01-28 MED ORDER — IOHEXOL 300 MG/ML  SOLN
75.0000 mL | Freq: Once | INTRAMUSCULAR | Status: AC | PRN
  Administered 2020-01-28: 75 mL via INTRAVENOUS

## 2020-01-31 ENCOUNTER — Telehealth: Payer: Self-pay | Admitting: Medical Oncology

## 2020-01-31 ENCOUNTER — Telehealth: Payer: Self-pay | Admitting: Internal Medicine

## 2020-01-31 ENCOUNTER — Inpatient Hospital Stay: Payer: Medicare Other | Admitting: Internal Medicine

## 2020-01-31 NOTE — Telephone Encounter (Signed)
LVM to return my call to r/s f/u.

## 2020-01-31 NOTE — Telephone Encounter (Signed)
called pt per 5/24 sch message - no answer. Left message for patient to call back for reschedule.

## 2020-02-10 ENCOUNTER — Encounter: Payer: Self-pay | Admitting: Internal Medicine

## 2020-02-10 ENCOUNTER — Other Ambulatory Visit: Payer: Self-pay

## 2020-02-10 ENCOUNTER — Inpatient Hospital Stay: Payer: Medicare Other | Attending: Internal Medicine | Admitting: Internal Medicine

## 2020-02-10 DIAGNOSIS — C3490 Malignant neoplasm of unspecified part of unspecified bronchus or lung: Secondary | ICD-10-CM | POA: Diagnosis not present

## 2020-02-10 DIAGNOSIS — Z85118 Personal history of other malignant neoplasm of bronchus and lung: Secondary | ICD-10-CM | POA: Insufficient documentation

## 2020-02-10 DIAGNOSIS — Z902 Acquired absence of lung [part of]: Secondary | ICD-10-CM | POA: Insufficient documentation

## 2020-02-10 NOTE — Progress Notes (Signed)
[name]   [phone]  OFFICE PROGRESS NOTE  Crystal Noon, MD [name] [id number]  DIAGNOSIS:Stage IA(T1c, N0, M0) non-small cell lung cancer, adenocarcinoma with lymphovascular invasion  PRIOR THERAPY:Status post left upper lobectomy with lymph node dissection in September 2019 under the care of Dr. Roxan Hockey  CURRENT THERAPY:Observation.  INTERVAL HISTORY: [name] 79 y.o. female returns to the clinic today for follow-up visit.  The patient is feeling fine today with no concerning complaints.  She was very anxious about her scan results.  She denied having any chest pain, shortness of breath but has mild cough with no hemoptysis.  She denied having any fever or chills.  She has no nausea, vomiting, diarrhea or constipation.  She denied having any headache or visual changes.  The patient had repeat CT scan of the chest performed recently and she is here for evaluation and discussion of her risk her results.  MEDICAL HISTORY: Past Medical History:  Diagnosis Date  . Anxiety   . Blood glucose elevated 05/27/2012   pt. states that it has only been slightly high  . BP (high blood pressure) 12/03/2011  . Cancer ([name])    skin cancer  . 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 CN (constipation) 12/06/2015  . 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 GERD (gastroesophageal reflux disease)   . Hemorrhoid 12/06/2015  . History of hiatal hernia   . HLD (hyperlipidemia) 12/03/2011  . Hypertension   . Osteopenia 05/30/2015   Overview:  Bone density 05/2015 started fosamax   . Primary localized osteoarthritis of left knee   . Primary localized osteoarthritis of right knee  12/06/2015    ALLERGIES:  is allergic to [employer] tartrate]; sulfa antibiotics; codeine; hydrocodone; and meperidine.  MEDICATIONS:  Current Outpatient Medications  Medication Sig Dispense Refill  . cetirizine (ZYRTEC ALLERGY) 10 MG tablet Take 10 mg by mouth as needed for allergies.    . diphenhydramine-acetaminophen (TYLENOL PM) 25-500 MG TABS tablet Take 2 tablets by mouth at bedtime as needed (for sleep).    . metoprolol succinate (TOPROL-XL) 50 MG 24 hr tablet Take 50 mg by mouth daily. Take with or immediately following a meal.    . Multiple Vitamins-Minerals (MULTIVITAMIN PO) Take 1 tablet by mouth daily.    . polyethylene glycol (MIRALAX / GLYCOLAX) packet Take 8.5 g by mouth every other day.    . simvastatin (ZOCOR) 20 MG tablet Take 20 mg by mouth daily.    . varenicline (CHANTIX) 1 MG tablet Take 1 mg by mouth 2 (two) times daily.     No current facility-administered medications for this visit.    SURGICAL HISTORY:  Past Surgical History:  Procedure Laterality Date  . ABDOMINAL HYSTERECTOMY    . CATARACT EXTRACTION, BILATERAL    . COLONOSCOPY    . EYE SURGERY Bilateral    Cataract  . implantable loop recorder placement  10/26/2018   MDT LINQ Reveal XT ILR implanted for evaluation of palpitations and atrial fibrillation in office by Dr [name], Device SN [id number])   . KNEE SURGERY  05/01/2016   left uncompartmental   . PARTIAL KNEE ARTHROPLASTY Right 12/18/2015   Procedure:  UNICOMPARTMENTAL RIGHT KNEE;  Surgeon: Crystal Saas, MD;  Location: [facility];  Service: Orthopedics;  Laterality: Right;  . PARTIAL KNEE ARTHROPLASTY Left 05/01/2016   Procedure: UNICOMPARTMENTAL KNEE;  Surgeon: Crystal Saas, MD;  Location: [facility];  Service: Orthopedics;  Laterality: Left;  . TOTAL ABDOMINAL HYSTERECTOMY W/ BILATERAL SALPINGOOPHORECTOMY    . VIDEO ASSISTED THORACOSCOPY (VATS)/ LOBECTOMY Left 05/13/2018   Procedure: VIDEO ASSISTED THORACOSCOPY (VATS)/LEFT UPPER LOBECTOMY;  Surgeon:  Crystal Nakayama, MD;  Location: [facility];  Service: Thoracic;  Laterality: Left;    REVIEW OF SYSTEMS:  A comprehensive review of systems was negative except for: Respiratory: positive for cough   PHYSICAL EXAMINATION: General appearance: alert, cooperative and no distress Head: Normocephalic, without obvious abnormality, atraumatic Neck: no adenopathy, no JVD, supple, symmetrical, trachea midline and thyroid not enlarged, symmetric, no tenderness/mass/nodules Lymph nodes: Cervical, supraclavicular, and axillary nodes normal. Resp: clear to auscultation bilaterally Back: symmetric, no curvature. ROM normal. No CVA tenderness. Cardio: regular rate and rhythm, S1, S2 normal, no murmur, click, rub or gallop GI: soft, non-tender; bowel sounds normal; no masses,  no organomegaly Extremities: extremities normal, atraumatic, no cyanosis or edema  ECOG PERFORMANCE STATUS: 1 - Symptomatic but completely ambulatory  Blood pressure (!) 151/91, pulse 73, temperature 97.7 F (36.5 C), temperature source Temporal, resp. rate 18, height 5\' 2"  (1.575 m), weight 141 lb (64 kg), SpO2 99 %.  LABORATORY DATA: Lab Results  Component Value Date   WBC 9.0 01/28/2020   HGB 15.0 01/28/2020   HCT 44.6 01/28/2020   MCV 95.9 01/28/2020   PLT 200 01/28/2020      Chemistry      Component Value Date/Time   NA 139 01/28/2020 1231   K 4.7 01/28/2020 1231   CL 103 01/28/2020 1231   CO2 26 01/28/2020 1231   BUN 14 01/28/2020 1231   CREATININE 0.84 01/28/2020 1231      Component Value Date/Time   CALCIUM 9.8 01/28/2020 1231   ALKPHOS 74 01/28/2020 1231   AST 15 01/28/2020 1231   ALT 9 01/28/2020 1231   BILITOT 0.6 01/28/2020 1231       RADIOGRAPHIC STUDIES: CT Chest W Contrast  Result Date: 01/28/2020 CLINICAL DATA:  Non-small cell lung cancer staging, status post left upper lobectomy EXAM: CT CHEST WITH CONTRAST TECHNIQUE: Multidetector CT imaging of the chest was performed during intravenous  contrast administration. CONTRAST:  81mL OMNIPAQUE IOHEXOL 300 MG/ML  SOLN COMPARISON:  07/30/2019, 01/26/2019, 04/06/2018 FINDINGS: Cardiovascular: Aortic atherosclerosis. Normal heart size. Scattered coronary artery calcification no pericardial effusion. Mediastinum/Nodes: No enlarged mediastinal, hilar, or axillary lymph nodes. Small hiatal hernia. Thyroid gland, trachea, and esophagus demonstrate no significant findings. Lungs/Pleura: Redemonstrated postoperative findings of left upper lobectomy. Moderate centrilobular emphysema. Multiple small pulmonary nodules and ground-glass opacities are present bilaterally. There is a new small nodule and adjacent ground-glass opacities of the left upper lobe measuring up to 3 mm (series 5, image 51). Adjacent irregular nodules of the posteromedial segment right middle lobe are slightly enlarged, measuring 7 mm, previously 5 mm (series 5, image 80, 92). Remaining nodules and opacities are stable compared to prior examination. No pleural effusion or pneumothorax. Upper Abdomen: No acute abnormality. Musculoskeletal: No chest wall mass or suspicious bone lesions identified. IMPRESSION: 1. Redemonstrated postoperative findings of left upper lobectomy. 2. There is a new small nodule and adjacent ground-glass opacities of the left upper lobe measuring up to 3 mm, nonspecific although likely to be infectious or inflammatory given size and distribution. Attention  on follow-up. 3. Adjacent irregular nodules of the posteromedial segment right middle lobe are slightly enlarged, measuring 7 mm, previously 5 mm. These are nonspecific although enlarging nodules are concerning for metachronous lung malignancy or metastatic disease. Attention on follow-up. 4. Additional stable nodules and opacities bilaterally. 5. Emphysema (ICD10-J43.9). 6. Coronary artery disease. Aortic Atherosclerosis (ICD10-I70.0). Electronically Signed   By: Eddie Candle M.D.   On: 01/28/2020 15:49   CUP PACEART  REMOTE DEVICE CHECK  Result Date: 01/24/2020 [facility] summary report received. Battery status OK. Normal device function. No new symptom episodes, tachy episodes, brady, or pause episodes. No new AF episodes. Monthly summary reports and ROV/PRN. [place]   ASSESSMENT AND PLAN: This is a very pleasant 79 years old white female withstage IA(T1c, N0, M0) non-small cell lung cancer, adenocarcinoma with lymphovascular invasion status post left upper lobectomy with lymph node dissection in September 2019 under the care of Dr. Roxan Hockey The patient is currently on observation and she is feeling fine today with no concerning complaints except for cough from seasonal allergy. She had repeat CT scan of the chest performed recently.  I personally and independently reviewed the scans and discussed the results with the patient today. Her scan showed no concerning findings for disease progression but there were tiny pulmonary nodules that need close observation on upcoming imaging studies. I recommended for the patient to continue on observation with repeat CT scan of the chest in 6 months. She was advised to call immediately if she has any concerning symptoms in the interval. The patient voices understanding of current disease status and treatment options and is in agreement with the current care plan.  All questions were answered. The patient knows to call the clinic with any problems, questions or concerns. We can certainly see the patient much sooner if necessary.  Disclaimer: This note was dictated with voice recognition software. Similar sounding words can inadvertently be transcribed and may not be corrected upon review.

## 2020-02-11 IMAGING — CT CT CHEST W/O CM
2 of 3 series · 15 of 36 positions shown, 18 images · non-contrast
Comparison: 04/04/2017

CLINICAL DATA: Followup pulmonary nodules.

EXAM:
CT CHEST WITHOUT CONTRAST
TECHNIQUE: Multidetector CT imaging of the chest was performed following the
standard protocol without IV contrast.

[Series 2: thorax · axial · 0.67mm/px · z∈[-340,-92]mm · 12 of 146 slices shown, 15 images]
[im 11/146  mediastinal]
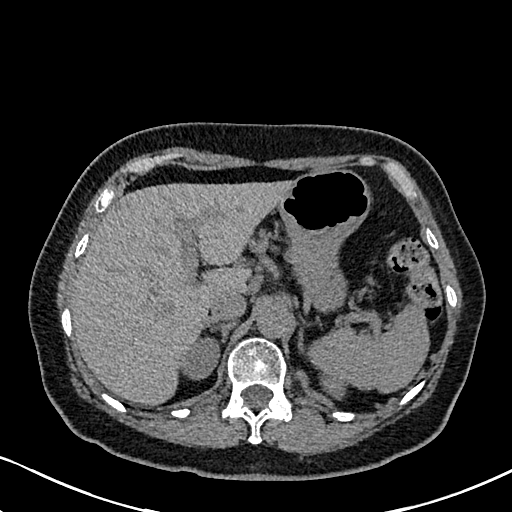
[im 11/146  lung]
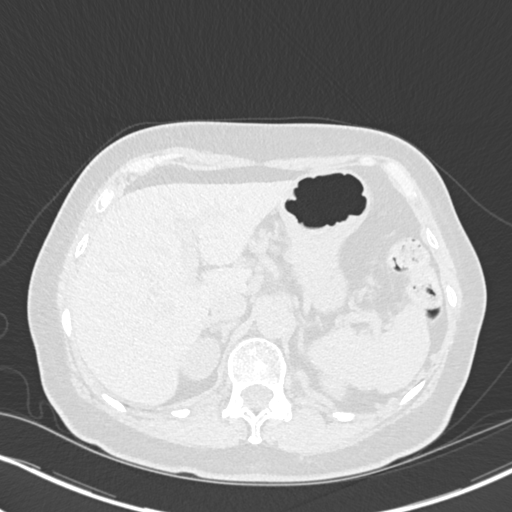
[im 22/146  lung]
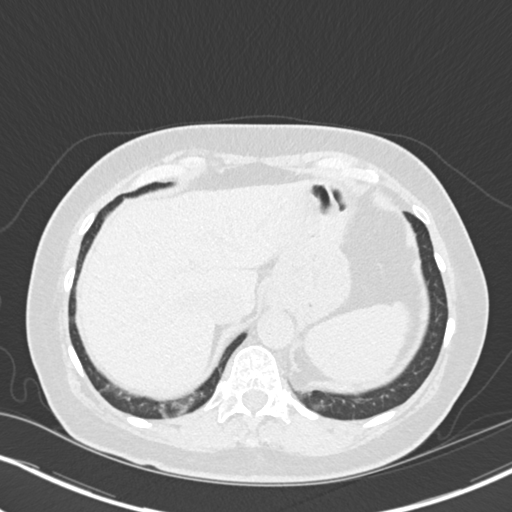
[im 33/146  lung]
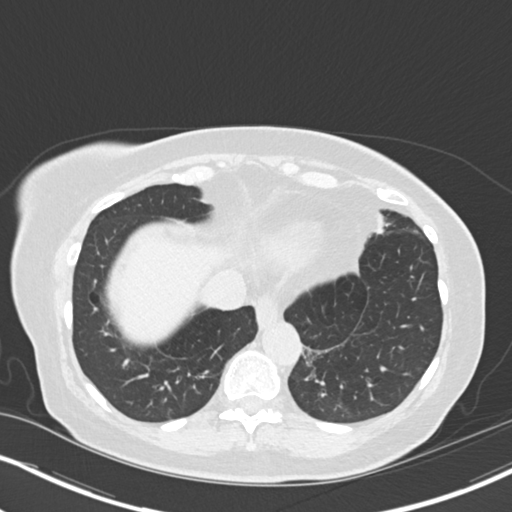
[im 43/146  lung]
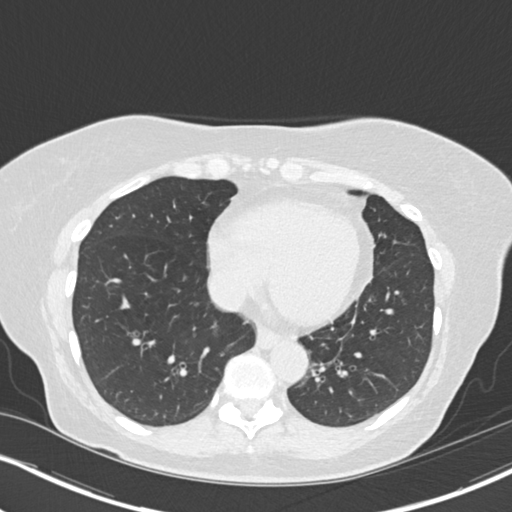
[im 54/146  mediastinal]
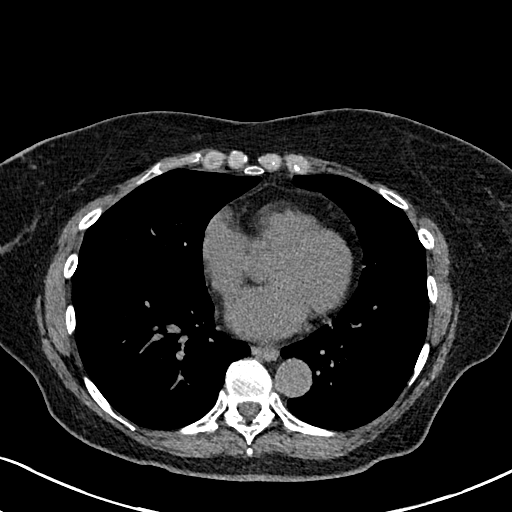
[im 54/146  lung]
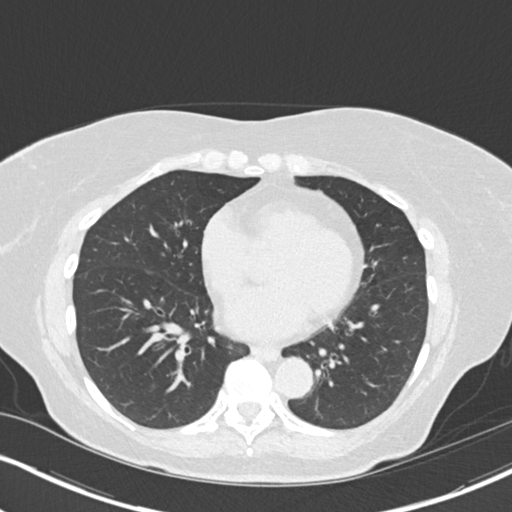
[im 65/146  lung]
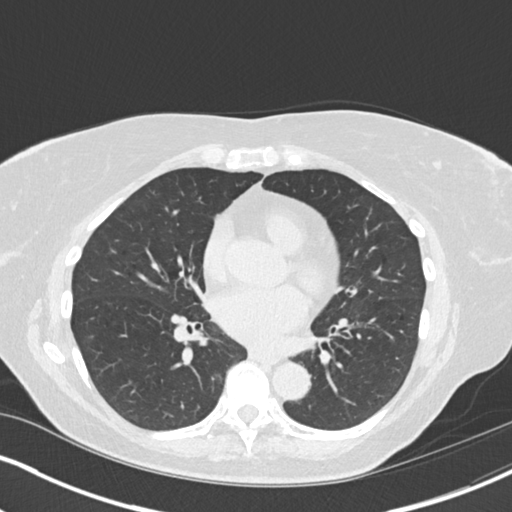
[im 81/146  lung]
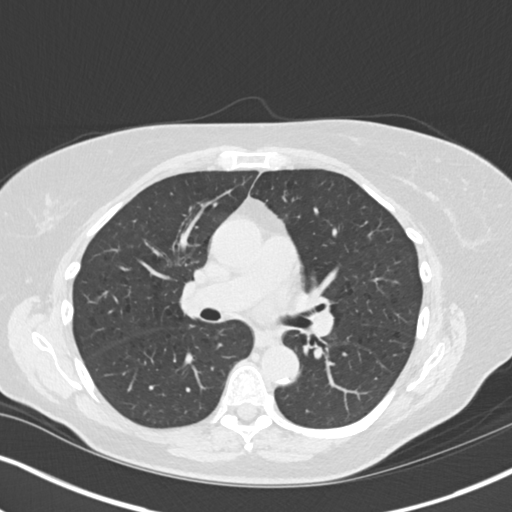
[im 92/146  lung]
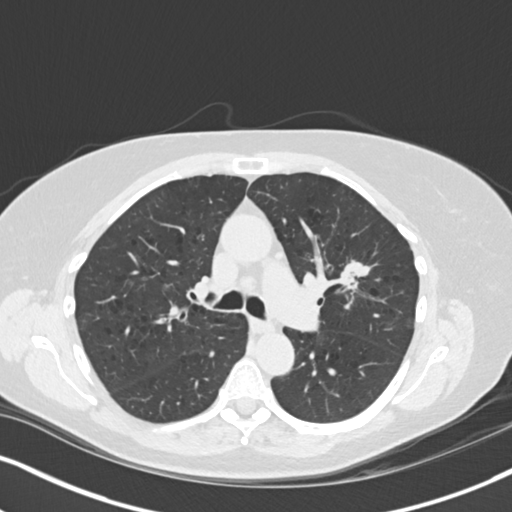
[im 103/146  mediastinal]
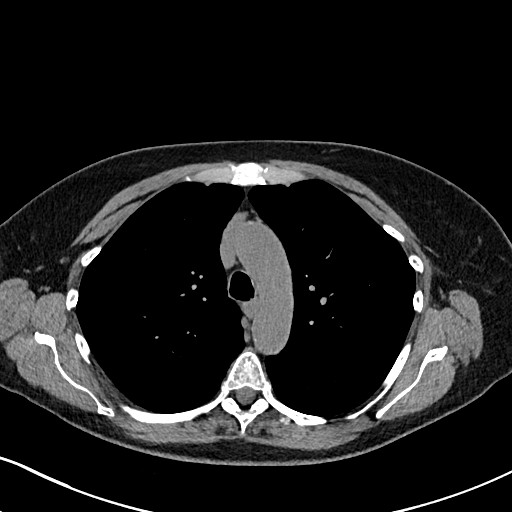
[im 103/146  lung]
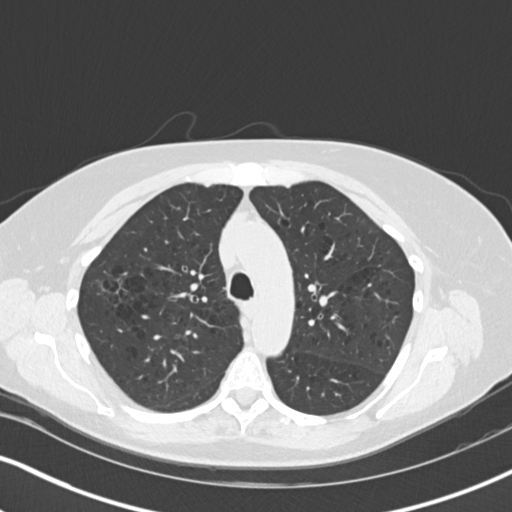
[im 113/146  lung]
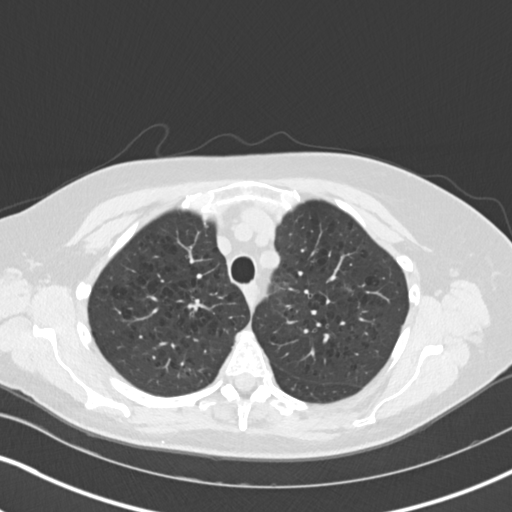
[im 124/146  lung]
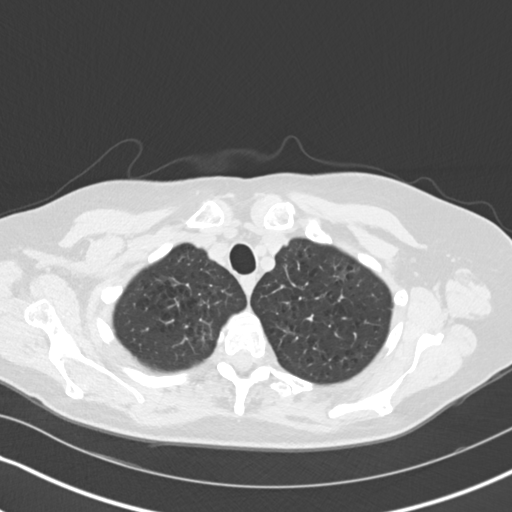
[im 135/146  lung]
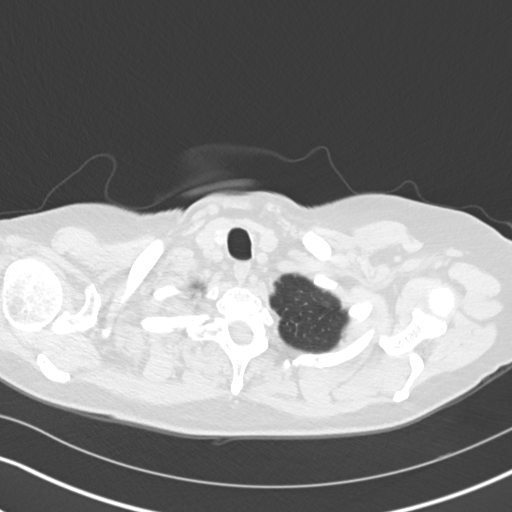

[Series 5: coronal · coronal · 0.59mm/px · 3 of 129 slices shown]
[im 26/129  lung]
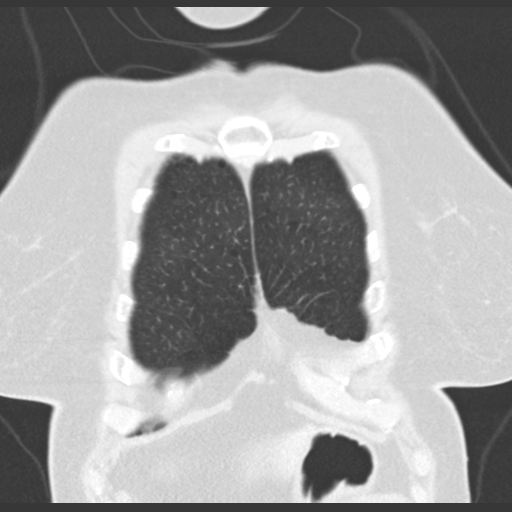
[im 52/129  lung]
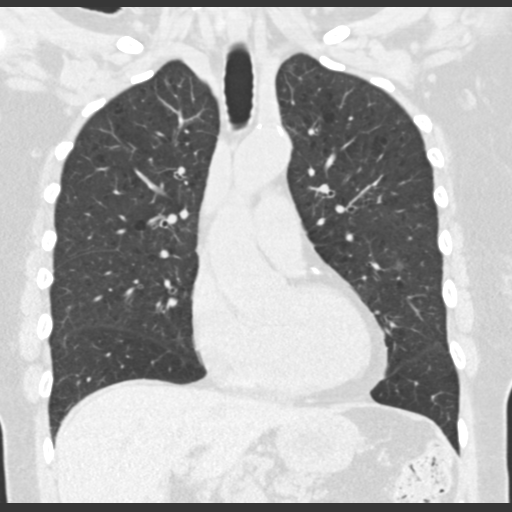
[im 77/129  lung]
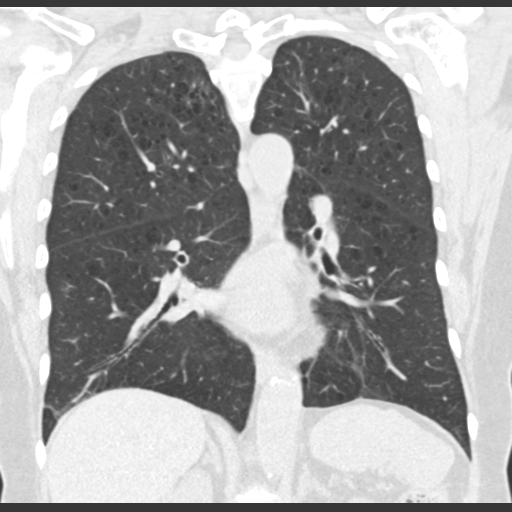

[15 of 36 positions shown; findings below may reference images not displayed]

FINDINGS: Cardiovascular: Normal heart size. No pericardial effusion. Aortic
atherosclerosis noted. Calcification in the LAD and RCA coronary
arteries noted.

Mediastinum/Nodes: Normal appearance of the thyroid gland. The
trachea appears patent and is midline. Normal appearance of the
esophagus. No enlarged mediastinal or hilar lymph nodes.

Lungs/Pleura: Moderate changes of emphysema. Significant enlargement
of spiculated, solid nodule within the central left upper lobe which
now measures 2.1 cm, image 55/3. Previously this measured 0.9 cm.
Ground-glass lesion within the anterior right apex is stable
measuring 1 cm, image [DATE].

Upper Abdomen: Small low-density structure within left lobe of liver
is unchanged measuring 6 mm, image 122/2. 5 mm low-density structure
within the anterior right hepatic lobe is also unchanged. No acute
abnormality identified.

Musculoskeletal: No chest wall mass or suspicious bone lesions
identified.
IMPRESSION: 1. Significant increase in size of solid nodule within the central
left upper lobe now measuring 2.1 cm. Suspicious for primary
bronchogenic carcinoma. Further evaluation with PET-CT and tissue
sampling advise.
2. Stable sub solid lesion within the right upper lobe.
3. Aortic Atherosclerosis (VPTFQ-NOS.S) and Emphysema (VPTFQ-21C.L).
4. LAD and RCA coronary artery atherosclerotic calcifications.

## 2020-02-21 ENCOUNTER — Telehealth: Payer: Self-pay | Admitting: Internal Medicine

## 2020-02-21 NOTE — Telephone Encounter (Signed)
Scheduled per los. Called and left msg. Mailed printout  °

## 2020-02-28 ENCOUNTER — Ambulatory Visit (INDEPENDENT_AMBULATORY_CARE_PROVIDER_SITE_OTHER): Payer: Medicare Other | Admitting: *Deleted

## 2020-02-28 DIAGNOSIS — I480 Paroxysmal atrial fibrillation: Secondary | ICD-10-CM | POA: Diagnosis not present

## 2020-02-28 LAB — CUP PACEART REMOTE DEVICE CHECK
Date Time Interrogation Session: 20210621003535
Implantable Pulse Generator Implant Date: 20200217

## 2020-02-28 NOTE — Progress Notes (Signed)
Carelink Summary Report / Loop Recorder 

## 2020-03-26 IMAGING — DX DG CHEST 1V PORT
1 series · 1 of 1 positions shown · non-contrast
Comparison: 05/19/2018, 05/18/2018 and earlier.

CLINICAL DATA: Follow-up LEFT pneumothorax and subcutaneous
emphysema.

EXAM:
PORTABLE CHEST 1 VIEW

[chest ap]
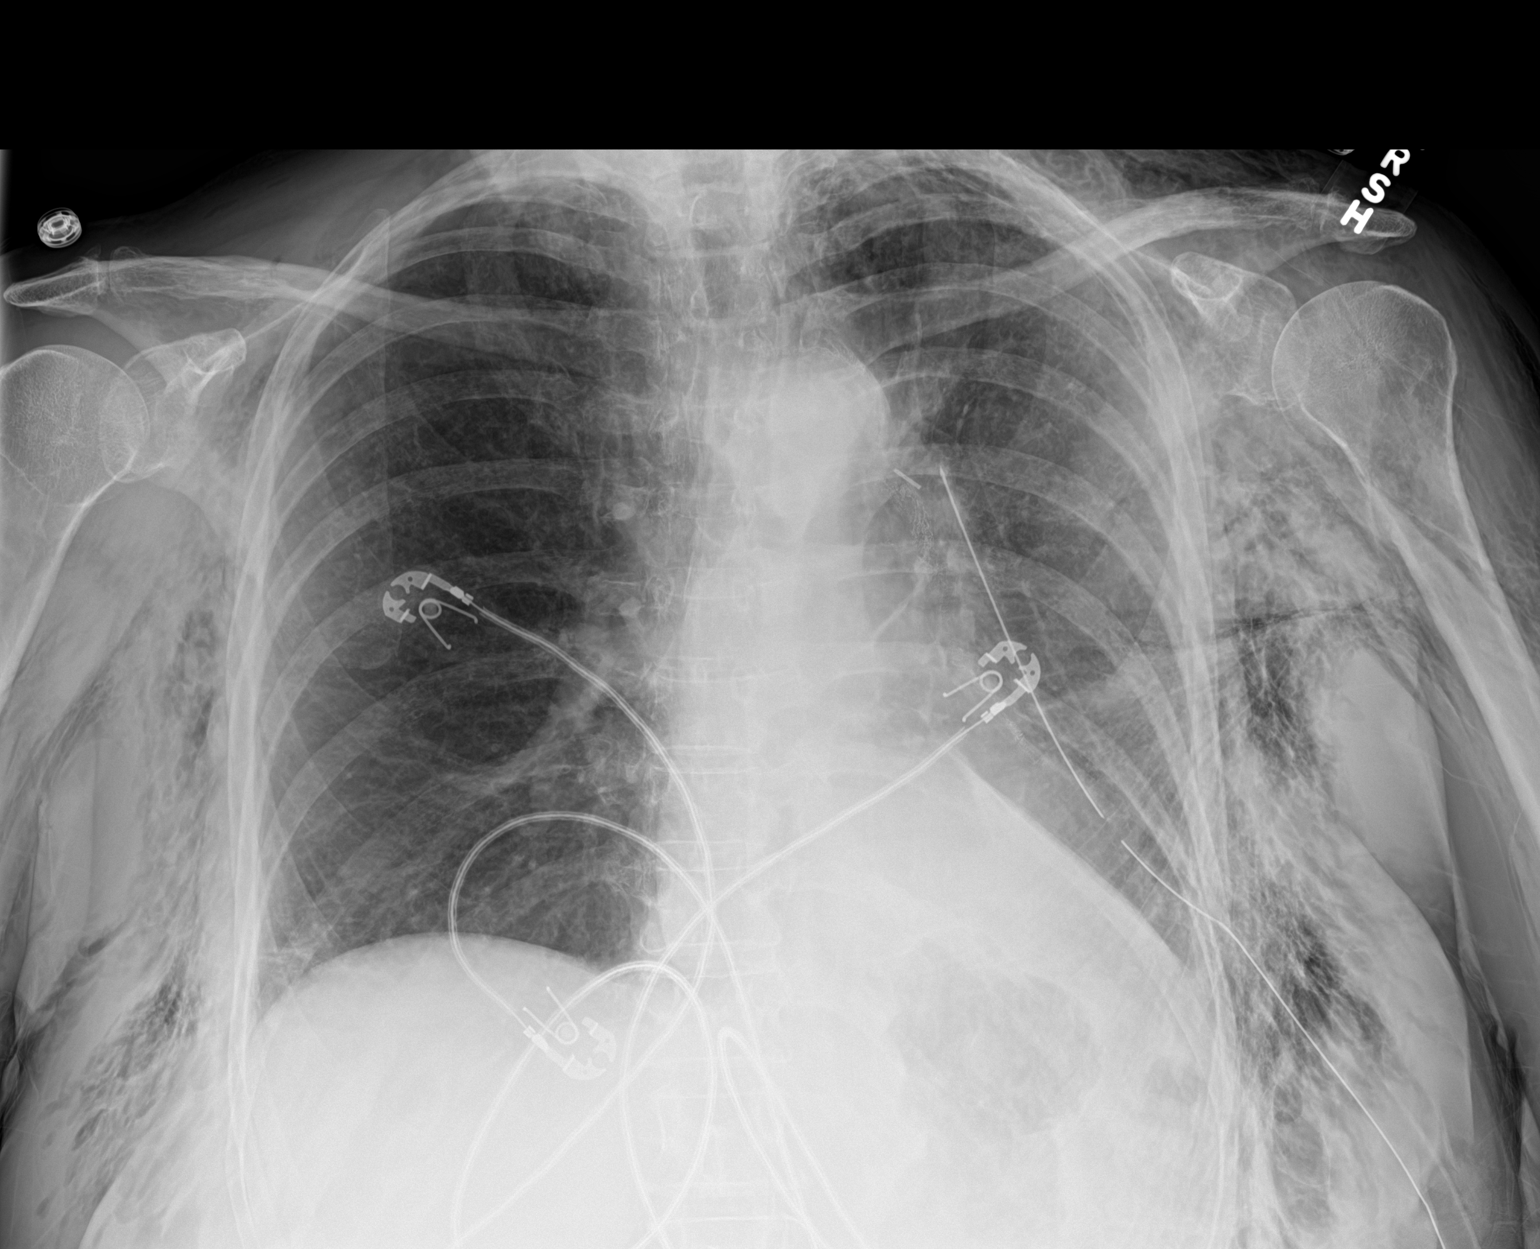

[1 of 1 positions shown; findings below may reference images not displayed]

FINDINGS: Prior LEFT UPPER lobectomy. LEFT chest tube in place with a small
residual apicolateral pneumothorax, 5% or less, unchanged.
Atelectasis in the base of the remaining LEFT LOWER LOBE, unchanged.
RIGHT lung clear apart from scarring at the LATERAL base.
Subcutaneous emphysema throughout the chest wall, neck and UPPER
arms, unchanged. No new abnormalities.
IMPRESSION: 1. Stable small LEFT apicolateral pneumothorax with LEFT chest tube
in place.
2. Stable LEFT LOWER LOBE atelectasis.
3. Stable subcutaneous emphysema.
4. No new abnormalities.

## 2020-03-27 IMAGING — DX DG CHEST 1V PORT
1 series · 1 of 1 positions shown · non-contrast
Comparison: 05/20/2018

CLINICAL DATA: Pneumothorax

EXAM:
PORTABLE CHEST 1 VIEW

[chest ap]
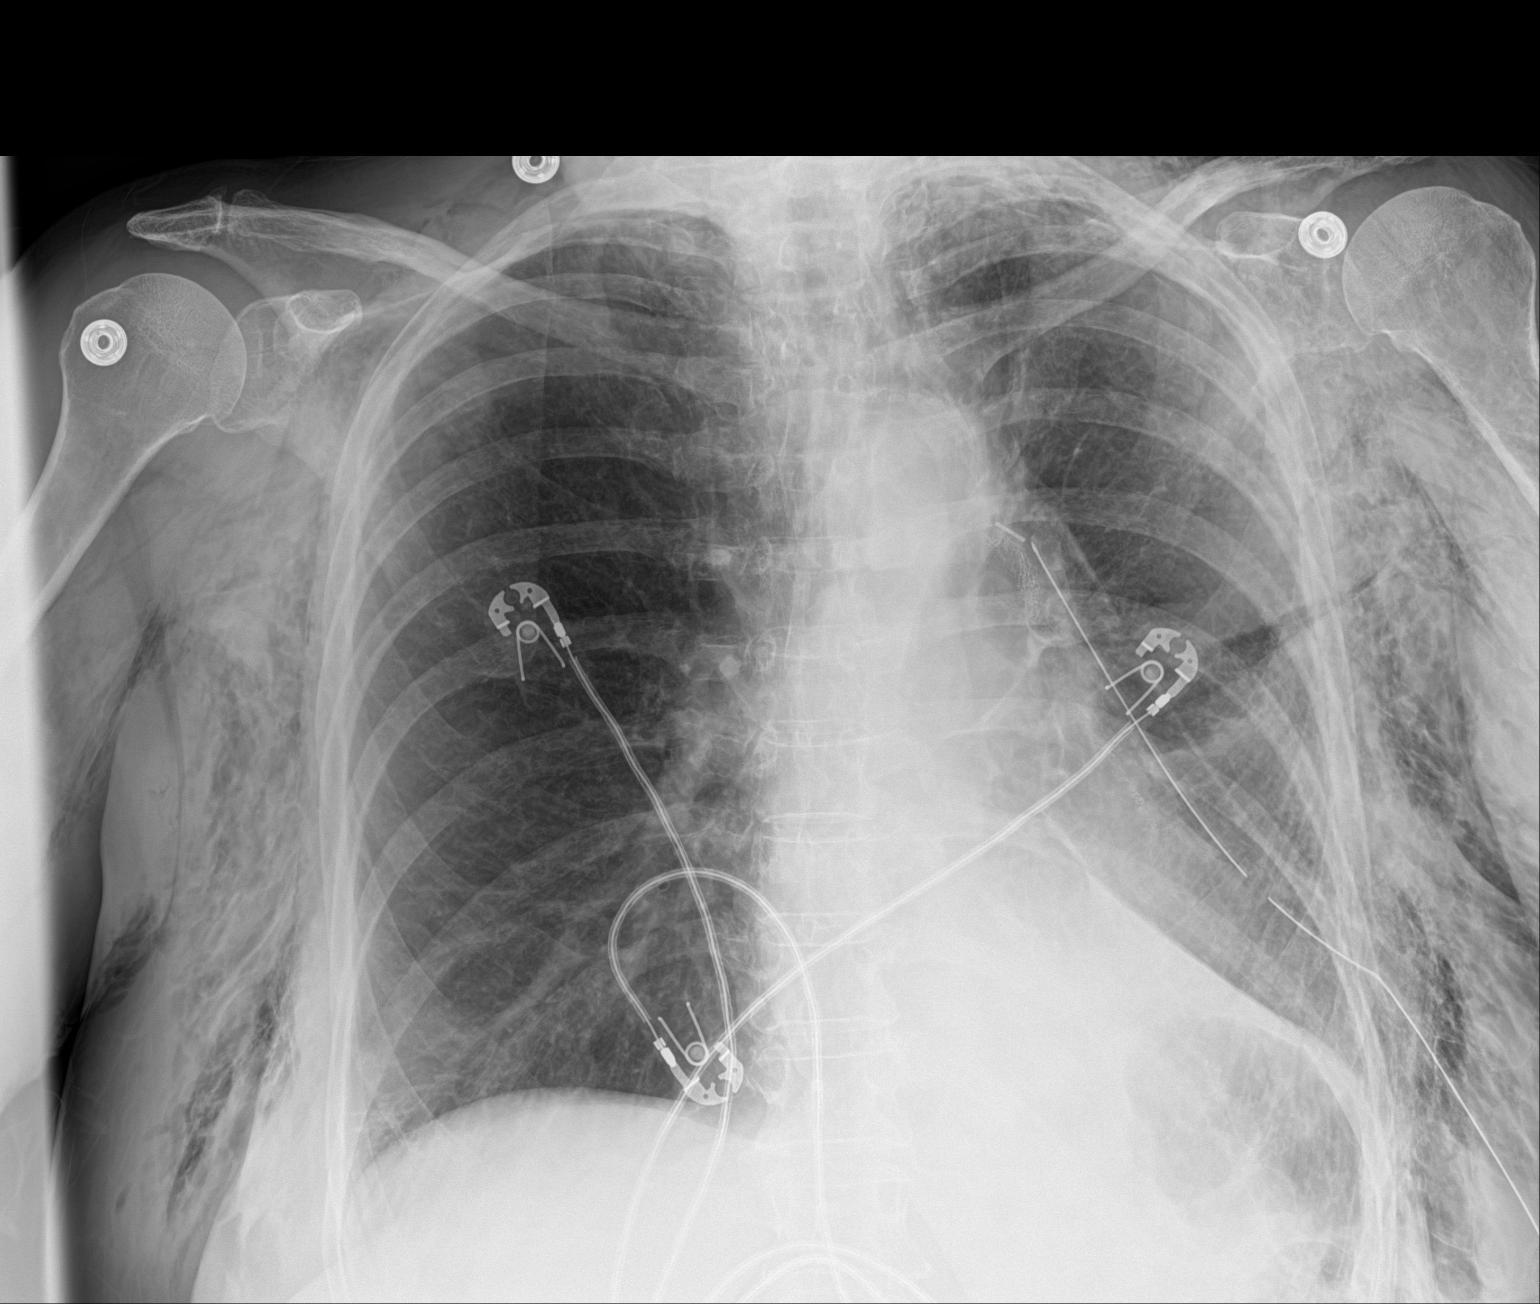

[1 of 1 positions shown; findings below may reference images not displayed]

FINDINGS: Left chest tube remains in place, unchanged. Extensive subcutaneous
emphysema throughout the chest wall bilaterally, left greater than
right. Small residual left apical pneumothorax, stable. Left base
atelectasis or infiltrate. Heart is normal size. Underlying
emphysema.
IMPRESSION: Stable small left apical pneumothorax and extensive subcutaneous
emphysema. Left chest tube remains in place.

Left lower lobe atelectasis or infiltrate.

COPD.

## 2020-03-28 IMAGING — CR DG CHEST 2V
2 series · 2 of 2 positions shown · non-contrast
Comparison: 05/21/2018

CLINICAL DATA: Follow-up pneumothorax with left chest tube removal
yesterday

EXAM:
CHEST - 2 VIEW

[chest pa]
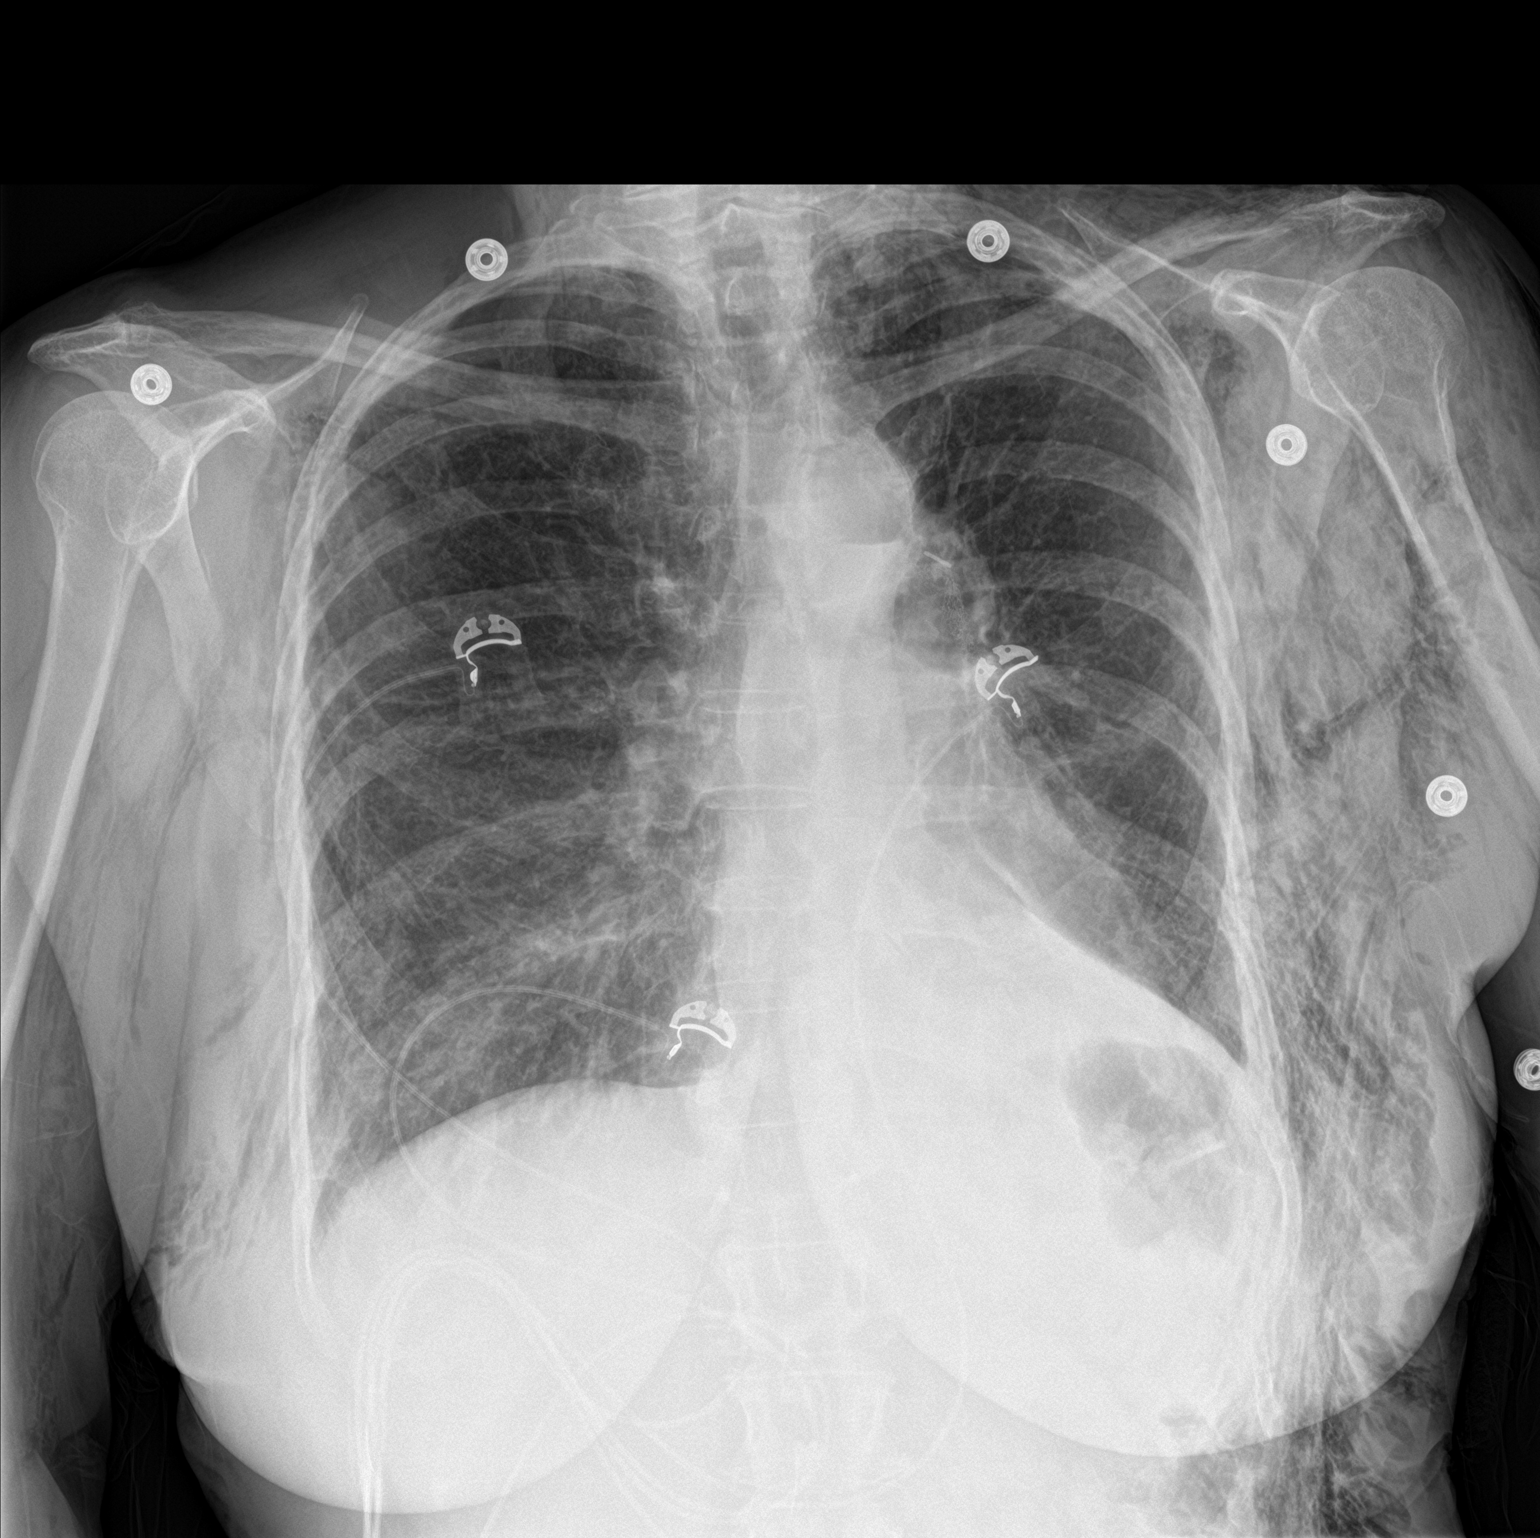

[chest lat]
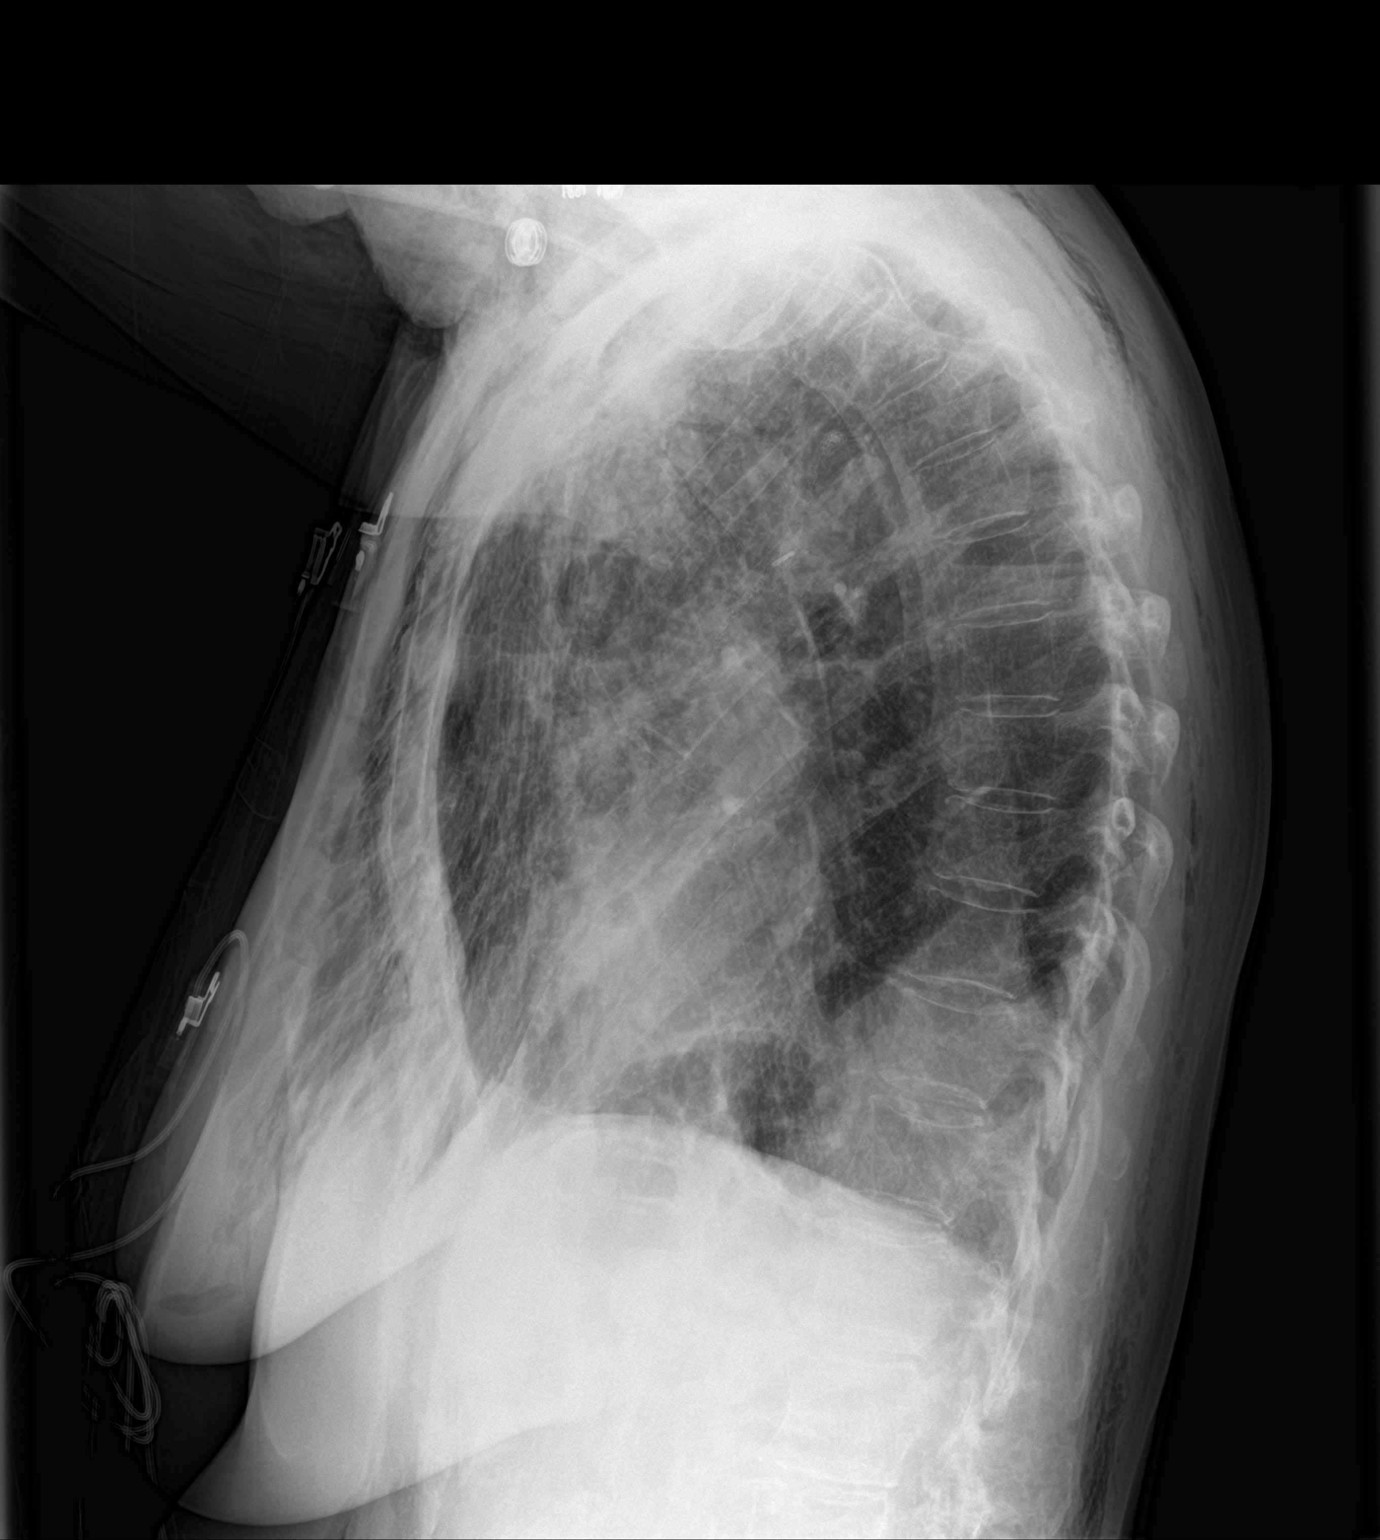

[2 of 2 positions shown; findings below may reference images not displayed]

FINDINGS: The small left apical pneumothorax is again identified but has
decreased somewhat in size when compared with the prior exam.
Extensive subcutaneous emphysema is again noted. Cardiac shadow is
stable. Stable left basilar opacities are seen. No new focal
infiltrate is noted. No bony abnormality is seen.
IMPRESSION: Stable left lower lobe consolidation.

Slight decrease in left apical pneumothorax when compare with the
prior exam.

## 2020-04-03 ENCOUNTER — Ambulatory Visit (INDEPENDENT_AMBULATORY_CARE_PROVIDER_SITE_OTHER): Payer: Medicare Other | Admitting: *Deleted

## 2020-04-03 DIAGNOSIS — I480 Paroxysmal atrial fibrillation: Secondary | ICD-10-CM

## 2020-04-03 LAB — CUP PACEART REMOTE DEVICE CHECK
Date Time Interrogation Session: 20210725233334
Implantable Pulse Generator Implant Date: 20200217

## 2020-04-04 NOTE — Progress Notes (Signed)
Carelink Summary Report / Loop Recorder 

## 2020-05-07 LAB — CUP PACEART REMOTE DEVICE CHECK
Date Time Interrogation Session: 20210827235037
Implantable Pulse Generator Implant Date: 20200217

## 2020-05-08 ENCOUNTER — Ambulatory Visit (INDEPENDENT_AMBULATORY_CARE_PROVIDER_SITE_OTHER): Payer: Medicare Other | Admitting: *Deleted

## 2020-05-08 DIAGNOSIS — I4891 Unspecified atrial fibrillation: Secondary | ICD-10-CM | POA: Diagnosis not present

## 2020-05-09 NOTE — Progress Notes (Signed)
Carelink Summary Report / Loop Recorder 

## 2020-06-08 LAB — CUP PACEART REMOTE DEVICE CHECK
Date Time Interrogation Session: 20210929235125
Implantable Pulse Generator Implant Date: 20200217

## 2020-06-12 ENCOUNTER — Ambulatory Visit (INDEPENDENT_AMBULATORY_CARE_PROVIDER_SITE_OTHER): Payer: Medicare Other

## 2020-06-12 DIAGNOSIS — I4891 Unspecified atrial fibrillation: Secondary | ICD-10-CM | POA: Diagnosis not present

## 2020-06-13 NOTE — Progress Notes (Signed)
Carelink Summary Report / Loop Recorder 

## 2020-07-13 LAB — CUP PACEART REMOTE DEVICE CHECK
Date Time Interrogation Session: 20211101235504
Implantable Pulse Generator Implant Date: 20200217

## 2020-07-17 ENCOUNTER — Ambulatory Visit (INDEPENDENT_AMBULATORY_CARE_PROVIDER_SITE_OTHER): Payer: Medicare Other

## 2020-07-17 DIAGNOSIS — I4891 Unspecified atrial fibrillation: Secondary | ICD-10-CM | POA: Diagnosis not present

## 2020-07-17 NOTE — Progress Notes (Signed)
Carelink Summary Report / Loop Recorder 

## 2020-08-02 ENCOUNTER — Other Ambulatory Visit: Payer: Self-pay

## 2020-08-02 ENCOUNTER — Inpatient Hospital Stay: Payer: Medicare Other | Attending: Internal Medicine

## 2020-08-02 DIAGNOSIS — C3490 Malignant neoplasm of unspecified part of unspecified bronchus or lung: Secondary | ICD-10-CM

## 2020-08-02 DIAGNOSIS — C3412 Malignant neoplasm of upper lobe, left bronchus or lung: Secondary | ICD-10-CM | POA: Diagnosis present

## 2020-08-02 LAB — CMP (CANCER CENTER ONLY)
ALT: 7 U/L (ref 0–44)
AST: 17 U/L (ref 15–41)
Albumin: 3.9 g/dL (ref 3.5–5.0)
Alkaline Phosphatase: 64 U/L (ref 38–126)
Anion gap: 7 (ref 5–15)
BUN: 16 mg/dL (ref 8–23)
CO2: 26 mmol/L (ref 22–32)
Calcium: 9.2 mg/dL (ref 8.9–10.3)
Chloride: 104 mmol/L (ref 98–111)
Creatinine: 0.89 mg/dL (ref 0.44–1.00)
GFR, Estimated: >60 mL/min (ref >60)
Glucose, Bld: 104 mg/dL — ABNORMAL HIGH (ref 70–99)
Potassium: 4.2 mmol/L (ref 3.5–5.1)
Sodium: 137 mmol/L (ref 135–145)
Total Bilirubin: 0.6 mg/dL (ref 0.3–1.2)
Total Protein: 6.5 g/dL (ref 6.5–8.1)

## 2020-08-02 LAB — CBC WITH DIFFERENTIAL (CANCER CENTER ONLY)
Abs Immature Granulocytes: 0.02 10*3/uL (ref 0.00–0.07)
Basophils Absolute: 0 10*3/uL (ref 0.0–0.1)
Basophils Relative: 0 %
Eosinophils Absolute: 0.1 10*3/uL (ref 0.0–0.5)
Eosinophils Relative: 1 %
HCT: 42.8 % (ref 36.0–46.0)
Hemoglobin: 14.1 g/dL (ref 12.0–15.0)
Immature Granulocytes: 0 %
Lymphocytes Relative: 19 %
Lymphs Abs: 1.6 10*3/uL (ref 0.7–4.0)
MCH: 32.3 pg (ref 26.0–34.0)
MCHC: 32.9 g/dL (ref 30.0–36.0)
MCV: 98.2 fL (ref 80.0–100.0)
Monocytes Absolute: 0.5 10*3/uL (ref 0.1–1.0)
Monocytes Relative: 6 %
Neutro Abs: 6.5 10*3/uL (ref 1.7–7.7)
Neutrophils Relative %: 74 %
Platelet Count: 191 10*3/uL (ref 150–400)
RBC: 4.36 MIL/uL (ref 3.87–5.11)
RDW: 12.1 % (ref 11.5–15.5)
WBC Count: 8.8 10*3/uL (ref 4.0–10.5)
nRBC: 0 % (ref 0.0–0.2)

## 2020-08-11 ENCOUNTER — Encounter (HOSPITAL_COMMUNITY): Payer: Self-pay

## 2020-08-11 ENCOUNTER — Other Ambulatory Visit: Payer: Self-pay

## 2020-08-11 ENCOUNTER — Ambulatory Visit (HOSPITAL_COMMUNITY)
Admission: RE | Admit: 2020-08-11 | Discharge: 2020-08-11 | Disposition: A | Discharge status: Home / Self Care | Payer: Medicare Other | Source: Ambulatory Visit | Attending: Internal Medicine | Admitting: Internal Medicine

## 2020-08-11 ENCOUNTER — Other Ambulatory Visit: Payer: Medicare Other

## 2020-08-11 DIAGNOSIS — C3490 Malignant neoplasm of unspecified part of unspecified bronchus or lung: Secondary | ICD-10-CM | POA: Insufficient documentation

## 2020-08-11 HISTORY — DX: Malignant neoplasm of unspecified part of unspecified bronchus or lung: C34.90

## 2020-08-11 MED ORDER — IOHEXOL 300 MG/ML  SOLN
75.0000 mL | Freq: Once | INTRAMUSCULAR | Status: AC | PRN
  Administered 2020-08-11: 75 mL via INTRAVENOUS

## 2020-08-14 ENCOUNTER — Other Ambulatory Visit: Payer: Self-pay

## 2020-08-14 ENCOUNTER — Encounter: Payer: Self-pay | Admitting: Internal Medicine

## 2020-08-14 ENCOUNTER — Inpatient Hospital Stay: Payer: Medicare Other | Attending: Internal Medicine | Admitting: Internal Medicine

## 2020-08-14 VITALS — BP 151/77 | HR 67 | Temp 97.3°F | Resp 18 | Ht 62.0 in | Wt 144.3 lb

## 2020-08-14 DIAGNOSIS — Z85828 Personal history of other malignant neoplasm of skin: Secondary | ICD-10-CM | POA: Insufficient documentation

## 2020-08-14 DIAGNOSIS — C3492 Malignant neoplasm of unspecified part of left bronchus or lung: Secondary | ICD-10-CM | POA: Diagnosis not present

## 2020-08-14 DIAGNOSIS — Z79899 Other long term (current) drug therapy: Secondary | ICD-10-CM | POA: Diagnosis not present

## 2020-08-14 DIAGNOSIS — C3412 Malignant neoplasm of upper lobe, left bronchus or lung: Secondary | ICD-10-CM | POA: Insufficient documentation

## 2020-08-14 DIAGNOSIS — C3490 Malignant neoplasm of unspecified part of unspecified bronchus or lung: Secondary | ICD-10-CM

## 2020-08-14 NOTE — Progress Notes (Signed)
[place] Telephone:(336) [phone]   Fax:(336) [phone]  OFFICE PROGRESS NOTE  [name], MD [place] [address]  DIAGNOSIS:Stage IA(T1c, N0, M0) non-small cell lung cancer, adenocarcinoma with lymphovascular invasion  PRIOR THERAPY:Status post left upper lobectomy with lymph node dissection in September 2019 under the care of Dr. Roxan Hockey  CURRENT THERAPY:Observation.  INTERVAL HISTORY: [name] 79 y.o. female returns to the clinic today for 6 months follow-up visit.  The patient is feeling fine today with no concerning complaints except for fatigue taking care of her ill husband.  She denied having any chest pain, shortness of breath, cough or hemoptysis.  She denied having any nausea, vomiting, diarrhea or constipation.  She denied having any headache or visual changes.  She has no recent weight loss or night sweats.  The patient had repeat CT scan of the chest performed recently and she is here for evaluation and discussion of her risk her results.  MEDICAL HISTORY: Past Medical History:  Diagnosis Date  . Anxiety   . Blood glucose elevated 05/27/2012   pt. states that it has only been slightly high  . BP (high blood pressure) 12/03/2011  . Cancer ([place])    skin cancer  . 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 CN (constipation) 12/06/2015  . 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 GERD (gastroesophageal reflux disease)   . Hemorrhoid 12/06/2015  . History of hiatal hernia   . HLD (hyperlipidemia) 12/03/2011  . Hypertension   . Lung cancer ([place]) dx'd 04/2018  . Osteopenia 05/30/2015   Overview:  Bone density 05/2015 started fosamax   . Primary localized osteoarthritis of left knee   .  Primary localized osteoarthritis of right knee 12/06/2015    ALLERGIES:  is allergic to [employer] tartrate], sulfa antibiotics, codeine, hydrocodone, and meperidine.  MEDICATIONS:  Current Outpatient Medications  Medication Sig Dispense Refill  . cetirizine (ZYRTEC ALLERGY) 10 MG tablet Take 10 mg by mouth as needed for allergies.    Marland Kitchen diclofenac (VOLTAREN) 50 MG EC tablet Take 50 mg by mouth 2 (two) times daily.    . diphenhydramine-acetaminophen (TYLENOL PM) 25-500 MG TABS tablet Take 2 tablets by mouth at bedtime as needed (for sleep).    . metoprolol succinate (TOPROL-XL) 50 MG 24 hr tablet Take 50 mg by mouth daily. Take with or immediately following a meal.    . Multiple Vitamins-Minerals (MULTIVITAMIN PO) Take 1 tablet by mouth daily.    . polyethylene glycol (MIRALAX / GLYCOLAX) packet Take 8.5 g by mouth every other day.    . simvastatin (ZOCOR) 20 MG tablet Take 20 mg by mouth daily.     No current facility-administered medications for this visit.    SURGICAL HISTORY:  Past Surgical History:  Procedure Laterality Date  . ABDOMINAL HYSTERECTOMY    . CATARACT EXTRACTION, BILATERAL    . COLONOSCOPY    . EYE SURGERY Bilateral    Cataract  . implantable loop recorder placement  10/26/2018   MDT LINQ Reveal XT ILR implanted for evaluation of palpitations and atrial fibrillation in office by Dr Rayann Heman, Device SN [id number])   . KNEE SURGERY  05/01/2016   left uncompartmental   .  PARTIAL KNEE ARTHROPLASTY Right 12/18/2015   Procedure: UNICOMPARTMENTAL RIGHT KNEE;  Surgeon: Elsie Saas, MD;  Location: [facility];  Service: Orthopedics;  Laterality: Right;  . PARTIAL KNEE ARTHROPLASTY Left 05/01/2016   Procedure: UNICOMPARTMENTAL KNEE;  Surgeon: Elsie Saas, MD;  Location: [facility];  Service: Orthopedics;  Laterality: Left;  . TOTAL ABDOMINAL HYSTERECTOMY W/ BILATERAL SALPINGOOPHORECTOMY    . VIDEO ASSISTED THORACOSCOPY (VATS)/ LOBECTOMY Left 05/13/2018   Procedure: VIDEO ASSISTED  THORACOSCOPY (VATS)/LEFT UPPER LOBECTOMY;  Surgeon: Melrose Nakayama, MD;  Location: [facility] OR;  Service: Thoracic;  Laterality: Left;    REVIEW OF SYSTEMS:  A comprehensive review of systems was negative.   PHYSICAL EXAMINATION: General appearance: alert, cooperative and no distress Head: Normocephalic, without obvious abnormality, atraumatic Neck: no adenopathy, no JVD, supple, symmetrical, trachea midline and thyroid not enlarged, symmetric, no tenderness/mass/nodules Lymph nodes: Cervical, supraclavicular, and axillary nodes normal. Resp: clear to auscultation bilaterally Back: symmetric, no curvature. ROM normal. No CVA tenderness. Cardio: regular rate and rhythm, S1, S2 normal, no murmur, click, rub or gallop GI: soft, non-tender; bowel sounds normal; no masses,  no organomegaly Extremities: extremities normal, atraumatic, no cyanosis or edema  ECOG PERFORMANCE STATUS: 1 - Symptomatic but completely ambulatory  Blood pressure (!) 151/77, pulse 67, temperature (!) 97.3 F (36.3 C), temperature source Tympanic, resp. rate 18, height 5\' 2"  (1.575 m), weight 144 lb 4.8 oz (65.5 kg), SpO2 99 %.  LABORATORY DATA: Lab Results  Component Value Date   WBC 8.8 08/02/2020   HGB 14.1 08/02/2020   HCT 42.8 08/02/2020   MCV 98.2 08/02/2020   PLT 191 08/02/2020      Chemistry      Component Value Date/Time   NA 137 08/02/2020 0950   K 4.2 08/02/2020 0950   CL 104 08/02/2020 0950   CO2 26 08/02/2020 0950   BUN 16 08/02/2020 0950   CREATININE 0.89 08/02/2020 0950      Component Value Date/Time   CALCIUM 9.2 08/02/2020 0950   ALKPHOS 64 08/02/2020 0950   AST 17 08/02/2020 0950   ALT 7 08/02/2020 0950   BILITOT 0.6 08/02/2020 0950       RADIOGRAPHIC STUDIES: CT Chest W Contrast  Result Date: 08/11/2020 CLINICAL DATA:  Non-small-cell lung cancer.  Restaging. EXAM: CT CHEST WITH CONTRAST TECHNIQUE: Multidetector CT imaging of the chest was performed during intravenous contrast  administration. CONTRAST:  40mL OMNIPAQUE IOHEXOL 300 MG/ML  SOLN COMPARISON:  01/28/2020 FINDINGS: Cardiovascular: The heart size is normal. No substantial pericardial effusion. Coronary artery calcification is evident. Atherosclerotic calcification is noted in the wall of the thoracic aorta. Mediastinum/Nodes: No mediastinal lymphadenopathy. There is no hilar lymphadenopathy. The esophagus has normal imaging features. There is no axillary lymphadenopathy. Lungs/Pleura: Centrilobular emphsyema noted. Are similar in the interval. Small nodular ground-glass opacities identified previously are again noted. The index 7 mm right middle lobe nodule on 85/7 is stable. The second adjacent nodule in the medial right middle lobe has resolved in the interval. No new consolidation. No effusion. Upper Abdomen: Upper normal low-density lesions in the liver and kidneys are similar, likely cysts. Musculoskeletal: No worrisome lytic or sclerotic osseous abnormality. IMPRESSION: 1. The new small nodules identified previously in the left upper lobe are similar in the interval. Consider six-month follow-up to ensure continued stability. 2. 1 of the 2 adjacent 7 mm nodule identified previously in the medial right middle lobe has resolved while the other remains stable. Attention on follow-up recommended. 3.  Emphysema (ICD10-J43.9) and Aortic  Atherosclerosis (ICD10-170.0) Electronically Signed   By: Misty Stanley M.D.   On: 08/11/2020 12:55    ASSESSMENT AND PLAN: This is a very pleasant 79 years old white female withstage IA(T1c, N0, M0) non-small cell lung cancer, adenocarcinoma with lymphovascular invasion status post left upper lobectomy with lymph node dissection in September 2019 under the care of Dr. Roxan Hockey The patient is currently on observation and she is feeling fine today with no concerning complaints except for fatigue. She had repeat CT scan of the chest performed recently.  I personally and independently  reviewed the scans and discussed the results with the patient today. Her scan showed no concerning findings for disease recurrence or metastasis. I recommended for her to continue on observation with repeat CT scan of the chest in 6 months. She was advised to call immediately if she has any concerning symptoms in the interval. The patient voices understanding of current disease status and treatment options and is in agreement with the current care plan.  All questions were answered. The patient knows to call the clinic with any problems, questions or concerns. We can certainly see the patient much sooner if necessary.  Disclaimer: This note was dictated with voice recognition software. Similar sounding words can inadvertently be transcribed and may not be corrected upon review.

## 2020-08-19 LAB — CUP PACEART REMOTE DEVICE CHECK
Date Time Interrogation Session: 20211204225905
Implantable Pulse Generator Implant Date: 20200217

## 2020-08-21 ENCOUNTER — Ambulatory Visit (INDEPENDENT_AMBULATORY_CARE_PROVIDER_SITE_OTHER): Payer: Medicare Other

## 2020-08-21 DIAGNOSIS — I4891 Unspecified atrial fibrillation: Secondary | ICD-10-CM

## 2020-09-04 NOTE — Progress Notes (Signed)
Carelink Summary Report / Loop Recorder 

## 2020-09-25 ENCOUNTER — Ambulatory Visit (INDEPENDENT_AMBULATORY_CARE_PROVIDER_SITE_OTHER): Payer: Medicare Other

## 2020-09-25 DIAGNOSIS — I4891 Unspecified atrial fibrillation: Secondary | ICD-10-CM | POA: Diagnosis not present

## 2020-09-25 LAB — CUP PACEART REMOTE DEVICE CHECK
Date Time Interrogation Session: 20220115235715
Implantable Pulse Generator Implant Date: 20200217

## 2020-10-07 NOTE — Progress Notes (Signed)
Carelink Summary Report / Loop Recorder 

## 2020-10-29 LAB — CUP PACEART REMOTE DEVICE CHECK
Date Time Interrogation Session: 20220217235843
Implantable Pulse Generator Implant Date: 20200217

## 2020-10-30 ENCOUNTER — Ambulatory Visit (INDEPENDENT_AMBULATORY_CARE_PROVIDER_SITE_OTHER): Payer: Medicare Other

## 2020-10-30 DIAGNOSIS — I4891 Unspecified atrial fibrillation: Secondary | ICD-10-CM | POA: Diagnosis not present

## 2020-11-03 NOTE — Progress Notes (Signed)
Carelink Summary Report / Loop Recorder 

## 2020-11-15 ENCOUNTER — Other Ambulatory Visit: Payer: Self-pay | Admitting: Family Medicine

## 2020-11-15 DIAGNOSIS — Z1231 Encounter for screening mammogram for malignant neoplasm of breast: Secondary | ICD-10-CM

## 2020-11-23 ENCOUNTER — Other Ambulatory Visit: Payer: Self-pay

## 2020-11-23 ENCOUNTER — Ambulatory Visit
Admission: RE | Admit: 2020-11-23 | Discharge: 2020-11-23 | Disposition: A | Discharge status: Home / Self Care | Payer: Medicare Other | Source: Ambulatory Visit | Attending: Family Medicine | Admitting: Family Medicine

## 2020-11-23 DIAGNOSIS — Z1231 Encounter for screening mammogram for malignant neoplasm of breast: Secondary | ICD-10-CM

## 2020-12-02 LAB — CUP PACEART REMOTE DEVICE CHECK
Date Time Interrogation Session: 20220323010010
Implantable Pulse Generator Implant Date: 20200217

## 2020-12-04 ENCOUNTER — Ambulatory Visit (INDEPENDENT_AMBULATORY_CARE_PROVIDER_SITE_OTHER): Payer: Medicare Other

## 2020-12-04 DIAGNOSIS — I4891 Unspecified atrial fibrillation: Secondary | ICD-10-CM

## 2020-12-14 NOTE — Progress Notes (Signed)
Carelink Summary Report / Loop Recorder 

## 2021-01-01 ENCOUNTER — Ambulatory Visit (INDEPENDENT_AMBULATORY_CARE_PROVIDER_SITE_OTHER): Payer: Medicare Other

## 2021-01-01 DIAGNOSIS — I4891 Unspecified atrial fibrillation: Secondary | ICD-10-CM | POA: Diagnosis not present

## 2021-01-03 LAB — CUP PACEART REMOTE DEVICE CHECK
Date Time Interrogation Session: 20220425010051
Implantable Pulse Generator Implant Date: 20200217

## 2021-01-17 NOTE — Progress Notes (Signed)
Carelink Summary Report / Loop Recorder 

## 2021-01-26 ENCOUNTER — Other Ambulatory Visit: Payer: Self-pay

## 2021-01-26 ENCOUNTER — Telehealth (INDEPENDENT_AMBULATORY_CARE_PROVIDER_SITE_OTHER): Payer: Medicare Other | Admitting: Interventional Cardiology

## 2021-01-26 ENCOUNTER — Encounter: Payer: Self-pay | Admitting: Interventional Cardiology

## 2021-01-26 VITALS — Ht 62.0 in | Wt 147.0 lb

## 2021-01-26 DIAGNOSIS — E782 Mixed hyperlipidemia: Secondary | ICD-10-CM

## 2021-01-26 DIAGNOSIS — I480 Paroxysmal atrial fibrillation: Secondary | ICD-10-CM

## 2021-01-26 DIAGNOSIS — I700 Atherosclerosis of aorta: Secondary | ICD-10-CM

## 2021-01-26 DIAGNOSIS — I2510 Atherosclerotic heart disease of native coronary artery without angina pectoris: Secondary | ICD-10-CM

## 2021-01-26 DIAGNOSIS — I2584 Coronary atherosclerosis due to calcified coronary lesion: Secondary | ICD-10-CM

## 2021-01-26 MED ORDER — ASPIRIN EC 81 MG PO TBEC
81.0000 mg | DELAYED_RELEASE_TABLET | Freq: Every day | ORAL | 3 refills | Status: DC
Start: 2021-01-26 — End: 2023-07-21

## 2021-01-26 NOTE — Progress Notes (Signed)
Virtual Visit via Telephone Note   This visit type was conducted due to national recommendations for restrictions regarding the COVID-19 Pandemic (e.g. social distancing) in an effort to limit this patient's exposure and mitigate transmission in our community.  Due to her co-morbid illnesses, this patient is at least at moderate risk for complications without adequate follow up.  This format is felt to be most appropriate for this patient at this time.  The patient did not have access to video technology/had technical difficulties with video requiring transitioning to audio format only (telephone).  All issues noted in this document were discussed and addressed.  No physical exam could be performed with this format.  Please refer to the patient's chart for her  consent to telehealth for [facility].    Date:  01/26/2021   ID:  [name], DOB 1941/08/12, MRN [id number] The patient was identified using 2 identifiers.  Patient Location: Home Provider Location: Home Office   PCP:  [name], MD   [facility] Providers Cardiologist:  Larae Grooms, MD     Evaluation Performed:  Follow-Up Visit  Chief Complaint:  PAF  History of Present Illness:    [name] is a 80 y.o. female   who had thoracic surgery in 05/2018 with Dr. Roxan Hockey: Left video-assisted thoracoscopy, left upper lobectomy, mediastinal lymph node dissection, intercostal nerve block by Dr. Roxan Hockey on 05/13/2018.  Stage IA nonsmall-cell carcinoma, left upper lobe (T1, N0).  She had postoperative atrial fibrillation. She spontaneously converted to normal sinus rhythm. She then underwent monitoring. There was a question of a short run of atrial fibrillation versus nonsustained ventricular tachycardia. We then placed her on Eliquis at that point. For the remainder of the monitor which lasted 30 days, she had no further atrial fibrillation. She was asymptomatic with her atrial  fibrillation in the hospital. She was asymptomatic while wearing her monitor.  Eliquis was expensive and she was intolerant.  ILR was planned to detect more AFib.  She saw Dr. Rayann Heman several times.    Her husband passed away in 09-08-19.  Minimal AFib noted on her ILR.  She thinks perhaps one episode in 2020.  She has not been taking any anticoagulation.  Denies : Chest pain. Dizziness. Leg edema. Nitroglycerin use. Orthopnea. Palpitations. Paroxysmal nocturnal dyspnea. Shortness of breath. Syncope.   The patient does not have symptoms concerning for COVID-19 infection (fever, chills, cough, or new shortness of breath).    Past Medical History:  Diagnosis Date  . Anxiety   . Blood glucose elevated 05/27/2012   pt. states that it has only been slightly high  . BP (high blood pressure) 12/03/2011  . Cancer ([place])    skin cancer  . 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 CN (constipation) 12/06/2015  . 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 GERD (gastroesophageal reflux disease)   . Hemorrhoid 12/06/2015  . History of hiatal hernia   . HLD (hyperlipidemia) 12/03/2011  . Hypertension   . Lung cancer ([place]) dx'd 04/2018  . Osteopenia 05/30/2015   Overview:  Bone density 05/2015 started fosamax   . Primary localized osteoarthritis of left knee   . Primary localized osteoarthritis of right knee 12/06/2015   Past Surgical  History:  Procedure Laterality Date  . ABDOMINAL HYSTERECTOMY    . CATARACT EXTRACTION, BILATERAL    . COLONOSCOPY    . EYE SURGERY Bilateral    Cataract  . implantable loop recorder placement  10/26/2018   MDT LINQ Reveal XT ILR implanted for evaluation of palpitations and atrial fibrillation in office by Dr Rayann Heman, Device SN [id number])   .  KNEE SURGERY  05/01/2016   left uncompartmental   . PARTIAL KNEE ARTHROPLASTY Right 12/18/2015   Procedure: UNICOMPARTMENTAL RIGHT KNEE;  Surgeon: Elsie Saas, MD;  Location: [place];  Service: Orthopedics;  Laterality: Right;  . PARTIAL KNEE ARTHROPLASTY Left 05/01/2016   Procedure: UNICOMPARTMENTAL KNEE;  Surgeon: Elsie Saas, MD;  Location: [place];  Service: Orthopedics;  Laterality: Left;  . TOTAL ABDOMINAL HYSTERECTOMY W/ BILATERAL SALPINGOOPHORECTOMY    . VIDEO ASSISTED THORACOSCOPY (VATS)/ LOBECTOMY Left 05/13/2018   Procedure: VIDEO ASSISTED THORACOSCOPY (VATS)/LEFT UPPER LOBECTOMY;  Surgeon: Melrose Nakayama, MD;  Location: [place];  Service: Thoracic;  Laterality: Left;     Current Meds  Medication Sig  . Calcium Carb-Cholecalciferol (CALCIUM-VITAMIN D3) 600-200 MG-UNIT TABS Take by mouth daily.  . cetirizine (ZYRTEC) 10 MG tablet Take 10 mg by mouth as needed for allergies.  . diphenhydramine-acetaminophen (TYLENOL PM) 25-500 MG TABS tablet Take 2 tablets by mouth at bedtime as needed (for sleep).  Marland Kitchen escitalopram (LEXAPRO) 20 MG tablet Take by mouth daily.  . metoprolol succinate (TOPROL-XL) 50 MG 24 hr tablet Take 50 mg by mouth daily. Take with or immediately following a meal.  . Multiple Vitamins-Minerals (MULTIVITAMIN PO) Take 1 tablet by mouth daily.  . polyethylene glycol (MIRALAX / GLYCOLAX) packet Take 8.5 g by mouth every other day.  . simvastatin (ZOCOR) 20 MG tablet Take 20 mg by mouth daily.     Allergies:   Ambien [zolpidem tartrate], Sulfa antibiotics, Codeine, Hydrocodone, and Meperidine   Social History   Tobacco Use  . Smoking status: Former Smoker    Years: 50.00    Types: Cigarettes    Quit date: 02/10/2019    Years since quitting: 1.9  . Smokeless tobacco: Never Used  . Tobacco comment: only one cigarette occasionally  Vaping Use  . Vaping Use: Never used  Substance Use Topics  . Alcohol use: Yes    Comment: social rare  . Drug use: No      Family Hx: The patient's family history includes Breast cancer in her sister; Congestive Heart Failure in her mother.  ROS:   Please see the history of present illness.    Trying to improve diet.  Exercising more.  All other systems reviewed and are negative.   Prior CV studies:   The following studies were reviewed today:  Prior hospital records, prior ECG; 2021 CT scan showing atherosclerosis  Labs/Other Tests and Data Reviewed:    EKG:  An ECG dated Jan 2020 was personally reviewed today and demonstrated:  NSR  Recent Labs: 08/02/2020: ALT 7; BUN 16; Creatinine 0.89; Hemoglobin 14.1; Platelet Count 191; Potassium 4.2; Sodium 137   Recent Lipid Panel No results found for: CHOL, TRIG, HDL, CHOLHDL, LDLCALC, LDLDIRECT  Wt Readings from Last 3 Encounters:  01/26/21 147 lb (66.7 kg)  08/14/20 144 lb 4.8 oz (65.5 kg)  02/10/20 141 lb (64 kg)     Risk Assessment/Calculations:    CHA2DS2-VASc Score = 3  This indicates a 3.2% annual risk of stroke. The patient's score is based upon: CHF History: No HTN History: No Diabetes  History: No Stroke History: No Vascular Disease History: No Age Score: 2 Gender Score: 1     Objective:    Vital Signs:  Ht 5\' 2"  (1.575 m)   Wt 147 lb (66.7 kg)   BMI 26.89 kg/m    VITAL SIGNS:  reviewed GEN:  no acute distress RESPIRATORY:  no shortness of breath NEURO:  alert and oriented x 3, no obvious focal deficit PSYCH:  normal affect limited exam due to telephone format  ASSESSMENT & PLAN:    1. PAF: Maintaining NSR. No anticoagulation despite CHADS vasc of 3.  Still with ILR.  2. Hyperlipidemia: HDL is good.  She improved diet to help with TG. To be repeated with PCP. 3. Coronary artery calcification: On Zocor.  Start aspirin 81 mg daily.   4. Aortic atherosclerosis:  High fiber diet. Avoid processed foods. Continue statin and regular activity.         COVID-19 Education: The signs and symptoms of COVID-19 were  discussed with the patient and how to seek care for testing (follow up with PCP or arrange E-visit).   Time:   Today, I have spent 15 minutes with the patient with telehealth technology discussing the above problems.     Medication Adjustments/Labs and Tests Ordered: Current medicines are reviewed at length with the patient today.  Concerns regarding medicines are outlined above.   Tests Ordered: No orders of the defined types were placed in this encounter.   Medication Changes: No orders of the defined types were placed in this encounter.   Follow Up:  In Person in 1 year(s)  Signed, Larae Grooms, MD  01/26/2021 10:48 AM    [place]

## 2021-01-26 NOTE — Patient Instructions (Signed)
Medication Instructions:  Your physician has recommended you make the following change in your medication: Start enteric coated aspirin 81 mg by mouth daily   *If you need a refill on your cardiac medications before your next appointment, please call your pharmacy*   Lab Work: none If you have labs (blood work) drawn today and your tests are completely normal, you will receive your results only by: [name] MyChart Message (if you have MyChart) OR . A paper copy in the mail If you have any lab test that is abnormal or we need to change your treatment, we will call you to review the results.   Testing/Procedures: none   Follow-Up: [facility] and your health needs are our priority.  As part of our continuing mission to provide you with exceptional heart care, we have created designated Provider Care Teams.  These Care Teams include your primary Cardiologist (physician) and Advanced Practice Providers (APPs -  Physician Assistants and Nurse Practitioners) who all work together to provide you with the care you need, when you need it.  We recommend signing up for the patient portal called "MyChart".  Sign up information is provided on this After Visit Summary.  MyChart is used to connect with patients for Virtual Visits (Telemedicine).  Patients are able to view lab/test results, encounter notes, upcoming appointments, etc.  Non-urgent messages can be sent to your provider as well.   To learn more about what you can do with MyChart, go to [email or web address].    Your next appointment:   12 month(s)  The format for your next appointment:   In Person  Provider:   You may see Dr Irish Lack or one of the following Advanced Practice Providers on your designated Care Team:    Melina Copa, PA-C  Ermalinda Barrios, PA-C    Other Instructions   High-Fiber Eating Plan Fiber, also called dietary fiber, is a type of carbohydrate. It is found foods such as fruits, vegetables, whole grains,  and beans. A high-fiber diet can have many health benefits. Your health care provider may recommend a high-fiber diet to help:  Prevent constipation. Fiber can make your bowel movements more regular.  Lower your cholesterol.  Relieve the following conditions: ? Inflammation of veins in the anus (hemorrhoids). ? Inflammation of specific areas of the digestive tract (uncomplicated diverticulosis). ? A problem of the large intestine, also called the colon, that sometimes causes pain and diarrhea (irritable bowel syndrome, or IBS).  Prevent overeating as part of a weight-loss plan.  Prevent heart disease, type 2 diabetes, and certain cancers. What are tips for following this plan? Reading food labels  Check the nutrition facts label on food products for the amount of dietary fiber. Choose foods that have 5 grams of fiber or more per serving.  The goals for recommended daily fiber intake include: ? Men (age 84 or younger): 34-38 g. ? Men (over age 47): 28-34 g. ? Women (age 49 or younger): 25-28 g. ? Women (over age 42): 22-25 g. Your daily fiber goal is _____________ g.   Shopping  Choose whole fruits and vegetables instead of processed forms, such as apple juice or applesauce.  Choose a wide variety of high-fiber foods such as avocados, lentils, oats, and kidney beans.  Read the nutrition facts label of the foods you choose. Be aware of foods with added fiber. These foods often have high sugar and sodium amounts per serving. Cooking  Use whole-grain flour for baking and cooking.  [name]  with brown rice instead of white rice. Meal planning  Start the day with a breakfast that is high in fiber, such as a cereal that contains 5 g of fiber or more per serving.  Eat breads and cereals that are made with whole-grain flour instead of refined flour or white flour.  Eat brown rice, bulgur wheat, or millet instead of white rice.  Use beans in place of meat in soups, salads, and pasta  dishes.  Be sure that half of the grains you eat each day are whole grains. General information  You can get the recommended daily intake of dietary fiber by: ? Eating a variety of fruits, vegetables, grains, nuts, and beans. ? Taking a fiber supplement if you are not able to take in enough fiber in your diet. It is better to get fiber through food than from a supplement.  Gradually increase how much fiber you consume. If you increase your intake of dietary fiber too quickly, you may have bloating, cramping, or gas.  Drink plenty of water to help you digest fiber.  Choose high-fiber snacks, such as berries, raw vegetables, nuts, and popcorn. What foods should I eat? Fruits Berries. Pears. Apples. Oranges. Avocado. Prunes and raisins. Dried figs. Vegetables Sweet potatoes. Spinach. Kale. Artichokes. Cabbage. Broccoli. Cauliflower. Green peas. Carrots. Squash. Grains Whole-grain breads. Multigrain cereal. Oats and oatmeal. Brown rice. Barley. Bulgur wheat. [place]. Quinoa. Bran muffins. Popcorn. Rye wafer crackers. Meats and other proteins Navy beans, kidney beans, and pinto beans. Soybeans. Split peas. Lentils. Nuts and seeds. Dairy Fiber-fortified yogurt. Beverages Fiber-fortified soy milk. Fiber-fortified orange juice. Other foods Fiber bars. The items listed above may not be a complete list of recommended foods and beverages. Contact a dietitian for more information. What foods should I avoid? Fruits Fruit juice. Cooked, strained fruit. Vegetables Fried potatoes. Canned vegetables. Well-cooked vegetables. Grains White bread. Pasta made with refined flour. White rice. Meats and other proteins Fatty cuts of meat. Fried chicken or fried fish. Dairy Milk. Yogurt. Cream cheese. Sour cream. Fats and oils Butters. Beverages Soft drinks. Other foods Cakes and pastries. The items listed above may not be a complete list of foods and beverages to avoid. Talk with your dietitian  about what choices are best for you. Summary  Fiber is a type of carbohydrate. It is found in foods such as fruits, vegetables, whole grains, and beans.  A high-fiber diet has many benefits. It can help to prevent constipation, lower blood cholesterol, aid weight loss, and reduce your risk of heart disease, diabetes, and certain cancers.  Increase your intake of fiber gradually. Increasing fiber too quickly may cause cramping, bloating, and gas. Drink plenty of water while you increase the amount of fiber you consume.  The best sources of fiber include whole fruits and vegetables, whole grains, nuts, seeds, and beans. This information is not intended to replace advice given to you by your health care provider. Make sure you discuss any questions you have with your health care provider. Document Revised: 12/30/2019 Document Reviewed: 12/30/2019 Elsevier Patient Education  2021 [employer].

## 2021-02-05 LAB — CUP PACEART REMOTE DEVICE CHECK
Date Time Interrogation Session: 20220528011107
Implantable Pulse Generator Implant Date: 20200217

## 2021-02-06 ENCOUNTER — Ambulatory Visit (INDEPENDENT_AMBULATORY_CARE_PROVIDER_SITE_OTHER): Payer: Medicare Other

## 2021-02-06 DIAGNOSIS — I480 Paroxysmal atrial fibrillation: Secondary | ICD-10-CM

## 2021-02-13 ENCOUNTER — Other Ambulatory Visit: Payer: Self-pay

## 2021-02-13 ENCOUNTER — Inpatient Hospital Stay: Payer: Medicare Other | Attending: Internal Medicine

## 2021-02-13 ENCOUNTER — Encounter (HOSPITAL_COMMUNITY): Payer: Self-pay

## 2021-02-13 ENCOUNTER — Ambulatory Visit (HOSPITAL_COMMUNITY)
Admission: RE | Admit: 2021-02-13 | Discharge: 2021-02-13 | Disposition: A | Discharge status: Home / Self Care | Payer: Medicare Other | Source: Ambulatory Visit | Attending: Internal Medicine | Admitting: Internal Medicine

## 2021-02-13 DIAGNOSIS — C3412 Malignant neoplasm of upper lobe, left bronchus or lung: Secondary | ICD-10-CM | POA: Insufficient documentation

## 2021-02-13 DIAGNOSIS — Z79899 Other long term (current) drug therapy: Secondary | ICD-10-CM | POA: Diagnosis not present

## 2021-02-13 DIAGNOSIS — Z902 Acquired absence of lung [part of]: Secondary | ICD-10-CM | POA: Insufficient documentation

## 2021-02-13 DIAGNOSIS — C3490 Malignant neoplasm of unspecified part of unspecified bronchus or lung: Secondary | ICD-10-CM

## 2021-02-13 LAB — CMP (CANCER CENTER ONLY)
ALT: 9 U/L (ref 0–44)
AST: 17 U/L (ref 15–41)
Albumin: 3.8 g/dL (ref 3.5–5.0)
Alkaline Phosphatase: 59 U/L (ref 38–126)
Anion gap: 8 (ref 5–15)
BUN: 17 mg/dL (ref 8–23)
CO2: 25 mmol/L (ref 22–32)
Calcium: 9.3 mg/dL (ref 8.9–10.3)
Chloride: 105 mmol/L (ref 98–111)
Creatinine: 0.95 mg/dL (ref 0.44–1.00)
GFR, Estimated: >60 mL/min (ref >60)
Glucose, Bld: 102 mg/dL — ABNORMAL HIGH (ref 70–99)
Potassium: 4.4 mmol/L (ref 3.5–5.1)
Sodium: 138 mmol/L (ref 135–145)
Total Bilirubin: 0.7 mg/dL (ref 0.3–1.2)
Total Protein: 6.6 g/dL (ref 6.5–8.1)

## 2021-02-13 LAB — CBC WITH DIFFERENTIAL (CANCER CENTER ONLY)
Abs Immature Granulocytes: 0.02 10*3/uL (ref 0.00–0.07)
Basophils Absolute: 0 10*3/uL (ref 0.0–0.1)
Basophils Relative: 0 %
Eosinophils Absolute: 0.1 10*3/uL (ref 0.0–0.5)
Eosinophils Relative: 2 %
HCT: 42 % (ref 36.0–46.0)
Hemoglobin: 14.3 g/dL (ref 12.0–15.0)
Immature Granulocytes: 0 %
Lymphocytes Relative: 17 %
Lymphs Abs: 1.4 10*3/uL (ref 0.7–4.0)
MCH: 32.9 pg (ref 26.0–34.0)
MCHC: 34 g/dL (ref 30.0–36.0)
MCV: 96.6 fL (ref 80.0–100.0)
Monocytes Absolute: 0.5 10*3/uL (ref 0.1–1.0)
Monocytes Relative: 6 %
Neutro Abs: 6.4 10*3/uL (ref 1.7–7.7)
Neutrophils Relative %: 75 %
Platelet Count: 189 10*3/uL (ref 150–400)
RBC: 4.35 MIL/uL (ref 3.87–5.11)
RDW: 12.2 % (ref 11.5–15.5)
WBC Count: 8.5 10*3/uL (ref 4.0–10.5)
nRBC: 0 % (ref 0.0–0.2)

## 2021-02-15 ENCOUNTER — Other Ambulatory Visit: Payer: Self-pay

## 2021-02-15 ENCOUNTER — Inpatient Hospital Stay (HOSPITAL_BASED_OUTPATIENT_CLINIC_OR_DEPARTMENT_OTHER): Payer: Medicare Other | Admitting: Internal Medicine

## 2021-02-15 VITALS — BP 142/78 | HR 59 | Temp 97.3°F | Resp 20 | Ht 62.0 in | Wt 148.4 lb

## 2021-02-15 DIAGNOSIS — C3492 Malignant neoplasm of unspecified part of left bronchus or lung: Secondary | ICD-10-CM | POA: Diagnosis not present

## 2021-02-15 DIAGNOSIS — C3412 Malignant neoplasm of upper lobe, left bronchus or lung: Secondary | ICD-10-CM | POA: Diagnosis not present

## 2021-02-15 DIAGNOSIS — C3490 Malignant neoplasm of unspecified part of unspecified bronchus or lung: Secondary | ICD-10-CM

## 2021-02-15 NOTE — Progress Notes (Signed)
[place] [phone]   Fax:([phone]) [place] NOTE  Jettie Booze, MD [phone] [address] [place] [address]  DIAGNOSIS: Stage IA (T1c, N0, M0) non-small cell lung cancer, adenocarcinoma with lymphovascular invasion    PRIOR THERAPY: Status post left upper lobectomy with lymph node dissection in September 2019 under the care of Dr. Roxan Hockey   CURRENT THERAPY: Observation.  INTERVAL HISTORY: [name] 80 y.o. female returns to the clinic today for follow-up visit.  The patient is feeling fine today with no concerning complaints. Her husband died recently.  She denied having any current chest pain, shortness of breath, cough or hemoptysis.  She denied having any fever or chills.  She has no nausea, vomiting, diarrhea or constipation.  She has no headache or visual changes.  She is here today for evaluation with repeat CT scan of the chest for restaging of her disease.  MEDICAL HISTORY: Past Medical History:  Diagnosis Date   Anxiety    Blood glucose elevated 05/27/2012   pt. states that it has only been slightly high   BP (high blood pressure) 12/03/2011   Cancer ([place])    skin cancer   Cardiac conduction disorder 12/06/2015   Overview:  Stress test normal  2006 Angiogram normal  Last Assessment & Plan:  Relevant Hx: Course: Daily Update: Today's Plan:    CN (constipation) 12/06/2015   DD (diverticular disease) 12/06/2015   Overview:  Dr Benson Norway  Last Assessment & Plan:  Relevant Hx: Course: Daily Update: Today's Plan:    GERD (gastroesophageal reflux disease)    Hemorrhoid 12/06/2015   History of hiatal hernia    HLD (hyperlipidemia) 12/03/2011   Hypertension    Lung cancer ([place]) dx'd 04/2018   Osteopenia 05/30/2015   Overview:  Bone density 05/2015 started fosamax    Primary localized osteoarthritis of left knee    Primary localized osteoarthritis of right knee 12/06/2015    ALLERGIES:  is allergic to  [employer] tartrate], sulfa antibiotics, codeine, hydrocodone, and meperidine.  MEDICATIONS:  Current Outpatient Medications  Medication Sig Dispense Refill   aspirin EC 81 MG tablet Take 1 tablet (81 mg total) by mouth daily. Swallow whole. 90 tablet 3   Calcium Carb-Cholecalciferol (CALCIUM-VITAMIN D3) 600-200 MG-UNIT TABS Take by mouth daily.     cetirizine (ZYRTEC) 10 MG tablet Take 10 mg by mouth as needed for allergies.     diphenhydramine-acetaminophen (TYLENOL PM) 25-500 MG TABS tablet Take 2 tablets by mouth at bedtime as needed (for sleep).     escitalopram (LEXAPRO) 20 MG tablet Take by mouth daily.     metoprolol succinate (TOPROL-XL) 50 MG 24 hr tablet Take 50 mg by mouth daily. Take with or immediately following a meal.     Multiple Vitamins-Minerals (MULTIVITAMIN PO) Take 1 tablet by mouth daily.     polyethylene glycol (MIRALAX / GLYCOLAX) packet Take 8.5 g by mouth every other day.     simvastatin (ZOCOR) 20 MG tablet Take 20 mg by mouth daily.     No current facility-administered medications for this visit.    SURGICAL HISTORY:  Past Surgical History:  Procedure Laterality Date   ABDOMINAL HYSTERECTOMY     CATARACT EXTRACTION, BILATERAL     COLONOSCOPY     EYE SURGERY Bilateral    Cataract   implantable loop recorder placement  10/26/2018   MDT LINQ Reveal XT ILR implanted for evaluation of palpitations and atrial  fibrillation in office by Dr Rayann Heman, Device SN [id number])    KNEE SURGERY  05/01/2016   left uncompartmental    PARTIAL KNEE ARTHROPLASTY Right 12/18/2015   Procedure: UNICOMPARTMENTAL RIGHT KNEE;  Surgeon: Elsie Saas, MD;  Location: [facility];  Service: Orthopedics;  Laterality: Right;   PARTIAL KNEE ARTHROPLASTY Left 05/01/2016   Procedure: UNICOMPARTMENTAL KNEE;  Surgeon: Elsie Saas, MD;  Location: [facility];  Service: Orthopedics;  Laterality: Left;   TOTAL ABDOMINAL HYSTERECTOMY W/ BILATERAL SALPINGOOPHORECTOMY     VIDEO ASSISTED THORACOSCOPY  (VATS)/ LOBECTOMY Left 05/13/2018   Procedure: VIDEO ASSISTED THORACOSCOPY (VATS)/LEFT UPPER LOBECTOMY;  Surgeon: Melrose Nakayama, MD;  Location: [facility];  Service: Thoracic;  Laterality: Left;    REVIEW OF SYSTEMS:  A comprehensive review of systems was negative.   PHYSICAL EXAMINATION: General appearance: alert, cooperative, and no distress Head: Normocephalic, without obvious abnormality, atraumatic Neck: no adenopathy, no JVD, supple, symmetrical, trachea midline, and thyroid not enlarged, symmetric, no tenderness/mass/nodules Lymph nodes: Cervical, supraclavicular, and axillary nodes normal. Resp: clear to auscultation bilaterally Back: symmetric, no curvature. ROM normal. No CVA tenderness. Cardio: regular rate and rhythm, S1, S2 normal, no murmur, click, rub or gallop GI: soft, non-tender; bowel sounds normal; no masses,  no organomegaly Extremities: extremities normal, atraumatic, no cyanosis or edema  ECOG PERFORMANCE STATUS: 1 - Symptomatic but completely ambulatory  Blood pressure (!) 142/78, pulse (!) 59, temperature (!) 97.3 F (36.3 C), temperature source Tympanic, resp. rate 20, height 5\' 2"  (1.575 m), weight 148 lb 6.4 oz (67.3 kg), SpO2 99 %.  LABORATORY DATA: Lab Results  Component Value Date   WBC 8.5 02/13/2021   HGB 14.3 02/13/2021   HCT 42.0 02/13/2021   MCV 96.6 02/13/2021   PLT 189 02/13/2021      Chemistry      Component Value Date/Time   NA 138 02/13/2021 0957   K 4.4 02/13/2021 0957   CL 105 02/13/2021 0957   CO2 25 02/13/2021 0957   BUN 17 02/13/2021 0957   CREATININE 0.95 02/13/2021 0957      Component Value Date/Time   CALCIUM 9.3 02/13/2021 0957   ALKPHOS 59 02/13/2021 0957   AST 17 02/13/2021 0957   ALT 9 02/13/2021 0957   BILITOT 0.7 02/13/2021 0957       RADIOGRAPHIC STUDIES: CT CHEST WO CONTRAST  Result Date: 02/13/2021 CLINICAL DATA:  Status post left upper lobectomy in 2019 for non-small cell lung cancer. Asymptomatic. EXAM:  CT CHEST WITHOUT CONTRAST TECHNIQUE: Multidetector CT imaging of the chest was performed following the standard protocol without IV contrast. COMPARISON:  08/11/2020 FINDINGS: Cardiovascular: Aortic atherosclerosis. Tortuous thoracic aorta. Normal heart size, without pericardial effusion. Lad and right coronary artery calcification. Mediastinum/Nodes: No mediastinal or definite hilar adenopathy, given limitations of unenhanced CT. Tiny hiatal hernia. Lungs/Pleura: No pleural fluid.  Status post left upper lobectomy. Moderate centrilobular emphysema. Medial right middle lobe nodule measures 6 x 7 mm on 92/7 and is similar to on the prior. Subpleural right lower lobe 3 mm nodule on 85/7 is not readily apparent on the prior. 1-2 mm left apical pulmonary nodule on 39/7 is similar. Scattered areas of right upper lobe interstitial thickening are unchanged. Right apical soft tissue density of 6 mm on 21/7 is similar and may represent an area of pleuroparenchymal scarring. Lateral left lower lobe mild ground-glass nodularity including on 50/7 is similar to on the prior exam, favoring a post infectious or inflammatory etiology. Ill-defined anterior left lower lobe 4 mm  nodule on 47/7 is felt to be similar back to 01/28/2020. Upper Abdomen: Scattered low-density liver lesions are similar, favored to represent cysts. Normal imaged portions of the spleen, pancreas, gallbladder, left kidney, right adrenal gland. Interpolar right renal 2.0 cm low-density lesion is likely a cyst. Mild left adrenal thickening. Abdominal aortic atherosclerosis. Musculoskeletal: Osteopenia. IMPRESSION: 1. Status post left upper lobectomy, without recurrent or metastatic disease. 2. A right lower lobe 3 mm pulmonary nodule is not readily apparent previously. Most likely a subpleural lymph node. Recommend attention on follow-up. Scattered bilateral pulmonary nodules are otherwise similar, favored to be benign. 3. Aortic atherosclerosis (ICD10-I70.0),  coronary artery atherosclerosis and emphysema (ICD10-J43.9). Electronically Signed   By: Abigail Miyamoto M.D.   On: 02/13/2021 16:13   CUP PACEART REMOTE DEVICE CHECK  Result Date: 02/05/2021 ILR summary report received. Battery status OK. Normal device function. No new symptom, tachy, brady, or pause episodes. No new AF episodes. Monthly summary reports and ROV/PRN. HB    ASSESSMENT AND PLAN:  This is a very pleasant 80 years old white female with stage IA (T1c, N0, M0) non-small cell lung cancer, adenocarcinoma with lymphovascular invasion status post left upper lobectomy with lymph node dissection in September 2019 under the care of Dr. Roxan Hockey The patient is currently on observation and she is feeling fine today with no concerning complaints. She had repeat CT scan of the chest performed recently.  I personally and independently reviewed the scan and discussed the results with the patient. Her scan showed no concerning findings for disease recurrence or metastasis. I recommended for her to continue on observation with repeat CT scan of the chest in 1 year. She was advised to call immediately if she has any concerning symptoms in the interval. The patient voices understanding of current disease status and treatment options and is in agreement with the current care plan.  All questions were answered. The patient knows to call the clinic with any problems, questions or concerns. We can certainly see the patient much sooner if necessary.  Disclaimer: This note was dictated with voice recognition software. Similar sounding words can inadvertently be transcribed and may not be corrected upon review.

## 2021-02-21 ENCOUNTER — Telehealth: Payer: Self-pay | Admitting: Internal Medicine

## 2021-02-21 NOTE — Telephone Encounter (Signed)
Scheduled per los. Called and left msg. Mailed printout  °

## 2021-02-28 NOTE — Progress Notes (Signed)
Carelink Summary Report / Loop Recorder 

## 2021-03-09 ENCOUNTER — Ambulatory Visit (INDEPENDENT_AMBULATORY_CARE_PROVIDER_SITE_OTHER): Payer: Medicare Other

## 2021-03-09 DIAGNOSIS — I480 Paroxysmal atrial fibrillation: Secondary | ICD-10-CM

## 2021-03-09 LAB — CUP PACEART REMOTE DEVICE CHECK
Date Time Interrogation Session: 20220630011523
Implantable Pulse Generator Implant Date: 20200217

## 2021-03-28 NOTE — Progress Notes (Signed)
Carelink Summary Report / Loop Recorder 

## 2021-04-10 LAB — CUP PACEART REMOTE DEVICE CHECK
Date Time Interrogation Session: 20220802011943
Implantable Pulse Generator Implant Date: 20200217

## 2021-04-11 ENCOUNTER — Ambulatory Visit (INDEPENDENT_AMBULATORY_CARE_PROVIDER_SITE_OTHER): Payer: Medicare Other

## 2021-04-11 DIAGNOSIS — I480 Paroxysmal atrial fibrillation: Secondary | ICD-10-CM | POA: Diagnosis not present

## 2021-05-07 NOTE — Progress Notes (Signed)
[facility] Summary Report 

## 2021-05-15 ENCOUNTER — Ambulatory Visit (INDEPENDENT_AMBULATORY_CARE_PROVIDER_SITE_OTHER): Payer: Medicare Other

## 2021-05-15 DIAGNOSIS — I4891 Unspecified atrial fibrillation: Secondary | ICD-10-CM | POA: Diagnosis not present

## 2021-05-15 LAB — CUP PACEART REMOTE DEVICE CHECK
Date Time Interrogation Session: 20220904012125
Implantable Pulse Generator Implant Date: 20200217

## 2021-05-23 NOTE — Progress Notes (Signed)
Carelink Summary Report / Loop Recorder 

## 2021-06-18 ENCOUNTER — Ambulatory Visit (INDEPENDENT_AMBULATORY_CARE_PROVIDER_SITE_OTHER): Payer: Medicare Other

## 2021-06-18 DIAGNOSIS — I480 Paroxysmal atrial fibrillation: Secondary | ICD-10-CM

## 2021-06-20 LAB — CUP PACEART REMOTE DEVICE CHECK
Date Time Interrogation Session: 20221007022227
Implantable Pulse Generator Implant Date: 20200217

## 2021-06-26 NOTE — Progress Notes (Signed)
Carelink Summary Report / Loop Recorder 

## 2021-07-19 LAB — CUP PACEART REMOTE DEVICE CHECK
Date Time Interrogation Session: 20221109012200
Implantable Pulse Generator Implant Date: 20200217

## 2021-07-23 ENCOUNTER — Ambulatory Visit (INDEPENDENT_AMBULATORY_CARE_PROVIDER_SITE_OTHER): Payer: Medicare Other

## 2021-07-23 DIAGNOSIS — I480 Paroxysmal atrial fibrillation: Secondary | ICD-10-CM | POA: Diagnosis not present

## 2021-07-30 NOTE — Progress Notes (Signed)
Carelink Summary Report / Loop Recorder 

## 2021-08-08 ENCOUNTER — Ambulatory Visit (INDEPENDENT_AMBULATORY_CARE_PROVIDER_SITE_OTHER): Payer: Medicare Other | Admitting: Internal Medicine

## 2021-08-08 ENCOUNTER — Other Ambulatory Visit: Payer: Self-pay

## 2021-08-08 VITALS — BP 136/72 | HR 68 | Ht 62.0 in | Wt 150.6 lb

## 2021-08-08 DIAGNOSIS — I2584 Coronary atherosclerosis due to calcified coronary lesion: Secondary | ICD-10-CM

## 2021-08-08 DIAGNOSIS — I480 Paroxysmal atrial fibrillation: Secondary | ICD-10-CM

## 2021-08-08 DIAGNOSIS — R002 Palpitations: Secondary | ICD-10-CM | POA: Diagnosis not present

## 2021-08-08 DIAGNOSIS — I2510 Atherosclerotic heart disease of native coronary artery without angina pectoris: Secondary | ICD-10-CM

## 2021-08-08 NOTE — Patient Instructions (Addendum)
Medication Instructions:  Your physician recommends that you continue on your current medications as directed. Please refer to the Current Medication list given to you today. *If you need a refill on your cardiac medications before your next appointment, please call your pharmacy*  Lab Work: None. If you have labs (blood work) drawn today and your tests are completely normal, you will receive your results only by: [place] (if you have MyChart) OR A paper copy in the mail If you have any lab test that is abnormal or we need to change your treatment, we will call you to review the results.  Testing/Procedures: None.  Follow-Up: At [facility], you and your health needs are our priority.  As part of our continuing mission to provide you with exceptional heart care, we have created designated Provider Care Teams.  These Care Teams include your primary Cardiologist (physician) and Advanced Practice Providers (APPs -  Physician Assistants and Nurse Practitioners) who all work together to provide you with the care you need, when you need it.  Your physician wants you to follow-up in: 12 months with    one of the following Advanced Practice Providers on your designated Care Team:    Tommye Standard, PA-C    You will receive a reminder letter in the mail two months in advance. If you don't receive a letter, please call our office to schedule the follow-up appointment.  We recommend signing up for the patient portal called "MyChart".  Sign up information is provided on this After Visit Summary.  MyChart is used to connect with patients for Virtual Visits (Telemedicine).  Patients are able to view lab/test results, encounter notes, upcoming appointments, etc.  Non-urgent messages can be sent to your provider as well.   To learn more about what you can do with MyChart, go to [email or web address].    Any Other Special Instructions Will Be Listed Below (If Applicable).

## 2021-08-08 NOTE — Progress Notes (Signed)
PCP: [name], MD Primary Cardiologist: Dr Irish Lack Primary EP: Dr Rayann Heman  Farrell Ours is a 80 y.o. female who presents today for routine electrophysiology followup.  Since last being seen in our clinic, the patient reports doing very well.  Today, she denies symptoms of palpitations, chest pain, shortness of breath,  lower extremity edema, dizziness, presyncope, or syncope.  The patient is otherwise without complaint today.   Past Medical History:  Diagnosis Date   Anxiety    Blood glucose elevated 05/27/2012   pt. states that it has only been slightly high   BP (high blood pressure) 12/03/2011   Cancer ([place])    skin cancer   Cardiac conduction disorder 12/06/2015   Overview:  Stress test normal  2006 Angiogram normal  Last Assessment & Plan:  Relevant Hx: Course: Daily Update: Today's Plan:    CN (constipation) 12/06/2015   DD (diverticular disease) 12/06/2015   Overview:  Dr Benson Norway  Last Assessment & Plan:  Relevant Hx: Course: Daily Update: Today's Plan:    GERD (gastroesophageal reflux disease)    Hemorrhoid 12/06/2015   History of hiatal hernia    HLD (hyperlipidemia) 12/03/2011   Hypertension    Lung cancer ([place]) dx'd 04/2018   Osteopenia 05/30/2015   Overview:  Bone density 05/2015 started fosamax    Primary localized osteoarthritis of left knee    Primary localized osteoarthritis of right knee 12/06/2015   Past Surgical History:  Procedure Laterality Date   ABDOMINAL HYSTERECTOMY     CATARACT EXTRACTION, BILATERAL     COLONOSCOPY     EYE SURGERY Bilateral    Cataract   implantable loop recorder placement  10/26/2018   MDT LINQ Reveal XT ILR implanted for evaluation of palpitations and atrial fibrillation in office by Dr Rayann Heman, Device SN [id number])    KNEE SURGERY  05/01/2016   left uncompartmental    PARTIAL KNEE ARTHROPLASTY Right 12/18/2015   Procedure: UNICOMPARTMENTAL RIGHT KNEE;  Surgeon: Elsie Saas, MD;  Location: [facility];  Service: Orthopedics;   Laterality: Right;   PARTIAL KNEE ARTHROPLASTY Left 05/01/2016   Procedure: UNICOMPARTMENTAL KNEE;  Surgeon: Elsie Saas, MD;  Location: [facility];  Service: Orthopedics;  Laterality: Left;   TOTAL ABDOMINAL HYSTERECTOMY W/ BILATERAL SALPINGOOPHORECTOMY     VIDEO ASSISTED THORACOSCOPY (VATS)/ LOBECTOMY Left 05/13/2018   Procedure: VIDEO ASSISTED THORACOSCOPY (VATS)/LEFT UPPER LOBECTOMY;  Surgeon: Melrose Nakayama, MD;  Location: [facility];  Service: Thoracic;  Laterality: Left;    ROS- all systems are reviewed and negatives except as per HPI above  Current Outpatient Medications  Medication Sig Dispense Refill   aspirin EC 81 MG tablet Take 1 tablet (81 mg total) by mouth daily. Swallow whole. 90 tablet 3   Calcium Carb-Cholecalciferol (CALCIUM-VITAMIN D3) 600-200 MG-UNIT TABS Take by mouth daily.     cetirizine (ZYRTEC) 10 MG tablet Take 10 mg by mouth as needed for allergies.     diphenhydramine-acetaminophen (TYLENOL PM) 25-500 MG TABS tablet Take 2 tablets by mouth at bedtime as needed (for sleep).     escitalopram (LEXAPRO) 20 MG tablet Take by mouth daily.     metoprolol succinate (TOPROL-XL) 50 MG 24 hr tablet Take 50 mg by mouth daily. Take with or immediately following a meal.     Multiple Vitamins-Minerals (MULTIVITAMIN PO) Take 1 tablet by mouth daily.     polyethylene glycol (MIRALAX / GLYCOLAX) packet Take 8.5 g by mouth every other day.     simvastatin (ZOCOR) 20 MG  tablet Take 20 mg by mouth daily.     No current facility-administered medications for this visit.    Physical Exam: Vitals:   08/08/21 1559  BP: 136/72  Pulse: 68  SpO2: 96%  Weight: 150 lb 9.6 oz (68.3 kg)  Height: 5\' 2"  (1.575 m)    GEN- The patient is well appearing, alert and oriented x 3 today.   Head- normocephalic, atraumatic Eyes-  Sclera clear, conjunctiva pink Ears- hearing intact Oropharynx- clear Lungs- Clear to ausculation bilaterally, normal work of breathing Heart- Regular rate and  rhythm, no murmurs, rubs or gallops, PMI not laterally displaced GI- soft, NT, ND, + BS Extremities- no clubbing, cyanosis, or edema  Wt Readings from Last 3 Encounters:  08/08/21 150 lb 9.6 oz (68.3 kg)  02/15/21 148 lb 6.4 oz (67.3 kg)  01/26/21 147 lb (66.7 kg)    EKG tracing ordered today is personally reviewed and shows sinus with incomplete RBBB  Assessment and Plan:  Afib Well controlled No AF by ILR since implant She will likely not want to have her ILR replaced once it is at RRT.  We will consider [place] if she has increased AF burden Chads2vasc score is at least 3  Return to see EP APP in a year  Thompson Grayer MD, [facility] 08/08/2021 4:00 PM

## 2021-08-27 ENCOUNTER — Ambulatory Visit (INDEPENDENT_AMBULATORY_CARE_PROVIDER_SITE_OTHER): Payer: Medicare Other

## 2021-08-27 DIAGNOSIS — I480 Paroxysmal atrial fibrillation: Secondary | ICD-10-CM

## 2021-08-28 LAB — CUP PACEART REMOTE DEVICE CHECK
Date Time Interrogation Session: 20221212012656
Implantable Pulse Generator Implant Date: 20200217

## 2021-09-05 NOTE — Progress Notes (Signed)
Carelink Summary Report / Loop Recorder 

## 2021-10-01 ENCOUNTER — Ambulatory Visit (INDEPENDENT_AMBULATORY_CARE_PROVIDER_SITE_OTHER): Payer: Medicare Other

## 2021-10-01 DIAGNOSIS — I480 Paroxysmal atrial fibrillation: Secondary | ICD-10-CM | POA: Diagnosis not present

## 2021-10-01 LAB — CUP PACEART REMOTE DEVICE CHECK
Date Time Interrogation Session: 20230122234737
Implantable Pulse Generator Implant Date: 20200217

## 2021-10-11 NOTE — Progress Notes (Signed)
Carelink Summary Report / Loop Recorder 

## 2021-11-02 LAB — CUP PACEART REMOTE DEVICE CHECK
Date Time Interrogation Session: 20230224234744
Implantable Pulse Generator Implant Date: 20200217

## 2021-11-05 ENCOUNTER — Ambulatory Visit (INDEPENDENT_AMBULATORY_CARE_PROVIDER_SITE_OTHER): Payer: Medicare Other

## 2021-11-05 DIAGNOSIS — I480 Paroxysmal atrial fibrillation: Secondary | ICD-10-CM

## 2021-11-08 NOTE — Progress Notes (Signed)
Carelink Summary Report / Loop Recorder 

## 2021-11-23 ENCOUNTER — Other Ambulatory Visit: Payer: Self-pay | Admitting: Family Medicine

## 2021-11-23 DIAGNOSIS — Z1231 Encounter for screening mammogram for malignant neoplasm of breast: Secondary | ICD-10-CM

## 2021-12-04 ENCOUNTER — Other Ambulatory Visit: Payer: Self-pay

## 2021-12-04 ENCOUNTER — Ambulatory Visit
Admission: RE | Admit: 2021-12-04 | Discharge: 2021-12-04 | Disposition: A | Discharge status: Home / Self Care | Payer: Medicare Other | Source: Ambulatory Visit | Attending: Family Medicine | Admitting: Family Medicine

## 2021-12-04 DIAGNOSIS — Z1231 Encounter for screening mammogram for malignant neoplasm of breast: Secondary | ICD-10-CM

## 2021-12-10 ENCOUNTER — Ambulatory Visit (INDEPENDENT_AMBULATORY_CARE_PROVIDER_SITE_OTHER): Payer: Medicare Other

## 2021-12-10 DIAGNOSIS — I480 Paroxysmal atrial fibrillation: Secondary | ICD-10-CM

## 2021-12-10 LAB — CUP PACEART REMOTE DEVICE CHECK
Date Time Interrogation Session: 20230401000639
Implantable Pulse Generator Implant Date: 20200217

## 2021-12-21 NOTE — Progress Notes (Signed)
Carelink Summary Report / Loop Recorder 

## 2022-01-14 ENCOUNTER — Ambulatory Visit (INDEPENDENT_AMBULATORY_CARE_PROVIDER_SITE_OTHER): Payer: Medicare Other

## 2022-01-14 DIAGNOSIS — I480 Paroxysmal atrial fibrillation: Secondary | ICD-10-CM

## 2022-01-14 LAB — CUP PACEART REMOTE DEVICE CHECK
Date Time Interrogation Session: 20230507231950
Implantable Pulse Generator Implant Date: 20200217

## 2022-01-29 NOTE — Progress Notes (Signed)
Carelink Summary Report / Loop Recorder 

## 2022-02-07 ENCOUNTER — Other Ambulatory Visit: Payer: Self-pay | Admitting: Internal Medicine

## 2022-02-07 DIAGNOSIS — C3490 Malignant neoplasm of unspecified part of unspecified bronchus or lung: Secondary | ICD-10-CM

## 2022-02-11 ENCOUNTER — Inpatient Hospital Stay: Payer: Medicare Other

## 2022-02-11 ENCOUNTER — Ambulatory Visit (HOSPITAL_COMMUNITY): Payer: Medicare Other

## 2022-02-13 ENCOUNTER — Inpatient Hospital Stay: Payer: Medicare Other | Admitting: Internal Medicine

## 2022-02-18 ENCOUNTER — Ambulatory Visit (INDEPENDENT_AMBULATORY_CARE_PROVIDER_SITE_OTHER): Payer: Medicare Other

## 2022-02-18 DIAGNOSIS — I480 Paroxysmal atrial fibrillation: Secondary | ICD-10-CM

## 2022-02-19 LAB — CUP PACEART REMOTE DEVICE CHECK
Date Time Interrogation Session: 20230609231937
Implantable Pulse Generator Implant Date: 20200217

## 2022-03-01 ENCOUNTER — Other Ambulatory Visit: Payer: Self-pay | Admitting: Physician Assistant

## 2022-03-01 DIAGNOSIS — C3492 Malignant neoplasm of unspecified part of left bronchus or lung: Secondary | ICD-10-CM

## 2022-03-04 ENCOUNTER — Inpatient Hospital Stay: Payer: Medicare Other | Attending: Internal Medicine

## 2022-03-04 ENCOUNTER — Ambulatory Visit (HOSPITAL_COMMUNITY)
Admission: RE | Admit: 2022-03-04 | Discharge: 2022-03-04 | Disposition: A | Discharge status: Home / Self Care | Payer: Medicare Other | Source: Ambulatory Visit | Attending: Internal Medicine | Admitting: Internal Medicine

## 2022-03-04 DIAGNOSIS — Z85828 Personal history of other malignant neoplasm of skin: Secondary | ICD-10-CM | POA: Diagnosis not present

## 2022-03-04 DIAGNOSIS — C3490 Malignant neoplasm of unspecified part of unspecified bronchus or lung: Secondary | ICD-10-CM | POA: Insufficient documentation

## 2022-03-04 DIAGNOSIS — K449 Diaphragmatic hernia without obstruction or gangrene: Secondary | ICD-10-CM | POA: Diagnosis not present

## 2022-03-04 DIAGNOSIS — I700 Atherosclerosis of aorta: Secondary | ICD-10-CM | POA: Diagnosis not present

## 2022-03-04 DIAGNOSIS — Z882 Allergy status to sulfonamides status: Secondary | ICD-10-CM | POA: Insufficient documentation

## 2022-03-04 DIAGNOSIS — Z90722 Acquired absence of ovaries, bilateral: Secondary | ICD-10-CM | POA: Insufficient documentation

## 2022-03-04 DIAGNOSIS — Z902 Acquired absence of lung [part of]: Secondary | ICD-10-CM | POA: Insufficient documentation

## 2022-03-04 DIAGNOSIS — Z888 Allergy status to other drugs, medicaments and biological substances status: Secondary | ICD-10-CM | POA: Insufficient documentation

## 2022-03-04 DIAGNOSIS — Z885 Allergy status to narcotic agent status: Secondary | ICD-10-CM | POA: Insufficient documentation

## 2022-03-04 DIAGNOSIS — Z79899 Other long term (current) drug therapy: Secondary | ICD-10-CM | POA: Diagnosis not present

## 2022-03-04 DIAGNOSIS — C3412 Malignant neoplasm of upper lobe, left bronchus or lung: Secondary | ICD-10-CM | POA: Diagnosis not present

## 2022-03-04 DIAGNOSIS — J432 Centrilobular emphysema: Secondary | ICD-10-CM | POA: Diagnosis not present

## 2022-03-04 DIAGNOSIS — C3492 Malignant neoplasm of unspecified part of left bronchus or lung: Secondary | ICD-10-CM

## 2022-03-04 LAB — CBC WITH DIFFERENTIAL (CANCER CENTER ONLY)
Abs Immature Granulocytes: 0.04 10*3/uL (ref 0.00–0.07)
Basophils Absolute: 0 10*3/uL (ref 0.0–0.1)
Basophils Relative: 0 %
Eosinophils Absolute: 0.1 10*3/uL (ref 0.0–0.5)
Eosinophils Relative: 1 %
HCT: 41.6 % (ref 36.0–46.0)
Hemoglobin: 14.5 g/dL (ref 12.0–15.0)
Immature Granulocytes: 1 %
Lymphocytes Relative: 20 %
Lymphs Abs: 1.7 10*3/uL (ref 0.7–4.0)
MCH: 33.9 pg (ref 26.0–34.0)
MCHC: 34.9 g/dL (ref 30.0–36.0)
MCV: 97.2 fL (ref 80.0–100.0)
Monocytes Absolute: 0.6 10*3/uL (ref 0.1–1.0)
Monocytes Relative: 6 %
Neutro Abs: 6.3 10*3/uL (ref 1.7–7.7)
Neutrophils Relative %: 72 %
Platelet Count: 198 10*3/uL (ref 150–400)
RBC: 4.28 MIL/uL (ref 3.87–5.11)
RDW: 12.5 % (ref 11.5–15.5)
WBC Count: 8.7 10*3/uL (ref 4.0–10.5)
nRBC: 0 % (ref 0.0–0.2)

## 2022-03-04 LAB — CMP (CANCER CENTER ONLY)
ALT: 9 U/L (ref 0–44)
AST: 16 U/L (ref 15–41)
Albumin: 4.2 g/dL (ref 3.5–5.0)
Alkaline Phosphatase: 61 U/L (ref 38–126)
Anion gap: 6 (ref 5–15)
BUN: 15 mg/dL (ref 8–23)
CO2: 28 mmol/L (ref 22–32)
Calcium: 9.7 mg/dL (ref 8.9–10.3)
Chloride: 103 mmol/L (ref 98–111)
Creatinine: 0.88 mg/dL (ref 0.44–1.00)
GFR, Estimated: >60 mL/min (ref >60)
Glucose, Bld: 106 mg/dL — ABNORMAL HIGH (ref 70–99)
Potassium: 4.2 mmol/L (ref 3.5–5.1)
Sodium: 137 mmol/L (ref 135–145)
Total Bilirubin: 0.7 mg/dL (ref 0.3–1.2)
Total Protein: 6.6 g/dL (ref 6.5–8.1)

## 2022-03-06 ENCOUNTER — Inpatient Hospital Stay (HOSPITAL_BASED_OUTPATIENT_CLINIC_OR_DEPARTMENT_OTHER): Payer: Medicare Other | Admitting: Internal Medicine

## 2022-03-06 ENCOUNTER — Other Ambulatory Visit: Payer: Self-pay

## 2022-03-06 VITALS — BP 139/75 | HR 64 | Temp 98.2°F | Resp 18 | Ht 62.0 in | Wt 150.6 lb

## 2022-03-06 DIAGNOSIS — C3490 Malignant neoplasm of unspecified part of unspecified bronchus or lung: Secondary | ICD-10-CM

## 2022-03-06 DIAGNOSIS — C3412 Malignant neoplasm of upper lobe, left bronchus or lung: Secondary | ICD-10-CM | POA: Diagnosis not present

## 2022-03-06 NOTE — Progress Notes (Signed)
[facility] [phone]   [phone]  OFFICE PROGRESS NOTE  [name], MD [place] [address]  DIAGNOSIS: Stage IA (T1c, N0, M0) non-small cell lung cancer, adenocarcinoma with lymphovascular invasion    PRIOR THERAPY: Status post left upper lobectomy with lymph node dissection in September 2019 under the care of Dr. Roxan Hockey   CURRENT THERAPY: Observation.  INTERVAL HISTORY: [name] 81 y.o. female returns to the clinic today for follow-up visit.  The patient is feeling fine today with no concerning complaints.  She is very active and she enjoyed her time with her older sister who is visiting from [place] last week.  She denied having any current chest pain, shortness of breath, cough or hemoptysis.  She denied having any fever or chills.  She has no nausea, vomiting, diarrhea or constipation.  She has no headache or visual changes.  She had repeat CT scan of the chest performed recently and she is here for evaluation and discussion of her scan results and treatment options.  MEDICAL HISTORY: Past Medical History:  Diagnosis Date   Anxiety    Blood glucose elevated 05/27/2012   pt. 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/2015   Overview:  Dr Benson [place]  Last Assessment & Plan:  Relevant Hx: Course: Daily Update: Today's Plan:    GERD (gastroesophageal reflux disease)    Hemorrhoid 12/06/2015   History of hiatal hernia    HLD (hyperlipidemia) 12/03/2011   Hypertension    Lung cancer ([place]) dx'd 04/2018   Osteopenia 05/30/2015   Overview:  Bone density 05/2015 started fosamax    Primary localized osteoarthritis  of left knee    Primary localized osteoarthritis of right knee 12/06/2015    ALLERGIES:  is allergic to [employer] tartrate], sulfa antibiotics, codeine, hydrocodone, and meperidine.  MEDICATIONS:  Current Outpatient Medications  Medication Sig Dispense Refill   aspirin EC 81 MG tablet Take 1 tablet (81 mg total) by mouth daily. Swallow whole. 90 tablet 3   Calcium Carb-Cholecalciferol (CALCIUM-VITAMIN D3) 600-200 MG-UNIT TABS Take by mouth daily.     cetirizine (ZYRTEC) 10 MG tablet Take 10 mg by mouth as needed for allergies.     diphenhydramine-acetaminophen (TYLENOL PM) 25-500 MG TABS tablet Take 2 tablets by mouth at bedtime as needed (for sleep).     escitalopram (LEXAPRO) 20 MG tablet Take by mouth daily.     metoprolol succinate (TOPROL-XL) 50 MG 24 hr tablet Take 50 mg by mouth daily. Take with or immediately following a meal.     Multiple Vitamins-Minerals (MULTIVITAMIN PO) Take 1 tablet by mouth daily.     polyethylene glycol (MIRALAX / GLYCOLAX) packet Take 8.5 g by mouth every other day.     simvastatin (ZOCOR) 20 MG tablet Take 20 mg by mouth daily.     No current facility-administered medications for this visit.    SURGICAL HISTORY:  Past Surgical History:  Procedure Laterality Date   ABDOMINAL HYSTERECTOMY     CATARACT EXTRACTION, BILATERAL     COLONOSCOPY     EYE SURGERY Bilateral    Cataract  implantable loop recorder placement  10/26/2018   MDT LINQ Reveal XT ILR implanted for evaluation of palpitations and atrial fibrillation in office by Dr Rayann Heman, Device SN [id number])    KNEE SURGERY  05/01/2016   left uncompartmental    PARTIAL KNEE ARTHROPLASTY Right 12/18/2015   Procedure: UNICOMPARTMENTAL RIGHT KNEE;  Surgeon: Elsie Saas, MD;  Location: [facility];  Service: Orthopedics;  Laterality: Right;   PARTIAL KNEE ARTHROPLASTY Left 05/01/2016   Procedure: UNICOMPARTMENTAL KNEE;  Surgeon: Elsie Saas, MD;  [facility];  Service: Orthopedics;  Laterality:  Left;   TOTAL ABDOMINAL HYSTERECTOMY W/ BILATERAL SALPINGOOPHORECTOMY     VIDEO ASSISTED THORACOSCOPY (VATS)/ LOBECTOMY Left 05/13/2018   Procedure: VIDEO ASSISTED THORACOSCOPY (VATS)/LEFT UPPER LOBECTOMY;  Surgeon: Melrose Nakayama, MD;  Location: [facility];  Service: Thoracic;  Laterality: Left;    REVIEW OF SYSTEMS:  A comprehensive review of systems was negative.   PHYSICAL EXAMINATION: General appearance: alert, cooperative, and no distress Head: Normocephalic, without obvious abnormality, atraumatic Neck: no adenopathy, no JVD, supple, symmetrical, trachea midline, and thyroid not enlarged, symmetric, no tenderness/mass/nodules Lymph nodes: Cervical, supraclavicular, and axillary nodes normal. Resp: clear to auscultation bilaterally Back: symmetric, no curvature. ROM normal. No CVA tenderness. Cardio: regular rate and rhythm, S1, S2 normal, no murmur, click, rub or gallop GI: soft, non-tender; bowel sounds normal; no masses,  no organomegaly Extremities: extremities normal, atraumatic, no cyanosis or edema  ECOG PERFORMANCE STATUS: 1 - Symptomatic but completely ambulatory  Blood pressure 139/75, pulse 64, temperature 98.2 F (36.8 C), temperature source Tympanic, resp. rate 18, height 5\' 2"  (1.575 m), weight 150 lb 9.6 oz (68.3 kg), SpO2 97 %.  LABORATORY DATA: Lab Results  Component Value Date   WBC 8.7 03/04/2022   HGB 14.5 03/04/2022   HCT 41.6 03/04/2022   MCV 97.2 03/04/2022   PLT 198 03/04/2022      Chemistry      Component Value Date/Time   NA 137 03/04/2022 1400   K 4.2 03/04/2022 1400   CL 103 03/04/2022 1400   CO2 28 03/04/2022 1400   BUN 15 03/04/2022 1400   CREATININE 0.88 03/04/2022 1400      Component Value Date/Time   CALCIUM 9.7 03/04/2022 1400   ALKPHOS 61 03/04/2022 1400   AST 16 03/04/2022 1400   ALT 9 03/04/2022 1400   BILITOT 0.7 03/04/2022 1400       RADIOGRAPHIC STUDIES: CT Chest Wo Contrast  Result Date: 03/05/2022 CLINICAL DATA:   Primary Cancer Type: Lung Imaging Indication: Routine surveillance Interval therapy since last imaging? No Initial Cancer Diagnosis Date: 05/13/2018; Established by: Biopsy-proven Detailed Pathology: Stage IA non-small cell lung cancer, adenocarcinoma. Primary Tumor location: Left upper lobe. Surgeries: Left upper lobe lobectomy 05/13/2018. Chemotherapy: No Immunotherapy? No Radiation therapy? No * Tracking Code: BO * EXAM: CT CHEST WITHOUT CONTRAST TECHNIQUE: Multidetector CT imaging of the chest was performed following the standard protocol without IV contrast. RADIATION DOSE REDUCTION: This exam was performed according to the departmental dose-optimization program which includes automated exposure control, adjustment of the mA and/or kV according to patient size and/or use of iterative reconstruction technique. COMPARISON:  Most recent CT chest 02/13/2021 FINDINGS: Cardiovascular: No acute findings. Aortic and coronary atherosclerotic calcification incidentally noted. Mediastinum/Nodes: No masses or pathologically enlarged lymph nodes identified on this unenhanced exam. Lungs/Pleura: Stable postop changes from previous left upper lobectomy. Mild-to-moderate centrilobular emphysema again noted. Asymmetric right apical scarring is unchanged. 7 mm pulmonary nodule in the medial right middle lobe on  image 93/5 remains stable. Tiny less than 5 mm subpleural nodules in the posterior right lower lobe (e.g. Image 82/5) are also stable. No new or enlarging pulmonary nodules or masses identified. No evidence of acute infiltrate or pleural effusion. Upper Abdomen: Stable tiny hiatal hernia. Stable tiny fluid attenuation hepatic cysts. Musculoskeletal:  No suspicious bone lesions. IMPRESSION: No definite evidence of recurrent or metastatic carcinoma within the thorax. Stable right lung nodules. Recommend continued attention on follow-up imaging. Aortic Atherosclerosis (ICD10-I70.0) and Emphysema (ICD10-J43.9). Electronically  Signed   By: Marlaine Hind M.D.   On: 03/05/2022 11:44   CUP PACEART REMOTE DEVICE CHECK  Result Date: 02/19/2022 ILR summary report received. Battery status OK. Normal device function. No new symptom, tachy, brady, or pause episodes. No new AF episodes. Monthly summary reports and ROV/PRN LA    ASSESSMENT AND PLAN:  This is a very pleasant 81 years old white female with stage IA (T1c, N0, M0) non-small cell lung cancer, adenocarcinoma with lymphovascular invasion status post left upper lobectomy with lymph node dissection in September 2019 under the care of Dr. Roxan Hockey The patient is currently on observation and she is feeling fine with no concerning complaints. She had repeat CT scan of the chest performed recently.  I personally and independently reviewed the scan and discussed the result with the patient today. Her scan showed no concerning findings for disease recurrence or metastasis. I recommended for her to continue on observation with repeat CT scan of the chest in 1 year. The patient was advised to call immediately if she has any other concerning symptoms in the interval. The patient voices understanding of current disease status and treatment options and is in agreement with the current care plan.  All questions were answered. The patient knows to call the clinic with any problems, questions or concerns. We can certainly see the patient much sooner if necessary.  Disclaimer: This note was dictated with voice recognition software. Similar sounding words can inadvertently be transcribed and may not be corrected upon review.

## 2022-03-13 NOTE — Progress Notes (Signed)
Carelink Summary Report / Loop Recorder 

## 2022-03-21 LAB — CUP PACEART REMOTE DEVICE CHECK
Date Time Interrogation Session: 20230712232346
Implantable Pulse Generator Implant Date: 20200217

## 2022-03-25 ENCOUNTER — Ambulatory Visit (INDEPENDENT_AMBULATORY_CARE_PROVIDER_SITE_OTHER): Payer: Medicare Other

## 2022-03-25 DIAGNOSIS — I480 Paroxysmal atrial fibrillation: Secondary | ICD-10-CM | POA: Diagnosis not present

## 2022-04-08 ENCOUNTER — Telehealth: Payer: Self-pay | Admitting: Internal Medicine

## 2022-04-08 NOTE — Telephone Encounter (Signed)
Called patient regarding upcoming 2024 appointment, patient is notified.

## 2022-04-25 LAB — CUP PACEART REMOTE DEVICE CHECK
Date Time Interrogation Session: 20230814232758
Implantable Pulse Generator Implant Date: 20200217

## 2022-04-29 ENCOUNTER — Ambulatory Visit (INDEPENDENT_AMBULATORY_CARE_PROVIDER_SITE_OTHER): Payer: Medicare Other

## 2022-04-29 DIAGNOSIS — I480 Paroxysmal atrial fibrillation: Secondary | ICD-10-CM

## 2022-04-29 NOTE — Progress Notes (Signed)
Carelink Summary Report / Loop Recorder 

## 2022-04-30 NOTE — Progress Notes (Signed)
Remote reviewed Stable rhythm Histograms reviewed

## 2022-05-25 NOTE — Progress Notes (Signed)
Carelink Summary Report / Loop Recorder 

## 2022-05-28 LAB — CUP PACEART REMOTE DEVICE CHECK
Date Time Interrogation Session: 20230916233211
Implantable Pulse Generator Implant Date: 20200217

## 2022-06-03 ENCOUNTER — Ambulatory Visit (INDEPENDENT_AMBULATORY_CARE_PROVIDER_SITE_OTHER): Payer: Medicare Other

## 2022-06-03 DIAGNOSIS — I480 Paroxysmal atrial fibrillation: Secondary | ICD-10-CM

## 2022-06-13 NOTE — Progress Notes (Signed)
Carelink Summary Report / Loop Recorder 

## 2022-07-08 ENCOUNTER — Ambulatory Visit (INDEPENDENT_AMBULATORY_CARE_PROVIDER_SITE_OTHER): Payer: Medicare Other

## 2022-07-08 DIAGNOSIS — I480 Paroxysmal atrial fibrillation: Secondary | ICD-10-CM

## 2022-07-08 LAB — CUP PACEART REMOTE DEVICE CHECK
Date Time Interrogation Session: 20231029230424
Implantable Pulse Generator Implant Date: 20200217

## 2022-07-29 ENCOUNTER — Telehealth: Payer: Self-pay

## 2022-07-29 NOTE — Telephone Encounter (Signed)
ILR @ [facility] 07/04/22.   Discussed explant or leave in, patient voiced she would like to leave device in. Return kit requested. Patient advised if she has further questions or concerns to call back.

## 2022-08-06 NOTE — Progress Notes (Signed)
Carelink Summary Report / Loop Recorder 

## 2022-12-30 ENCOUNTER — Other Ambulatory Visit: Payer: Self-pay | Admitting: Family Medicine

## 2022-12-30 DIAGNOSIS — Z1231 Encounter for screening mammogram for malignant neoplasm of breast: Secondary | ICD-10-CM

## 2023-01-01 ENCOUNTER — Ambulatory Visit
Admission: RE | Admit: 2023-01-01 | Discharge: 2023-01-01 | Disposition: A | Discharge status: Home / Self Care | Payer: Medicare Other | Source: Ambulatory Visit | Attending: Family Medicine | Admitting: Family Medicine

## 2023-01-01 DIAGNOSIS — Z1231 Encounter for screening mammogram for malignant neoplasm of breast: Secondary | ICD-10-CM

## 2023-02-08 DIAGNOSIS — C259 Malignant neoplasm of pancreas, unspecified: Secondary | ICD-10-CM

## 2023-02-08 HISTORY — DX: Malignant neoplasm of pancreas, unspecified: C25.9

## 2023-03-03 ENCOUNTER — Inpatient Hospital Stay: Payer: Medicare Other | Attending: Internal Medicine

## 2023-03-03 ENCOUNTER — Ambulatory Visit (HOSPITAL_COMMUNITY)
Admission: RE | Admit: 2023-03-03 | Discharge: 2023-03-03 | Disposition: A | Discharge status: Home / Self Care | Payer: Medicare Other | Source: Ambulatory Visit | Attending: Internal Medicine | Admitting: Internal Medicine

## 2023-03-03 ENCOUNTER — Other Ambulatory Visit: Payer: Self-pay

## 2023-03-03 DIAGNOSIS — Z79899 Other long term (current) drug therapy: Secondary | ICD-10-CM | POA: Insufficient documentation

## 2023-03-03 DIAGNOSIS — Z7982 Long term (current) use of aspirin: Secondary | ICD-10-CM | POA: Insufficient documentation

## 2023-03-03 DIAGNOSIS — C3490 Malignant neoplasm of unspecified part of unspecified bronchus or lung: Secondary | ICD-10-CM | POA: Insufficient documentation

## 2023-03-03 DIAGNOSIS — Z902 Acquired absence of lung [part of]: Secondary | ICD-10-CM | POA: Insufficient documentation

## 2023-03-03 DIAGNOSIS — Z85118 Personal history of other malignant neoplasm of bronchus and lung: Secondary | ICD-10-CM | POA: Insufficient documentation

## 2023-03-03 LAB — CBC WITH DIFFERENTIAL (CANCER CENTER ONLY)
Abs Immature Granulocytes: 0.02 10*3/uL (ref 0.00–0.07)
Basophils Absolute: 0 10*3/uL (ref 0.0–0.1)
Basophils Relative: 0 %
Eosinophils Absolute: 0.1 10*3/uL (ref 0.0–0.5)
Eosinophils Relative: 1 %
HCT: 41.4 % (ref 36.0–46.0)
Hemoglobin: 14.1 g/dL (ref 12.0–15.0)
Immature Granulocytes: 0 %
Lymphocytes Relative: 17 %
Lymphs Abs: 1.3 10*3/uL (ref 0.7–4.0)
MCH: 33.1 pg (ref 26.0–34.0)
MCHC: 34.1 g/dL (ref 30.0–36.0)
MCV: 97.2 fL (ref 80.0–100.0)
Monocytes Absolute: 0.4 10*3/uL (ref 0.1–1.0)
Monocytes Relative: 6 %
Neutro Abs: 5.6 10*3/uL (ref 1.7–7.7)
Neutrophils Relative %: 76 %
Platelet Count: 197 10*3/uL (ref 150–400)
RBC: 4.26 MIL/uL (ref 3.87–5.11)
RDW: 12.4 % (ref 11.5–15.5)
WBC Count: 7.5 10*3/uL (ref 4.0–10.5)
nRBC: 0 % (ref 0.0–0.2)

## 2023-03-03 LAB — CMP (CANCER CENTER ONLY)
ALT: 8 U/L (ref 0–44)
AST: 17 U/L (ref 15–41)
Albumin: 3.9 g/dL (ref 3.5–5.0)
Alkaline Phosphatase: 57 U/L (ref 38–126)
Anion gap: 5 (ref 5–15)
BUN: 16 mg/dL (ref 8–23)
CO2: 29 mmol/L (ref 22–32)
Calcium: 10.1 mg/dL (ref 8.9–10.3)
Chloride: 103 mmol/L (ref 98–111)
Creatinine: 0.87 mg/dL (ref 0.44–1.00)
GFR, Estimated: >60 mL/min (ref >60)
Glucose, Bld: 138 mg/dL — ABNORMAL HIGH (ref 70–99)
Potassium: 4.3 mmol/L (ref 3.5–5.1)
Sodium: 137 mmol/L (ref 135–145)
Total Bilirubin: 0.7 mg/dL (ref 0.3–1.2)
Total Protein: 6.2 g/dL — ABNORMAL LOW (ref 6.5–8.1)

## 2023-03-05 ENCOUNTER — Other Ambulatory Visit: Payer: Self-pay | Admitting: Internal Medicine

## 2023-03-05 ENCOUNTER — Telehealth: Payer: Self-pay | Admitting: Internal Medicine

## 2023-03-05 ENCOUNTER — Other Ambulatory Visit: Payer: Self-pay

## 2023-03-05 ENCOUNTER — Inpatient Hospital Stay (HOSPITAL_BASED_OUTPATIENT_CLINIC_OR_DEPARTMENT_OTHER): Payer: Medicare Other | Admitting: Internal Medicine

## 2023-03-05 VITALS — BP 147/73 | HR 62 | Temp 97.5°F | Resp 17 | Ht 62.0 in | Wt 146.3 lb

## 2023-03-05 DIAGNOSIS — C3490 Malignant neoplasm of unspecified part of unspecified bronchus or lung: Secondary | ICD-10-CM | POA: Diagnosis not present

## 2023-03-05 DIAGNOSIS — Z85118 Personal history of other malignant neoplasm of bronchus and lung: Secondary | ICD-10-CM | POA: Diagnosis not present

## 2023-03-05 NOTE — Progress Notes (Signed)
[facility] Telephone:([phone]) [phone]   Fax:(336) [phone]  OFFICE PROGRESS NOTE  [name], MD [address] [place] [address]  DIAGNOSIS: Stage IA (T1c, N0, M0) non-small cell lung cancer, adenocarcinoma with lymphovascular invasion    PRIOR THERAPY: Status post left upper lobectomy with lymph node dissection in September 2019 under the care of Dr. Dorris Fetch   CURRENT THERAPY: Observation.  INTERVAL HISTORY: [name] 82 y.o. female returns to the clinic today for annual follow-up visit.  The patient is feeling fine today with no concerning complaints.  She denied having any current chest pain, shortness of breath, cough or hemoptysis.  She has no nausea, vomiting, diarrhea or constipation.  She has no headache or visual changes.  She denied having any recent weight loss or night sweats.  She is here today for evaluation with repeat CT scan of the chest for restaging of her disease.  MEDICAL HISTORY: Past Medical History:  Diagnosis Date   Anxiety    Blood glucose elevated 05/27/2012   pt. 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   Overview:  Dr Elnoria Howard  Last Assessment & Plan:  Relevant Hx: Course: Daily Update: Today's Plan:    GERD (gastroesophageal reflux disease)    Hemorrhoid 12/06/2015   History of hiatal hernia    HLD (hyperlipidemia) 12/03/2011   Hypertension    Lung cancer ([facility]) dx'd 04/2018   Osteopenia 05/30/2015   Overview:  Bone density 05/2015 started fosamax    Primary localized osteoarthritis of left knee    Primary localized osteoarthritis of right knee 12/06/2015    ALLERGIES:  is allergic to [employer] tartrate], sulfa antibiotics, codeine, hydrocodone, and meperidine.  MEDICATIONS:  Current Outpatient Medications  Medication Sig Dispense Refill   aspirin EC 81 MG tablet Take 1 tablet (81 mg total) by mouth daily. Swallow whole. 90 tablet 3   Calcium Carb-Cholecalciferol (CALCIUM-VITAMIN D3) 600-200 MG-UNIT TABS Take by mouth daily.     cetirizine (ZYRTEC) 10 MG tablet Take 10 mg by mouth as needed for allergies.     diphenhydramine-acetaminophen (TYLENOL PM) 25-500 MG TABS tablet Take 2 tablets by mouth at bedtime as needed (for sleep).     escitalopram (LEXAPRO) 20 MG tablet Take by mouth daily.     metoprolol succinate (TOPROL-XL) 50 MG 24 hr tablet Take 50 mg by mouth daily. Take with or immediately following a meal.     Multiple Vitamins-Minerals (MULTIVITAMIN PO) Take 1 tablet by mouth daily.     polyethylene glycol (MIRALAX / GLYCOLAX) packet Take 8.5 g by mouth every other day.     simvastatin (ZOCOR) 20 MG tablet Take 20 mg by mouth daily.     No current facility-administered medications for this visit.    SURGICAL HISTORY:  Past Surgical History:  Procedure Laterality Date   ABDOMINAL HYSTERECTOMY     CATARACT EXTRACTION, BILATERAL     COLONOSCOPY     EYE SURGERY Bilateral    Cataract   implantable loop recorder placement  10/26/2018   MDT LINQ Reveal XT ILR implanted for evaluation of palpitations and atrial  fibrillation in office by Dr Johney Frame, Device SN [id number])    KNEE SURGERY  05/01/2016   left uncompartmental    PARTIAL KNEE ARTHROPLASTY Right 12/18/2015   Procedure: UNICOMPARTMENTAL RIGHT KNEE;  Surgeon: Salvatore Marvel, MD;  [facility] OR;  Service: Orthopedics;  Laterality: Right;   PARTIAL KNEE ARTHROPLASTY Left 05/01/2016   Procedure: UNICOMPARTMENTAL KNEE;  Surgeon: Salvatore Marvel, MD;  Location: [facility] OR;  Service: Orthopedics;  Laterality: Left;   TOTAL ABDOMINAL HYSTERECTOMY W/ BILATERAL SALPINGOOPHORECTOMY     VIDEO ASSISTED THORACOSCOPY (VATS)/  LOBECTOMY Left 05/13/2018   Procedure: VIDEO ASSISTED THORACOSCOPY (VATS)/LEFT UPPER LOBECTOMY;  Surgeon: Loreli Slot, MD;  Location: [facility] OR;  Service: Thoracic;  Laterality: Left;    REVIEW OF SYSTEMS:  A comprehensive review of systems was negative.   PHYSICAL EXAMINATION: General appearance: alert, cooperative, and no distress Head: Normocephalic, without obvious abnormality, atraumatic Neck: no adenopathy, no JVD, supple, symmetrical, trachea midline, and thyroid not enlarged, symmetric, no tenderness/mass/nodules Lymph nodes: Cervical, supraclavicular, and axillary nodes normal. Resp: clear to auscultation bilaterally Back: symmetric, no curvature. ROM normal. No CVA tenderness. Cardio: regular rate and rhythm, S1, S2 normal, no murmur, click, rub or gallop GI: soft, non-tender; bowel sounds normal; no masses,  no organomegaly Extremities: extremities normal, atraumatic, no cyanosis or edema  ECOG PERFORMANCE STATUS: 1 - Symptomatic but completely ambulatory  Blood pressure (!) 147/73, pulse 62, temperature (!) 97.5 F (36.4 C), temperature source Temporal, resp. rate 17, height 5\' 2"  (1.575 m), weight 146 lb 4.8 oz (66.4 kg), SpO2 100 %.  LABORATORY DATA: Lab Results  Component Value Date   WBC 7.5 03/03/2023   HGB 14.1 03/03/2023   HCT 41.4 03/03/2023   MCV 97.2 03/03/2023   PLT 197 03/03/2023      Chemistry      Component Value Date/Time   NA 137 03/03/2023 1048   K 4.3 03/03/2023 1048   CL 103 03/03/2023 1048   CO2 29 03/03/2023 1048   BUN 16 03/03/2023 1048   CREATININE 0.87 03/03/2023 1048      Component Value Date/Time   CALCIUM 10.1 03/03/2023 1048   ALKPHOS 57 03/03/2023 1048   AST 17 03/03/2023 1048   ALT 8 03/03/2023 1048   BILITOT 0.7 03/03/2023 1048       RADIOGRAPHIC STUDIES: No results found.   ASSESSMENT AND PLAN:  This is a very pleasant 82 years old white female with stage IA (T1c, N0, M0) non-small cell lung cancer, adenocarcinoma  with lymphovascular invasion status post left upper lobectomy with lymph node dissection in September 2019 under the care of Dr. Dorris Fetch The patient is currently on observation and she is feeling fine with no concerning complaints. She had repeat CT scan of the chest performed recently.  The final report is still pending but I personally and independently reviewed the scan images in comparison to the previous imaging studies and I do not see any concerning findings for disease recurrence or metastasis except for slightly more solid right pleural-based density likely a consolidation.  I will wait for the final report for confirmation. I recommended for the patient to continue on observation with repeat CT scan of the chest in 1 year. She was advised to call immediately if she has any concerning symptoms in the interval. The patient voices understanding of current disease status and treatment options and is in agreement with the current care plan.  All questions were answered. The patient knows to call the clinic with any problems,  questions or concerns. We can certainly see the patient much sooner if necessary.  Disclaimer: This note was dictated with voice recognition software. Similar sounding words can inadvertently be transcribed and may not be corrected upon review.

## 2023-03-05 NOTE — Telephone Encounter (Signed)
Unable to leave a message on patient's phone, not available

## 2023-03-12 ENCOUNTER — Other Ambulatory Visit: Payer: Medicare Other

## 2023-03-28 ENCOUNTER — Ambulatory Visit (HOSPITAL_COMMUNITY)
Admission: RE | Admit: 2023-03-28 | Discharge: 2023-03-28 | Disposition: A | Discharge status: Home / Self Care | Payer: Medicare Other | Source: Ambulatory Visit | Attending: Internal Medicine | Admitting: Internal Medicine

## 2023-03-28 DIAGNOSIS — K8689 Other specified diseases of pancreas: Secondary | ICD-10-CM | POA: Diagnosis not present

## 2023-03-28 DIAGNOSIS — R9389 Abnormal findings on diagnostic imaging of other specified body structures: Secondary | ICD-10-CM | POA: Diagnosis not present

## 2023-03-28 DIAGNOSIS — C3490 Malignant neoplasm of unspecified part of unspecified bronchus or lung: Secondary | ICD-10-CM | POA: Insufficient documentation

## 2023-03-28 DIAGNOSIS — R918 Other nonspecific abnormal finding of lung field: Secondary | ICD-10-CM | POA: Insufficient documentation

## 2023-03-28 DIAGNOSIS — R911 Solitary pulmonary nodule: Secondary | ICD-10-CM | POA: Diagnosis not present

## 2023-03-28 DIAGNOSIS — C3492 Malignant neoplasm of unspecified part of left bronchus or lung: Secondary | ICD-10-CM | POA: Insufficient documentation

## 2023-03-28 LAB — GLUCOSE, CAPILLARY: Glucose-Capillary: 122 mg/dL — ABNORMAL HIGH (ref 70–99)

## 2023-03-28 MED ORDER — FLUDEOXYGLUCOSE F - 18 (FDG) INJECTION
7.6000 | Freq: Once | INTRAVENOUS | Status: AC
  Administered 2023-03-28: 6.975 via INTRAVENOUS

## 2023-04-01 ENCOUNTER — Inpatient Hospital Stay: Payer: Medicare Other | Attending: Internal Medicine | Admitting: Internal Medicine

## 2023-04-01 ENCOUNTER — Other Ambulatory Visit: Payer: Self-pay

## 2023-04-01 ENCOUNTER — Other Ambulatory Visit: Payer: Self-pay | Admitting: Internal Medicine

## 2023-04-01 VITALS — BP 124/66 | HR 65 | Temp 97.8°F | Resp 17 | Ht 62.0 in | Wt 140.9 lb

## 2023-04-01 DIAGNOSIS — Z85118 Personal history of other malignant neoplasm of bronchus and lung: Secondary | ICD-10-CM | POA: Diagnosis present

## 2023-04-01 DIAGNOSIS — C3492 Malignant neoplasm of unspecified part of left bronchus or lung: Secondary | ICD-10-CM | POA: Diagnosis not present

## 2023-04-01 DIAGNOSIS — Z902 Acquired absence of lung [part of]: Secondary | ICD-10-CM | POA: Insufficient documentation

## 2023-04-01 NOTE — Progress Notes (Signed)
[facility] [phone]   [phone]  OFFICE PROGRESS NOTE  [name], MD [address] [place] [address]  DIAGNOSIS:  1) suspicious right middle lobe hypermetabolic nodule concerning for metachronous lung cancer. 2) Stage IA (T1c, N0, M0) non-small cell lung cancer, adenocarcinoma with lymphovascular invasion    PRIOR THERAPY: Status post left upper lobectomy with lymph node dissection in September 2019 under the care of Dr. Dorris Fetch   CURRENT THERAPY: Observation.  INTERVAL HISTORY: [name] 82 y.o. female returns to the clinic today for follow-up visit accompanied by her son and daughter-in-law.  The patient is feeling fine today with no concerning complaints.  She got sick after her PET scan over the weekend with nausea but now improved.  She denied having any current chest pain, shortness of breath, cough or hemoptysis.  She has no nausea, vomiting, diarrhea or constipation.  She has no headache or visual changes.  She was found on previous CT scan of the chest to have enlarging right middle lobe lung nodule.  The patient had a PET scan performed recently and she is here for evaluation and discussion of her PET scan and treatment options.  MEDICAL HISTORY: Past Medical History:  Diagnosis Date   Anxiety    Blood glucose elevated 05/27/2012   pt. 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  MEDICATIONS:  Current Outpatient Medications  Medication Sig Dispense Refill   aspirin EC 81 MG tablet Take 1 tablet (81 mg total) by mouth daily. Swallow whole. 90 tablet 3   Calcium Carb-Cholecalciferol (CALCIUM-VITAMIN D3) 600-200 MG-UNIT TABS Take by mouth daily.     cetirizine (ZYRTEC) 10 MG tablet Take 10 mg by mouth as needed for allergies.     diphenhydramine-acetaminophen (TYLENOL PM) 25-500 MG TABS tablet Take 2 tablets by mouth at bedtime as needed (for sleep).     escitalopram (LEXAPRO) 20 MG tablet Take by mouth daily.     metoprolol succinate (TOPROL-XL) 50 MG 24 hr tablet Take 50 mg by mouth daily. Take with or immediately following a meal.     Multiple Vitamins-Minerals (MULTIVITAMIN PO) Take 1 tablet by mouth daily.     polyethylene glycol (MIRALAX / GLYCOLAX) packet Take 8.5 g by mouth every other day.     simvastatin (ZOCOR) 20 MG tablet Take 20 mg by mouth daily.     No current facility-administered medications for this visit.    SURGICAL HISTORY:  Past Surgical History:  Procedure Laterality Date  ABDOMINAL HYSTERECTOMY     CATARACT EXTRACTION, BILATERAL     COLONOSCOPY     EYE SURGERY Bilateral    Cataract   implantable loop recorder placement  10/26/2018   MDT LINQ Reveal XT ILR implanted for evaluation of palpitations and atrial fibrillation in office by Dr Johney Frame, Device SN [id number])    KNEE SURGERY  05/01/2016   left uncompartmental    PARTIAL KNEE ARTHROPLASTY Right 12/18/2015   Procedure: UNICOMPARTMENTAL RIGHT KNEE;  Surgeon: Salvatore Marvel, MD;  Location: [facility] OR;  Service:  Orthopedics;  Laterality: Right;   PARTIAL KNEE ARTHROPLASTY Left 05/01/2016   Procedure: UNICOMPARTMENTAL KNEE;  Surgeon: Salvatore Marvel, MD;  Location: [facility] OR;  Service: Orthopedics;  Laterality: Left;   TOTAL ABDOMINAL HYSTERECTOMY W/ BILATERAL SALPINGOOPHORECTOMY     VIDEO ASSISTED THORACOSCOPY (VATS)/ LOBECTOMY Left 05/13/2018   Procedure: VIDEO ASSISTED THORACOSCOPY (VATS)/LEFT UPPER LOBECTOMY;  Surgeon: Loreli Slot, MD;  Location: [facility] OR;  Service: Thoracic;  Laterality: Left;    REVIEW OF SYSTEMS:  Constitutional: positive for fatigue Eyes: negative Ears, nose, mouth, throat, and face: negative Respiratory: negative Cardiovascular: negative Gastrointestinal: negative Genitourinary:negative Integument/breast: negative Hematologic/lymphatic: negative Musculoskeletal:negative Neurological: negative Behavioral/Psych: negative Endocrine: negative Allergic/Immunologic: negative   PHYSICAL EXAMINATION: General appearance: alert, cooperative, and no distress Head: Normocephalic, without obvious abnormality, atraumatic Neck: no adenopathy, no JVD, supple, symmetrical, trachea midline, and thyroid not enlarged, symmetric, no tenderness/mass/nodules Lymph nodes: Cervical, supraclavicular, and axillary nodes normal. Resp: clear to auscultation bilaterally Back: symmetric, no curvature. ROM normal. No CVA tenderness. Cardio: regular rate and rhythm, S1, S2 normal, no murmur, click, rub or gallop GI: soft, non-tender; bowel sounds normal; no masses,  no organomegaly Extremities: extremities normal, atraumatic, no cyanosis or edema Neurologic: Alert and oriented X 3, normal strength and tone. Normal symmetric reflexes. Normal coordination and gait  ECOG PERFORMANCE STATUS: 1 - Symptomatic but completely ambulatory  Blood pressure 124/66, pulse 65, temperature 97.8 F (36.6 C), temperature source Oral, resp. rate 17, height 5\' 2"  (1.575 m), weight 140 lb 14.4 oz (63.9 kg), SpO2  98%.  LABORATORY DATA: Lab Results  Component Value Date   WBC 7.5 03/03/2023   HGB 14.1 03/03/2023   HCT 41.4 03/03/2023   MCV 97.2 03/03/2023   PLT 197 03/03/2023      Chemistry      Component Value Date/Time   NA 137 03/03/2023 1048   K 4.3 03/03/2023 1048   CL 103 03/03/2023 1048   CO2 29 03/03/2023 1048   BUN 16 03/03/2023 1048   CREATININE 0.87 03/03/2023 1048      Component Value Date/Time   CALCIUM 10.1 03/03/2023 1048   ALKPHOS 57 03/03/2023 1048   AST 17 03/03/2023 1048   ALT 8 03/03/2023 1048   BILITOT 0.7 03/03/2023 1048       RADIOGRAPHIC STUDIES: NM PET Image Restage (PS) Skull Base to Thigh (F-18 FDG)  Result Date: 04/01/2023 CLINICAL DATA:  Subsequent treatment strategy for non-small cell lung cancer. Enlarging right middle lobe pulmonary nodule on recent chest CT. History of left lung cancer with surgical resection. EXAM: NUCLEAR MEDICINE PET SKULL BASE TO THIGH TECHNIQUE: 6.975 mCi F-18 FDG was injected intravenously. Full-ring PET imaging was performed from the skull base to thigh after the radiotracer. CT data was obtained and used for attenuation correction and anatomic localization. Fasting blood glucose: 122 mg/dl COMPARISON:  Multiple previous imaging studies. The most recent chest CT is 03/03/2023. The prior PET CT is from 2019. FINDINGS: Mediastinal blood pool  activity: SUV max 2.43 Liver activity: SUV max NA NECK: No hypermetabolic lymph nodes in the neck. Incidental CT findings: Left carotid artery calcifications. CHEST: The 12 mm medial right middle lobe pulmonary nodule is hypermetabolic with SUV max of 5.75. Findings consistent with primary neoplasm. 7 mm right lower lobe nodule does not demonstrate any definite hypermetabolism but is still quite suspicious and will need close surveillance. 7 mm right upper lobe nodule does not demonstrate any hypermetabolism but will also require surveillance. Stable surgical changes involving the left lung. No  hypermetabolic pulmonary lesions in the left lung. No enlarged or hypermetabolic mediastinal hilar lymph nodes to suggest metastatic adenopathy. No supraclavicular axillary adenopathy. No hypermetabolic breast lesions. Incidental CT findings: Stable emphysematous changes and pulmonary scarring. Stable age related vascular disease. ABDOMEN/PELVIS: Abnormal appearance of the pancreatic head. There is moderate soft tissue nodularity not present on the prior PET-CT. Mild hypermetabolism with SUV max of 3.73. No common bile duct dilatation. Suspect pancreatic ductal dilatation. Recommend MRI abdomen without with contrast for further evaluation. Incidental CT findings: Stable age related atherosclerotic calcifications involving the aorta but no aneurysm. Stable colonic diverticulosis. Simple right renal cyst not requiring any further imaging evaluation or follow-up. SKELETON: No findings osseous metastatic disease. Incidental CT findings: None. IMPRESSION: 1. 12 mm medial right middle lobe pulmonary nodule is hypermetabolic and consistent with primary neoplasm. 2. 7 mm right lower lobe nodule and 7 mm right upper lobe nodule do not demonstrate any hypermetabolism but will need close surveillance. 3. No mediastinal or hilar lymphadenopathy. 4. No findings for osseous metastatic disease. 5. Abnormal appearance of the pancreatic head and mild hypermetabolism worrisome for neoplasm. Recommend MRI abdomen without and with contrast for further evaluation. Aortic Atherosclerosis (ICD10-I70.0). Electronically Signed   By: Rudie Meyer M.D.   On: 04/01/2023 10:03   CT Chest Wo Contrast  Result Date: 03/05/2023 CLINICAL DATA:  Restaging non-small cell lung cancer. * Tracking Code: BO * EXAM: CT CHEST WITHOUT CONTRAST TECHNIQUE: Multidetector CT imaging of the chest was performed following the standard protocol without IV contrast. RADIATION DOSE REDUCTION: This exam was performed according to the departmental dose-optimization  program which includes automated exposure control, adjustment of the mA and/or kV according to patient size and/or use of iterative reconstruction technique. COMPARISON:  Chest CT 03/04/2022 and 02/13/2021. FINDINGS: Cardiovascular: Atherosclerosis of the aorta, great vessels and coronary arteries again noted. No acute vascular findings are identified on noncontrast imaging. The heart size is normal. There is no pericardial effusion. Mediastinum/Nodes: There are no enlarged mediastinal, hilar or axillary lymph nodes.Small mediastinal lymph nodes are unchanged. Hilar assessment is limited by the lack of intravenous contrast, although the hilar contours appear unchanged. Stable small hiatal hernia. The thyroid gland appears unremarkable. Lungs/Pleura: No pleural effusion or pneumothorax. Moderate centrilobular and paraseptal emphysema. There are chronic postsurgical changes from previous left upper lobe resection. There are no suspicious nodules within the left lung. However, there are several enlarging and concerning right lung nodules. The largest of these is located posteriorly in the right middle lobe along the major fissure, measuring 1.4 x 1.3 cm on image 97/8. This nodule is solid and macrolobulated, highly suspicious for primary bronchogenic carcinoma. There are additional smaller solid nodules measuring 0.7 x 0.6 cm in the right upper lobe on image 37/8 and 0.9 x 0.7 cm posteriorly in the right lower lobe on image 81/8. Right apical scarring is unchanged. Upper abdomen: The visualized upper abdomen appears stable, without suspicious findings. There are stable low-density  liver lesions consistent with cysts. There is a stable cyst in the interpolar region of the right kidney for which no follow-up imaging is recommended. Musculoskeletal/Chest wall: There is no chest wall mass or suspicious osseous finding. IMPRESSION: 1. Enlarging and concerning right lung nodules, the largest in the right middle lobe  measuring up to 1.4 cm in diameter, highly suspicious for primary bronchogenic carcinoma. The right middle lobe nodule is large enough to evaluate by PET-CT which is recommended. 2. No evidence of thoracic adenopathy or pleural effusion. No suspicious left lung nodule status post upper lobe resection. 3. Aortic Atherosclerosis (ICD10-I70.0) and Emphysema (ICD10-J43.9). Electronically Signed   By: Carey Bullocks M.D.   On: 03/05/2023 11:35     ASSESSMENT AND PLAN:  This is a very pleasant 82 years old white female with stage IA (T1c, N0, M0) non-small cell lung cancer, adenocarcinoma with lymphovascular invasion status post left upper lobectomy with lymph node dissection in September 2019 under the care of Dr. Dorris Fetch The patient has been on observation but on a recent CT scan of the chest she was found to have enlarging right middle lobe lung nodule.  I ordered a PET scan which was performed recently and it showed 1.2 cm medial right middle lobe pulmonary nodule that is hypermetabolic and consistent with primary neoplasm.  There was 0.7 cm right lower lobe nodule and 0.7 right upper lobe pulmonary nodule that did not demonstrate any hypermetabolism but still require close surveillance.  There was no mediastinal or hilar lymphadenopathy and no finding of distant metastatic disease or osseous metastasis. I had a lengthy discussion with the patient and her family about these findings and treatment options. I recommended for her to see Dr. Dorris Fetch first to discuss surgical resection but if she is not a candidate, she would be treated with SBRT to the right middle lobe lung nodule followed by close monitoring of the other small nodule in the right upper and right lower lobes. She may also need repeat bronchoscopy for confirmation of the tissue diagnosis. The patient and her family are in agreement with the current plan. I will see her back for follow-up visit in 1 months for evaluation and discussion of  her treatment options after her visit with Dr. Dorris Fetch. The patient was advised to call immediately if she has any other concerning symptoms in the interval.  The patient voices understanding of current disease status and treatment options and is in agreement with the current care plan.  All questions were answered. The patient knows to call the clinic with any problems, questions or concerns. We can certainly see the patient much sooner if necessary.  Disclaimer: This note was dictated with voice recognition software. Similar sounding words can inadvertently be transcribed and may not be corrected upon review.

## 2023-04-14 ENCOUNTER — Encounter: Payer: Self-pay | Admitting: Thoracic Surgery (Cardiothoracic Vascular Surgery)

## 2023-04-14 ENCOUNTER — Institutional Professional Consult (permissible substitution) (INDEPENDENT_AMBULATORY_CARE_PROVIDER_SITE_OTHER): Payer: Medicare Other | Admitting: Thoracic Surgery (Cardiothoracic Vascular Surgery)

## 2023-04-14 VITALS — BP 127/83 | HR 67 | Resp 20 | Ht 62.0 in | Wt 140.0 lb

## 2023-04-14 DIAGNOSIS — R911 Solitary pulmonary nodule: Secondary | ICD-10-CM

## 2023-04-14 NOTE — Progress Notes (Signed)
PCP is [name], MD Referring Provider is Si Gaul, MD  Chief Complaint  Patient presents with   Lung Lesion    PET Scan 03/28/23/ Chest CT 03/03/23/ HX of Left upper lobectomy 05/2018    HPI: Mrs. Carollo sent for consultation regarding a right middle lobe lung nodule.  [name] is an 82 year old woman with a history of lung cancer.  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  Her past medical history is also significant for hypertension, hyperlipidemia, hiatal hernia, reflux, cardiac conduction disorder, osteopenia, and osteoarthritis.  She has been followed since her lobectomy in 2019.  There is been a small nodule in the right middle lobe.  On her most recent CT the nodule had increased in size to 1.3 x 1.4 cm   Past Medical History:  Diagnosis Date   Anxiety    Blood glucose elevated 05/27/2012   pt. 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   Overview:  Dr Elnoria Howard  Last Assessment & Plan:  Relevant Hx: Course: Daily Update: Today's Plan:    GERD (gastroesophageal reflux disease)    Hemorrhoid 12/06/2015   History of hiatal hernia    HLD (hyperlipidemia) 12/03/2011   Hypertension    Lung cancer (HCC) dx'd 04/2018   Osteopenia 05/30/2015   Overview:  Bone density 05/2015 started fosamax    Primary localized osteoarthritis of left knee    Primary localized osteoarthritis of right knee 12/06/2015    Past Surgical History:  Procedure Laterality Date   ABDOMINAL HYSTERECTOMY     CATARACT EXTRACTION, BILATERAL     COLONOSCOPY     EYE SURGERY Bilateral    Cataract   implantable loop recorder placement  10/26/2018   MDT LINQ  Reveal XT ILR implanted for evaluation of palpitations and atrial fibrillation in office by Dr Johney Frame, Device SN [id number])    KNEE SURGERY  05/01/2016   left uncompartmental    PARTIAL KNEE ARTHROPLASTY Right 12/18/2015   Procedure: UNICOMPARTMENTAL RIGHT KNEE;  Surgeon: Salvatore Marvel, MD;  Location: [facility] OR;  Service: Orthopedics;  Laterality: Right;   PARTIAL KNEE ARTHROPLASTY Left 05/01/2016   Procedure: UNICOMPARTMENTAL KNEE;  Surgeon: Salvatore Marvel, MD;  Location: [facility] OR;  Service: Orthopedics;  Laterality: Left;   TOTAL ABDOMINAL HYSTERECTOMY W/ BILATERAL SALPINGOOPHORECTOMY     VIDEO ASSISTED THORACOSCOPY (VATS)/ LOBECTOMY Left 05/13/2018   Procedure: VIDEO ASSISTED THORACOSCOPY (VATS)/LEFT UPPER LOBECTOMY;  Surgeon: Loreli Slot, MD;  Location: [facility] OR;  Service: Thoracic;  Laterality: Left;    Family History  Problem Relation Age of Onset   Congestive Heart Failure Mother    Breast cancer Sister    Breast cancer Niece     Social History Social History   Tobacco Use   Smoking status: Former    Current packs/day: 0.00    Types: Cigarettes    Start date: 02/09/1969    Quit date: 02/10/2019    Years since quitting: 4.1   Smokeless tobacco: Never   Tobacco comments:    only one cigarette occasionally  Vaping Use   Vaping status: Never Used  Substance Use Topics  Alcohol use: Yes    Comment: social rare   Drug use: No    Current Outpatient Medications  Medication Sig Dispense Refill   aspirin EC 81 MG tablet Take 1 tablet (81 mg total) by mouth daily. Swallow whole. 90 tablet 3   Calcium Carb-Cholecalciferol (CALCIUM-VITAMIN D3) 600-200 MG-UNIT TABS Take by mouth daily.     cetirizine (ZYRTEC) 10 MG tablet Take 10 mg by mouth as needed for allergies.     diphenhydramine-acetaminophen (TYLENOL PM) 25-500 MG TABS tablet Take 2 tablets by mouth at bedtime as needed (for sleep).     escitalopram (LEXAPRO) 20 MG tablet Take by mouth daily.     metoprolol succinate  (TOPROL-XL) 50 MG 24 hr tablet Take 50 mg by mouth daily. Take with or immediately following a meal.     Multiple Vitamins-Minerals (MULTIVITAMIN PO) Take 1 tablet by mouth daily.     polyethylene glycol (MIRALAX / GLYCOLAX) packet Take 8.5 g by mouth every other day.     simvastatin (ZOCOR) 20 MG tablet Take 20 mg by mouth daily.     No current facility-administered medications for this visit.    Allergies  Allergen Reactions   Ambien [Zolpidem Tartrate] Other (See Comments)    ARRYTHMIA   Sulfa Antibiotics Other (See Comments)    UNSPECIFIED, childhood reaction   Codeine Hives and Rash   Hydrocodone Other (See Comments)    FATIGUE   Meperidine Nausea And Vomiting    Review of Systems  Constitutional:  Negative for unexpected weight change.  HENT:  Negative for trouble swallowing and voice change.   Respiratory:  Negative for cough and shortness of breath.   Cardiovascular:  Negative for chest pain.  Musculoskeletal:  Positive for arthralgias and joint swelling.  Neurological:  Negative for seizures and headaches.  Hematological:  Bruises/bleeds easily.    BP 127/83   Pulse 67   Resp 20   Ht 5\' 2"  (1.575 m)   Wt 140 lb (63.5 kg)   SpO2 97% Comment: RA  BMI 25.61 kg/m  Physical Exam Vitals reviewed.  Constitutional:      General: She is not in acute distress.    Appearance: Normal appearance.  HENT:     Head: Normocephalic and atraumatic.  Eyes:     General: No scleral icterus.    Extraocular Movements: Extraocular movements intact.  Neck:     Vascular: No carotid bruit.  Cardiovascular:     Rate and Rhythm: Normal rate and regular rhythm.     Heart sounds: Murmur (2/6 systolic) heard.  Pulmonary:     Effort: Pulmonary effort is normal.     Breath sounds: No wheezing or rales.     Comments: Diminished breath sounds left base Abdominal:     General: There is no distension.     Palpations: Abdomen is soft.  Musculoskeletal:     Cervical back: Neck supple.   Lymphadenopathy:     Cervical: No cervical adenopathy.  Skin:    General: Skin is warm and dry.  Neurological:     General: No focal deficit present.     Mental Status: She is alert and oriented to person, place, and time.     Cranial Nerves: No cranial nerve deficit.     Motor: No weakness.      Diagnostic Tests: CT CHEST WITHOUT CONTRAST   TECHNIQUE: Multidetector CT imaging of the chest was performed following the standard protocol without IV contrast.   RADIATION DOSE REDUCTION: This exam  was performed according to the departmental dose-optimization program which includes automated exposure control, adjustment of the mA and/or kV according to patient size and/or use of iterative reconstruction technique.   COMPARISON:  Chest CT 03/04/2022 and 02/13/2021.   FINDINGS: Cardiovascular: Atherosclerosis of the aorta, great vessels and coronary arteries again noted. No acute vascular findings are identified on noncontrast imaging. The heart size is normal. There is no pericardial effusion.   Mediastinum/Nodes: There are no enlarged mediastinal, hilar or axillary lymph nodes.Small mediastinal lymph nodes are unchanged. Hilar assessment is limited by the lack of intravenous contrast, although the hilar contours appear unchanged. Stable small hiatal hernia. The thyroid gland appears unremarkable.   Lungs/Pleura: No pleural effusion or pneumothorax. Moderate centrilobular and paraseptal emphysema. There are chronic postsurgical changes from previous left upper lobe resection. There are no suspicious nodules within the left lung. However, there are several enlarging and concerning right lung nodules. The largest of these is located posteriorly in the right middle lobe along the major fissure, measuring 1.4 x 1.3 cm on image 97/8. This nodule is solid and macrolobulated, highly suspicious for primary bronchogenic carcinoma. There are additional smaller solid nodules measuring  0.7 x 0.6 cm in the right upper lobe on image 37/8 and 0.9 x 0.7 cm posteriorly in the right lower lobe on image 81/8. Right apical scarring is unchanged.   Upper abdomen: The visualized upper abdomen appears stable, without suspicious findings. There are stable low-density liver lesions consistent with cysts. There is a stable cyst in the interpolar region of the right kidney for which no follow-up imaging is recommended.   Musculoskeletal/Chest wall: There is no chest wall mass or suspicious osseous finding.   IMPRESSION: 1. Enlarging and concerning right lung nodules, the largest in the right middle lobe measuring up to 1.4 cm in diameter, highly suspicious for primary bronchogenic carcinoma. The right middle lobe nodule is large enough to evaluate by PET-CT which is recommended. 2. No evidence of thoracic adenopathy or pleural effusion. No suspicious left lung nodule status post upper lobe resection. 3. Aortic Atherosclerosis (ICD10-I70.0) and Emphysema (ICD10-J43.9).     Electronically Signed   By: Carey Bullocks M.D.   On: 03/05/2023 11:35 NUCLEAR MEDICINE PET SKULL BASE TO THIGH   TECHNIQUE: 6.975 mCi F-18 FDG was injected intravenously. Full-ring PET imaging was performed from the skull base to thigh after the radiotracer. CT data was obtained and used for attenuation correction and anatomic localization.   Fasting blood glucose: 122 mg/dl   COMPARISON:  Multiple previous imaging studies. The most recent chest CT is 03/03/2023. The prior PET CT is from 2019.   FINDINGS: Mediastinal blood pool activity: SUV max 2.43   Liver activity: SUV max NA   NECK: No hypermetabolic lymph nodes in the neck.   Incidental CT findings: Left carotid artery calcifications.   CHEST: The 12 mm medial right middle lobe pulmonary nodule is hypermetabolic with SUV max of 5.75. Findings consistent with primary neoplasm. 7 mm right lower lobe nodule does not demonstrate any  definite hypermetabolism but is still quite suspicious and will need close surveillance. 7 mm right upper lobe nodule does not demonstrate any hypermetabolism but will also require surveillance.   Stable surgical changes involving the left lung. No hypermetabolic pulmonary lesions in the left lung. No enlarged or hypermetabolic mediastinal hilar lymph nodes to suggest metastatic adenopathy. No supraclavicular axillary adenopathy.   No hypermetabolic breast lesions.   Incidental CT findings: Stable emphysematous changes and pulmonary scarring. Stable  age related vascular disease.   ABDOMEN/PELVIS: Abnormal appearance of the pancreatic head. There is moderate soft tissue nodularity not present on the prior PET-CT. Mild hypermetabolism with SUV max of 3.73. No common bile duct dilatation. Suspect pancreatic ductal dilatation. Recommend MRI abdomen without with contrast for further evaluation.   Incidental CT findings: Stable age related atherosclerotic calcifications involving the aorta but no aneurysm. Stable colonic diverticulosis. Simple right renal cyst not requiring any further imaging evaluation or follow-up.   SKELETON: No findings osseous metastatic disease.   Incidental CT findings: None.   IMPRESSION: 1. 12 mm medial right middle lobe pulmonary nodule is hypermetabolic and consistent with primary neoplasm. 2. 7 mm right lower lobe nodule and 7 mm right upper lobe nodule do not demonstrate any hypermetabolism but will need close surveillance. 3. No mediastinal or hilar lymphadenopathy. 4. No findings for osseous metastatic disease. 5. Abnormal appearance of the pancreatic head and mild hypermetabolism worrisome for neoplasm. Recommend MRI abdomen without and with contrast for further evaluation.   Aortic Atherosclerosis (ICD10-I70.0).     Electronically Signed   By: Rudie Meyer M.D.   On: 04/01/2023 10:03 I personally reviewed the CT images and PET/CT images.   There is a 1.3 x 1.4 cm central right middle lobe nodule that is markedly hypermetabolic.  7 mm right upper lobe and right lower lobe nodules.  No adenopathy or evidence of distant metastases.  Per radiology there is some abnormality of the head of the pancreas.  Impression: [name] is an 82 year old woman with a history of lung cancer.  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  Her past medical history is also significant for hypertension, hyperlipidemia, hiatal hernia, reflux, cardiac conduction disorder, osteopenia, and osteoarthritis.  Right middle lobe lung nodule-1.3 x 1.4 cm.  Has been growing significantly over the past couple of scans.  Hypermetabolic with an SUV of 5.75.  Almost certainly a primary bronchogenic carcinoma.  Is technically resectable but would require a middle lobectomy.  Right upper and lower lobe lung nodules-both about 7 mm in diameter.  I am very concerned that these also represent cancers, particularly the lower lobe nodule which was present on the CT a year ago but is increased in size.  Not large enough for PET resolution..  I discussed the potential for surgical resection of the right middle lobe nodule and possible wedge resection of the lower lobe nodule at the time of surgery.  The upper lobe nodule is central and an area of heavy emphysema not suitable for wedge resection.  We discussed the pros and cons of surgical approach.  She understands that surgery in general offers a better chance of a cure, but that may be significantly limited if the other nodules are cancerous as well.  She expressed significant concern about having another operation.  She would need pulmonary function testing prior to surgical resection.  One of her primary concerns is that she would require oxygen.  Hard to say without PFTs but I suspect she would not need oxygen permanently.  Given that she has multiple nodules I think she needs an MRI of the  brain to rule out brain metastases.  I suspect that will be negative but I think it needs to be done certainly before any consideration for surgery.  Radiology noticed some issues with the head of her pancreas and could potentially be a malignancy which would also impact how we would address her lung cancer.  Will discuss with Dr. Arbutus Ped whether referrals been made regarding that issue.  Plan: Pulmonary function testing with and without bronchodilators Referral to radiation oncology for discussion of possible stereotactic radiation MR brain to rule out metastases given multiple lung nodules. Will discuss pancreatic findings with Dr. Arbutus Ped She will return in about 3 weeks after she seen radiation oncology to make a final decision.  Loreli Slot, MD [facility] [phone]

## 2023-04-15 ENCOUNTER — Other Ambulatory Visit: Payer: Self-pay | Admitting: Thoracic Surgery (Cardiothoracic Vascular Surgery)

## 2023-04-15 ENCOUNTER — Telehealth: Payer: Self-pay | Admitting: Medical Oncology

## 2023-04-15 ENCOUNTER — Other Ambulatory Visit: Payer: Self-pay | Admitting: Internal Medicine

## 2023-04-15 DIAGNOSIS — C3492 Malignant neoplasm of unspecified part of left bronchus or lung: Secondary | ICD-10-CM

## 2023-04-15 DIAGNOSIS — R911 Solitary pulmonary nodule: Secondary | ICD-10-CM

## 2023-04-15 NOTE — Telephone Encounter (Signed)
Radiation referral -She requests consult with radiation oncology before proceeding any further .

## 2023-04-21 ENCOUNTER — Telehealth: Payer: Self-pay | Admitting: Medical Oncology

## 2023-04-21 NOTE — Telephone Encounter (Signed)
Frustrated with appts.  She was told that she cannot be seen by radiation oncology until she has a brain scan on 9/22 ( the earliest available).  Please advise.

## 2023-04-22 NOTE — Progress Notes (Addendum)
Thoracic Location of Tumor / Histology:  NM PET Image Restage (PS) Skull Base to Thigh 03/28/2023  IMPRESSION: 1. 12 mm medial right middle lobe pulmonary nodule is hypermetabolic and consistent with primary neoplasm. 2. 7 mm right lower lobe nodule and 7 mm right upper lobe nodule do not demonstrate any hypermetabolism but will need close surveillance. 3. No mediastinal or hilar lymphadenopathy. 4. No findings for osseous metastatic disease. 5. Abnormal appearance of the pancreatic head and mild hypermetabolism worrisome for neoplasm. Recommend MRI abdomen without and with contrast for further evaluation.   Aortic Atherosclerosis (ICD10-I70.0).  CT Chest Wo Contrast 03/03/2023  IMPRESSION: 1. Enlarging and concerning right lung nodules, the largest in the right middle lobe measuring up to 1.4 cm in diameter, highly suspicious for primary bronchogenic carcinoma. The right middle lobe nodule is large enough to evaluate by PET-CT which is recommended. 2. No evidence of thoracic adenopathy or pleural effusion. No suspicious left lung nodule status post upper lobe resection. 3. Aortic Atherosclerosis (ICD10-I70.0) and Emphysema (ICD10-J43.9)  Patient presented with symptoms of: found on imaging   Biopsies revealed:  05-13-2018   Tobacco/Marijuana/Snuff/ETOH use: Former smoker  Past/Anticipated interventions by cardiothoracic surgery, if any:  05-13-2018 PHYSICIAN:STEVEN C. HENDRICKSON, MD   OPERATIVE REPORT   DATE OF PROCEDURE:  05/13/2018   PREOPERATIVE DIAGNOSIS:  Left upper lobe lung nodule, probable stage IA (T1, N0) bronchogenic carcinoma.   POSTOPERATIVE DIAGNOSIS:  Stage IA nonsmall-cell carcinoma, right upper lobe (T1, N0).   PROCEDURES:   Left video-assisted thoracoscopy, Thoracoscopic Left upper lobectomy, Mediastinal lymph node dissection, Intercostal nerve block.   SURGEON:  Charlett Lango, MD  Loreli Slot, MD 04/14/2023  Impression: [name] is an 82 year old woman with a history of lung cancer.  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  Her past medical history is also significant for hypertension, hyperlipidemia, hiatal hernia, reflux, cardiac conduction disorder, osteopenia, and osteoarthritis.   Right middle lobe lung nodule-1.3 x 1.4 cm.  Has been growing significantly over the past couple of scans.  Hypermetabolic with an SUV of 5.75.  Almost certainly a primary bronchogenic carcinoma.  Is technically resectable but would require a middle lobectomy.   Right upper and lower lobe lung nodules-both about 7 mm in diameter.  I am very concerned that these also represent cancers, particularly the lower lobe nodule which was present on the CT a year ago but is increased in size.  Not large enough for PET resolution..   I discussed the potential for surgical resection of the right middle lobe nodule and possible wedge resection of the lower lobe nodule at the time of surgery.  The upper lobe nodule is central and an area of heavy emphysema not suitable for wedge resection.  We discussed the pros and cons of surgical approach.  She understands that surgery in general offers a better chance of a cure, but that may be significantly limited if the other nodules are cancerous as well.  She expressed significant concern about having another operation.   She would need pulmonary function testing prior to surgical resection.  One of her primary concerns is that she would require oxygen.  Hard to say without PFTs but I suspect she would not need oxygen permanently.   Given that she has multiple nodules I think she needs an MRI of the brain to rule out brain metastases.  I suspect that will be negative but I think it needs to be done certainly  before any consideration for surgery.   Radiology noticed some issues with the head of her pancreas and could potentially be a malignancy which would also impact how we  would address her lung cancer.  Will discuss with Dr. Arbutus Ped whether referrals been made regarding that issue.   Plan: Pulmonary function testing with and without bronchodilators Referral to radiation oncology for discussion of possible stereotactic radiation MR brain to rule out metastases given multiple lung nodules. Will discuss pancreatic findings with Dr. Arbutus Ped She will return in about 3 weeks after she seen radiation oncology to make a final decision.   Loreli Slot, MD [facility]  Past/Anticipated interventions by medical oncology, if any: Si Gaul, MD 04/01/2023  DIAGNOSIS:  1) suspicious right middle lobe hypermetabolic nodule concerning for metachronous lung cancer. 2) Stage IA (T1c, N0, M0) non-small cell lung cancer, adenocarcinoma with lymphovascular invasion   ASSESSMENT AND PLAN: This is a very pleasant 82 years old white female with stage IA (T1c, N0, M0) non-small cell lung cancer, adenocarcinoma with lymphovascular invasion status post left upper lobectomy with lymph node dissection in September 2019 under the care of Dr. Dorris Fetch The patient has been on observation but on a recent CT scan of the chest she was found to have enlarging right middle lobe lung nodule.  I ordered a PET scan which was performed recently and it showed 1.2 cm medial right middle lobe pulmonary nodule that is hypermetabolic and consistent with primary neoplasm.  There was 0.7 cm right lower lobe nodule and 0.7 right upper lobe pulmonary nodule that did not demonstrate any hypermetabolism but still require close surveillance.  There was no mediastinal or hilar lymphadenopathy and no finding of distant metastatic disease or osseous metastasis. I had a lengthy discussion with the patient and her family about these findings and treatment options. I recommended for her to see Dr. Dorris Fetch first to discuss surgical resection but if she is not a candidate, she  would be treated with SBRT to the right middle lobe lung nodule followed by close monitoring of the other small nodule in the right upper and right lower lobes. She may also need repeat bronchoscopy for confirmation of the tissue diagnosis. The patient and her family are in agreement with the current plan. I will see her back for follow-up visit in 1 months for evaluation and discussion of her treatment options after her visit with Dr. Dorris Fetch. The patient was advised to call immediately if she has any other concerning symptoms in the interval.   The patient voices understanding of current disease status and treatment options and is in agreement with the current care plan.  Signs/Symptoms Weight changes, if any: yes, did lose weight but not gaining it back Respiratory complaints, if any: yes, baseline cough with drainage from allergies Hemoptysis, if any: no Pain issues, if any:  no  SAFETY ISSUES: Prior radiation? no Pacemaker/ICD? Yes. Loop recorder for afib but battery dead, pt does not have afib. They though she did based on an issue that occurred during one of her hospital visits.  Possible current pregnancy?no Is the patient on methotrexate? no  Current Complaints / other details:  Pt wants to know plan overall and wants to get started. She feels like this has taken a while overall with her diagnosis and treatment planning (potential sx, etc).  Sister is in town to support her from out of town and pt would like to be able to have a timeline for her  sister to feel more comfortable as well. Pt also reports that she has started smoking again and would like to stop. She is going to talk to her PCP about getting help with this.

## 2023-04-23 ENCOUNTER — Ambulatory Visit (HOSPITAL_COMMUNITY)
Admission: RE | Admit: 2023-04-23 | Discharge: 2023-04-23 | Disposition: A | Discharge status: Home / Self Care | Payer: Medicare Other | Source: Ambulatory Visit | Attending: Thoracic Surgery (Cardiothoracic Vascular Surgery) | Admitting: Thoracic Surgery (Cardiothoracic Vascular Surgery)

## 2023-04-23 DIAGNOSIS — R059 Cough, unspecified: Secondary | ICD-10-CM | POA: Diagnosis not present

## 2023-04-23 DIAGNOSIS — R0609 Other forms of dyspnea: Secondary | ICD-10-CM | POA: Insufficient documentation

## 2023-04-23 DIAGNOSIS — R911 Solitary pulmonary nodule: Secondary | ICD-10-CM | POA: Insufficient documentation

## 2023-04-23 DIAGNOSIS — F17210 Nicotine dependence, cigarettes, uncomplicated: Secondary | ICD-10-CM | POA: Diagnosis not present

## 2023-04-23 DIAGNOSIS — R942 Abnormal results of pulmonary function studies: Secondary | ICD-10-CM | POA: Insufficient documentation

## 2023-04-23 LAB — PULMONARY FUNCTION TEST
DL/VA % pred: 63 %
DL/VA: 2.63 ml/min/mmHg/L
DLCO unc % pred: 58 %
DLCO unc: 10.19 ml/min/mmHg
FEF 25-75 Post: 1.02 L/sec
FEF 25-75 Pre: 0.68 L/sec
FEF2575-%Change-Post: 51 %
FEF2575-%Pred-Post: 89 %
FEF2575-%Pred-Pre: 59 %
FEV1-%Change-Post: 12 %
FEV1-%Pred-Post: 88 %
FEV1-%Pred-Pre: 78 %
FEV1-Post: 1.45 L
FEV1-Pre: 1.3 L
FEV1FVC-%Change-Post: 0 %
FEV1FVC-%Pred-Pre: 83 %
FEV6-%Change-Post: 11 %
FEV6-%Pred-Post: 111 %
FEV6-%Pred-Pre: 99 %
FEV6-Post: 2.34 L
FEV6-Pre: 2.09 L
FEV6FVC-%Change-Post: 0 %
FEV6FVC-%Pred-Post: 104 %
FEV6FVC-%Pred-Pre: 104 %
FVC-%Change-Post: 12 %
FVC-%Pred-Post: 106 %
FVC-%Pred-Pre: 95 %
FVC-Post: 2.39 L
FVC-Pre: 2.13 L
Post FEV1/FVC ratio: 61 %
Post FEV6/FVC ratio: 98 %
Pre FEV1/FVC ratio: 61 %
Pre FEV6/FVC Ratio: 98 %
RV % pred: 108 %
RV: 2.54 L
TLC % pred: 100 %
TLC: 4.8 L

## 2023-04-23 MED ORDER — ALBUTEROL SULFATE (2.5 MG/3ML) 0.083% IN NEBU
2.5000 mg | INHALATION_SOLUTION | Freq: Once | RESPIRATORY_TRACT | Status: AC
  Administered 2023-04-23: 2.5 mg via RESPIRATORY_TRACT

## 2023-04-23 NOTE — Telephone Encounter (Signed)
  Per Dr. Arbutus Ped .:"Not sure what is the relation between the MRI and seeing radiation oncology.  She is already scheduled to see Dr. Roselind Messier on 04/30/2023. "  ILVM  for pt to return my call about her appts and MRI.I told her to keep her appt with Dr. Roselind Messier.

## 2023-04-29 ENCOUNTER — Telehealth: Payer: Self-pay

## 2023-04-29 ENCOUNTER — Encounter: Payer: Self-pay | Admitting: Radiation Oncology

## 2023-04-29 NOTE — Telephone Encounter (Signed)
Rn left message for pt after failed attempt to reach pt to obtain pre consult information. Rn left message for pt with call back details and will call again this afternoon.

## 2023-04-29 NOTE — Progress Notes (Signed)
Radiation Oncology         [phone] ________________________________  Initial Outpatient Consultation  Name: [name] MRN: [id number]  Date: 04/30/2023  DOB: Dec 16, 1940  [id number], [facility], [place], [place]   REFERRING PHYSICIAN: Loreli Slot, *  DIAGNOSIS: The primary encounter diagnosis was Primary malignant neoplasm of right middle lobe of lung (HCC). Diagnoses of Adenocarcinoma of left lung, stage 1 (HCC) and Pancreatic mass were also pertinent to this visit.  New medial right middle lobe nodule concerning for non-small cell lung cancer recurrence. Recent PET scan also showed nodules in the RLL and RUL without hypermetabolism  History of stage IA (T1c, N0, M0) non-small cell lung cancer, adenocarcinoma with lymphovascular invasion diagnosed in 2019, s/p left upper lobectomy   HISTORY OF PRESENT ILLNESS::[name] is a 82 y.o. female who is accompanied by her supportive son. she is seen as a courtesy of Dr. Dorris Fetch for an opinion concerning radiation therapy as part of management for her recently diagnosed right lung nodules concerning for non-small cell lung cancer recurrence. The patient was initially diagnosed with non small cell lung cancer of the left upper lobe in 2019, s/p left upper lobectomy with lymph node dissection in September 2019 under the care of Dr. Dorris Fetch. Since that time, she has continued under observation with Dr. Arbutus Ped.   She recently presented for a restaging chest CT on 03/03/23 which demonstrated several enlarging and concerning right lung nodules ; the largest of which located in the right middle lobe measuring up to 1.4 cm in diameter, and with features highly suspicious for primary bronchogenic carcinoma. CT otherwise showed no evidence of thoracic adenopathy, pleural effusion, and no abnormalities in the left lung s/p left upper lobe resection.  To further evaluate CT findings, Dr.  Arbutus Ped recommended proceeding with further work-up. A PET scan was subsequently performed on 03/28/23 which demonstrated hypermetabolism associated with a 12 mm medial right middle lobe nodule, consistent with primary neoplasm. PET also showed a 7 mm RLL nodule and a 7 mm RUL nodule which were both without any hypermetabolism. PET otherwise showed no evidence of lymphadenopathy and no evidence of osseous metastatic disease. However, an abnormal appearing pancreatic head with mild hypermetabolism was noted which warrants further evaluation.   Subsequently, the patient was referred to Dr. Dorris Fetch on 04/14/23 to discuss surgical treatment options. Dr. Dorris Fetch reviewed PET findings and is concerned that RUL and RLL nodules also represent malignancy, especially since the RLL nodule has gradually increased in size since imaging performed last year.   In light of her prior left upper left lobectomy and the possibility that the other 2 nodules in the right lung represent malignancy, the patient has expressed significant concern about having another operation. She has however agreed to proceed with pulmonary function testing to determine her candidacy for surgery. After our discussion today, she will return to Dr. Dorris Fetch to make a final decision regarding surgery.   If she is not a surgical candidate, Dr. Arbutus Ped recommends SBRT to the right middle lobe lung nodule followed by close monitoring of the other small nodules in the right upper and right lower lobes.   In the setting of multiple nodules in the right lung, Dr. Dorris Fetch has also ordered an MRI of brain to rule out any brain metastases.   Patient endorses some shortness of breath with exertion and mild congestion which she attributes to her allergies. She otherwise denies any other issues in her breathing.  PREVIOUS RADIATION THERAPY: No  PAST MEDICAL HISTORY:  Past Medical History:  Diagnosis Date   Anxiety    Blood glucose  elevated 05/27/2012   pt. 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   Overview:  Dr Elnoria Howard  Last Assessment & Plan:  Relevant Hx: Course: Daily Update: Today's Plan:    GERD (gastroesophageal reflux disease)    Hemorrhoid 12/06/2015   History of hiatal hernia    HLD (hyperlipidemia) 12/03/2011   Hypertension    Lung cancer (HCC) dx'd 04/2018   Osteopenia 05/30/2015   Overview:  Bone density 05/2015 started fosamax    Primary localized osteoarthritis of left knee    Primary localized osteoarthritis of right knee 12/06/2015    PAST SURGICAL HISTORY: Past Surgical History:  Procedure Laterality Date   ABDOMINAL HYSTERECTOMY     CATARACT EXTRACTION, BILATERAL     COLONOSCOPY     EYE SURGERY Bilateral    Cataract   implantable loop recorder placement  10/26/2018   MDT LINQ Reveal XT ILR implanted for evaluation of palpitations and atrial fibrillation in office by Dr Johney Frame, Device SN [id number])    KNEE SURGERY  05/01/2016   left uncompartmental    PARTIAL KNEE ARTHROPLASTY Right 12/18/2015   Procedure: UNICOMPARTMENTAL RIGHT KNEE;  Surgeon: Salvatore Marvel, MD;  Location: [facility] OR;  Service: Orthopedics;  Laterality: Right;   PARTIAL KNEE ARTHROPLASTY Left 05/01/2016   Procedure: UNICOMPARTMENTAL KNEE;  Surgeon: Salvatore Marvel, MD;  Location: [facility] OR;  Service: Orthopedics;  Laterality: Left;   TOTAL ABDOMINAL HYSTERECTOMY W/ BILATERAL SALPINGOOPHORECTOMY     VIDEO ASSISTED THORACOSCOPY (VATS)/ LOBECTOMY Left 05/13/2018   Procedure: VIDEO ASSISTED THORACOSCOPY (VATS)/LEFT UPPER LOBECTOMY;  Surgeon: Loreli Slot, MD;  Location: [facility] OR;  Service: Thoracic;  Laterality: Left;    FAMILY  HISTORY:  Family History  Problem Relation Age of Onset   Congestive Heart Failure Mother    Breast cancer Sister    Breast cancer Niece     SOCIAL HISTORY:  Social History   Tobacco Use   Smoking status: Former    Current packs/day: 0.00    Types: Cigarettes    Start date: 02/09/1969    Quit date: 02/10/2019    Years since quitting: 4.2   Smokeless tobacco: Never   Tobacco comments:    only one cigarette occasionally  Vaping Use   Vaping status: Never Used  Substance Use Topics   Alcohol use: Yes    Alcohol/week: 1.0 standard drink of alcohol    Types: 1 Glasses of wine per week    Comment: social rare   Drug use: No    ALLERGIES:  Allergies  Allergen Reactions   Ambien [Zolpidem Tartrate] Other (See Comments)    ARRYTHMIA   Sulfa Antibiotics Other (See Comments)    UNSPECIFIED, childhood reaction   Codeine Hives and Rash   Hydrocodone Other (See Comments)    FATIGUE   Meperidine Nausea And Vomiting    MEDICATIONS:  Current Outpatient Medications  Medication Sig Dispense Refill   Calcium Carb-Cholecalciferol (CALCIUM-VITAMIN D3) 600-200 MG-UNIT TABS Take by mouth daily.     cetirizine (ZYRTEC) 10 MG tablet Take  10 mg by mouth as needed for allergies.     diphenhydramine-acetaminophen (TYLENOL PM) 25-500 MG TABS tablet Take 2 tablets by mouth at bedtime as needed (for sleep).     escitalopram (LEXAPRO) 20 MG tablet Take by mouth daily.     metoprolol succinate (TOPROL-XL) 50 MG 24 hr tablet Take 50 mg by mouth daily. Take with or immediately following a meal.     Multiple Vitamins-Minerals (MULTIVITAMIN PO) Take 1 tablet by mouth daily.     pantoprazole (PROTONIX) 40 MG tablet Take 40 mg by mouth daily.     polyethylene glycol (MIRALAX / GLYCOLAX) packet Take 8.5 g by mouth every other day.     simvastatin (ZOCOR) 20 MG tablet Take 20 mg by mouth daily.     aspirin EC 81 MG tablet Take 1 tablet (81 mg total) by mouth daily. Swallow whole. (Patient not taking:  Reported on 04/29/2023) 90 tablet 3   No current facility-administered medications for this encounter.    REVIEW OF SYSTEMS:  Notable for that above.    PHYSICAL EXAM:  height is 5\' 2"  (1.575 m) and weight is 142 lb 6 oz (64.6 kg). Her temporal temperature is 97.1 F (36.2 C) (abnormal). Her blood pressure is 147/81 (abnormal) and her pulse is 83. Her respiration is 18 and oxygen saturation is 100%.   General: Alert and oriented, in no acute distress HEENT: Head is normocephalic. Extraocular movements are intact.  Neck: Neck is supple, no palpable cervical or supraclavicular lymphadenopathy. Heart: Regular in rate and rhythm with no murmurs, rubs, or gallops. Chest: Clear to auscultation bilaterally, with no rhonchi, wheezes, or rales. Abdomen: Soft, nontender, nondistended, with no rigidity or guarding.  Extremities: No cyanosis or edema. Lymphatics: see Neck Exam Skin: No concerning lesions. Musculoskeletal: symmetric strength and muscle tone throughout. Neurologic: Cranial nerves II through XII are grossly intact. No obvious focalities. Speech is fluent. Coordination is intact. Psychiatric: Judgment and insight are intact. Affect is appropriate.   ECOG = 0  0 - Asymptomatic (Fully active, able to carry on all predisease activities without restriction)  1 - Symptomatic but completely ambulatory (Restricted in physically strenuous activity but ambulatory and able to carry out work of a light or sedentary nature. For example, light housework, office work)  2 - Symptomatic, <50% in bed during the day (Ambulatory and capable of all self care but unable to carry out any work activities. Up and about more than 50% of waking hours)  3 - Symptomatic, >50% in bed, but not bedbound (Capable of only limited self-care, confined to bed or chair 50% or more of waking hours)  4 - Bedbound (Completely disabled. Cannot carry on any self-care. Totally confined to bed or chair)  5 - Death   Santiago Glad  MM, Creech RH, Tormey DC, et al. [phone]). "Toxicity and response criteria of the [facility]". Am. Evlyn Clines. Oncol. 5 (6): 649-55  LABORATORY DATA:  Lab Results  Component Value Date   WBC 7.5 03/03/2023   HGB 14.1 03/03/2023   HCT 41.4 03/03/2023   MCV 97.2 03/03/2023   PLT 197 03/03/2023   NEUTROABS 5.6 03/03/2023   Lab Results  Component Value Date   NA 137 03/03/2023   K 4.3 03/03/2023   CL 103 03/03/2023   CO2 29 03/03/2023   GLUCOSE 138 (H) 03/03/2023   BUN 16 03/03/2023   CREATININE 0.87 03/03/2023   CALCIUM 10.1 03/03/2023      RADIOGRAPHY: No results found.  IMPRESSION: New medial right middle lobe nodule concerning for non-small cell lung cancer recurrence. Recent PET scan also showed nodules in the RLL and RUL without hypermetabolism  History of stage IA (T1c, N0, M0) non-small cell lung cancer, adenocarcinoma with lymphovascular invasion diagnosed in 2019, s/p left upper lobectomy   Patient is not interested in proceeding with surgery at this time due concerns of her age and the effects it may have on her breathing.  We have recommended she cancel her appointment with Dr. Dorris Fetch if she does not wish to pursue surgery any further.  She is a good candidate for radiation therapy to the suspicious lung nodules.  We talked about treatment to the larger PET positive lesion or  all 3 lesions given that they are all concerning for malignancy.  She is more in favor of treating all 3 lesions at this time.  Notably, patient's PET from 04/01/23 showed an abnormal appearance of the pancreatic head with mild hypermetabolism. We discussed these findings with the patient and she would like to pursue further work-up.   PLAN: Anticipate ultra hypofractionated accelerated radiation therapy (10 Fx's) to the lung nodule closest to the heart in conjunction with SBRT to the other two pulmonary nodules.  She is not a candidate for SBRT for the medial lesion in light  of its close proximity to the heart.  MRI of the abdomen ordered today to evaluate hypermetabolism seen at the pancreatic head. We will call the patient with these results.   Patient is scheduled for a brain MRI to complete metastatic workup. This is scheduled for 06/01/23. We are working to see if we can get this moved up.    60 minutes of total time was spent for this patient encounter, including preparation, face-to-face counseling with the patient and coordination of care, physical exam, and documentation of the encounter.   ------------------------------------------------   Joyice Faster, PA-C   Billie Lade, PhD, MD  This document serves as a record of services personally performed by Antony Blackbird, MD. It was created on his behalf by Neena Rhymes, a trained medical scribe. The creation of this record is based on the scribe's personal observations and the provider's statements to them. This document has been checked and approved by the attending provider.

## 2023-04-29 NOTE — Telephone Encounter (Signed)
 RN called pt for meaningful use and nurse evaluation information. Consult note completed and routed to Dr. Roselind Messier for review.

## 2023-04-30 ENCOUNTER — Ambulatory Visit
Admission: RE | Admit: 2023-04-30 | Discharge: 2023-04-30 | Disposition: A | Discharge status: Home / Self Care | Payer: Medicare Other | Source: Ambulatory Visit | Attending: Radiation Oncology | Admitting: Radiation Oncology

## 2023-04-30 VITALS — BP 147/81 | HR 83 | Temp 97.1°F | Resp 18 | Ht 62.0 in | Wt 142.4 lb

## 2023-04-30 DIAGNOSIS — Z79899 Other long term (current) drug therapy: Secondary | ICD-10-CM | POA: Insufficient documentation

## 2023-04-30 DIAGNOSIS — R1909 Other intra-abdominal and pelvic swelling, mass and lump: Secondary | ICD-10-CM | POA: Diagnosis not present

## 2023-04-30 DIAGNOSIS — C3492 Malignant neoplasm of unspecified part of left bronchus or lung: Secondary | ICD-10-CM

## 2023-04-30 DIAGNOSIS — Z7982 Long term (current) use of aspirin: Secondary | ICD-10-CM | POA: Insufficient documentation

## 2023-04-30 DIAGNOSIS — M8580 Other specified disorders of bone density and structure, unspecified site: Secondary | ICD-10-CM | POA: Diagnosis not present

## 2023-04-30 DIAGNOSIS — I10 Essential (primary) hypertension: Secondary | ICD-10-CM | POA: Diagnosis not present

## 2023-04-30 DIAGNOSIS — C342 Malignant neoplasm of middle lobe, bronchus or lung: Secondary | ICD-10-CM | POA: Insufficient documentation

## 2023-04-30 DIAGNOSIS — K219 Gastro-esophageal reflux disease without esophagitis: Secondary | ICD-10-CM | POA: Insufficient documentation

## 2023-04-30 DIAGNOSIS — R918 Other nonspecific abnormal finding of lung field: Secondary | ICD-10-CM | POA: Diagnosis not present

## 2023-04-30 DIAGNOSIS — Z87891 Personal history of nicotine dependence: Secondary | ICD-10-CM | POA: Insufficient documentation

## 2023-04-30 DIAGNOSIS — E785 Hyperlipidemia, unspecified: Secondary | ICD-10-CM | POA: Insufficient documentation

## 2023-04-30 DIAGNOSIS — Z803 Family history of malignant neoplasm of breast: Secondary | ICD-10-CM | POA: Diagnosis not present

## 2023-04-30 DIAGNOSIS — K8689 Other specified diseases of pancreas: Secondary | ICD-10-CM

## 2023-05-01 ENCOUNTER — Other Ambulatory Visit: Payer: Self-pay | Admitting: Radiology

## 2023-05-01 ENCOUNTER — Telehealth: Payer: Self-pay | Admitting: *Deleted

## 2023-05-01 ENCOUNTER — Other Ambulatory Visit: Payer: Self-pay

## 2023-05-01 DIAGNOSIS — C3492 Malignant neoplasm of unspecified part of left bronchus or lung: Secondary | ICD-10-CM

## 2023-05-01 DIAGNOSIS — C342 Malignant neoplasm of middle lobe, bronchus or lung: Secondary | ICD-10-CM

## 2023-05-01 DIAGNOSIS — K8689 Other specified diseases of pancreas: Secondary | ICD-10-CM

## 2023-05-01 NOTE — Telephone Encounter (Signed)
Called patient to inform of MRI for 05-04-23- arrival time- 11:30 am @ [facility], patient to be NPO- 4 hrs. prior to test, informed patient to check in @ ED, patient was approved to have these MRI (with no problem with her loop recorder interfering with those scans), spoke with patient and she is aware of these scans and the instructions

## 2023-05-04 ENCOUNTER — Ambulatory Visit (HOSPITAL_COMMUNITY)
Admission: RE | Admit: 2023-05-04 | Discharge: 2023-05-04 | Disposition: A | Discharge status: Home / Self Care | Payer: Medicare Other | Source: Ambulatory Visit | Attending: Radiology | Admitting: Radiology

## 2023-05-04 DIAGNOSIS — K8689 Other specified diseases of pancreas: Secondary | ICD-10-CM | POA: Insufficient documentation

## 2023-05-04 DIAGNOSIS — C342 Malignant neoplasm of middle lobe, bronchus or lung: Secondary | ICD-10-CM | POA: Insufficient documentation

## 2023-05-04 MED ORDER — GADOBUTROL 1 MMOL/ML IV SOLN
7.0000 mL | Freq: Once | INTRAVENOUS | Status: AC | PRN
  Administered 2023-05-04: 7 mL via INTRAVENOUS

## 2023-05-05 ENCOUNTER — Other Ambulatory Visit: Payer: Self-pay | Admitting: Radiology

## 2023-05-05 DIAGNOSIS — K8689 Other specified diseases of pancreas: Secondary | ICD-10-CM

## 2023-05-08 ENCOUNTER — Other Ambulatory Visit: Payer: Self-pay | Admitting: Radiation Oncology

## 2023-05-08 DIAGNOSIS — K8689 Other specified diseases of pancreas: Secondary | ICD-10-CM

## 2023-05-14 ENCOUNTER — Telehealth: Payer: Self-pay | Admitting: Gastroenterology

## 2023-05-14 NOTE — Telephone Encounter (Signed)
Good Morning Dr Meridee Score   Patient has a referral for a Pancreatic mass . Please advise.

## 2023-05-15 ENCOUNTER — Other Ambulatory Visit: Payer: Self-pay

## 2023-05-15 DIAGNOSIS — K8689 Other specified diseases of pancreas: Secondary | ICD-10-CM

## 2023-05-15 DIAGNOSIS — R978 Other abnormal tumor markers: Secondary | ICD-10-CM

## 2023-05-15 NOTE — Telephone Encounter (Signed)
The pt has been advised of the EUS and lab order.  She will call if she has any concerns or questions.

## 2023-05-15 NOTE — Telephone Encounter (Signed)
EUS has been scheduled for 05/22/23 at 1 pm with GM at [facility]   Lab orders entered   Left message on machine to call back

## 2023-05-15 NOTE — Telephone Encounter (Signed)
[name], Try to offer this patient 9/12 @100PM  for Upper EUS. It is going to eat into my Inpatient time, but she has a pancreatic mass and we should try to squeeze her in. I recommend a CMP and CA19-9, be drawn ASAP. If she has biliary obstruction with significantly abnormal LFTs, she will need to be admitted to the hospital for [place] to do EUS/ERCP, as we do not have availability as an Outpatient for this to occur otherwise. Please move forward as I have outlined. GM

## 2023-05-16 ENCOUNTER — Other Ambulatory Visit (INDEPENDENT_AMBULATORY_CARE_PROVIDER_SITE_OTHER): Payer: Medicare Other

## 2023-05-16 ENCOUNTER — Encounter (HOSPITAL_COMMUNITY): Payer: Self-pay | Admitting: Gastroenterology

## 2023-05-16 DIAGNOSIS — K8689 Other specified diseases of pancreas: Secondary | ICD-10-CM

## 2023-05-16 DIAGNOSIS — R978 Other abnormal tumor markers: Secondary | ICD-10-CM

## 2023-05-16 LAB — COMPREHENSIVE METABOLIC PANEL
ALT: 9 U/L (ref 0–35)
AST: 15 U/L (ref 0–37)
Albumin: 3.9 g/dL (ref 3.5–5.2)
Alkaline Phosphatase: 66 U/L (ref 39–117)
BUN: 14 mg/dL (ref 6–23)
CO2: 29 meq/L (ref 19–32)
Calcium: 9.5 mg/dL (ref 8.4–10.5)
Chloride: 99 meq/L (ref 96–112)
Creatinine, Ser: 0.79 mg/dL (ref 0.40–1.20)
GFR: 69.26 mL/min (ref >60.00)
Glucose, Bld: 118 mg/dL — ABNORMAL HIGH (ref 70–99)
Potassium: 3.8 meq/L (ref 3.5–5.1)
Sodium: 135 meq/L (ref 135–145)
Total Bilirubin: 0.7 mg/dL (ref 0.2–1.2)
Total Protein: 6.6 g/dL (ref 6.0–8.3)

## 2023-05-16 NOTE — Progress Notes (Signed)
[name]  PCP-Robert Beam MD Cardiologist- Was [facility]  EKG-08/08/21 [id number] *device interrogation for loop 07/07/22 Cath- n/a Stress-n/a ICD/PM-n/a Blood thinner-n/a GLP-1-n/a  Hx: Cardiac conduction disorder, HTN, Lung cancer VATS 2019. Patient was in hospital for lung procedure and was having trouble sleeping so was given [place] and was went  into afib. After d/c saw [facility] and they put in a loop recorder. During duration of loop recorder there was no instance of afib, so cardiologist said didnt need to see, thought it may have been the [place]. Last seen was 08/08/21. Patient contacted about 5 months ago that loop recorder battery was dead and could have it removed or just left in and she opted to leave in. She stated no cardiac issues, no mobility issues. Does f/u with cardiothoraic surgeon, last visit 8/5 concerned with some new growing nodules in right lung did recommend PFTS d/t possibly having another resection those were done 8/14, pt stating breathing good.  Anesthesia Review: Yes

## 2023-05-19 LAB — CANCER ANTIGEN 19-9: CA 19-9: 256 U/mL — ABNORMAL HIGH (ref <34)

## 2023-05-22 ENCOUNTER — Encounter (HOSPITAL_COMMUNITY): Payer: Self-pay | Admitting: Gastroenterology

## 2023-05-22 ENCOUNTER — Ambulatory Visit (HOSPITAL_COMMUNITY): Payer: Medicare Other | Admitting: Anesthesiology

## 2023-05-22 ENCOUNTER — Ambulatory Visit (HOSPITAL_COMMUNITY)
Admission: RE | Admit: 2023-05-22 | Discharge: 2023-05-22 | Disposition: A | Discharge status: Home / Self Care | Payer: Medicare Other | Attending: Gastroenterology | Admitting: Gastroenterology

## 2023-05-22 ENCOUNTER — Encounter (HOSPITAL_COMMUNITY)
Admission: RE | Disposition: A | Discharge status: Home / Self Care | Payer: Self-pay | Source: Home / Self Care | Attending: Gastroenterology

## 2023-05-22 DIAGNOSIS — K219 Gastro-esophageal reflux disease without esophagitis: Secondary | ICD-10-CM | POA: Diagnosis not present

## 2023-05-22 DIAGNOSIS — K869 Disease of pancreas, unspecified: Secondary | ICD-10-CM | POA: Diagnosis present

## 2023-05-22 DIAGNOSIS — M8580 Other specified disorders of bone density and structure, unspecified site: Secondary | ICD-10-CM | POA: Insufficient documentation

## 2023-05-22 DIAGNOSIS — C250 Malignant neoplasm of head of pancreas: Secondary | ICD-10-CM | POA: Insufficient documentation

## 2023-05-22 DIAGNOSIS — Z87891 Personal history of nicotine dependence: Secondary | ICD-10-CM | POA: Insufficient documentation

## 2023-05-22 DIAGNOSIS — I4891 Unspecified atrial fibrillation: Secondary | ICD-10-CM | POA: Diagnosis not present

## 2023-05-22 DIAGNOSIS — K8689 Other specified diseases of pancreas: Secondary | ICD-10-CM | POA: Diagnosis not present

## 2023-05-22 DIAGNOSIS — Z85828 Personal history of other malignant neoplasm of skin: Secondary | ICD-10-CM | POA: Insufficient documentation

## 2023-05-22 DIAGNOSIS — K2970 Gastritis, unspecified, without bleeding: Secondary | ICD-10-CM

## 2023-05-22 DIAGNOSIS — K2950 Unspecified chronic gastritis without bleeding: Secondary | ICD-10-CM

## 2023-05-22 DIAGNOSIS — K449 Diaphragmatic hernia without obstruction or gangrene: Secondary | ICD-10-CM | POA: Insufficient documentation

## 2023-05-22 DIAGNOSIS — Z902 Acquired absence of lung [part of]: Secondary | ICD-10-CM | POA: Insufficient documentation

## 2023-05-22 DIAGNOSIS — K222 Esophageal obstruction: Secondary | ICD-10-CM | POA: Insufficient documentation

## 2023-05-22 DIAGNOSIS — Z85118 Personal history of other malignant neoplasm of bronchus and lung: Secondary | ICD-10-CM | POA: Insufficient documentation

## 2023-05-22 DIAGNOSIS — I10 Essential (primary) hypertension: Secondary | ICD-10-CM | POA: Diagnosis not present

## 2023-05-22 DIAGNOSIS — E785 Hyperlipidemia, unspecified: Secondary | ICD-10-CM

## 2023-05-22 DIAGNOSIS — K317 Polyp of stomach and duodenum: Secondary | ICD-10-CM

## 2023-05-22 HISTORY — PX: EUS: SHX5427

## 2023-05-22 HISTORY — PX: POLYPECTOMY: SHX5525

## 2023-05-22 HISTORY — PX: FINE NEEDLE ASPIRATION: SHX6590

## 2023-05-22 HISTORY — PX: BIOPSY: SHX5522

## 2023-05-22 HISTORY — PX: ESOPHAGOGASTRODUODENOSCOPY (EGD) WITH PROPOFOL: SHX5813

## 2023-05-22 SURGERY — UPPER ENDOSCOPIC ULTRASOUND (EUS) RADIAL
Anesthesia: Monitor Anesthesia Care

## 2023-05-22 MED ORDER — PROPOFOL 500 MG/50ML IV EMUL
INTRAVENOUS | Status: DC | PRN
  Administered 2023-05-22: 150 ug/kg/min via INTRAVENOUS

## 2023-05-22 MED ORDER — CIPROFLOXACIN IN D5W 400 MG/200ML IV SOLN
INTRAVENOUS | Status: AC
  Filled 2023-05-22: qty 200

## 2023-05-22 MED ORDER — EPHEDRINE SULFATE (PRESSORS) 50 MG/ML IJ SOLN
INTRAMUSCULAR | Status: DC | PRN
  Administered 2023-05-22: 10 mg via INTRAVENOUS

## 2023-05-22 MED ORDER — SODIUM CHLORIDE 0.9 % IV SOLN: INTRAVENOUS | Status: DC

## 2023-05-22 MED ORDER — LACTATED RINGERS IV SOLN: INTRAVENOUS | Status: DC

## 2023-05-22 MED ORDER — CIPROFLOXACIN HCL 500 MG PO TABS: 500.0000 mg | ORAL_TABLET | Freq: Two times a day (BID) | ORAL | 0 refills | Status: AC

## 2023-05-22 MED ORDER — CIPROFLOXACIN IN D5W 400 MG/200ML IV SOLN
INTRAVENOUS | Status: DC | PRN
  Administered 2023-05-22: 400 mg via INTRAVENOUS

## 2023-05-22 NOTE — Op Note (Signed)
[facility] Patient Name: [name] Procedure Date: 05/22/2023 MRN: [id number] Attending MD: Corliss Parish , MD, [id number] Date of Birth: 1940-09-29 CSN: [id number] Age: 82 Admit Type: Outpatient Procedure:                Upper EUS Indications:              Suspected mass in pancreas on MRCP Providers:                Corliss Parish, MD, Margaree Mackintosh, RN,                            Salley Scarlet, Technician, Mirian Mo, CRNA Referring MD:             Billie Lade, Velora Heckler. Max Sane. Beam Medicines:                Monitored Anesthesia Care, Cipro 400 mg IV Complications:            No immediate complications. Estimated Blood Loss:     Estimated blood loss was minimal. Procedure:                Pre-Anesthesia Assessment:                           - Prior to the procedure, a History and Physical                            was performed, and patient medications and                            allergies were reviewed. The patient's tolerance of                            previous anesthesia was also reviewed. The risks                            and benefits of the procedure and the sedation                            options and risks were discussed with the patient.                            All questions were answered, and informed consent                            was obtained. Prior Anticoagulants: The patient has                            taken no anticoagulant or antiplatelet agents. ASA                            Grade Assessment: III - A patient with severe                            systemic disease. After reviewing the risks and  benefits, the patient was deemed in satisfactory                            condition to undergo the procedure.                           After obtaining informed consent, the endoscope was                            passed under direct vision. Throughout the                             procedure, the patient's blood pressure, pulse, and                            oxygen saturations were monitored continuously. The                            [id number]-H190 ([id number]) Olympus endoscope was introduced                            through the mouth, and advanced to the second part                            of duodenum. The [id number]-[id number] ([id number]) Olympus                            duodenoscope was introduced through the mouth, and                            advanced to the area of papilla. The [id number]                            ([id number]) Olympus linear ultrasound scope was                            introduced through the mouth, and advanced to the                            duodenum for ultrasound examination from the                            stomach and duodenum. The upper EUS was                            accomplished without difficulty. The patient                            tolerated the procedure. Scope In: Scope Out: Findings:      ENDOSCOPIC FINDING: :      No gross lesions were noted in the entire esophagus.      A non-obstructing Schatzki ring was found at the gastroesophageal       junction.      The Z-line was irregular and was  found 35 cm from the incisors.      A 4 cm hiatal hernia was present.      Patchy mildly erythematous mucosa without bleeding was found in the       entire examined stomach. Biopsies were taken with a cold forceps for       histology and Helicobacter pylori testing.      A single 8 mm sessile polyp with no bleeding was found in the duodenal       bulb. The polyp was removed with a cold snare. Resection and retrieval       were complete.      No other gross lesions were noted in the duodenal bulb, in the first       portion of the duodenum and in the second portion of the duodenum.      The major papilla was normal.      ENDOSONOGRAPHIC FINDING: :      An irregular mass was identified in the pancreatic head. The mass was        hypoechoic. The mass measured 29 mm by 28 mm in maximal cross-sectional       diameter. The outer margins were irregular. There was sonographic       evidence suggesting interface loss <15 mm and abutment into the       gastroduodenal artery. An intact interface was seen between the mass and       the superior mesenteric artery and celiac trunk suggesting a lack of       invasion. The remainder of the pancreas was examined. The       endosonographic appearance of parenchyma and the upstream pancreatic       duct indicated duct dilation (up to 8.8 mm within the head and       decreasing size towards the tail), prominent ductal side-branches, a       tortuous/ectatic duct and parenchymal atrophy. 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 Fine needle biopsy was performed of the mass. Color Doppler       imaging was utilized prior to needle puncture to confirm a lack of       significant vascular structures within the needle path. Six passes were       made with the 22 gauge Acquire biopsy needle using a transduodenal       approach. Visible cores of tissue were obtained. Preliminary cytologic       examination and touch preps were performed. The cellularity of the       specimen was adequate. Final cytology results are pending.      A small amount of hyperechoic material consistent with sludge was       visualized endosonographically in the gallbladder.      There was no sign of significant endosonographic abnormality, though       prominence was noted of the common bile duct and in the common hepatic       duct.      Endosonographic imaging of the ampulla showed no intramural       (subepithelial) lesion.      Endosonographic imaging in the visualized portion of the left lobe of       the liver showed a small subcentimeter cyst but no other significant       masses or lesions.  No malignant-appearing lymph  nodes were visualized in the celiac region       (level 20), peripancreatic region and porta hepatis region.      The celiac region was visualized. Impression:               EGD impression:                           - No gross lesions in the entire esophagus.                            Non-obstructing Schatzki ring. Z-line irregular, 35                            cm from the incisors.                           - 4 cm hiatal hernia.                           - Erythematous mucosa in the stomach. Biopsied.                           - A single duodenal polyp. Resected and retrieved.                           - No other gross lesions in the duodenal bulb, in                            the first portion of the duodenum and in the second                            portion of the duodenum.                           - Normal major papilla.                           EUS impression:                           - A mass was identified in the pancreatic head.                            Cytology results are pending. 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 The staging applies if  malignancy is confirmed. Fine needle biopsy                            performed.                           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                           - Hyperechoic material consistent with sludge was                            visualized endosonographically in the gallbladder.                           - There was no sign of significant pathology in the                            common bile duct  and in the common hepatic duct.                           - No malignant-appearing lymph nodes were                            visualized in the celiac region (level 20),                            peripancreatic region and porta hepatis region.                           -Small cyst subcentimeter noted in the visualized                            left lobe of the liver but no other masses or                            lesions. Moderate Sedation:      Not Applicable - Patient had care per Anesthesia. Recommendation:           - The patient will be observed post-procedure,                            until all discharge criteria are met.                           - Discharge patient to home.                           -  Patient has a contact number available for                            emergencies. The signs and symptoms of potential                            delayed complications were discussed with the                            patient. Return to normal activities tomorrow.                            Written discharge instructions were provided to the                            patient.                           - Low fat diet.                           - Observe patient's clinical course.                           - Monitor for signs/symptoms of bleeding,                            perforation, and infection. If issues please call                            our number to get further assistance as needed.                           - Await cytology results and await path results.                           - Ciprofloxacin 500 mg twice daily for 3 days total                            to decrease risk of post interventional infectious                            risk.                           - Continue present medications otherwise.                           - The findings and recommendations were discussed                            with the patient.                           - The  findings and recommendations were discussed  with the patient's family. Procedure Code(s):        --- Professional ---                           [id number], Esophagogastroduodenoscopy, flexible,                            transoral; with transendoscopic ultrasound-guided                            intramural or transmural fine needle                            aspiration/biopsy(s), (includes endoscopic                            ultrasound examination limited to the esophagus,                            stomach or duodenum, and adjacent structures)                           43251, Esophagogastroduodenoscopy, flexible,                            transoral; with removal of tumor(s), polyp(s), or                            other lesion(s) by snare technique                           43239, 59, Esophagogastroduodenoscopy, flexible,                            transoral; with biopsy, single or multiple Diagnosis Code(s):        --- Professional ---                           K22.2, Esophageal obstruction                           K22.89, Other specified disease of esophagus                           K44.9, Diaphragmatic hernia without obstruction or                            gangrene                           K31.89, Other diseases of stomach and duodenum                           K31.7, Polyp of stomach and duodenum                           K86.89, Other specified diseases of pancreas  I89.9, Noninfective disorder of lymphatic vessels                            and lymph nodes, unspecified                           K83.8, Other specified diseases of biliary tract                           R93.3, Abnormal findings on diagnostic imaging of                            other parts of digestive tract CPT copyright 2022 [employer]. All rights reserved. The codes documented in this report are preliminary and upon coder review may  be  revised to meet current compliance requirements. Corliss Parish, MD 05/22/2023 2:07:24 PM Number of Addenda: 0

## 2023-05-22 NOTE — Discharge Instructions (Signed)
YOU HAD AN ENDOSCOPIC PROCEDURE TODAY: Refer to the procedure report and other information in the discharge instructions given to you for any specific questions about what was found during the examination. If this information does not answer your questions, please call [place] office at [phone] to clarify.   YOU SHOULD EXPECT: Some feelings of bloating in the abdomen. Passage of more gas than usual. Walking can help get rid of the air that was put into your GI tract during the procedure and reduce the bloating. If you had a lower endoscopy (such as a colonoscopy or flexible sigmoidoscopy) you may notice spotting of blood in your stool or on the toilet paper. Some abdominal soreness may be present for a day or two, also.  DIET: Your first meal following the procedure should be a light meal and then it is ok to progress to your normal diet. A half-sandwich or bowl of soup is an example of a good first meal. Heavy or fried foods are harder to digest and may make you feel nauseous or bloated. Drink plenty of fluids but you should avoid alcoholic beverages for 24 hours. If you had a esophageal dilation, please see attached instructions for diet.    ACTIVITY: Your care partner should take you home directly after the procedure. You should plan to take it easy, moving slowly for the rest of the day. You can resume normal activity the day after the procedure however YOU SHOULD NOT DRIVE, use power tools, machinery or perform tasks that involve climbing or major physical exertion for 24 hours (because of the sedation medicines used during the test).   SYMPTOMS TO REPORT IMMEDIATELY: A gastroenterologist can be reached at any hour. Please call [phone]  for any of the following symptoms:  Following lower endoscopy (colonoscopy, flexible sigmoidoscopy) Excessive amounts of blood in the stool  Significant tenderness, worsening of abdominal pains  Swelling of the abdomen that is new, acute  Fever of 100 or  higher  Following upper endoscopy (EGD, EUS, ERCP, esophageal dilation) Vomiting of blood or coffee ground material  New, significant abdominal pain  New, significant chest pain or pain under the shoulder blades  Painful or persistently difficult swallowing  New shortness of breath  Black, tarry-looking or red, bloody stools  FOLLOW UP:  If any biopsies were taken you will be contacted by phone or by letter within the next 1-3 weeks. Call [phone]  if you have not heard about the biopsies in 3 weeks.  Please also call with any specific questions about appointments or follow up tests.YOU HAD AN ENDOSCOPIC PROCEDURE TODAY: Refer to the procedure report and other information in the discharge instructions given to you for any specific questions about what was found during the examination. If this information does not answer your questions, please call [place] office at [phone] to clarify.   YOU SHOULD EXPECT: Some feelings of bloating in the abdomen. Passage of more gas than usual. Walking can help get rid of the air that was put into your GI tract during the procedure and reduce the bloating. If you had a lower endoscopy (such as a colonoscopy or flexible sigmoidoscopy) you may notice spotting of blood in your stool or on the toilet paper. Some abdominal soreness may be present for a day or two, also.  DIET: Your first meal following the procedure should be a light meal and then it is ok to progress to your normal diet. A half-sandwich or bowl of soup is an example of a   good first meal. Heavy or fried foods are harder to digest and may make you feel nauseous or bloated. Drink plenty of fluids but you should avoid alcoholic beverages for 24 hours. If you had a esophageal dilation, please see attached instructions for diet.    ACTIVITY: Your care partner should take you home directly after the procedure. You should plan to take it easy, moving slowly for the rest of the day. You can resume  normal activity the day after the procedure however YOU SHOULD NOT DRIVE, use power tools, machinery or perform tasks that involve climbing or major physical exertion for 24 hours (because of the sedation medicines used during the test).   SYMPTOMS TO REPORT IMMEDIATELY: A gastroenterologist can be reached at any hour. Please call [phone]  for any of the following symptoms:  Following lower endoscopy (colonoscopy, flexible sigmoidoscopy) Excessive amounts of blood in the stool  Significant tenderness, worsening of abdominal pains  Swelling of the abdomen that is new, acute  Fever of 100 or higher  Following upper endoscopy (EGD, EUS, ERCP, esophageal dilation) Vomiting of blood or coffee ground material  New, significant abdominal pain  New, significant chest pain or pain under the shoulder blades  Painful or persistently difficult swallowing  New shortness of breath  Black, tarry-looking or red, bloody stools  FOLLOW UP:  If any biopsies were taken you will be contacted by phone or by letter within the next 1-3 weeks. Call [phone]  if you have not heard about the biopsies in 3 weeks.  Please also call with any specific questions about appointments or follow up tests. 

## 2023-05-22 NOTE — Anesthesia Preprocedure Evaluation (Signed)
Anesthesia Evaluation  Patient identified by MRN, date of birth, ID band Patient awake    Reviewed: Allergy & Precautions, H&P , NPO status , Patient's Chart, lab work & pertinent test results  Airway Mallampati: II  TM Distance: <3 FB Neck ROM: Full    Dental no notable dental hx.    Pulmonary former smoker S/P lobectomy for cancer   Pulmonary exam normal breath sounds clear to auscultation       Cardiovascular hypertension, Pt. on medications Normal cardiovascular exam Rhythm:Regular Rate:Normal     Neuro/Psych negative neurological ROS  negative psych ROS   GI/Hepatic Neg liver ROS,GERD  Medicated,,  Endo/Other  negative endocrine ROS    Renal/GU negative Renal ROS  negative genitourinary   Musculoskeletal negative musculoskeletal ROS (+)    Abdominal   Peds negative pediatric ROS (+)  Hematology negative hematology ROS (+)   Anesthesia Other Findings   Reproductive/Obstetrics negative OB ROS                             Anesthesia Physical Anesthesia Plan  ASA: 3  Anesthesia Plan: MAC   Post-op Pain Management: Minimal or no pain anticipated   Induction: Intravenous  PONV Risk Score and Plan: 2 and Propofol infusion and Treatment may vary due to age or medical condition  Airway Management Planned: Simple Face Mask  Additional Equipment:   Intra-op Plan:   Post-operative Plan:   Informed Consent: I have reviewed the patients History and Physical, chart, labs and discussed the procedure including the risks, benefits and alternatives for the proposed anesthesia with the patient or authorized representative who has indicated his/her understanding and acceptance.     Dental advisory given  Plan Discussed with: CRNA and Surgeon  Anesthesia Plan Comments:        Anesthesia Quick Evaluation

## 2023-05-22 NOTE — Anesthesia Postprocedure Evaluation (Signed)
Anesthesia Post Note  Patient: [name]  Procedure(s) Performed: UPPER ENDOSCOPIC ULTRASOUND (EUS) RADIAL BIOPSY POLYPECTOMY FINE NEEDLE ASPIRATION     Patient location during evaluation: PACU Anesthesia Type: MAC Level of consciousness: awake and alert Pain management: pain level controlled Vital Signs Assessment: post-procedure vital signs reviewed and stable Respiratory status: spontaneous breathing, nonlabored ventilation, respiratory function stable and patient connected to nasal cannula oxygen Cardiovascular status: stable and blood pressure returned to baseline Postop Assessment: no apparent nausea or vomiting Anesthetic complications: no  No notable events documented.  Last Vitals:  Vitals:   05/22/23 1410 05/22/23 1420  BP:    Pulse: (!) 56 (!) 54  Resp: (!) 21 18  Temp:    SpO2: 98% 98%    Last Pain:  Vitals:   05/22/23 1420  TempSrc:   PainSc: 0-No pain                 [name] S

## 2023-05-22 NOTE — Transfer of Care (Signed)
Immediate Anesthesia Transfer of Care Note  Patient: [name]  Procedure(s) Performed: UPPER ENDOSCOPIC ULTRASOUND (EUS) RADIAL BIOPSY POLYPECTOMY FINE NEEDLE ASPIRATION  Patient Location: PACU  Anesthesia Type:MAC  Level of Consciousness: sedated, patient cooperative, and responds to stimulation  Airway & Oxygen Therapy: Patient Spontanous Breathing and Patient connected to face mask oxygen  Post-op Assessment: Report given to RN and Post -op Vital signs reviewed and stable  Post vital signs: Reviewed and stable  Last Vitals:  Vitals Value Taken Time  BP 111/47 05/22/23 1355  Temp 36.3 C 05/22/23 1355  Pulse 55 05/22/23 1356  Resp 18 05/22/23 1356  SpO2 100 % 05/22/23 1356  Vitals shown include unfiled device data.  Last Pain:  Vitals:   05/22/23 1355  TempSrc: Temporal  PainSc: 0-No pain         Complications: No notable events documented.

## 2023-05-22 NOTE — H&P (Signed)
GASTROENTEROLOGY PROCEDURE H&P NOTE   Primary Care Physician: Beam, Chales Salmon, MD  HPI: [name] is a 82 y.o. female who presents for EGD/EUS to evaluate a pancreatic lesion/mass to rule out malignancy.  Past Medical History:  Diagnosis Date   Anxiety    Blood glucose elevated 05/27/2012   pt. 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   Overview:  Dr Elnoria Howard  Last Assessment & Plan:  Relevant Hx: Course: Daily Update: Today's Plan:    GERD (gastroesophageal reflux disease)    Hemorrhoid 12/06/2015   History of hiatal hernia    HLD (hyperlipidemia) 12/03/2011   Hypertension    Lung cancer (HCC) dx'd 04/2018   Osteopenia 05/30/2015   Overview:  Bone density 05/2015 started fosamax    Primary localized osteoarthritis of left knee    Primary localized osteoarthritis of right knee 12/06/2015   Past Surgical History:  Procedure Laterality Date   ABDOMINAL HYSTERECTOMY     CATARACT EXTRACTION, BILATERAL     COLONOSCOPY     EYE SURGERY Bilateral    Cataract   implantable loop recorder placement  10/26/2018   MDT LINQ Reveal XT ILR implanted for evaluation of palpitations and atrial fibrillation in office by Dr Johney Frame, Device SN [id number])    KNEE SURGERY  05/01/2016   left uncompartmental    PARTIAL KNEE ARTHROPLASTY Right 12/18/2015   Procedure: UNICOMPARTMENTAL RIGHT KNEE;  Surgeon: Salvatore Marvel, MD;  [facility] OR;  Service: Orthopedics;  Laterality: Right;   PARTIAL KNEE ARTHROPLASTY Left 05/01/2016   Procedure: UNICOMPARTMENTAL KNEE;  Surgeon: Salvatore Marvel, MD;  Location: [facility] OR;  Service: Orthopedics;  Laterality: Left;   TOTAL ABDOMINAL HYSTERECTOMY W/  BILATERAL SALPINGOOPHORECTOMY     VIDEO ASSISTED THORACOSCOPY (VATS)/ LOBECTOMY Left 05/13/2018   Procedure: VIDEO ASSISTED THORACOSCOPY (VATS)/LEFT UPPER LOBECTOMY;  Surgeon: Loreli Slot, MD;  Location: [facility] OR;  Service: Thoracic;  Laterality: Left;   Current Facility-Administered Medications  Medication Dose Route Frequency Provider Last Rate Last Admin   0.9 %  sodium chloride infusion   Intravenous Continuous Mansouraty, Netty Starring., MD       lactated ringers infusion   Intravenous Continuous Mansouraty, Netty Starring., MD        Current Facility-Administered Medications:    0.9 %  sodium chloride infusion, , Intravenous, Continuous, Mansouraty, Netty Starring., MD   lactated ringers infusion, , Intravenous, Continuous, Mansouraty, Netty Starring., MD Allergies  Allergen Reactions   Ambien [Zolpidem Tartrate] Other (See Comments)    ARRYTHMIA   Sulfa Antibiotics Other (See Comments)    UNSPECIFIED, childhood reaction   Codeine Hives and Rash   Hydrocodone Other (See Comments)    FATIGUE   Meperidine Nausea And Vomiting   Family History  Problem Relation Age of Onset   Congestive Heart Failure Mother    Breast cancer Sister    Breast cancer Niece    Social History   Socioeconomic History   Marital status: Widowed    Spouse name: Not on file   Number of children: Not on file   Years of education: Not on file   Highest education level: Not on file  Occupational History   Not on file  Tobacco Use   Smoking status: Former    Current packs/day: 0.00    Types: Cigarettes    Start date: 02/09/1969    Quit date: 02/10/2019    Years since quitting: 4.2   Smokeless tobacco: Never   Tobacco comments:    only one cigarette occasionally  Vaping Use   Vaping status: Never Used  Substance and Sexual Activity   Alcohol use: Yes    Alcohol/week: 1.0 standard drink of alcohol    Types: 1 Glasses of wine per week    Comment: social rare   Drug use: No   Sexual activity: Not  Currently  Other Topics Concern   Not on file  Social History Narrative   Not on file   Social Determinants of Health   Financial Resource Strain: Low Risk  (04/07/2023)   Received from [facility]   Overall Financial Resource Strain (CARDIA)    Difficulty of Paying Living Expenses: Not hard at all  Food Insecurity: No Food Insecurity (04/07/2023)   Received from [facility]   Hunger Vital Sign    Worried About Running Out of Food in the Last Year: Never true    Ran Out of Food in the Last Year: Never true  Transportation Needs: No Transportation Needs (04/07/2023)   Received from [facility] - Transportation    Lack of Transportation (Medical): No    Lack of Transportation (Non-Medical): No  Physical Activity: Insufficiently Active (04/07/2023)   Received from [facility]   Exercise Vital Sign    Days of Exercise per Week: 1 day    Minutes of Exercise per Session: 20 min  Stress: No Stress Concern Present (04/07/2023)   Received from [facility] - Occupational Stress Questionnaire    Feeling of Stress : Only a little  Social Connections: Socially Integrated (04/07/2023)   Received from [facility]   Social Network    How would you rate your social network (family, work, friends)?: Good participation with social networks  Intimate Partner Violence: Not At Risk (04/07/2023)   Received from [facility]   HITS    Over the last 12 months how often did your partner physically hurt you?: 1    Over the last 12 months how often did your partner insult you or talk down to you?: 1    Over the last 12 months how often did your partner threaten you with physical harm?: 1    Over the last 12 months how often did your partner scream or curse at you?: 1    Physical Exam: Today's Vitals   05/16/23 1318  Weight: 64.6 kg   Body mass index is 26.05 kg/m. GEN: NAD EYE: Sclerae anicteric ENT: MMM CV: Non-tachycardic GI:  Soft, NT/ND NEURO:  Alert & Oriented x 3  Lab Results: No results for input(s): "WBC", "HGB", "HCT", "PLT" in the last 72 hours. BMET No results for input(s): "NA", "K", "CL", "CO2", "GLUCOSE", "BUN", "CREATININE", "CALCIUM" in the last 72 hours. LFT No results for input(s): "PROT", "ALBUMIN", "AST", "ALT", "ALKPHOS", "BILITOT", "BILIDIR", "IBILI" in the last 72 hours. PT/INR No results for input(s): "LABPROT", "INR" in the last 72 hours.   Impression / Plan: This is a 82 y.o.female who presents for EGD/EUS to evaluate a pancreatic lesion/mass to rule out malignancy.  The risks of an EUS including intestinal perforation, bleeding, infection, aspiration, and medication effects were discussed  as was the possibility it may not give a definitive diagnosis if a biopsy is performed.  When a biopsy of the pancreas is done as part of the EUS, there is an additional risk of pancreatitis at the rate of about 1-2%.  It was explained that procedure related pancreatitis is typically mild, although it can be severe and even life threatening, which is why we do not perform random pancreatic biopsies and only biopsy a lesion/area we feel is concerning enough to warrant the risk.   The risks and benefits of endoscopic evaluation/treatment were discussed with the patient and/or family; these include but are not limited to the risk of perforation, infection, bleeding, missed lesions, lack of diagnosis, severe illness requiring hospitalization, as well as anesthesia and sedation related illnesses.  The patient's history has been reviewed, patient examined, no change in status, and deemed stable for procedure.  The patient and/or family is agreeable to proceed.    Corliss Parish, MD [facility] Office # [phone]

## 2023-05-23 DIAGNOSIS — K8689 Other specified diseases of pancreas: Secondary | ICD-10-CM

## 2023-05-23 DIAGNOSIS — K317 Polyp of stomach and duodenum: Secondary | ICD-10-CM

## 2023-05-23 DIAGNOSIS — K2970 Gastritis, unspecified, without bleeding: Secondary | ICD-10-CM

## 2023-05-23 LAB — SURGICAL PATHOLOGY

## 2023-05-25 ENCOUNTER — Encounter: Payer: Self-pay | Admitting: Gastroenterology

## 2023-05-26 ENCOUNTER — Encounter (HOSPITAL_COMMUNITY): Payer: Self-pay | Admitting: Gastroenterology

## 2023-05-28 ENCOUNTER — Telehealth: Payer: Self-pay | Admitting: Internal Medicine

## 2023-05-28 NOTE — Telephone Encounter (Signed)
Scheduled per 09/17 scheduling message, patient has been called and notified.

## 2023-06-01 ENCOUNTER — Other Ambulatory Visit: Payer: Medicare Other

## 2023-06-02 ENCOUNTER — Other Ambulatory Visit: Payer: Self-pay

## 2023-06-02 DIAGNOSIS — C3492 Malignant neoplasm of unspecified part of left bronchus or lung: Secondary | ICD-10-CM

## 2023-06-03 ENCOUNTER — Inpatient Hospital Stay: Payer: Medicare Other | Attending: Internal Medicine

## 2023-06-03 ENCOUNTER — Inpatient Hospital Stay (HOSPITAL_BASED_OUTPATIENT_CLINIC_OR_DEPARTMENT_OTHER): Payer: Medicare Other | Admitting: Internal Medicine

## 2023-06-03 VITALS — BP 149/77 | HR 48 | Temp 98.1°F | Resp 16 | Ht 62.0 in | Wt 144.2 lb

## 2023-06-03 DIAGNOSIS — C250 Malignant neoplasm of head of pancreas: Secondary | ICD-10-CM | POA: Diagnosis present

## 2023-06-03 DIAGNOSIS — K8689 Other specified diseases of pancreas: Secondary | ICD-10-CM

## 2023-06-03 DIAGNOSIS — Z79899 Other long term (current) drug therapy: Secondary | ICD-10-CM | POA: Insufficient documentation

## 2023-06-03 DIAGNOSIS — Z85828 Personal history of other malignant neoplasm of skin: Secondary | ICD-10-CM | POA: Insufficient documentation

## 2023-06-03 DIAGNOSIS — C342 Malignant neoplasm of middle lobe, bronchus or lung: Secondary | ICD-10-CM | POA: Diagnosis not present

## 2023-06-03 DIAGNOSIS — C3492 Malignant neoplasm of unspecified part of left bronchus or lung: Secondary | ICD-10-CM

## 2023-06-03 DIAGNOSIS — Z87891 Personal history of nicotine dependence: Secondary | ICD-10-CM | POA: Diagnosis not present

## 2023-06-03 LAB — CBC WITH DIFFERENTIAL (CANCER CENTER ONLY)
Abs Immature Granulocytes: 0.04 10*3/uL (ref 0.00–0.07)
Basophils Absolute: 0 10*3/uL (ref 0.0–0.1)
Basophils Relative: 0 %
Eosinophils Absolute: 0.1 10*3/uL (ref 0.0–0.5)
Eosinophils Relative: 1 %
HCT: 36.8 % (ref 36.0–46.0)
Hemoglobin: 12.8 g/dL (ref 12.0–15.0)
Immature Granulocytes: 1 %
Lymphocytes Relative: 15 %
Lymphs Abs: 1.3 10*3/uL (ref 0.7–4.0)
MCH: 34 pg (ref 26.0–34.0)
MCHC: 34.8 g/dL (ref 30.0–36.0)
MCV: 97.9 fL (ref 80.0–100.0)
Monocytes Absolute: 0.5 10*3/uL (ref 0.1–1.0)
Monocytes Relative: 5 %
Neutro Abs: 6.6 10*3/uL (ref 1.7–7.7)
Neutrophils Relative %: 78 %
Platelet Count: 229 10*3/uL (ref 150–400)
RBC: 3.76 MIL/uL — ABNORMAL LOW (ref 3.87–5.11)
RDW: 12.2 % (ref 11.5–15.5)
WBC Count: 8.6 10*3/uL (ref 4.0–10.5)
nRBC: 0 % (ref 0.0–0.2)

## 2023-06-03 LAB — CMP (CANCER CENTER ONLY)
ALT: 9 U/L (ref 0–44)
AST: 17 U/L (ref 15–41)
Albumin: 4 g/dL (ref 3.5–5.0)
Alkaline Phosphatase: 62 U/L (ref 38–126)
Anion gap: 4 — ABNORMAL LOW (ref 5–15)
BUN: 17 mg/dL (ref 8–23)
CO2: 29 mmol/L (ref 22–32)
Calcium: 9.3 mg/dL (ref 8.9–10.3)
Chloride: 103 mmol/L (ref 98–111)
Creatinine: 0.95 mg/dL (ref 0.44–1.00)
GFR, Estimated: 59 mL/min — ABNORMAL LOW (ref >60)
Glucose, Bld: 115 mg/dL — ABNORMAL HIGH (ref 70–99)
Potassium: 4.6 mmol/L (ref 3.5–5.1)
Sodium: 136 mmol/L (ref 135–145)
Total Bilirubin: 0.5 mg/dL (ref 0.3–1.2)
Total Protein: 6.5 g/dL (ref 6.5–8.1)

## 2023-06-03 NOTE — Progress Notes (Signed)
[facility] [phone]   [phone]  OFFICE PROGRESS NOTE  Beam, Chales Salmon, MD [address] [place] [address]  DIAGNOSIS:  1) stage Ib (T2, N0, M0) pancreatic adenocarcinoma presented with mass in the pancreatic head diagnosed in September 2024. 2) suspicious right middle lobe hypermetabolic nodule concerning for metachronous lung cancer. 3) Stage IA (T1c, N0, M0) non-small cell lung cancer, adenocarcinoma with lymphovascular invasion    PRIOR THERAPY:  1) Status post left upper lobectomy with lymph node dissection in September 2019 under the care of Dr. Dorris Fetch 2) status post SBRT to the right middle lobe metachronous lung cancer and or the care of Dr. Roselind Messier CURRENT THERAPY: Observation.  INTERVAL HISTORY: [name] 82 y.o. female returns to the clinic today for follow-up visit accompanied by her son.Discussed the use of AI scribe software for clinical note transcription with the patient, who gave verbal consent to proceed.  History of Present Illness   The patient, an 82 year old white female, accompanied by her youngest son, presents with a recent diagnosis of pancreatic adenocarcinoma. The diagnosis was confirmed through an MRI of the abdomen, endoscopic ultrasound, and biopsy following a concerning PET scan. The mass, located in the head of the pancreas, is less than 4 cm, and no evidence of metastasis or invasion of surrounding blood vessels was noted.  In addition to the pancreatic cancer, the patient has nodules in the right middle and lower lobes of the lung, for which she has not yet received radiation treatment. The patient expressed a desire to establish a treatment plan as soon as possible, given that it has been three months since the initial discovery of the lung nodules.  The patient reports feeling well overall, with no complaints of nausea, vomiting, diarrhea, constipation, chest pain, or breathing  issues. She has not experienced any unintentional weight loss, and in fact, have gained a small amount of weight, which they attribute to quitting smoking approximately two months ago. The patient reports no abdominal or back pain, which can be common symptoms associated with pancreatic cancer.  The patient expressed some concern about potential surgical intervention due to their age and the seriousness of the procedure. She mentioned a family member who had undergone a Whipple procedure and had a lengthy hospital stay. The patient is open to discussing all treatment options, including surgery and radiation, with specialists.       MEDICAL HISTORY: Past Medical History:  Diagnosis Date   Anxiety    Blood glucose elevated 05/27/2012   pt. 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   Overview:  Dr Elnoria Howard  Last Assessment & Plan:  Relevant Hx: Course: Daily Update: Today's Plan:    GERD (gastroesophageal reflux disease)    Hemorrhoid 12/06/2015   History of hiatal hernia    HLD (hyperlipidemia) 12/03/2011   Hypertension    Lung cancer (HCC) dx'd 04/2018   Osteopenia 05/30/2015   Overview:  Bone density 05/2015 started fosamax    Primary localized osteoarthritis of left knee    Primary localized osteoarthritis of right knee 12/06/2015    ALLERGIES:  is  allergic to [employer] tartrate], sulfa antibiotics, codeine, hydrocodone, and meperidine.  MEDICATIONS:  Current Outpatient Medications  Medication Sig Dispense Refill   aspirin EC 81 MG tablet Take 1 tablet (81 mg total) by mouth daily. Swallow whole. 90 tablet 3   Calcium Carb-Cholecalciferol (CALCIUM-VITAMIN D3) 600-200 MG-UNIT TABS  Take by mouth daily.     cetirizine (ZYRTEC) 10 MG tablet Take 10 mg by mouth as needed for allergies.     diphenhydramine-acetaminophen (TYLENOL PM) 25-500 MG TABS tablet Take 2 tablets by mouth at bedtime as needed (for sleep).     escitalopram (LEXAPRO) 20 MG tablet Take by mouth daily.     metoprolol succinate (TOPROL-XL) 50 MG 24 hr tablet Take 50 mg by mouth daily. Take with or immediately following a meal.     Multiple Vitamins-Minerals (MULTIVITAMIN PO) Take 1 tablet by mouth daily.     pantoprazole (PROTONIX) 40 MG tablet Take 40 mg by mouth daily.     polyethylene glycol (MIRALAX / GLYCOLAX) packet Take 8.5 g by mouth every other day.     simvastatin (ZOCOR) 20 MG tablet Take 20 mg by mouth daily.     No current facility-administered medications for this visit.    SURGICAL HISTORY:  Past Surgical History:  Procedure Laterality Date   ABDOMINAL HYSTERECTOMY     BIOPSY  05/22/2023   Procedure: BIOPSY;  Surgeon: Meridee Score Netty Starring., MD;  Location: [facility];  Service: Gastroenterology;;   CATARACT EXTRACTION, BILATERAL     COLONOSCOPY     ESOPHAGOGASTRODUODENOSCOPY (EGD) WITH PROPOFOL N/A 05/22/2023   Procedure: ESOPHAGOGASTRODUODENOSCOPY (EGD) WITH PROPOFOL;  Surgeon: Lemar Lofty., MD;  Location: [facility];  Service: Gastroenterology;  Laterality: N/A;   EUS N/A 05/22/2023   Procedure: UPPER ENDOSCOPIC ULTRASOUND (EUS) RADIAL;  Surgeon: Lemar Lofty., MD;  Location: [facility];  Service: Gastroenterology;  Laterality: N/A;   EYE SURGERY Bilateral    Cataract   FINE NEEDLE ASPIRATION  05/22/2023   Procedure: FINE NEEDLE ASPIRATION;  Surgeon: Lemar Lofty., MD;  Location: [facility];  Service: Gastroenterology;;   implantable loop recorder placement  10/26/2018   MDT LINQ Reveal XT ILR implanted for evaluation of palpitations and atrial fibrillation in office by Dr Johney Frame, Device SN [id number])    KNEE SURGERY  05/01/2016   left  uncompartmental    PARTIAL KNEE ARTHROPLASTY Right 12/18/2015   Procedure: UNICOMPARTMENTAL RIGHT KNEE;  Surgeon: Salvatore Marvel, MD;  Location: [facility] OR;  Service: Orthopedics;  Laterality: Right;   PARTIAL KNEE ARTHROPLASTY Left 05/01/2016   Procedure: UNICOMPARTMENTAL KNEE;  Surgeon: Salvatore Marvel, MD;  Location: [facility] OR;  Service: Orthopedics;  Laterality: Left;   POLYPECTOMY  05/22/2023   Procedure: POLYPECTOMY;  Surgeon: Mansouraty, Netty Starring., MD;  Location: [facility];  Service: Gastroenterology;;   TOTAL ABDOMINAL HYSTERECTOMY W/ BILATERAL SALPINGOOPHORECTOMY     VIDEO ASSISTED THORACOSCOPY (VATS)/ LOBECTOMY Left 05/13/2018   Procedure: VIDEO ASSISTED THORACOSCOPY (VATS)/LEFT UPPER LOBECTOMY;  Surgeon: Loreli Slot, MD;  Location: [facility] OR;  Service: Thoracic;  Laterality: Left;    REVIEW OF SYSTEMS:  Constitutional: positive for fatigue Eyes: negative Ears, nose, mouth, throat, and face: negative Respiratory: negative Cardiovascular: negative Gastrointestinal: negative Genitourinary:negative Integument/breast: negative Hematologic/lymphatic: negative Musculoskeletal:negative Neurological: negative Behavioral/Psych: negative Endocrine: negative Allergic/Immunologic: negative   PHYSICAL EXAMINATION: General appearance: alert, cooperative, fatigued, and no distress Head: Normocephalic, without obvious abnormality, atraumatic Neck: no adenopathy, no JVD, supple, symmetrical, trachea midline, and thyroid not enlarged, symmetric, no tenderness/mass/nodules Lymph nodes: Cervical, supraclavicular, and  axillary nodes normal. Resp: clear to auscultation bilaterally Back: symmetric, no curvature. ROM normal. No CVA tenderness. Cardio: regular rate and rhythm, S1, S2 normal, no murmur, click, rub or gallop GI: soft, non-tender; bowel sounds normal; no masses,  no organomegaly Extremities: extremities normal, atraumatic, no cyanosis or edema Neurologic: Alert and oriented X 3, normal  strength and tone. Normal symmetric reflexes. Normal coordination and gait  ECOG PERFORMANCE STATUS: 1 - Symptomatic but completely ambulatory  Blood pressure (!) 149/77, pulse (!) 48, temperature 98.1 F (36.7 C), temperature source Oral, resp. rate 16, height 5\' 2"  (1.575 m), weight 144 lb 3 oz (65.4 kg), SpO2 99%.  LABORATORY DATA: Lab Results  Component Value Date   WBC 8.6 06/03/2023   HGB 12.8 06/03/2023   HCT 36.8 06/03/2023   MCV 97.9 06/03/2023   PLT 229 06/03/2023      Chemistry      Component Value Date/Time   NA 135 05/16/2023 1411   K 3.8 05/16/2023 1411   CL 99 05/16/2023 1411   CO2 29 05/16/2023 1411   BUN 14 05/16/2023 1411   CREATININE 0.79 05/16/2023 1411   CREATININE 0.87 03/03/2023 1048      Component Value Date/Time   CALCIUM 9.5 05/16/2023 1411   ALKPHOS 66 05/16/2023 1411   AST 15 05/16/2023 1411   AST 17 03/03/2023 1048   ALT 9 05/16/2023 1411   ALT 8 03/03/2023 1048   BILITOT 0.7 05/16/2023 1411   BILITOT 0.7 03/03/2023 1048       RADIOGRAPHIC STUDIES: MR Brain W Wo Contrast  Result Date: 05/15/2023 CLINICAL DATA:  Metastatic disease evaluation EXAM: MRI HEAD WITHOUT AND WITH CONTRAST TECHNIQUE: Multiplanar, multiecho pulse sequences of the brain and surrounding structures were obtained without and with intravenous contrast. CONTRAST:  7mL GADAVIST GADOBUTROL 1 MMOL/ML IV SOLN COMPARISON:  None Available. FINDINGS: Brain: Negative for an acute infarct. No hemorrhage. No hydrocephalus. No extra-axial fluid collection. No contrast enhancing lesion. No mass effect. No mass lesion. Sequela of mild chronic microvascular ischemic change. Vascular: Normal flow voids. Skull and upper cervical spine: Normal marrow signal. Sinuses/Orbits: No middle ear or mastoid effusion. Paranasal sinuses are clear. Bilateral lens replacement. Orbits are otherwise unremarkable. Other: None. IMPRESSION: No evidence of intracranial metastatic disease. Electronically Signed    By: Lorenza Cambridge M.D.   On: 05/15/2023 11:06   MR 3D Recon At Scanner  Result Date: 05/05/2023 : CLINICAL DATA: Non-small cell lung cancer with increased tracer uptake identified within the pancreatic head. EXAM: MRI ABDOMEN WITHOUT AND WITH CONTRAST TECHNIQUE: Multiplanar multisequence MR imaging of the abdomen was performed both before and after the administration of intravenous contrast. CONTRAST: 7mL GADAVIST GADOBUTROL 1 MMOL/ML IV SOLN COMPARISON: PET-CT from 03/28/2023 . FINDINGS: Lower chest: The known tracer avid right middle lobe lung nodule is again identified measuring approximately 1.7 cm, image 2/5. On the previous PET-CT this measured the same. No pleural effusion Hepatobiliary: There are scattered T2 hyperintense and T1 hypointense foci within the liver. The largest measures 0.8 cm within the posterolateral right lobe, image 15/4. When compared with remote CT with contrast from 02/01/2017 these appears similar in multiplicity and location favoring a benign process such as multiple cysts. Unfortunately, motion artifact diminishes exam detail within the liver for the evaluation underlying enhancing liver lesions. No obvious suspicious lesion identified at this time. Pancreas: The gallbladder appears normal. No significant bile duct dilatation. Spleen: There is diffuse common bile duct dilatation involving the head, neck, body and tail  of pancreas. Suspicious lesion within the head of pancreas at the cut off of the dilated pancreatic duct measures 3.0 x 2 point for by 2.4 cm, image 22/5 and image 32/17. This corresponds to the area of increased tracer uptake noted on the recent PET-CT. Lesion and remainder of pancreas are largely of obscured on the postcontrast images. Motion artifact diminishes evaluation of this liver lesion with relationship to the portal vein. The celiac trunk and SMA do not appear involved. Adrenals/Urinary Tract: Normal adrenal glands. No hydronephrosis. Multiple left kidney  parapelvic cysts. Cortical cyst within the lateral aspect of the right kidney measures 2.4 cm, image 24/4. No follow-up imaging recommended. Stomach/Bowel: Stomach appears nondistended. No dilated loops of bowel. Vascular/Lymphatic: Aortic atherosclerosis. No enlarged upper abdominal lymph nodes identified. Other: None. Musculoskeletal: No suspicious bone lesions identified. IMPRESSION: 1. Suspicious, solid lesion within the head of pancreas 3.0 x 2.4 x 2.4 cm. There is associated main duct dilatation within the head through tail of pancreas. Suspected lesion corresponds to the area of increased tracer uptake noted on the recent PET-CT. Findings are concerning for pancreatic adenocarcinoma. Motion artifact on the postcontrast images diminishes exam detail within the pancreas. Difficult to exclude involvement of the portal vein. The celiac trunk and proximal SMA appear uninvolved. Consider further evaluation with endoscopic ultrasound and possibly CTA of the abdomen. 2. Scattered T2 hyperintense and T1 hypointense foci within the liver are favored to represent multiple cysts. Unfortunately, motion artifact diminishes exam detail within the liver for the evaluation underlying enhancing liver lesions. Within this limitation, no obvious suspicious lesion identified at this time. 3. Right middle lobe lung nodule is again identified as seen on the previous PET-CT. Electronically Signed   By: Signa Kell M.D.   On: 05/05/2023 07:24   MR Abdomen W Wo Contrast  Result Date: 05/04/2023 CLINICAL DATA:  Non-small cell lung cancer with increased tracer uptake identified within the pancreatic head. EXAM: MRI ABDOMEN WITHOUT AND WITH CONTRAST TECHNIQUE: Multiplanar multisequence MR imaging of the abdomen was performed both before and after the administration of intravenous contrast. CONTRAST:  7mL GADAVIST GADOBUTROL 1 MMOL/ML IV SOLN COMPARISON:  PET-CT from 03/28/2023 . FINDINGS: Lower chest: The known tracer avid right  middle lobe lung nodule is again identified measuring approximately 1.7 cm, image 2/5. On the previous PET-CT this measured the same. No pleural effusion Hepatobiliary: There are scattered T2 hyperintense and T1 hypointense foci within the liver. The largest measures 0.8 cm within the posterolateral right lobe, image 15/4. When compared with remote CT with contrast from 02/01/2017 these appears similar in multiplicity and location favoring a benign process such as multiple cysts. Unfortunately, motion artifact diminishes exam detail within the liver for the evaluation underlying enhancing liver lesions. No obvious suspicious lesion identified at this time. Pancreas: The gallbladder appears normal. No significant bile duct dilatation. Spleen: There is diffuse common bile duct dilatation involving the head, neck, body and tail of pancreas. Suspicious lesion within the head of pancreas at the cut off of the dilated pancreatic duct measures 3.0 x 2 point for by 2.4 cm, image 22/5 and image 32/17. This corresponds to the area of increased tracer uptake noted on the recent PET-CT. Lesion and remainder of pancreas are largely of obscured on the postcontrast images. Motion artifact diminishes evaluation of this liver lesion with relationship to the portal vein. The celiac trunk and SMA do not appear involved. Adrenals/Urinary Tract: Normal adrenal glands. No hydronephrosis. Multiple left kidney parapelvic cysts. Cortical cyst within  the lateral aspect of the right kidney measures 2.4 cm, image 24/4. No follow-up imaging recommended. Stomach/Bowel: Stomach appears nondistended. No dilated loops of bowel. Vascular/Lymphatic: Aortic atherosclerosis. No enlarged upper abdominal lymph nodes identified. Other:  None. Musculoskeletal: No suspicious bone lesions identified. IMPRESSION: 1. Suspicious, solid lesion within the head of pancreas 3.0 x 2.4 x 2.4 cm. There is associated main duct dilatation within the head through tail of  pancreas. Suspected lesion corresponds to the area of increased tracer uptake noted on the recent PET-CT. Findings are concerning for pancreatic adenocarcinoma. Motion artifact on the postcontrast images diminishes exam detail within the pancreas. Difficult to exclude involvement of the portal vein. The celiac trunk and proximal SMA appear uninvolved. Consider further evaluation with endoscopic ultrasound and possibly CTA of the abdomen. 2. Scattered T2 hyperintense and T1 hypointense foci within the liver are favored to represent multiple cysts. Unfortunately, motion artifact diminishes exam detail within the liver for the evaluation underlying enhancing liver lesions. Within this limitation, no obvious suspicious lesion identified at this time. 3. Right middle lobe lung nodule is again identified as seen on the previous PET-CT. Electronically Signed   By: Signa Kell M.D.   On: 05/04/2023 13:22     ASSESSMENT AND PLAN:  This is a very pleasant 82 years old white female with stage IA (T1c, N0, M0) non-small cell lung cancer, adenocarcinoma with lymphovascular invasion status post left upper lobectomy with lymph node dissection in September 2019 under the care of Dr. Dorris Fetch The patient has been on observation but on a recent CT scan of the chest she was found to have enlarging right middle lobe lung nodule. A PET scan was performed and it showed 1.2 cm medial right middle lobe pulmonary nodule that is hypermetabolic and consistent with primary neoplasm.  There was 0.7 cm right lower lobe nodule and 0.7 right upper lobe pulmonary nodule that did not demonstrate any hypermetabolism but still require close surveillance.  There was no mediastinal or hilar lymphadenopathy and no finding of distant metastatic disease or osseous metastasis. She was referred to Dr. Roselind Messier for SBRT to the right middle lobe lung nodule followed by close monitoring of the other small nodule in the right upper and right lower  lobes.    Pancreatic Adenocarcinoma, Stage IB (T2, N0, M0) diagnosed in September of 2024 Diagnosed via endoscopic ultrasound and biopsy. Tumor is less than 4 cm and localized to the head of the pancreas. No evidence of metastasis or invasion of blood vessels. Discussed the potential for surgical resection (Whipple procedure) vs radiation and chemotherapy. Patient's age and overall health are considerations. -Refer to surgical consultation for evaluation of resectability. -Refer to GI Oncology specialist for further management.  Lung Nodules Small nodules identified in the right middle and lower lobes. No radiation treatment received yet. -Continue follow-up with Dr. Roselind Messier for management of lung nodules.  Smoking Cessation Patient quit smoking approximately two months ago. -Encourage continued smoking cessation.  Follow-up If no contact from referrals within a week, patient to call office.   The patient was advised to call immediately if she has any concerning symptoms in the interval. The patient voices understanding of current disease status and treatment options and is in agreement with the current care plan.  All questions were answered. The patient knows to call the clinic with any problems, questions or concerns. We can certainly see the patient much sooner if necessary. The total time spent in the appointment was 30 minutes.  Disclaimer: This  note was dictated with voice recognition software. Similar sounding words can inadvertently be transcribed and may not be corrected upon review.

## 2023-06-04 ENCOUNTER — Other Ambulatory Visit: Payer: Self-pay | Admitting: Genetic Counselor

## 2023-06-04 DIAGNOSIS — Z1379 Encounter for other screening for genetic and chromosomal anomalies: Secondary | ICD-10-CM

## 2023-06-04 NOTE — Progress Notes (Signed)
I spoke with [name].  She is agreeable to consultation with Crystal Glad, NP and [name] on 06/09/2023 at 1030.  I asked that she arrive by 1015 for registration purposes.  She is aware of our location. All questions were answered.  She verbalized understanding.

## 2023-06-08 NOTE — Progress Notes (Unsigned)
[facility]   [phone] Fax:(336) [phone]   Clinic New Consult Note   Patient Care Team: Beam, Chales Salmon, MD as PCP - General (Family Medicine) Corky Crafts, MD as PCP - Cardiology (Cardiology) Loreli Slot, MD as Consulting Physician (Cardiothoracic Surgery) Malachy Mood, MD as Consulting Physician (Oncology) Pollyann Samples, NP as Nurse Practitioner (Nurse Practitioner) Antony Blackbird, MD as Consulting Physician (Radiation Oncology) 06/09/2023  CHIEF COMPLAINTS/PURPOSE OF CONSULTATION:  Pancreatic cancer, referred by Dr. Arbutus Ped  SUMMARY OF ONCOLOGY HISTORY Oncology History  Adenocarcinoma of left lung, stage 1 (HCC)  07/06/2018 Initial Diagnosis   Adenocarcinoma of left lung, stage 1 (HCC)   02/15/2021 Cancer Staging   Staging form: Lung, AJCC 8th Edition - Clinical: Stage IA3 (cT1c, cN0, cM0) - Signed by Si Gaul, MD on 02/15/2021   Adenocarcinoma of head of pancreas (HCC)  03/03/2023 Imaging   CT chest IMPRESSION: 1. Enlarging and concerning right lung nodules, the largest in the right middle lobe measuring up to 1.4 cm in diameter, highly suspicious for primary bronchogenic carcinoma. The right middle lobe nodule is large enough to evaluate by PET-CT which is recommended. 2. No evidence of thoracic adenopathy or pleural effusion. No suspicious left lung nodule status post upper lobe resection. 3. Aortic Atherosclerosis (ICD10-I70.0) and Emphysema (ICD10-J43.9).   03/28/2023 PET scan   IMPRESSION: 1. 12 mm medial right middle lobe pulmonary nodule is hypermetabolic and consistent with primary neoplasm. 2. 7 mm right lower lobe nodule and 7 mm right upper lobe nodule do not demonstrate any hypermetabolism but will need close surveillance. 3. No mediastinal or hilar lymphadenopathy. 4. No findings for osseous metastatic disease. 5. Abnormal appearance of the pancreatic head and mild hypermetabolism worrisome for neoplasm.  Recommend MRI abdomen without and with contrast for further evaluation. Aortic Atherosclerosis (ICD10-I70.0).   05/04/2023 Imaging   ABD MRI IMPRESSION: 1. Suspicious, solid lesion within the head of pancreas 3.0 x 2.4 x 2.4 cm. There is associated main duct dilatation within the head through tail of pancreas. Suspected lesion corresponds to the area of increased tracer uptake noted on the recent PET-CT. Findings are concerning for pancreatic adenocarcinoma. Motion artifact on the postcontrast images diminishes exam detail within the pancreas. Difficult to exclude involvement of the portal vein. The celiac trunk and proximal SMA appear uninvolved. Consider further evaluation with endoscopic ultrasound and possibly CTA of the abdomen. 2. Scattered T2 hyperintense and T1 hypointense foci within the liver are favored to represent multiple cysts. Unfortunately, motion artifact diminishes exam detail within the liver for the evaluation underlying enhancing liver lesions. Within this limitation, no obvious suspicious lesion identified at this time. 3. Right middle lobe lung nodule is again identified as seen on the previous PET-CT.   05/04/2023 Imaging   Brain MRI IMPRESSION: No evidence of intracranial metastatic disease.   05/16/2023 Tumor Marker   CA 19-9: 256   05/22/2023 Cancer Staging   Staging form: Exocrine Pancreas, AJCC 8th Edition - Clinical stage from 05/22/2023: Stage IB (cT2, cN0, cM0) - Signed by Pollyann Samples, NP on 06/09/2023 Stage prefix: Initial diagnosis Total positive nodes: 0   05/22/2023 Procedure   EUS by Dr. Meridee Score: 29 x 28 mm mass in the pancreatic head appearing suspicious for adeno, no malignant appearing lymph nodes were visualized.  Abuts the gastroduodenal artery but not involved with the SMA or celiac trunk.  T2 N0 by EUS    05/22/2023 Initial Biopsy    A. PANCREAS, HEAD LESION, FINE  NEEDLE ASPIRATION:  FINAL MICROSCOPIC DIAGNOSIS:  - Adenocarcinoma     06/09/2023 Initial Diagnosis   Adenocarcinoma of head of pancreas (HCC)      HISTORY OF PRESENTING ILLNESS:  [name] 82 y.o. female with PMH including HTN, HL, OA, left lung cancer in 2019 (pT1c NSCLC s/p resection) is here because of newly diagnosed pancreatic cancer. Lung cancer surveillance CT chest 03/03/23 showed new/enlarging right lung nodules concerning for primary bronchogenic carcinoma. A follow up PET scan 03/28/23 showed one of the pulmonary nodules to be hypermetabolic (right middle lobe), the others were not, and an incidental abnormal appearance of the pancreatic head with mild hypermetabolism. There was no evidence of metastatic disease. Dedicated MRI 05/04/23 showed a 3. 0 x 2.4 x 2.4 cm solid mass in the head of pancreas with main duct dilatation corresponding to the PET findings, possible involvement of the portal vein but the celiac trunk and SMA appeared uninvolved. There were numerous liver lesions felt to reflect benign cysts. A brain MRI for staging was negative. She has been referred to Dr. Roselind Messier for SBRT to the right middle lung lesion. For further work up of the pancreatic mass, she underwent EUS by Dr. Meridee Score 05/22/23 showing a 29 x28 mm pancreatic head mass which was felt to abut the gastroduodenal artery but was uninvolved with the SMA. No malignant appearing lymph nodes or hepatic masses were seen. This was staged cT2N0 by EUS. FNA confirmed adenocarcinoma. CA 19-9 is elevated at 256.    Socially, she is widowed with 2 healthy adult sons. 1 had basal cell removed from his nose. Family history significant for father with prostate cancer. Pt is up to date on her other cancer screenings. Quit smoking 1 month ago.  Independent with ADLs and active, does tai chi from home.  Today she presents with one of her sons.  She feels well, but anxious to get cancer treatment started.  She quit smoking a month ago, lost weight initially but has gained it back.  Denies  significant fatigue.  She has some "burning in the stomach" from gastritis and the stress of her new diagnosis, PPI helps. She has chronic mild cough from emphysema. Not on oxygen.  Denies nausea/vomiting, constipation, diarrhea, back pain, leg edema, chest pain, dyspnea.    MEDICAL HISTORY:  Past Medical History:  Diagnosis Date   Anxiety    Blood glucose elevated 05/27/2012   pt. 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   Overview:  Dr Elnoria Howard  Last Assessment & Plan:  Relevant Hx: Course: Daily Update: Today's Plan:    GERD (gastroesophageal reflux disease)    Hemorrhoid 12/06/2015   History of hiatal hernia    HLD (hyperlipidemia) 12/03/2011   Hypertension    Lung cancer (HCC) dx'd 04/2018   Osteopenia 05/30/2015   Overview:  Bone density 05/2015 started fosamax    Primary localized osteoarthritis of left knee    Primary localized osteoarthritis of right knee 12/06/2015    SURGICAL HISTORY: Past Surgical History:  Procedure Laterality Date   ABDOMINAL HYSTERECTOMY     BIOPSY  05/22/2023   Procedure: BIOPSY;  Surgeon: Lemar Lofty.,  MD;  Location: [facility];  Service: Gastroenterology;;   CATARACT EXTRACTION, BILATERAL     COLONOSCOPY     ESOPHAGOGASTRODUODENOSCOPY (EGD) WITH PROPOFOL N/A 05/22/2023   Procedure: ESOPHAGOGASTRODUODENOSCOPY (EGD) WITH PROPOFOL;  Surgeon: Meridee Score Netty Starring., MD;  Location: [facility];  Service: Gastroenterology;  Laterality: N/A;   EUS N/A 05/22/2023   Procedure: UPPER ENDOSCOPIC ULTRASOUND (EUS) RADIAL;  Surgeon: Lemar Lofty., MD;  Location: [facility];  Service: Gastroenterology;  Laterality: N/A;   EYE SURGERY  Bilateral    Cataract   FINE NEEDLE ASPIRATION  05/22/2023   Procedure: FINE NEEDLE ASPIRATION;  Surgeon: Lemar Lofty., MD;  Location: [facility];  Service: Gastroenterology;;   implantable loop recorder placement  10/26/2018   MDT LINQ Reveal XT ILR implanted for evaluation of palpitations and atrial fibrillation in office by Dr Johney Frame, Device SN [id number])    KNEE SURGERY  05/01/2016   left uncompartmental    PARTIAL KNEE ARTHROPLASTY Right 12/18/2015   Procedure: UNICOMPARTMENTAL RIGHT KNEE;  Surgeon: Salvatore Marvel, MD;  [facility];  Service: Orthopedics;  Laterality: Right;   PARTIAL KNEE ARTHROPLASTY Left 05/01/2016   Procedure: UNICOMPARTMENTAL KNEE;  Surgeon: Salvatore Marvel, MD;  Location: [facility];  Service: Orthopedics;  Laterality: Left;   POLYPECTOMY  05/22/2023   Procedure: POLYPECTOMY;  Surgeon: Mansouraty, Netty Starring., MD;  Location: [facility];  Service: Gastroenterology;;   TOTAL ABDOMINAL HYSTERECTOMY W/ BILATERAL SALPINGOOPHORECTOMY     VIDEO ASSISTED THORACOSCOPY (VATS)/ LOBECTOMY Left 05/13/2018   Procedure: VIDEO ASSISTED THORACOSCOPY (VATS)/LEFT UPPER LOBECTOMY;  Surgeon: Loreli Slot, MD;  Location: [facility] OR;  Service: Thoracic;  Laterality: Left;    SOCIAL HISTORY: Social History   Socioeconomic History   Marital status: Widowed    Spouse name: Not on file   Number of children: Not on file   Years of education: Not on file   Highest education level: Not on file  Occupational History   Not on file  Tobacco Use   Smoking status: Former    Current packs/day: 0.00    Types: Cigarettes    Start date: 02/09/1969    Quit date: 02/10/2019    Years since quitting: 4.3   Smokeless tobacco: Never   Tobacco comments:    only one cigarette occasionally  Vaping Use   Vaping status: Never Used  Substance and Sexual Activity   Alcohol use: Yes    Alcohol/week: 1.0 standard drink of alcohol    Types: 1 Glasses of wine per week    Comment: social rare    Drug use: No   Sexual activity: Not Currently  Other Topics Concern   Not on file  Social History Narrative   Not on file   Social Determinants of Health   Financial Resource Strain: Low Risk  (06/02/2023)   Received from [facility]   Overall Financial Resource Strain (CARDIA)    Difficulty of Paying Living Expenses: Not very hard  Food Insecurity: No Food Insecurity (06/02/2023)   Received from [facility]   Hunger Vital Sign    Worried About Running Out of Food in the Last Year: Never true    Ran Out of Food in the Last Year: Never true  Transportation Needs: No Transportation Needs (06/02/2023)   Received from [facility] - Transportation    Lack of Transportation (Medical): No    Lack of Transportation (Non-Medical): No  Physical Activity: Insufficiently Active (06/02/2023)   Received from [facility]   Exercise Vital Sign  Days of Exercise per Week: 3 days    Minutes of Exercise per Session: 30 min  Stress: Stress Concern Present (06/02/2023)   Received from [facility] - Occupational Stress Questionnaire    Feeling of Stress : Rather much  Social Connections: Socially Integrated (06/02/2023)   Received from [facility]   Social Network    How would you rate your social network (family, work, friends)?: Good participation with social networks  Intimate Partner Violence: Not At Risk (06/02/2023)   Received from [facility]   HITS    Over the last 12 months how often did your partner physically hurt you?: 1    Over the last 12 months how often did your partner insult you or talk down to you?: 1    Over the last 12 months how often did your partner threaten you with physical harm?: 1    Over the last 12 months how often did your partner scream or curse at you?: 1    FAMILY HISTORY: Family History  Problem Relation Age of Onset   Congestive Heart Failure Mother    Breast cancer Sister    Breast cancer  Niece     ALLERGIES:  is allergic to [employer] tartrate], sulfa antibiotics, codeine, hydrocodone, and meperidine.  MEDICATIONS:  Current Outpatient Medications  Medication Sig Dispense Refill   Calcium Carb-Cholecalciferol (CALCIUM-VITAMIN D3) 600-200 MG-UNIT TABS Take by mouth daily.     cetirizine (ZYRTEC) 10 MG tablet Take 10 mg by mouth as needed for allergies.     diphenhydramine-acetaminophen (TYLENOL PM) 25-500 MG TABS tablet Take 2 tablets by mouth at bedtime as needed (for sleep).     escitalopram (LEXAPRO) 20 MG tablet Take by mouth daily.     metoprolol succinate (TOPROL-XL) 50 MG 24 hr tablet Take 50 mg by mouth daily. Take with or immediately following a meal.     Multiple Vitamins-Minerals (MULTIVITAMIN PO) Take 1 tablet by mouth daily.     pantoprazole (PROTONIX) 40 MG tablet Take 40 mg by mouth daily.     polyethylene glycol (MIRALAX / GLYCOLAX) packet Take 8.5 g by mouth every other day.     simvastatin (ZOCOR) 20 MG tablet Take 20 mg by mouth daily.     aspirin EC 81 MG tablet Take 1 tablet (81 mg total) by mouth daily. Swallow whole. (Patient not taking: Reported on 06/09/2023) 90 tablet 3   No current facility-administered medications for this visit.    REVIEW OF SYSTEMS:   Constitutional: Denies fevers, chills or abnormal night sweats (+) mild weight fluctuation from quitting smoking Eyes: Denies blurriness of vision, double vision or watery eyes Ears, nose, mouth, throat, and face: Denies mucositis or sore throat Respiratory: Denies  dyspnea or wheezes (+) cough/emphysema Cardiovascular: Denies palpitation, chest discomfort or lower extremity swelling Gastrointestinal:  Denies nausea, vomiting, constipation, diarrhea, pain, or change in bowel habits (+) GERD Skin: Denies abnormal skin rashes Lymphatics: Denies new lymphadenopathy or easy bruising Neurological:Denies numbness, tingling or new weaknesses Behavioral/Psych: (+) depression, controlled (+)  Mood  is stable, no new changes  All other systems were reviewed with the patient and are negative.  PHYSICAL EXAMINATION: ECOG PERFORMANCE STATUS: 0 - Asymptomatic  Vitals:   06/09/23 1024  BP: (!) 156/72  Pulse: (!) 58  Resp: 16  Temp: 97.7 F (36.5 C)  SpO2: 100%   Filed Weights   06/09/23 1024  Weight: 144 lb 11.2 oz (65.6 kg)  GENERAL:alert, no distress and comfortable SKIN: no rashes or significant lesions EYES: sclera clear NECK: Without mass LYMPH:  no palpable cervical or supraclavicular lymphadenopathy in LUNGS: clear to auscultation, with normal breathing effort HEART: regular rate & rhythm, no lower extremity edema ABDOMEN:abdomen soft, non-tender and normal bowel sounds Musculoskeletal:no cyanosis of digits and no clubbing  PSYCH: alert & oriented x 3 with fluent speech NEURO: no focal motor/sensory deficits  LABORATORY DATA:  I have reviewed the data as listed    Latest Ref Rng & Units 06/03/2023   11:18 AM 03/03/2023   10:48 AM 03/04/2022    2:00 PM  CBC  WBC 4.0 - 10.5 K/uL 8.6  7.5  8.7   Hemoglobin 12.0 - 15.0 g/dL 86.5  78.4  69.6   Hematocrit 36.0 - 46.0 % 36.8  41.4  41.6   Platelets 150 - 400 K/uL 229  197  198       Latest Ref Rng & Units 06/03/2023   11:18 AM 05/16/2023    2:11 PM 03/03/2023   10:48 AM  CMP  Glucose 70 - 99 mg/dL 295  284  132   BUN 8 - 23 mg/dL 17  14  16    Creatinine 0.44 - 1.00 mg/dL 4.40  1.02  7.25   Sodium 135 - 145 mmol/L 136  135  137   Potassium 3.5 - 5.1 mmol/L 4.6  3.8  4.3   Chloride 98 - 111 mmol/L 103  99  103   CO2 22 - 32 mmol/L 29  29  29    Calcium 8.9 - 10.3 mg/dL 9.3  9.5  36.6   Total Protein 6.5 - 8.1 g/dL 6.5  6.6  6.2   Total Bilirubin 0.3 - 1.2 mg/dL 0.5  0.7  0.7   Alkaline Phos 38 - 126 U/L 62  66  57   AST 15 - 41 U/L 17  15  17    ALT 0 - 44 U/L 9  9  8        RADIOGRAPHIC STUDIES: I have personally reviewed the radiological images as listed and agreed with the findings in the report. No  results found.  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  She has incidental findings of what appears to be an early stage localized primary pancreatic cancer on lung cancer surveillance imaging -Diagnosed 05/22/2023 by EUS, T2 N0 adenocarcinoma. CA 19-9 elevated: 256 -Will request TTF-1 to make sure not related to her lung cancer -We discussed various treatment scenarios for pancreatic adeno in general; including surgery, chemo, radiation, and supportive care alone -Due to the abutment of the gastroduodenal artery, this may not be resectable. Her age, emphysema, and independent living are also considerations.  -She understands surgery is likely the only way to cure her cancer, but family member had hard time with whipple in the past, she does not want to go through that which is very reasonable.  She understands all other options will be palliative -We discussed chemotherapy, including single agent gemcitabine vs gem/abraxane, potential benefit, risks, side effects, and palliative goal. She is elderly but without significant co-morbidities and has good PS, she is interested and would likely tolerate less intensive treatment well.  -Chemotherapy consent: Side effects including but not limited to fatigue, low appetite, nausea, vomiting, diarrhea, hair loss, neuropathy, fluid retention, renal and liver dysfunction, neutropenic fever, need  for blood transfusion, bleeding, were discussed with patient in great detail. She agrees to proceed.  -We discussed 3-6 months chemo, followed by restaging, and then consolidation radiation if she responds well and no progression. Will ask Dr. Meridee Score to place fiducials when the times comes. -We also discussed due to her age and preference, we have low threshold to stop chemo and proceed with radiation sooner if she has side effects.  -She  agrees with port placement and chemo class.  -Will draw genetics to check BRCA mutation, if positive would be a candidate for PARP inhibitor. We also discussed the role of molecular testing on the tissue sample to see if she is a candidate for other targeted therapy or immunotherapy. She understands the likelihood of being a candidate for these is low in pancreatic cancer -Plan to start chemo in 1-2 weeks, f/up with C1 -Pt seen with Dr. Mosetta Putt and met our navigator, Clydie Braun  Right lung cancer -Left lung cancer surveillance CT 02/2022 with 7 mm right middle lobe nodule -Surveillance CT 03/03/2023 showed this right middle lobe nodule had enlarged to 12 mm and hypermetabolic on PET scan consistent with synchronous primary bronchogenic carcinoma -Has been refer to Dr. Roselind Messier to consider SBRT  NSCLC adenocarcinoma of the left lung, TTF-1 + T1cN0M0 stage IA -S/p left upper lobectomy with LND 05/2018 by Dr. Dorris Fetch -Followed by Dr. Arbutus Ped on surveillance    PLAN: -Imaging, endoscopy, labs, and path reviewed -Request TTF-1 and molecular studies on pancreas biopsy -Port placement and chemo class to start single agent gemcitabine in 1-2 weeks  -Proceed with R lung SBRT per Dr. Roselind Messier [facility]) -Pt seen with Dr. Mosetta Putt and met Clydie Braun our nagivator; CC'd care team -Genetics labs today to r/o BRCA mutation -F/up with C1 chemo    Orders Placed This Encounter  Procedures   IR IMAGING GUIDED PORT INSERTION    Standing Status:   Future    Standing Expiration Date:   06/08/2024    Order Specific Question:   Reason for Exam (SYMPTOM  OR DIAGNOSIS REQUIRED)    Answer:   durable access for chemo. pancreatic ca    Order Specific Question:   Preferred Imaging Location?    Answer:   [facility]    All questions were answered. The patient knows to call the clinic with any problems, questions or concerns.      Pollyann Samples, NP 06/09/2023    Addendum I have seen the patient, examined her. I  agree with the assessment and and plan and have edited the notes.   82 yo female with PMH of 80 non-small cell lung cancer of left upper lobe status post surgery in September 2019.  During her cancer surveillance, she was found to have a hypermetabolic lesion in the pancreatic head, subsequent biopsy confirmed adenocarcinoma.  She also has a hypermetabolic lung nodule in the right middle lobe, which has been going since May 2023, most consistent with non-small cell lung cancer, it is less likely metastatic pancreatic cancer to lung since it appears before her pancreatic mass. We we will call pathology to see if they have cellblock for TTF-1 test on her pancreatic biopsy, to rule out metastatic lung cancer to pancreas, which is very unusual pattern.  I discussed the aggressive nature of pancreatic cancer, and treatment option including Whipple surgery with neoadjuvant or adjuvant chemotherapy, or palliative chemotherapy and radiation for disease control.  Patient is elderly, with synchronized lung cancer, she is not interested in Whipple surgery,  which she is very understandable and reasonable.  After lengthy discussion, she agrees to start single agent gemcitabine, 2 weeks on and 1 week off, for 3 to 4 months, followed by consolidation radiation if no disease progression.  Chemo consent was obtained today.  Plan to start next week.  She is open to a port placement.  Will review her case in GI tumor board.  All questions were answered.  Malachy Mood MD 06/09/2023

## 2023-06-09 ENCOUNTER — Inpatient Hospital Stay (HOSPITAL_BASED_OUTPATIENT_CLINIC_OR_DEPARTMENT_OTHER): Payer: Medicare Other | Admitting: Nurse Practitioner

## 2023-06-09 ENCOUNTER — Inpatient Hospital Stay: Payer: Medicare Other

## 2023-06-09 ENCOUNTER — Encounter: Payer: Self-pay | Admitting: Hematology

## 2023-06-09 ENCOUNTER — Telehealth: Payer: Self-pay | Admitting: Genetic Counselor

## 2023-06-09 ENCOUNTER — Encounter: Payer: Self-pay | Admitting: Nurse Practitioner

## 2023-06-09 VITALS — BP 156/72 | HR 58 | Temp 97.7°F | Resp 16 | Wt 144.7 lb

## 2023-06-09 DIAGNOSIS — Z1379 Encounter for other screening for genetic and chromosomal anomalies: Secondary | ICD-10-CM

## 2023-06-09 DIAGNOSIS — C250 Malignant neoplasm of head of pancreas: Secondary | ICD-10-CM | POA: Insufficient documentation

## 2023-06-09 DIAGNOSIS — C3490 Malignant neoplasm of unspecified part of unspecified bronchus or lung: Secondary | ICD-10-CM | POA: Diagnosis not present

## 2023-06-09 LAB — GENETIC SCREENING ORDER

## 2023-06-09 MED ORDER — LIDOCAINE-PRILOCAINE 2.5-2.5 % EX CREA
TOPICAL_CREAM | CUTANEOUS | 3 refills | Status: AC
Start: 2023-06-09 — End: ?

## 2023-06-09 MED ORDER — PROCHLORPERAZINE MALEATE 10 MG PO TABS: 10.0000 mg | ORAL_TABLET | Freq: Four times a day (QID) | ORAL | 1 refills | Status: AC | PRN

## 2023-06-09 MED ORDER — ONDANSETRON HCL 8 MG PO TABS: 8.0000 mg | ORAL_TABLET | Freq: Three times a day (TID) | ORAL | 1 refills | Status: AC | PRN

## 2023-06-09 NOTE — Progress Notes (Signed)
I met with [name] and her son after  her consultation with Santiago Glad, NP and Dr Mosetta Putt.  I explained my role as a nurse navigator and provided my contact information. I explained the services provided at [facility] and provided written information. I briefly explained insertion and care of a port a cath.  I told her that our Interventional radiologist will place her port.  I showed a sample of the port a cath.  I told her that she will be scheduled for chemotherapy education class prior to receiving chemotherapy.  I told her our schedulers will call her with those appts.  All questions were answered. they verbalized understanding.

## 2023-06-09 NOTE — Telephone Encounter (Signed)
Blood sample was collected and an order was placed for [facility]-Expanded Panel+RNA on 06/09/2023.   Lalla Brothers, MS, [facility] Genetic Counselor [email or web address] (P) [phone]

## 2023-06-10 ENCOUNTER — Other Ambulatory Visit: Payer: Self-pay

## 2023-06-10 ENCOUNTER — Encounter: Payer: Self-pay | Admitting: Hematology

## 2023-06-10 ENCOUNTER — Telehealth: Payer: Self-pay | Admitting: Hematology

## 2023-06-10 NOTE — Progress Notes (Signed)
Pharmacist Chemotherapy Monitoring - Initial Assessment    Anticipated start date: 06/17/23   The following has been reviewed per standard work regarding the patient's treatment regimen: The patient's diagnosis, treatment plan and drug doses, and organ/hematologic function Lab orders and baseline tests specific to treatment regimen  The treatment plan start date, drug sequencing, and pre-medications Prior authorization status  Patient's documented medication list, including drug-drug interaction screen and prescriptions for anti-emetics and supportive care specific to the treatment regimen The drug concentrations, fluid compatibility, administration routes, and timing of the medications to be used The patient's access for treatment and lifetime cumulative dose history, if applicable  The patient's medication allergies and previous infusion related reactions, if applicable   Changes made to treatment plan:  N/A  Follow up needed:  N/A   Ebony Hail, Pharm.D., CPP 06/10/2023@3 :53 PM

## 2023-06-12 ENCOUNTER — Ambulatory Visit
Admission: RE | Admit: 2023-06-12 | Discharge: 2023-06-12 | Disposition: A | Discharge status: Home / Self Care | Payer: Medicare Other | Source: Ambulatory Visit | Attending: Radiation Oncology | Admitting: Radiation Oncology

## 2023-06-12 DIAGNOSIS — Z510 Encounter for antineoplastic radiation therapy: Secondary | ICD-10-CM | POA: Insufficient documentation

## 2023-06-12 DIAGNOSIS — C342 Malignant neoplasm of middle lobe, bronchus or lung: Secondary | ICD-10-CM | POA: Insufficient documentation

## 2023-06-12 DIAGNOSIS — C3492 Malignant neoplasm of unspecified part of left bronchus or lung: Secondary | ICD-10-CM

## 2023-06-13 ENCOUNTER — Inpatient Hospital Stay: Payer: Medicare Other | Attending: Internal Medicine

## 2023-06-13 ENCOUNTER — Encounter: Payer: Self-pay | Admitting: Cardiology

## 2023-06-13 DIAGNOSIS — C3492 Malignant neoplasm of unspecified part of left bronchus or lung: Secondary | ICD-10-CM | POA: Insufficient documentation

## 2023-06-13 DIAGNOSIS — C250 Malignant neoplasm of head of pancreas: Secondary | ICD-10-CM | POA: Insufficient documentation

## 2023-06-13 DIAGNOSIS — Z5111 Encounter for antineoplastic chemotherapy: Secondary | ICD-10-CM | POA: Insufficient documentation

## 2023-06-13 NOTE — Progress Notes (Signed)
Notice of Potential Device Damage   Patient Name: [name]   Date of Birth: 03-18-2041   Device Information:   Loop-recorder: Yes   Programmable hepatic pump:   Intrathecal pain pump:   Neurostimulator:    The patient listed above is scheduled to receive radiation therapy, which may adversely affect the device listed  above. Every reasonable effort will be made to minimize the radiation dose to the device, including exposure to  neutrons. Though unlikely, possible damage to the battery and electronic components, such as memory storage and  programming, is possible.   Your office may consider preserving valuable patient monitoring information stored on the device prior to the start of  radiation therapy and checking the device programming after the patient completes radiation therapy.    TO BE COMPLETED BY RADIATION ONCOLOGIST'S OFFICE   Scheduled start date of radiation therapy: 06/30/23   Scheduled end date of radiation therapy: 07/11/23   Site to be treated: Right Lung   Radiation Oncologist: Dr. Antony Blackbird    TO BE COMPLETED BY RECIPIENT'S OFFICE   **By signing you are indicating that you've received this notice and no further action is needed:   Physician's signature and date: Dr. Steffanie Dunn 06/13/2023

## 2023-06-16 LAB — CYTOLOGY - NON PAP

## 2023-06-17 ENCOUNTER — Inpatient Hospital Stay (HOSPITAL_BASED_OUTPATIENT_CLINIC_OR_DEPARTMENT_OTHER): Payer: Medicare Other | Admitting: Hematology

## 2023-06-17 ENCOUNTER — Inpatient Hospital Stay: Payer: Medicare Other

## 2023-06-17 ENCOUNTER — Encounter: Payer: Self-pay | Admitting: Hematology

## 2023-06-17 VITALS — BP 154/68 | HR 53 | Temp 98.2°F | Resp 18 | Ht 62.0 in | Wt 145.5 lb

## 2023-06-17 VITALS — BP 155/79 | HR 55 | Temp 98.0°F | Resp 16

## 2023-06-17 DIAGNOSIS — C250 Malignant neoplasm of head of pancreas: Secondary | ICD-10-CM

## 2023-06-17 DIAGNOSIS — C3492 Malignant neoplasm of unspecified part of left bronchus or lung: Secondary | ICD-10-CM

## 2023-06-17 DIAGNOSIS — Z5111 Encounter for antineoplastic chemotherapy: Secondary | ICD-10-CM | POA: Diagnosis present

## 2023-06-17 LAB — CBC WITH DIFFERENTIAL (CANCER CENTER ONLY)
Abs Immature Granulocytes: 0.01 10*3/uL (ref 0.00–0.07)
Basophils Absolute: 0 10*3/uL (ref 0.0–0.1)
Basophils Relative: 0 %
Eosinophils Absolute: 0.1 10*3/uL (ref 0.0–0.5)
Eosinophils Relative: 1 %
HCT: 39.8 % (ref 36.0–46.0)
Hemoglobin: 13.5 g/dL (ref 12.0–15.0)
Immature Granulocytes: 0 %
Lymphocytes Relative: 18 %
Lymphs Abs: 1.6 10*3/uL (ref 0.7–4.0)
MCH: 32.9 pg (ref 26.0–34.0)
MCHC: 33.9 g/dL (ref 30.0–36.0)
MCV: 97.1 fL (ref 80.0–100.0)
Monocytes Absolute: 0.5 10*3/uL (ref 0.1–1.0)
Monocytes Relative: 6 %
Neutro Abs: 6.5 10*3/uL (ref 1.7–7.7)
Neutrophils Relative %: 75 %
Platelet Count: 210 10*3/uL (ref 150–400)
RBC: 4.1 MIL/uL (ref 3.87–5.11)
RDW: 12.2 % (ref 11.5–15.5)
WBC Count: 8.7 10*3/uL (ref 4.0–10.5)
nRBC: 0 % (ref 0.0–0.2)

## 2023-06-17 LAB — CMP (CANCER CENTER ONLY)
ALT: 7 U/L (ref 0–44)
AST: 14 U/L — ABNORMAL LOW (ref 15–41)
Albumin: 4.2 g/dL (ref 3.5–5.0)
Alkaline Phosphatase: 67 U/L (ref 38–126)
Anion gap: 8 (ref 5–15)
BUN: 18 mg/dL (ref 8–23)
CO2: 27 mmol/L (ref 22–32)
Calcium: 9.3 mg/dL (ref 8.9–10.3)
Chloride: 101 mmol/L (ref 98–111)
Creatinine: 0.83 mg/dL (ref 0.44–1.00)
GFR, Estimated: >60 mL/min (ref >60)
Glucose, Bld: 111 mg/dL — ABNORMAL HIGH (ref 70–99)
Potassium: 3.5 mmol/L (ref 3.5–5.1)
Sodium: 136 mmol/L (ref 135–145)
Total Bilirubin: 0.6 mg/dL (ref 0.3–1.2)
Total Protein: 6.8 g/dL (ref 6.5–8.1)

## 2023-06-17 MED ORDER — PROCHLORPERAZINE MALEATE 10 MG PO TABS
10.0000 mg | ORAL_TABLET | Freq: Once | ORAL | Status: AC
  Administered 2023-06-17: 10 mg via ORAL
  Filled 2023-06-17: qty 1

## 2023-06-17 MED ORDER — SODIUM CHLORIDE 0.9 % IV SOLN: Freq: Once | INTRAVENOUS | Status: AC

## 2023-06-17 MED ORDER — SODIUM CHLORIDE 0.9 % IV SOLN
1000.0000 mg/m2 | Freq: Once | INTRAVENOUS | Status: AC
  Administered 2023-06-17: 1672 mg via INTRAVENOUS
  Filled 2023-06-17: qty 43.97

## 2023-06-17 NOTE — Patient Instructions (Addendum)
[facility]  Discharge Instructions: Thank you for choosing [facility] to provide your oncology and hematology care.   If you have a lab appointment with the Cancer Center, please go directly to the Cancer Center and check in at the registration area.   Wear comfortable clothing and clothing appropriate for easy access to any Portacath or PICC line.   We strive to give you quality time with your provider. You may need to reschedule your appointment if you arrive late (15 or more minutes).  Arriving late affects you and other patients whose appointments are after yours.  Also, if you miss three or more appointments without notifying the office, you may be dismissed from the clinic at the provider's discretion.      For prescription refill requests, have your pharmacy contact our office and allow 72 hours for refills to be completed.    Today you received the following chemotherapy and/or immunotherapy agent: Gemcitabine (Gemzar)   To help prevent nausea and vomiting after your treatment, we encourage you to take your nausea medication as directed.  BELOW ARE SYMPTOMS THAT SHOULD BE REPORTED IMMEDIATELY: *FEVER GREATER THAN 100.4 F (38 C) OR HIGHER *CHILLS OR SWEATING *NAUSEA AND VOMITING THAT IS NOT CONTROLLED WITH YOUR NAUSEA MEDICATION *UNUSUAL SHORTNESS OF BREATH *UNUSUAL BRUISING OR BLEEDING *URINARY PROBLEMS (pain or burning when urinating, or frequent urination) *BOWEL PROBLEMS (unusual diarrhea, constipation, pain near the anus) TENDERNESS IN MOUTH AND THROAT WITH OR WITHOUT PRESENCE OF ULCERS (sore throat, sores in mouth, or a toothache) UNUSUAL RASH, SWELLING OR PAIN  UNUSUAL VAGINAL DISCHARGE OR ITCHING   Items with * indicate a potential emergency and should be followed up as soon as possible or go to the Emergency Department if any problems should occur.  Please show the CHEMOTHERAPY ALERT CARD or IMMUNOTHERAPY ALERT CARD  at check-in to the Emergency Department and triage nurse.  Should you have questions after your visit or need to cancel or reschedule your appointment, please contact [facility]  Dept: [phone]  and follow the prompts.  Office hours are 8:00 a.m. to 4:30 p.m. Monday - Friday. Please note that voicemails left after 4:00 p.m. may not be returned until the following business day.  We are closed weekends and major holidays. You have access to a nurse at all times for urgent questions. Please call the main number to the clinic Dept: [phone] and follow the prompts.   For any non-urgent questions, you may also contact your provider using MyChart. We now offer e-Visits for anyone 58 and older to request care online for non-urgent symptoms. For details visit [email or web address].   Also download the MyChart app! Go to the app store, search "MyChart", open the app, select [facility], and log in with your MyChart username and password. Gemcitabine Injection What is this medication? GEMCITABINE (jem SYE ta been) treats some types of cancer. It works by slowing down the growth of cancer cells. This medicine may be used for other purposes; ask your health care provider or pharmacist if you have questions. COMMON BRAND NAME(S): Gemzar, Infugem What should I tell my care team before I take this medication? They need to know if you have any of these conditions: Blood disorders Infection Kidney disease Liver disease Lung or breathing disease, such as asthma or COPD Recent or ongoing radiation therapy An unusual or allergic reaction to gemcitabine, other medications, foods, dyes, or preservatives If you  or your partner are pregnant or trying to get pregnant Breast-feeding How should I use this medication? This medication is injected into a vein. It is given by your care team in a hospital or clinic setting. Talk to your care team about the use of this  medication in children. Special care may be needed. Overdosage: If you think you have taken too much of this medicine contact a poison control center or emergency room at once. NOTE: This medicine is only for you. Do not share this medicine with others. What if I miss a dose? Keep appointments for follow-up doses. It is important not to miss your dose. Call your care team if you are unable to keep an appointment. What may interact with this medication? Interactions have not been studied. This list may not describe all possible interactions. Give your health care provider a list of all the medicines, herbs, non-prescription drugs, or dietary supplements you use. Also tell them if you smoke, drink alcohol, or use illegal drugs. Some items may interact with your medicine. What should I watch for while using this medication? Your condition will be monitored carefully while you are receiving this medication. This medication may make you feel generally unwell. This is not uncommon, as chemotherapy can affect healthy cells as well as cancer cells. Report any side effects. Continue your course of treatment even though you feel ill unless your care team tells you to stop. In some cases, you may be given additional medications to help with side effects. Follow all directions for their use. This medication may increase your risk of getting an infection. Call your care team for advice if you get a fever, chills, sore throat, or other symptoms of a cold or flu. Do not treat yourself. Try to avoid being around people who are sick. This medication may increase your risk to bruise or bleed. Call your care team if you notice any unusual bleeding. Be careful brushing or flossing your teeth or using a toothpick because you may get an infection or bleed more easily. If you have any dental work done, tell your dentist you are receiving this medication. Avoid taking medications that contain aspirin, acetaminophen,  ibuprofen, naproxen, or ketoprofen unless instructed by your care team. These medications may hide a fever. Talk to your care team if you or your partner wish to become pregnant or think you might be pregnant. This medication can cause serious birth defects if taken during pregnancy and for 6 months after the last dose. A negative pregnancy test is required before starting this medication. A reliable form of contraception is recommended while taking this medication and for 6 months after the last dose. Talk to your care team about effective forms of contraception. Do not father a child while taking this medication and for 3 months after the last dose. Use a condom while having sex during this time period. Do not breastfeed while taking this medication and for at least 1 week after the last dose. This medication may cause infertility. Talk to your care team if you are concerned about your fertility. What side effects may I notice from receiving this medication? Side effects that you should report to your care team as soon as possible: Allergic reactions--skin rash, itching, hives, swelling of the face, lips, tongue, or throat Capillary leak syndrome--stomach or muscle pain, unusual weakness or fatigue, feeling faint or lightheaded, decrease in the amount of urine, swelling of the ankles, hands, or feet, trouble breathing Infection--fever, chills, cough, sore  throat, wounds that don't heal, pain or trouble when passing urine, general feeling of discomfort or being unwell Liver injury--right upper belly pain, loss of appetite, nausea, light-colored stool, dark yellow or brown urine, yellowing skin or eyes, unusual weakness or fatigue Low red blood cell level--unusual weakness or fatigue, dizziness, headache, trouble breathing Lung injury--shortness of breath or trouble breathing, cough, spitting up blood, chest pain, fever Stomach pain, bloody diarrhea, pale skin, unusual weakness or fatigue, decrease in  the amount of urine, which may be signs of hemolytic uremic syndrome Sudden and severe headache, confusion, change in vision, seizures, which may be signs of posterior reversible encephalopathy syndrome (PRES) Unusual bruising or bleeding Side effects that usually do not require medical attention (report to your care team if they continue or are bothersome): Diarrhea Drowsiness Hair loss Nausea Pain, redness, or swelling with sores inside the mouth or throat Vomiting This list may not describe all possible side effects. Call your doctor for medical advice about side effects. You may report side effects to FDA at 1-800-FDA-1088. Where should I keep my medication? This medication is given in a hospital or clinic. It will not be stored at home. NOTE: This sheet is a summary. It may not cover all possible information. If you have questions about this medicine, talk to your doctor, pharmacist, or health care provider.  2024 [employer]/Gold Standard (2022-01-01 00:00:00)

## 2023-06-17 NOTE — Progress Notes (Signed)
[facility]   [phone] Fax:([phone]   Clinic Follow up Note   Patient Care Team: Beam, Chales Salmon, MD as PCP - General (Family Medicine) Corky Crafts, MD as PCP - Cardiology (Cardiology) Loreli Slot, MD as Consulting Physician (Cardiothoracic Surgery) Malachy Mood, MD as Consulting Physician (Oncology) Pollyann Samples, NP as Nurse Practitioner (Nurse Practitioner) Antony Blackbird, MD as Consulting Physician (Radiation Oncology)  Date of Service:  06/17/2023  CHIEF COMPLAINT: f/u of pancreatic cancer  CURRENT THERAPY:  First-line chemotherapy gemcitabine  Oncology History   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 -Patient declined Whipple surgery due to her advanced age and diagnosis of lung cancer at the same time. -Patient agreed with low intensity chemotherapy, plan to start single agent gemcitabine on June 17, 2023, for a total of 3 to 4 months, followed by consolidation radiation if no cancer progression on next restaging CT.  Adenocarcinoma of left lung, stage 1 (HCC) -She had a history of stage Ia non-small cell lung cancer/adenocarcinoma of left lung, status post lobectomy in September 2019 -On surveillance CT scan in June 2024, she was found to have 7 mm right middle lobe nodule, increased to 12 mm and hypermetabolic on PET scan in September 2024, she also has 2 additional 7 mm lung nodules which were not hypermetabolic on PET scan. -Plan to proceed with SBRT by Dr. Roselind Messier, biopsy was not recommended by Dr. Arbutus Ped     Assessment and Plan    Pancreatic cancer First chemotherapy session today. Discussed potential side effects including fatigue and nausea. Patient has two types of nausea  medication at home and understands how to use them. - Proceed with chemotherapy today and next week, followed by a one-week break. - Anticipate fatigue and potential decrease in appetite. - Use Zofran for nausea tonight, and alternate with Compazine as needed. - Follow-up next week before second dose of chemotherapy. -Discussed potential overlap of radiation and chemotherapy sessions. Decided to postpone C2D1 chemotherapy until after completion of radiation therapy.  Lung cancer Scheduled for two weeks of radiation therapy starting the week of October 21st - Proceed with radiation therapy as scheduled.  Sleep Patient uses melatonin to aid sleep. - Continue use of melatonin as it does not interfere with treatment.  Plan -Lab reviewed, adequate for treatment, will proceed cycle 1 day 1 gemcitabine today -Labs, follow-up and cycle 1 day 8 gemcitabine next week -Plan to postpone cycle 2-day 1 chemo 4 weeks due to radiation        SUMMARY OF ONCOLOGIC HISTORY: Oncology History  Adenocarcinoma of left lung, stage 1 (HCC)  07/06/2018 Initial Diagnosis   Adenocarcinoma of left lung, stage 1 (HCC)   02/15/2021 Cancer Staging   Staging form: Lung, AJCC 8th Edition - Clinical: Stage IA3 (cT1c, cN0, cM0) - Signed by Si Gaul, MD on 02/15/2021   Adenocarcinoma of head of pancreas (HCC)  03/03/2023 Imaging   CT chest IMPRESSION: 1. Enlarging and concerning right lung nodules, the largest in the right middle lobe measuring up to 1.4 cm in diameter, highly suspicious for primary bronchogenic carcinoma. The right middle lobe nodule is large enough to evaluate by PET-CT which is recommended. 2. No evidence of thoracic adenopathy or pleural effusion. No suspicious left lung nodule status post upper lobe  resection. 3. Aortic Atherosclerosis (ICD10-I70.0) and Emphysema (ICD10-J43.9).   03/28/2023 PET scan   IMPRESSION: 1. 12 mm medial right middle lobe pulmonary nodule is hypermetabolic and  consistent with primary neoplasm. 2. 7 mm right lower lobe nodule and 7 mm right upper lobe nodule do not demonstrate any hypermetabolism but will need close surveillance. 3. No mediastinal or hilar lymphadenopathy. 4. No findings for osseous metastatic disease. 5. Abnormal appearance of the pancreatic head and mild hypermetabolism worrisome for neoplasm. Recommend MRI abdomen without and with contrast for further evaluation. Aortic Atherosclerosis (ICD10-I70.0).   05/04/2023 Imaging   ABD MRI IMPRESSION: 1. Suspicious, solid lesion within the head of pancreas 3.0 x 2.4 x 2.4 cm. There is associated main duct dilatation within the head through tail of pancreas. Suspected lesion corresponds to the area of increased tracer uptake noted on the recent PET-CT. Findings are concerning for pancreatic adenocarcinoma. Motion artifact on the postcontrast images diminishes exam detail within the pancreas. Difficult to exclude involvement of the portal vein. The celiac trunk and proximal SMA appear uninvolved. Consider further evaluation with endoscopic ultrasound and possibly CTA of the abdomen. 2. Scattered T2 hyperintense and T1 hypointense foci within the liver are favored to represent multiple cysts. Unfortunately, motion artifact diminishes exam detail within the liver for the evaluation underlying enhancing liver lesions. Within this limitation, no obvious suspicious lesion identified at this time. 3. Right middle lobe lung nodule is again identified as seen on the previous PET-CT.   05/04/2023 Imaging   Brain MRI IMPRESSION: No evidence of intracranial metastatic disease.   05/16/2023 Tumor Marker   CA 19-9: 256   05/22/2023 Cancer Staging   Staging form: Exocrine Pancreas, AJCC 8th Edition - Clinical stage from 05/22/2023: Stage IB (cT2, cN0, cM0) - Signed by Pollyann Samples, NP on 06/09/2023 Stage prefix: Initial diagnosis Total positive nodes: 0   05/22/2023 Procedure   EUS by Dr.  Meridee Score: 29 x 28 mm mass in the pancreatic head appearing suspicious for adeno, no malignant appearing lymph nodes were visualized.  Abuts the gastroduodenal artery but not involved with the SMA or celiac trunk.  T2 N0 by EUS    05/22/2023 Initial Biopsy    A. PANCREAS, HEAD LESION, FINE NEEDLE ASPIRATION:  FINAL MICROSCOPIC DIAGNOSIS:  - Adenocarcinoma    06/09/2023 Initial Diagnosis   Adenocarcinoma of head of pancreas (HCC)   06/17/2023 -  Chemotherapy   Patient is on Treatment Plan : LUNG Gemcitabine (1000) D1,8 q21d        Discussed the use of AI scribe software for clinical note transcription with the patient, who gave verbal consent to proceed.  History of Present Illness   The patient, with a history of cancer, presents for her first chemotherapy treatment. She attended a chemotherapy education class and has no questions about the treatment. She inquires about the use of melatonin for sleep during chemotherapy, which is deemed safe. She has picked up her prescribed nausea medication and understands how to use it. The patient lives alone, but will have company for the night following her first treatment. The patient also mentions a non-cancerous nodule close to her heart that will likely become cancerous and will be targeted during radiation therapy.         All other systems were reviewed with the patient and are negative.  MEDICAL HISTORY:  Past Medical History:  Diagnosis Date   Anxiety    Blood glucose elevated 05/27/2012   pt. 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   Overview:  Dr Elnoria Howard  Last Assessment & Plan:  Relevant Hx: Course: Daily Update: Today's Plan:     GERD (gastroesophageal reflux disease)    Hemorrhoid 12/06/2015   History of hiatal hernia    HLD (hyperlipidemia) 12/03/2011   Hypertension    Lung cancer (HCC) dx'd 04/2018   Osteopenia 05/30/2015   Overview:  Bone density 05/2015 started fosamax    Primary localized osteoarthritis of left knee    Primary localized osteoarthritis of right knee 12/06/2015    SURGICAL HISTORY: Past Surgical History:  Procedure Laterality Date   ABDOMINAL HYSTERECTOMY     BIOPSY  05/22/2023   Procedure: BIOPSY;  Surgeon: Lemar Lofty., MD;  Location: [facility];  Service: Gastroenterology;;   CATARACT EXTRACTION, BILATERAL     COLONOSCOPY     ESOPHAGOGASTRODUODENOSCOPY (EGD) WITH PROPOFOL N/A 05/22/2023   Procedure: ESOPHAGOGASTRODUODENOSCOPY (EGD) WITH PROPOFOL;  Surgeon: Lemar Lofty., MD;  Location: [facility];  Service: Gastroenterology;  Laterality: N/A;   EUS N/A 05/22/2023   Procedure: UPPER ENDOSCOPIC ULTRASOUND (EUS) RADIAL;  Surgeon: Lemar Lofty., MD;  Location: [facility];  Service: Gastroenterology;  Laterality: N/A;   EYE SURGERY Bilateral    Cataract   FINE NEEDLE ASPIRATION  05/22/2023   Procedure: FINE NEEDLE ASPIRATION;  Surgeon: Lemar Lofty., MD;  Location: [facility];  Service: Gastroenterology;;   implantable loop recorder placement  10/26/2018   MDT LINQ Reveal XT ILR implanted for evaluation of palpitations and atrial fibrillation in office by Dr Johney Frame, Device SN [id number])    KNEE SURGERY  05/01/2016   left uncompartmental    PARTIAL KNEE ARTHROPLASTY Right 12/18/2015   Procedure: UNICOMPARTMENTAL RIGHT KNEE;  Surgeon: Salvatore Marvel, MD;  Location: [facility] OR;  Service: Orthopedics;  Laterality: Right;   PARTIAL KNEE ARTHROPLASTY Left 05/01/2016   Procedure: UNICOMPARTMENTAL KNEE;  Surgeon: Salvatore Marvel, MD;  Location: [facility] OR;  Service: Orthopedics;  Laterality: Left;   POLYPECTOMY  05/22/2023   Procedure: POLYPECTOMY;  Surgeon:  Mansouraty, Netty Starring., MD;  Location: [facility];  Service: Gastroenterology;;   TOTAL ABDOMINAL HYSTERECTOMY W/ BILATERAL SALPINGOOPHORECTOMY     VIDEO ASSISTED THORACOSCOPY (VATS)/ LOBECTOMY Left 05/13/2018   Procedure: VIDEO ASSISTED THORACOSCOPY (VATS)/LEFT UPPER LOBECTOMY;  Surgeon: Loreli Slot, MD;  Location: [facility] OR;  Service: Thoracic;  Laterality: Left;    I have reviewed the social history and family history with the patient and they are unchanged from previous note.  ALLERGIES:  is allergic to [employer] tartrate], sulfa antibiotics, codeine, hydrocodone, and meperidine.  MEDICATIONS:  Current Outpatient Medications  Medication Sig Dispense Refill   aspirin EC 81 MG tablet Take 1 tablet (81 mg total) by mouth daily. Swallow whole. (Patient not taking: Reported on 06/09/2023) 90 tablet 3   Calcium Carb-Cholecalciferol (CALCIUM-VITAMIN D3) 600-200 MG-UNIT TABS Take by mouth daily.     cetirizine (ZYRTEC) 10 MG tablet Take 10 mg by mouth as needed for allergies.     diphenhydramine-acetaminophen (TYLENOL PM) 25-500 MG TABS tablet Take 2 tablets by mouth at bedtime as needed (for sleep).     escitalopram (LEXAPRO) 20 MG tablet Take by mouth daily.  lidocaine-prilocaine (EMLA) cream Apply to affected area once 30 g 3   metoprolol succinate (TOPROL-XL) 50 MG 24 hr tablet Take 50 mg by mouth daily. Take with or immediately following a meal.     Multiple Vitamins-Minerals (MULTIVITAMIN PO) Take 1 tablet by mouth daily.     ondansetron (ZOFRAN) 8 MG tablet Take 1 tablet (8 mg total) by mouth every 8 (eight) hours as needed for nausea or vomiting. 30 tablet 1   pantoprazole (PROTONIX) 40 MG tablet Take 40 mg by mouth daily.     polyethylene glycol (MIRALAX / GLYCOLAX) packet Take 8.5 g by mouth every other day.     prochlorperazine (COMPAZINE) 10 MG tablet Take 1 tablet (10 mg total) by mouth every 6 (six) hours as needed for nausea or vomiting. 30 tablet 1   simvastatin  (ZOCOR) 20 MG tablet Take 20 mg by mouth daily.     No current facility-administered medications for this visit.    PHYSICAL EXAMINATION: ECOG PERFORMANCE STATUS: 1 - Symptomatic but completely ambulatory  Vitals:   06/17/23 1503  BP: (!) 154/68  Pulse: (!) 53  Resp: 18  Temp: 98.2 F (36.8 C)  SpO2: 100%   Wt Readings from Last 3 Encounters:  06/17/23 145 lb 8 oz (66 kg)  06/09/23 144 lb 11.2 oz (65.6 kg)  06/03/23 144 lb 3 oz (65.4 kg)     GENERAL:alert, no distress and comfortable SKIN: skin color, texture, turgor are normal, no rashes or significant lesions EYES: normal, Conjunctiva are pink and non-injected, sclera clear NECK: supple, thyroid normal size, non-tender, without nodularity LYMPH:  no palpable lymphadenopathy in the cervical, axillary  LUNGS: clear to auscultation and percussion with normal breathing effort HEART: regular rate & rhythm and no murmurs and no lower extremity edema ABDOMEN:abdomen soft, non-tender and normal bowel sounds Musculoskeletal:no cyanosis of digits and no clubbing  NEURO: alert & oriented x 3 with fluent speech, no focal motor/sensory deficits    LABORATORY DATA:  I have reviewed the data as listed    Latest Ref Rng & Units 06/17/2023    2:24 PM 06/03/2023   11:18 AM 03/03/2023   10:48 AM  CBC  WBC 4.0 - 10.5 K/uL 8.7  8.6  7.5   Hemoglobin 12.0 - 15.0 g/dL 53.6  64.4  03.4   Hematocrit 36.0 - 46.0 % 39.8  36.8  41.4   Platelets 150 - 400 K/uL 210  229  197         Latest Ref Rng & Units 06/17/2023    2:24 PM 06/03/2023   11:18 AM 05/16/2023    2:11 PM  CMP  Glucose 70 - 99 mg/dL 742  595  638   BUN 8 - 23 mg/dL 18  17  14    Creatinine 0.44 - 1.00 mg/dL 7.56  4.33  2.95   Sodium 135 - 145 mmol/L 136  136  135   Potassium 3.5 - 5.1 mmol/L 3.5  4.6  3.8   Chloride 98 - 111 mmol/L 101  103  99   CO2 22 - 32 mmol/L 27  29  29    Calcium 8.9 - 10.3 mg/dL 9.3  9.3  9.5   Total Protein 6.5 - 8.1 g/dL 6.8  6.5  6.6   Total  Bilirubin 0.3 - 1.2 mg/dL 0.6  0.5  0.7   Alkaline Phos 38 - 126 U/L 67  62  66   AST 15 - 41 U/L 14  17  15  ALT 0 - 44 U/L 7  9  9        RADIOGRAPHIC STUDIES: I have personally reviewed the radiological images as listed and agreed with the findings in the report. No results found.    No orders of the defined types were placed in this encounter.  All questions were answered. The patient knows to call the clinic with any problems, questions or concerns. No barriers to learning was detected. The total time spent in the appointment was 25 minutes.     Malachy Mood, MD 06/17/2023

## 2023-06-17 NOTE — Assessment & Plan Note (Addendum)
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 -Plan to proceed with SBRT by Dr. Roselind Messier, biopsy was not recommended by Dr. Arbutus Ped

## 2023-06-17 NOTE — Assessment & Plan Note (Signed)
cT3N0M0 -Incidental findings on the CT scan for lung cancer screening/follow-up -Diagnosed in September 2024 by EUS baseline CA 19.9 256 -Patient has synchronized 2 hypermetabolic lesion in her lungs, highly suspicious for malignancy. -Patient declined Whipple surgery due to her advanced age and diagnosis of lung cancer at the same time. -Patient agreed with low intensity chemotherapy, plan to start single agent gemcitabine on June 17, 2023, for a total of 3 to 4 months, followed by consolidation radiation if no cancer progression on next restaging CT.

## 2023-06-18 ENCOUNTER — Other Ambulatory Visit: Payer: Self-pay | Admitting: Hematology

## 2023-06-18 ENCOUNTER — Telehealth: Payer: Self-pay

## 2023-06-18 ENCOUNTER — Telehealth: Payer: Self-pay | Admitting: Hematology

## 2023-06-18 DIAGNOSIS — C250 Malignant neoplasm of head of pancreas: Secondary | ICD-10-CM

## 2023-06-18 NOTE — Telephone Encounter (Signed)
-----   Message from Nurse Dillard Essex sent at 06/17/2023  5:21 PM EDT ----- Regarding: Dr. Mosetta Putt Pt. first time Gemzar, tolerated well no issues noted.

## 2023-06-18 NOTE — Telephone Encounter (Signed)
[name]  states that she is doing fine. She is eating, drinking, and urinating well. She knows to call the office at [phone] if  she has any questions or concerns.

## 2023-06-19 DIAGNOSIS — Z510 Encounter for antineoplastic radiation therapy: Secondary | ICD-10-CM | POA: Diagnosis not present

## 2023-06-23 ENCOUNTER — Inpatient Hospital Stay (HOSPITAL_BASED_OUTPATIENT_CLINIC_OR_DEPARTMENT_OTHER): Payer: Medicare Other | Admitting: Hematology

## 2023-06-23 ENCOUNTER — Inpatient Hospital Stay: Payer: Medicare Other

## 2023-06-23 ENCOUNTER — Other Ambulatory Visit: Payer: Self-pay | Admitting: Radiology

## 2023-06-23 ENCOUNTER — Encounter: Payer: Self-pay | Admitting: Hematology

## 2023-06-23 VITALS — BP 142/69 | HR 57 | Temp 97.8°F | Resp 18 | Ht 62.0 in | Wt 146.4 lb

## 2023-06-23 DIAGNOSIS — C250 Malignant neoplasm of head of pancreas: Secondary | ICD-10-CM

## 2023-06-23 DIAGNOSIS — Z5111 Encounter for antineoplastic chemotherapy: Secondary | ICD-10-CM | POA: Diagnosis not present

## 2023-06-23 DIAGNOSIS — C3492 Malignant neoplasm of unspecified part of left bronchus or lung: Secondary | ICD-10-CM | POA: Diagnosis not present

## 2023-06-23 LAB — CBC WITH DIFFERENTIAL (CANCER CENTER ONLY)
Abs Immature Granulocytes: 0.02 10*3/uL (ref 0.00–0.07)
Basophils Absolute: 0 10*3/uL (ref 0.0–0.1)
Basophils Relative: 0 %
Eosinophils Absolute: 0.1 10*3/uL (ref 0.0–0.5)
Eosinophils Relative: 2 %
HCT: 33.5 % — ABNORMAL LOW (ref 36.0–46.0)
Hemoglobin: 11.4 g/dL — ABNORMAL LOW (ref 12.0–15.0)
Immature Granulocytes: 0 %
Lymphocytes Relative: 19 %
Lymphs Abs: 0.9 10*3/uL (ref 0.7–4.0)
MCH: 33 pg (ref 26.0–34.0)
MCHC: 34 g/dL (ref 30.0–36.0)
MCV: 97.1 fL (ref 80.0–100.0)
Monocytes Absolute: 0.1 10*3/uL (ref 0.1–1.0)
Monocytes Relative: 2 %
Neutro Abs: 3.7 10*3/uL (ref 1.7–7.7)
Neutrophils Relative %: 77 %
Platelet Count: 160 10*3/uL (ref 150–400)
RBC: 3.45 MIL/uL — ABNORMAL LOW (ref 3.87–5.11)
RDW: 12.1 % (ref 11.5–15.5)
WBC Count: 4.8 10*3/uL (ref 4.0–10.5)
nRBC: 0 % (ref 0.0–0.2)

## 2023-06-23 LAB — CMP (CANCER CENTER ONLY)
ALT: 7 U/L (ref 0–44)
AST: 15 U/L (ref 15–41)
Albumin: 3.9 g/dL (ref 3.5–5.0)
Alkaline Phosphatase: 64 U/L (ref 38–126)
Anion gap: 6 (ref 5–15)
BUN: 18 mg/dL (ref 8–23)
CO2: 27 mmol/L (ref 22–32)
Calcium: 9.7 mg/dL (ref 8.9–10.3)
Chloride: 102 mmol/L (ref 98–111)
Creatinine: 0.88 mg/dL (ref 0.44–1.00)
GFR, Estimated: >60 mL/min (ref >60)
Glucose, Bld: 119 mg/dL — ABNORMAL HIGH (ref 70–99)
Potassium: 4.3 mmol/L (ref 3.5–5.1)
Sodium: 135 mmol/L (ref 135–145)
Total Bilirubin: 0.6 mg/dL (ref 0.3–1.2)
Total Protein: 6.3 g/dL — ABNORMAL LOW (ref 6.5–8.1)

## 2023-06-23 MED ORDER — SODIUM CHLORIDE 0.9 % IV SOLN: Freq: Once | INTRAVENOUS | Status: AC

## 2023-06-23 MED ORDER — PROCHLORPERAZINE MALEATE 10 MG PO TABS
10.0000 mg | ORAL_TABLET | Freq: Once | ORAL | Status: AC
  Administered 2023-06-23: 10 mg via ORAL
  Filled 2023-06-23: qty 1

## 2023-06-23 MED ORDER — SODIUM CHLORIDE 0.9 % IV SOLN
1000.0000 mg/m2 | Freq: Once | INTRAVENOUS | Status: AC
  Administered 2023-06-23: 1672 mg via INTRAVENOUS
  Filled 2023-06-23: qty 43.97

## 2023-06-23 NOTE — Consult Note (Signed)
Chief Complaint: Patient was seen in consultation today for Port-A-Cath placement  Referring Physician(s): Burton,Lacie K  Supervising Physician: Ruel Favors  Patient Status: [facility] - Out-pt  History of Present Illness: [name] is an 82 y.o. 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  She is scheduled today for Port-A-Cath placement to assist with treatment.  Past Medical History:  Diagnosis Date   Anxiety    Blood glucose elevated 05/27/2012   pt. 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   Overview:  Dr Elnoria Howard  Last Assessment & Plan:  Relevant Hx: Course: Daily Update: Today's Plan:    GERD (gastroesophageal reflux disease)    Hemorrhoid 12/06/2015   History of hiatal hernia    HLD (hyperlipidemia) 12/03/2011   Hypertension    Lung cancer (HCC) dx'd 04/2018   Osteopenia 05/30/2015   Overview:  Bone density 05/2015 started fosamax    Primary localized osteoarthritis of left knee    Primary localized osteoarthritis of right knee 12/06/2015    Past Surgical History:  Procedure Laterality Date   ABDOMINAL HYSTERECTOMY     BIOPSY  05/22/2023   Procedure: BIOPSY;  Surgeon: Lemar Lofty., MD;  Location: [facility];  Service: Gastroenterology;;   CATARACT EXTRACTION, BILATERAL     COLONOSCOPY     ESOPHAGOGASTRODUODENOSCOPY (EGD) WITH PROPOFOL N/A 05/22/2023   Procedure: ESOPHAGOGASTRODUODENOSCOPY (EGD) WITH  PROPOFOL;  Surgeon: Lemar Lofty., MD;  Location: [facility];  Service: Gastroenterology;  Laterality: N/A;   EUS N/A 05/22/2023   Procedure: UPPER ENDOSCOPIC ULTRASOUND (EUS) RADIAL;  Surgeon: Lemar Lofty., MD;  Location: [facility];  Service: Gastroenterology;  Laterality: N/A;   EYE SURGERY Bilateral    Cataract   FINE NEEDLE ASPIRATION  05/22/2023   Procedure: FINE NEEDLE ASPIRATION;  Surgeon: Lemar Lofty., MD;  Location: [facility];  Service: Gastroenterology;;   implantable loop recorder placement  10/26/2018   MDT LINQ Reveal XT ILR implanted for evaluation of palpitations and atrial fibrillation in office by Dr Johney Frame, Device SN [id number])    KNEE SURGERY  05/01/2016   left uncompartmental    PARTIAL KNEE ARTHROPLASTY Right 12/18/2015   Procedure: UNICOMPARTMENTAL RIGHT KNEE;  Surgeon: Salvatore Marvel, MD;  Location: [facility] OR;  Service: Orthopedics;  Laterality: Right;   PARTIAL KNEE ARTHROPLASTY Left 05/01/2016   Procedure: UNICOMPARTMENTAL KNEE;  Surgeon: Salvatore Marvel, MD;  Location: [facility] OR;  Service: Orthopedics;  Laterality: Left;   POLYPECTOMY  05/22/2023   Procedure: POLYPECTOMY;  Surgeon: Mansouraty, Netty Starring., MD;  Location: [facility];  Service: Gastroenterology;;   TOTAL ABDOMINAL HYSTERECTOMY W/ BILATERAL SALPINGOOPHORECTOMY     VIDEO ASSISTED THORACOSCOPY (VATS)/ LOBECTOMY Left 05/13/2018   Procedure: VIDEO ASSISTED THORACOSCOPY (VATS)/LEFT UPPER LOBECTOMY;  Surgeon: Loreli Slot, MD;  Location: [facility] OR;  Service: Thoracic;  Laterality: Left;    Allergies: Ambien [zolpidem tartrate], Sulfa antibiotics, Codeine, Hydrocodone, and Meperidine  Medications: Prior to Admission medications   Medication Sig Start Date End Date Taking? Authorizing  Provider  aspirin EC 81 MG tablet Take 1 tablet (81 mg total) by mouth daily. Swallow whole. Patient not taking: Reported on 06/09/2023 01/26/21   Corky Crafts, MD  Calcium  Carb-Cholecalciferol (CALCIUM-VITAMIN D3) 600-200 MG-UNIT TABS Take by mouth daily.    [provider]  cetirizine (ZYRTEC) 10 MG tablet Take 10 mg by mouth as needed for allergies.    [provider]  diphenhydramine-acetaminophen (TYLENOL PM) 25-500 MG TABS tablet Take 2 tablets by mouth at bedtime as needed (for sleep).    [provider]  escitalopram (LEXAPRO) 20 MG tablet Take by mouth daily. 11/30/20   [provider]  lidocaine-prilocaine (EMLA) cream Apply to affected area once 06/09/23   Malachy Mood, MD  metoprolol succinate (TOPROL-XL) 50 MG 24 hr tablet Take 50 mg by mouth daily. Take with or immediately following a meal.    [provider]  Multiple Vitamins-Minerals (MULTIVITAMIN PO) Take 1 tablet by mouth daily.    [provider]  ondansetron (ZOFRAN) 8 MG tablet Take 1 tablet (8 mg total) by mouth every 8 (eight) hours as needed for nausea or vomiting. 06/15/23   Malachy Mood, MD  pantoprazole (PROTONIX) 40 MG tablet Take 40 mg by mouth daily. 04/07/23 04/06/24  [provider]  polyethylene glycol (MIRALAX / GLYCOLAX) packet Take 8.5 g by mouth every other day.    [provider]  prochlorperazine (COMPAZINE) 10 MG tablet Take 1 tablet (10 mg total) by mouth every 6 (six) hours as needed for nausea or vomiting. 06/15/23   Malachy Mood, MD  simvastatin (ZOCOR) 20 MG tablet Take 20 mg by mouth daily.    [provider]     Family History  Problem Relation Age of Onset   Congestive Heart Failure Mother    Cancer Father        prostate   Breast cancer Sister    Breast cancer Niece     Social History   Socioeconomic History   Marital status: Widowed    Spouse name: Not on file   Number of children: Not on file   Years of education: Not on file   Highest education level: Not on file  Occupational History   Not on file  Tobacco Use   Smoking status: Former    Current packs/day: 0.00    Types: Cigarettes     Start date: 02/09/1969    Quit date: 02/10/2019    Years since quitting: 4.3   Smokeless tobacco: Never   Tobacco comments:    only one cigarette occasionally  Vaping Use   Vaping status: Never Used  Substance and Sexual Activity   Alcohol use: Yes    Alcohol/week: 1.0 standard drink of alcohol    Types: 1 Glasses of wine per week    Comment: social rare   Drug use: No   Sexual activity: Not Currently  Other Topics Concern   Not on file  Social History Narrative   Not on file   Social Determinants of Health   Financial Resource Strain: Low Risk  (06/02/2023)   Received from [facility]   Overall Financial Resource Strain (CARDIA)    Difficulty of Paying Living Expenses: Not very hard  Food Insecurity: No Food Insecurity (06/02/2023)   Received from [facility]   Hunger Vital Sign    Worried About Running Out of Food in the Last Year: Never true    Ran Out of Food in the Last Year: Never true  Transportation Needs: No Transportation Needs (06/02/2023)   Received from [facility] - Transportation    Lack of Transportation (Medical): No    Lack of Transportation (Non-Medical): No  Physical Activity: Insufficiently Active (06/02/2023)   Received from [facility]   Exercise Vital Sign    Days of Exercise per Week: 3 days    Minutes of Exercise per Session: 30 min  Stress: Stress Concern Present (06/02/2023)   Received from [facility] - Occupational Stress Questionnaire    Feeling of Stress : Rather much  Social Connections: Socially Integrated (06/02/2023)   Received from [facility]   Social Network    How would you rate your social network (family, work, friends)?: Good participation with social networks      Review of Systems  Vital Signs:   Code Status:   Advance Care Plan: No documents on file   Physical Exam  Imaging: No results found.  Labs:  CBC: Recent Labs    03/03/23 1048  06/03/23 1118 06/17/23 1424 06/23/23 0905  WBC 7.5 8.6 8.7 4.8  HGB 14.1 12.8 13.5 11.4*  HCT 41.4 36.8 39.8 33.5*  PLT 197 229 210 160    COAGS: No results for input(s): "INR", "APTT" in the last 8760 hours.  BMP: Recent Labs    03/03/23 1048 05/16/23 1411 06/03/23 1118 06/17/23 1424 06/23/23 0905  NA 137 135 136 136 135  K 4.3 3.8 4.6 3.5 4.3  CL 103 99 103 101 102  CO2 29 29 29 27 27   GLUCOSE 138* 118* 115* 111* 119*  BUN 16 14 17 18 18   CALCIUM 10.1 9.5 9.3 9.3 9.7  CREATININE 0.87 0.79 0.95 0.83 0.88  GFRNONAA >60  --  59* >60 >60    LIVER FUNCTION TESTS: Recent Labs    05/16/23 1411 06/03/23 1118 06/17/23 1424 06/23/23 0905  BILITOT 0.7 0.5 0.6 0.6  AST 15 17 14* 15  ALT 9 9 7 7   ALKPHOS 66 62 67 64  PROT 6.6 6.5 6.8 6.3*  ALBUMIN 3.9 4.0 4.2 3.9    TUMOR MARKERS: Recent Labs    05/16/23 1411  CA199 256*    Assessment and Plan: 82 y.o. 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  She is scheduled today for Port-A-Cath placement to assist with treatment.Risks and benefits of image guided port-a-catheter placement was discussed with the patient including, but not limited to bleeding, infection, pneumothorax, or fibrin sheath development and need for additional procedures.  All of the patient's questions were answered, patient is agreeable to proceed. Consent signed and in chart.    Thank you for this interesting consult.  I greatly enjoyed meeting [name] and look forward to participating in their care.  A copy of this report was sent to the requesting provider on this date.  Electronically Signed: D. Jeananne Rama, PA-C 06/23/2023, 1:32 PM   I spent a total of  25 minutes   in face to face  in clinical consultation, greater than 50% of which was counseling/coordinating care for port a cath placement

## 2023-06-23 NOTE — Assessment & Plan Note (Signed)
[id number] -Incidental findings on the CT scan for lung cancer screening/follow-up -Diagnosed in September 2024 by EUS baseline CA 19.9 256 -Patient has synchronized 2 hypermetabolic lesion in her lungs, highly suspicious for malignancy. -Patient declined Whipple surgery due to her advanced age and diagnosis of lung cancer at the same time. -Patient agreed with low intensity chemotherapy with single agent gemcitabine and started on June 17, 2023, for a total of 3 to 4 months, followed by consolidation radiation if no cancer progression on next restaging CT.

## 2023-06-23 NOTE — Progress Notes (Signed)
[facility]   [phone] [phone]   Clinic Follow up Note   Patient Care Team: Beam, Chales Salmon, MD as PCP - General (Family Medicine) Corky Crafts, MD as PCP - Cardiology (Cardiology) Loreli Slot, MD as Consulting Physician (Cardiothoracic Surgery) Malachy Mood, MD as Consulting Physician (Oncology) Pollyann Samples, NP as Nurse Practitioner (Nurse Practitioner) Antony Blackbird, MD as Consulting Physician (Radiation Oncology)  Date of Service:  06/23/2023  CHIEF COMPLAINT: f/u of pancreatic cancer  CURRENT THERAPY:  Single agent gemcitabine on day 1 and 8 every 21 days  Oncology History   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 -Patient declined Whipple surgery due to her advanced age and diagnosis of lung cancer at the same time. -Patient agreed with low intensity chemotherapy with single agent gemcitabine and started on June 17, 2023, for a total of 3 to 4 months, followed by consolidation radiation if no cancer progression on next restaging CT.  Adenocarcinoma of left lung, stage 1 (HCC) -She had a history of stage Ia non-small cell lung cancer/adenocarcinoma of left lung, status post lobectomy in September 2019 -On surveillance CT scan in June 2024, she was found to have 7 mm right middle lobe nodule, increased to 12 mm and hypermetabolic on PET scan in September 2024, she also has 2 additional 7 mm lung nodules which were not hypermetabolic on PET scan. -Plan to proceed with SBRT by Dr. Roselind Messier, biopsy was not recommended by Dr. Arbutus Ped      Assessment and Plan    Pancreatic Cancer Tolerating chemotherapy well with no nausea or vomiting. Reports fatigue, which is common with chemotherapy.  Stable weight. No pain. Anemia noted, likely secondary to chemotherapy. -Continue current chemotherapy regimen. -Encourage high protein, high calorie diet and staying active to combat fatigue. -Plan for a break in chemotherapy during radiation treatment for lung cancer. -Rescan in 3-4 months to assess response to chemotherapy. -Consider radiation for pancreatic cancer if scans show positive response to chemotherapy.  Lung Cancer Scheduled for radiation therapy starting next week for two weeks. -Ensure coordination with radiation oncologist (Dr. Kathaleen Grinder or Dr. Mitzi Hansen) for treatment plan.  Plan -Lab reviewed, adequate for treatment, will proceed to cycle 1 day 8 gemcitabine today -Port placement scheduled for tomorrow. -She will start radiation next week, will give chemo break during her radiation -Next appointment on November 4th for cycle 2-day 1 chemotherapy        SUMMARY OF ONCOLOGIC HISTORY: Oncology History  Adenocarcinoma of left lung, stage 1 (HCC)  07/06/2018 Initial Diagnosis   Adenocarcinoma of left lung, stage 1 (HCC)   02/15/2021 Cancer Staging   Staging form: Lung, AJCC 8th Edition - Clinical: Stage IA3 (cT1c, cN0, cM0) - Signed by Si Gaul, MD on 02/15/2021   Adenocarcinoma of head of pancreas (HCC)  03/03/2023 Imaging   CT chest IMPRESSION: 1. Enlarging and concerning right lung nodules, the largest in the right middle lobe measuring up to 1.4 cm in diameter, highly suspicious for primary bronchogenic carcinoma. The right middle lobe nodule is large enough to evaluate by PET-CT which is recommended. 2. No evidence of thoracic adenopathy or pleural effusion. No suspicious left lung nodule status post upper lobe resection. 3. Aortic Atherosclerosis (ICD10-I70.0) and Emphysema (ICD10-J43.9).   03/28/2023 PET scan  IMPRESSION: 1. 12 mm medial right middle lobe pulmonary nodule is hypermetabolic and consistent with primary neoplasm. 2. 7 mm right lower lobe  nodule and 7 mm right upper lobe nodule do not demonstrate any hypermetabolism but will need close surveillance. 3. No mediastinal or hilar lymphadenopathy. 4. No findings for osseous metastatic disease. 5. Abnormal appearance of the pancreatic head and mild hypermetabolism worrisome for neoplasm. Recommend MRI abdomen without and with contrast for further evaluation. Aortic Atherosclerosis (ICD10-I70.0).   05/04/2023 Imaging   ABD MRI IMPRESSION: 1. Suspicious, solid lesion within the head of pancreas 3.0 x 2.4 x 2.4 cm. There is associated main duct dilatation within the head through tail of pancreas. Suspected lesion corresponds to the area of increased tracer uptake noted on the recent PET-CT. Findings are concerning for pancreatic adenocarcinoma. Motion artifact on the postcontrast images diminishes exam detail within the pancreas. Difficult to exclude involvement of the portal vein. The celiac trunk and proximal SMA appear uninvolved. Consider further evaluation with endoscopic ultrasound and possibly CTA of the abdomen. 2. Scattered T2 hyperintense and T1 hypointense foci within the liver are favored to represent multiple cysts. Unfortunately, motion artifact diminishes exam detail within the liver for the evaluation underlying enhancing liver lesions. Within this limitation, no obvious suspicious lesion identified at this time. 3. Right middle lobe lung nodule is again identified as seen on the previous PET-CT.   05/04/2023 Imaging   Brain MRI IMPRESSION: No evidence of intracranial metastatic disease.   05/16/2023 Tumor Marker   CA 19-9: 256   05/22/2023 Cancer Staging   Staging form: Exocrine Pancreas, AJCC 8th Edition - Clinical stage from 05/22/2023: Stage IB (cT2, cN0, cM0) - Signed by Pollyann Samples, NP on 06/09/2023 Stage prefix: Initial diagnosis Total positive nodes: 0   05/22/2023 Procedure   EUS by Dr. Meridee Score: 29 x 28 mm mass in the pancreatic head appearing  suspicious for adeno, no malignant appearing lymph nodes were visualized.  Abuts the gastroduodenal artery but not involved with the SMA or celiac trunk.  T2 N0 by EUS    05/22/2023 Initial Biopsy    A. PANCREAS, HEAD LESION, FINE NEEDLE ASPIRATION:  FINAL MICROSCOPIC DIAGNOSIS:  - Adenocarcinoma    06/09/2023 Initial Diagnosis   Adenocarcinoma of head of pancreas (HCC)   06/17/2023 -  Chemotherapy   Patient is on Treatment Plan : LUNG Gemcitabine (1000) D1,8 q21d        Discussed the use of AI scribe software for clinical note transcription with the patient, who gave verbal consent to proceed.  History of Present Illness   The patient, an 82 year old with pancreatic cancer, presents for follow-up after her first round of chemotherapy. She reports tolerating the treatment well, with no nausea or other adverse reactions. She has been able to maintain her weight and continue to care for herself at home. However, she notes increased fatigue, which she attributes both to the chemotherapy and her age. She denies any pain or other symptoms. She is scheduled for a break from chemotherapy next week, after which she will resume treatment for another two weeks. She understands that fatigue is a common side effect of chemotherapy and plans to eat a high-protein, high-calorie diet and stay active to manage this. She is also aware of the plan for a scan after three to four months of chemotherapy, followed by possible radiation therapy. She expresses a willingness to undergo radiation therapy if recommended. She is also scheduled for radiation therapy for a lung condition, which  will take place over the next two weeks. She reports no need for medication refills and is scheduled for port placement tomorrow.         All other systems were reviewed with the patient and are negative.  MEDICAL HISTORY:  Past Medical History:  Diagnosis Date   Anxiety    Blood glucose elevated 05/27/2012   pt. 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/2015   Overview:  Dr Elnoria Howard  Last Assessment & Plan:  Relevant Hx: Course: Daily Update: Today's Plan:    GERD (gastroesophageal reflux disease)    Hemorrhoid 12/06/2015   History of hiatal hernia    HLD (hyperlipidemia) 12/03/2011   Hypertension    Lung cancer (HCC) dx'd 04/2018   Osteopenia 05/30/2015   Overview:  Bone density 05/2015 started fosamax    Primary localized osteoarthritis of left knee    Primary localized osteoarthritis of right knee 12/06/2015    SURGICAL HISTORY: Past Surgical History:  Procedure Laterality Date   ABDOMINAL HYSTERECTOMY     BIOPSY  05/22/2023   Procedure: BIOPSY;  Surgeon: Lemar Lofty., MD;  Location: [facility];  Service: Gastroenterology;;   CATARACT EXTRACTION, BILATERAL     COLONOSCOPY     ESOPHAGOGASTRODUODENOSCOPY (EGD) WITH PROPOFOL N/A 05/22/2023   Procedure: ESOPHAGOGASTRODUODENOSCOPY (EGD) WITH PROPOFOL;  Surgeon: Lemar Lofty., MD;  Location: [facility];  Service: Gastroenterology;  Laterality: N/A;   EUS N/A 05/22/2023   Procedure: UPPER ENDOSCOPIC ULTRASOUND (EUS) RADIAL;  Surgeon: Lemar Lofty., MD;  Location: [facility];  Service: Gastroenterology;  Laterality: N/A;   EYE SURGERY Bilateral    Cataract   FINE NEEDLE ASPIRATION  05/22/2023   Procedure: FINE NEEDLE ASPIRATION;  Surgeon: Lemar Lofty., MD;  Location: [facility];  Service: Gastroenterology;;   implantable loop recorder placement  10/26/2018   MDT LINQ Reveal XT ILR implanted for evaluation of palpitations and atrial fibrillation in office by Dr Johney Frame, Device SN [id number])    KNEE SURGERY  05/01/2016    left uncompartmental    PARTIAL KNEE ARTHROPLASTY Right 12/18/2015   Procedure: UNICOMPARTMENTAL RIGHT KNEE;  Surgeon: Salvatore Marvel, MD;  Location: [facility] OR;  Service: Orthopedics;  Laterality: Right;   PARTIAL KNEE ARTHROPLASTY Left 05/01/2016   Procedure: UNICOMPARTMENTAL KNEE;  Surgeon: Salvatore Marvel, MD;  Location: [facility] OR;  Service: Orthopedics;  Laterality: Left;   POLYPECTOMY  05/22/2023   Procedure: POLYPECTOMY;  Surgeon: Mansouraty, Netty Starring., MD;  Location: [facility];  Service: Gastroenterology;;   TOTAL ABDOMINAL HYSTERECTOMY W/ BILATERAL SALPINGOOPHORECTOMY     VIDEO ASSISTED THORACOSCOPY (VATS)/ LOBECTOMY Left 05/13/2018   Procedure: VIDEO ASSISTED THORACOSCOPY (VATS)/LEFT UPPER LOBECTOMY;  Surgeon: Loreli Slot, MD;  Location: [facility] OR;  Service: Thoracic;  Laterality: Left;    I have reviewed the social history and family history with the patient and they are unchanged from previous note.  ALLERGIES:  is allergic to [employer] tartrate], sulfa antibiotics, codeine, hydrocodone, and meperidine.  MEDICATIONS:  Current Outpatient Medications  Medication Sig Dispense Refill   aspirin EC 81 MG tablet Take 1 tablet (81 mg total) by mouth daily. Swallow whole. (Patient not taking: Reported on  06/09/2023) 90 tablet 3   Calcium Carb-Cholecalciferol (CALCIUM-VITAMIN D3) 600-200 MG-UNIT TABS Take by mouth daily.     cetirizine (ZYRTEC) 10 MG tablet Take 10 mg by mouth as needed for allergies.     diphenhydramine-acetaminophen (TYLENOL PM) 25-500 MG TABS tablet Take 2 tablets by mouth at bedtime as needed (for sleep).     escitalopram (LEXAPRO) 20 MG tablet Take by mouth daily.     lidocaine-prilocaine (EMLA) cream Apply to affected area once 30 g 3   metoprolol succinate (TOPROL-XL) 50 MG 24 hr tablet Take 50 mg by mouth daily. Take with or immediately following a meal.     Multiple Vitamins-Minerals (MULTIVITAMIN PO) Take 1 tablet by mouth daily.     ondansetron (ZOFRAN) 8 MG  tablet Take 1 tablet (8 mg total) by mouth every 8 (eight) hours as needed for nausea or vomiting. 30 tablet 1   pantoprazole (PROTONIX) 40 MG tablet Take 40 mg by mouth daily.     polyethylene glycol (MIRALAX / GLYCOLAX) packet Take 8.5 g by mouth every other day.     prochlorperazine (COMPAZINE) 10 MG tablet Take 1 tablet (10 mg total) by mouth every 6 (six) hours as needed for nausea or vomiting. 30 tablet 1   simvastatin (ZOCOR) 20 MG tablet Take 20 mg by mouth daily.     No current facility-administered medications for this visit.    PHYSICAL EXAMINATION: ECOG PERFORMANCE STATUS: 1 - Symptomatic but completely ambulatory  Vitals:   06/23/23 0935  BP: (!) 142/69  Pulse: (!) 57  Resp: 18  Temp: 97.8 F (36.6 C)  SpO2: 99%   Wt Readings from Last 3 Encounters:  06/23/23 146 lb 6.4 oz (66.4 kg)  06/17/23 145 lb 8 oz (66 kg)  06/09/23 144 lb 11.2 oz (65.6 kg)     GENERAL:alert, no distress and comfortable SKIN: skin color, texture, turgor are normal, no rashes or significant lesions EYES: normal, Conjunctiva are pink and non-injected, sclera clear NECK: supple, thyroid normal size, non-tender, without nodularity LYMPH:  no palpable lymphadenopathy in the cervical, axillary  LUNGS: clear to auscultation and percussion with normal breathing effort HEART: regular rate & rhythm and no murmurs and no lower extremity edema ABDOMEN:abdomen soft, non-tender and normal bowel sounds Musculoskeletal:no cyanosis of digits and no clubbing  NEURO: alert & oriented x 3 with fluent speech, no focal motor/sensory deficits        LABORATORY DATA:  I have reviewed the data as listed    Latest Ref Rng & Units 06/23/2023    9:05 AM 06/17/2023    2:24 PM 06/03/2023   11:18 AM  CBC  WBC 4.0 - 10.5 K/uL 4.8  8.7  8.6   Hemoglobin 12.0 - 15.0 g/dL 96.0  45.4  09.8   Hematocrit 36.0 - 46.0 % 33.5  39.8  36.8   Platelets 150 - 400 K/uL 160  210  229         Latest Ref Rng & Units  06/23/2023    9:05 AM 06/17/2023    2:24 PM 06/03/2023   11:18 AM  CMP  Glucose 70 - 99 mg/dL 119  147  829   BUN 8 - 23 mg/dL 18  18  17    Creatinine 0.44 - 1.00 mg/dL 5.62  1.30  8.65   Sodium 135 - 145 mmol/L 135  136  136   Potassium 3.5 - 5.1 mmol/L 4.3  3.5  4.6   Chloride 98 - 111 mmol/L 102  101  103   CO2 22 - 32 mmol/L 27  27  29    Calcium 8.9 - 10.3 mg/dL 9.7  9.3  9.3   Total Protein 6.5 - 8.1 g/dL 6.3  6.8  6.5   Total Bilirubin 0.3 - 1.2 mg/dL 0.6  0.6  0.5   Alkaline Phos 38 - 126 U/L 64  67  62   AST 15 - 41 U/L 15  14  17    ALT 0 - 44 U/L 7  7  9        RADIOGRAPHIC STUDIES: I have personally reviewed the radiological images as listed and agreed with the findings in the report. No results found.    No orders of the defined types were placed in this encounter.  All questions were answered. The patient knows to call the clinic with any problems, questions or concerns. No barriers to learning was detected. The total time spent in the appointment was 25 minutes.     Malachy Mood, MD 06/23/2023

## 2023-06-23 NOTE — Patient Instructions (Signed)
[facility]  Discharge Instructions: Thank you for choosing [facility] to provide your oncology and hematology care.   If you have a lab appointment with the Cancer Center, please go directly to the Cancer Center and check in at the registration area.   Wear comfortable clothing and clothing appropriate for easy access to any Portacath or PICC line.   We strive to give you quality time with your provider. You may need to reschedule your appointment if you arrive late (15 or more minutes).  Arriving late affects you and other patients whose appointments are after yours.  Also, if you miss three or more appointments without notifying the office, you may be dismissed from the clinic at the provider's discretion.      For prescription refill requests, have your pharmacy contact our office and allow 72 hours for refills to be completed.    Today you received the following chemotherapy and/or immunotherapy agents: Gemcitabine      To help prevent nausea and vomiting after your treatment, we encourage you to take your nausea medication as directed.  BELOW ARE SYMPTOMS THAT SHOULD BE REPORTED IMMEDIATELY: *FEVER GREATER THAN 100.4 F (38 C) OR HIGHER *CHILLS OR SWEATING *NAUSEA AND VOMITING THAT IS NOT CONTROLLED WITH YOUR NAUSEA MEDICATION *UNUSUAL SHORTNESS OF BREATH *UNUSUAL BRUISING OR BLEEDING *URINARY PROBLEMS (pain or burning when urinating, or frequent urination) *BOWEL PROBLEMS (unusual diarrhea, constipation, pain near the anus) TENDERNESS IN MOUTH AND THROAT WITH OR WITHOUT PRESENCE OF ULCERS (sore throat, sores in mouth, or a toothache) UNUSUAL RASH, SWELLING OR PAIN  UNUSUAL VAGINAL DISCHARGE OR ITCHING   Items with * indicate a potential emergency and should be followed up as soon as possible or go to the Emergency Department if any problems should occur.  Please show the CHEMOTHERAPY ALERT CARD or IMMUNOTHERAPY ALERT CARD at  check-in to the Emergency Department and triage nurse.  Should you have questions after your visit or need to cancel or reschedule your appointment, please contact [facility]  Dept: [phone]  and follow the prompts.  Office hours are 8:00 a.m. to 4:30 p.m. Monday - Friday. Please note that voicemails left after 4:00 p.m. may not be returned until the following business day.  We are closed weekends and major holidays. You have access to a nurse at all times for urgent questions. Please call the main number to the clinic Dept: [phone] and follow the prompts.   For any non-urgent questions, you may also contact your provider using MyChart. We now offer e-Visits for anyone 76 and older to request care online for non-urgent symptoms. For details visit [email or web address].   Also download the MyChart app! Go to the app store, search "MyChart", open the app, select [facility], and log in with your MyChart username and password.

## 2023-06-23 NOTE — Assessment & Plan Note (Signed)
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 -Plan to proceed with SBRT by Dr. Roselind Messier, biopsy was not recommended by Dr. Arbutus Ped

## 2023-06-24 ENCOUNTER — Ambulatory Visit (HOSPITAL_COMMUNITY)
Admission: RE | Admit: 2023-06-24 | Discharge: 2023-06-24 | Disposition: A | Discharge status: Home / Self Care | Payer: Medicare Other | Source: Ambulatory Visit | Attending: Nurse Practitioner | Admitting: Nurse Practitioner

## 2023-06-24 ENCOUNTER — Encounter (HOSPITAL_COMMUNITY): Payer: Self-pay

## 2023-06-24 ENCOUNTER — Telehealth: Payer: Self-pay | Admitting: Genetic Counselor

## 2023-06-24 ENCOUNTER — Encounter: Payer: Self-pay | Admitting: Genetic Counselor

## 2023-06-24 ENCOUNTER — Other Ambulatory Visit: Payer: Self-pay

## 2023-06-24 DIAGNOSIS — C259 Malignant neoplasm of pancreas, unspecified: Secondary | ICD-10-CM | POA: Insufficient documentation

## 2023-06-24 DIAGNOSIS — Z1379 Encounter for other screening for genetic and chromosomal anomalies: Secondary | ICD-10-CM | POA: Insufficient documentation

## 2023-06-24 DIAGNOSIS — F419 Anxiety disorder, unspecified: Secondary | ICD-10-CM | POA: Insufficient documentation

## 2023-06-24 DIAGNOSIS — Z85118 Personal history of other malignant neoplasm of bronchus and lung: Secondary | ICD-10-CM | POA: Diagnosis not present

## 2023-06-24 DIAGNOSIS — I10 Essential (primary) hypertension: Secondary | ICD-10-CM | POA: Diagnosis not present

## 2023-06-24 DIAGNOSIS — Z87891 Personal history of nicotine dependence: Secondary | ICD-10-CM | POA: Diagnosis not present

## 2023-06-24 DIAGNOSIS — Z902 Acquired absence of lung [part of]: Secondary | ICD-10-CM | POA: Diagnosis not present

## 2023-06-24 DIAGNOSIS — K449 Diaphragmatic hernia without obstruction or gangrene: Secondary | ICD-10-CM | POA: Insufficient documentation

## 2023-06-24 DIAGNOSIS — K219 Gastro-esophageal reflux disease without esophagitis: Secondary | ICD-10-CM | POA: Insufficient documentation

## 2023-06-24 DIAGNOSIS — E785 Hyperlipidemia, unspecified: Secondary | ICD-10-CM | POA: Diagnosis not present

## 2023-06-24 DIAGNOSIS — C250 Malignant neoplasm of head of pancreas: Secondary | ICD-10-CM

## 2023-06-24 HISTORY — PX: IR IMAGING GUIDED PORT INSERTION: IMG5740

## 2023-06-24 MED ORDER — LIDOCAINE-EPINEPHRINE 1 %-1:100000 IJ SOLN
20.0000 mL | Freq: Once | INTRAMUSCULAR | Status: AC
  Administered 2023-06-24: 15 mL via INTRADERMAL

## 2023-06-24 MED ORDER — MIDAZOLAM HCL 2 MG/2ML IJ SOLN
INTRAMUSCULAR | Status: AC | PRN
Start: 2023-06-24 — End: 2023-06-24
  Administered 2023-06-24: 1 mg via INTRAVENOUS

## 2023-06-24 MED ORDER — MIDAZOLAM HCL 2 MG/2ML IJ SOLN
INTRAMUSCULAR | Status: AC | PRN
Start: 2023-06-24 — End: 2023-06-24
  Administered 2023-06-24: .5 mg via INTRAVENOUS

## 2023-06-24 MED ORDER — LIDOCAINE-EPINEPHRINE 1 %-1:100000 IJ SOLN
INTRAMUSCULAR | Status: AC
  Filled 2023-06-24: qty 1

## 2023-06-24 MED ORDER — FENTANYL CITRATE (PF) 100 MCG/2ML IJ SOLN
INTRAMUSCULAR | Status: AC | PRN
Start: 2023-06-24 — End: 2023-06-24
  Administered 2023-06-24: 25 ug via INTRAVENOUS

## 2023-06-24 MED ORDER — HEPARIN SOD (PORK) LOCK FLUSH 100 UNIT/ML IV SOLN
500.0000 [IU] | Freq: Once | INTRAVENOUS | Status: AC
  Administered 2023-06-24: 500 [IU] via INTRAVENOUS

## 2023-06-24 MED ORDER — MIDAZOLAM HCL 2 MG/2ML IJ SOLN
INTRAMUSCULAR | Status: AC
  Filled 2023-06-24: qty 2

## 2023-06-24 MED ORDER — HEPARIN SOD (PORK) LOCK FLUSH 100 UNIT/ML IV SOLN
INTRAVENOUS | Status: AC
  Filled 2023-06-24: qty 5

## 2023-06-24 MED ORDER — FENTANYL CITRATE (PF) 100 MCG/2ML IJ SOLN
INTRAMUSCULAR | Status: AC
  Filled 2023-06-24: qty 2

## 2023-06-24 MED ORDER — FENTANYL CITRATE (PF) 100 MCG/2ML IJ SOLN
INTRAMUSCULAR | Status: AC | PRN
Start: 2023-06-24 — End: 2023-06-24
  Administered 2023-06-24: 50 ug via INTRAVENOUS

## 2023-06-24 MED ORDER — SODIUM CHLORIDE 0.9 % IV SOLN: INTRAVENOUS | Status: DC

## 2023-06-24 NOTE — Procedures (Signed)
Interventional Radiology Procedure Note  Procedure: RT internal jugular POWER PORT    Complications: None  Estimated Blood Loss:  MIN  Findings: TIP SVCRA    Sharen Counter, MD

## 2023-06-24 NOTE — Discharge Instructions (Signed)
Discharge Instructions:   Please call Interventional Radiology clinic [phone] with any questions or concerns.  You may remove your dressing and shower tomorrow.  Do not use EMLA / Lidocaine cream for 2 weeks post Port Insertion this will remove the surgical glue.   Implanted Port Insertion, Care After The following information offers guidance on how to care for yourself after your procedure. Your health care provider may also give you more specific instructions. If you have problems or questions, contact your health care provider. What can I expect after the procedure? After the procedure, it is common to have: Discomfort at the port insertion site. Bruising on the skin over the port. This should improve over 3-4 days. Follow these instructions at home: [facility] care After your port is placed, you will get a manufacturer's information card. The card has information about your port. Keep this card with you at all times. Take care of the port as told by your health care provider. Ask your health care provider if you or a family member can get training for taking care of the port at home. A home health care nurse will be be available to help care for the port. Make sure to remember what type of port you have. Incision care     Follow instructions from your health care provider about how to take care of your port insertion site. Make sure you: Wash your hands with soap and water for at least 20 seconds before and after you change your bandage (dressing). If soap and water are not available, use hand sanitizer. Change your dressing as told by your health care provider. Leave stitches (sutures), skin glue, or adhesive strips in place. These skin closures may need to stay in place for 2 weeks or longer. If adhesive strip edges start to loosen and curl up, you may trim the loose edges. Do not remove adhesive strips completely unless your health care provider tells you to do that. Check your port  insertion site every day for signs of infection. Check for: Redness, swelling, or pain. Fluid or blood. Warmth. Pus or a bad smell. Activity Return to your normal activities as told by your health care provider. Ask your health care provider what activities are safe for you. You may have to avoid lifting. Ask your health care provider how much you can safely lift. General instructions Take over-the-counter and prescription medicines only as told by your health care provider. Do not take baths, swim, or use a hot tub until your health care provider approves. Ask your health care provider if you may take showers. You may only be allowed to take sponge baths. If you were given a sedative during the procedure, it can affect you for several hours. Do not drive or operate machinery until your health care provider says that it is safe. Wear a medical alert bracelet in case of an emergency. This will tell any health care providers that you have a port. Keep all follow-up visits. This is important. Contact a health care provider if: You cannot flush your port with saline as directed, or you cannot draw blood from the port. You have a fever or chills. You have redness, swelling, or pain around your port insertion site. You have fluid or blood coming from your port insertion site. Your port insertion site feels warm to the touch. You have pus or a bad smell coming from the port insertion site. Get help right away if: You have chest pain or shortness of  breath. You have bleeding from your port that you cannot control. These symptoms may be an emergency. Get help right away. Call 911. Do not wait to see if the symptoms will go away. Do not drive yourself to the hospital. Summary Take care of the port as told by your health care provider. Keep the manufacturer's information card with you at all times. Change your dressing as told by your health care provider. Contact a health care provider if you  have a fever or chills or if you have redness, swelling, or pain around your port insertion site. Keep all follow-up visits. This information is not intended to replace advice given to you by your health care provider. Make sure you discuss any questions you have with your health care provider. Document Revised: 02/27/2021 Document Reviewed: 02/27/2021 Elsevier Patient Education  2023 [employer]   Moderate Conscious Sedation, Adult, Care After This sheet gives you information about how to care for yourself after your procedure. Your health care provider may also give you more specific instructions. If you have problems or questions, contact your health care provider. What can I expect after the procedure? After the procedure, it is common to have: Sleepiness for several hours. Impaired judgment for several hours. Difficulty with balance. Vomiting if you eat too soon. Follow these instructions at home: For the time period you were told by your health care provider: Rest. Do not participate in activities where you could fall or become injured. Do not drive or use machinery. Do not drink alcohol. Do not take sleeping pills or medicines that cause drowsiness. Do not make important decisions or sign legal documents. Do not take care of children on your own. Eating and drinking  Follow the diet recommended by your health care provider. Drink enough fluid to keep your urine pale yellow. If you vomit: Drink water, juice, or soup when you can drink without vomiting. Make sure you have little or no nausea before eating solid foods. General instructions Take over-the-counter and prescription medicines only as told by your health care provider. Have a responsible adult stay with you for the time you are told. It is important to have someone help care for you until you are awake and alert. Do not smoke. Keep all follow-up visits as told by your health care provider. This is  important. Contact a health care provider if: You are still sleepy or having trouble with balance after 24 hours. You feel light-headed. You keep feeling nauseous or you keep vomiting. You develop a rash. You have a fever. You have redness or swelling around the IV site. Get help right away if: You have trouble breathing. You have new-onset confusion at home. Summary After the procedure, it is common to feel sleepy, have impaired judgment, or feel nauseous if you eat too soon. Rest after you get home. Know the things you should not do after the procedure. Follow the diet recommended by your health care provider and drink enough fluid to keep your urine pale yellow. Get help right away if you have trouble breathing or new-onset confusion at home. This information is not intended to replace advice given to you by your health care provider. Make sure you discuss any questions you have with your health care provider. Document Revised: 12/24/2019 Document Reviewed: 07/22/2019 Elsevier Patient Education  2023 [employer].

## 2023-06-24 NOTE — Telephone Encounter (Signed)
Hereditary cancer genetic testing through [facility] was Negative. No pathogenic variants were identified in the 71 genes analyzed. We will message Dr. Mosetta Putt on 07/14/2023 to request she disclose the results to [name] during their appointment.   Once results are disclosed to the patient, test report will be scanned into EPIC and located under the Molecular Pathology section of the Results Review tab.  A portion of the result report is included below for reference.   Lalla Brothers, MS, [facility] Genetic Counselor [email or web address] [phone]

## 2023-06-24 NOTE — Progress Notes (Signed)
Ice bag sent home to use prn for comfort to her upper right  neck and upper right chest.

## 2023-06-25 ENCOUNTER — Other Ambulatory Visit: Payer: Self-pay

## 2023-06-25 NOTE — Progress Notes (Signed)
The proposed treatment discussed in conference is for discussion purpose only and is not a binding recommendation.  The patients have not been physically examined, or presented with their treatment options.  Therefore, final treatment plans cannot be decided.  

## 2023-06-27 ENCOUNTER — Telehealth: Payer: Self-pay

## 2023-06-27 ENCOUNTER — Telehealth: Payer: Self-pay | Admitting: *Deleted

## 2023-06-27 NOTE — Telephone Encounter (Signed)
-----   Message from [facility] sent at 06/27/2023  4:09 AM EDT ----- [name], Place this patient on for EUS in approximately 3 months for fiducial placement. Thanks. GM ----- Message ----- From: Antony Blackbird, MD Sent: 06/25/2023   4:41 PM EDT To: Malachy Mood, MD; Lemar Lofty., MD  Thanks Terrace Arabia.   Mendel Ryder ----- Message ----- From: Malachy Mood, MD Sent: 06/24/2023   5:57 PM EDT To: Antony Blackbird, MD; Lemar Lofty., MD  I will keep [name] in the loop for possible fiducial placement in about 3 months. He knows the pt already. His schedule gets full quickly and we need to plan ahead.   Thanks  Terrace Arabia ----- Message ----- From: Antony Blackbird, MD Sent: 06/24/2023   5:26 PM EDT To: Malachy Mood, MD  Yes,  I will need fiducial placement but maybe we should wait  to make sure she does not develop metastatic dz on chemo.  Thanks, jk ----- Message ----- From: Malachy Mood, MD Sent: 06/24/2023   7:11 AM EDT To: Antony Blackbird, MD  [facility], do you need fiducial placement?  Terrace Arabia ----- Message ----- From: Antony Blackbird, MD Sent: 06/23/2023   7:37 PM EDT To: Malachy Mood, MD  I can see her for the pancreas treatment.  Thanks for checking.   [name] ----- Message ----- From: Malachy Mood, MD Sent: 06/23/2023   1:14 PM EDT To: Antony Blackbird, MD  [name],  She will have chemo break for next three weeks during her RT to lung. I plan to give her 3 to 4 months chemotherapy for her pancreatic cancer, if no progression on next scan, then proceed with consolidation radiation.  Do you plan to treat her pancreatic cancer also?  Or do you want John to see her?  Thanks   Terrace Arabia

## 2023-06-27 NOTE — Telephone Encounter (Signed)
Recall has been entered as ordered  

## 2023-06-27 NOTE — Telephone Encounter (Signed)
Patient called to report she broke out into a rash on her abdomen following her port placement. It has mostly resolved at this time. She has a little residual that continues to itch. Advised patient to try benadryl 25mg . Patient reports she has not tried anything to help with the itching at all and it is resolving on its own. Patient encouraged to notify office sooner and continue to monitor rash area. She denies any other signs of allergic reaction.

## 2023-06-30 ENCOUNTER — Ambulatory Visit
Admission: RE | Admit: 2023-06-30 | Discharge: 2023-06-30 | Disposition: A | Discharge status: Home / Self Care | Payer: Medicare Other | Source: Ambulatory Visit | Attending: Radiation Oncology | Admitting: Radiation Oncology

## 2023-06-30 ENCOUNTER — Other Ambulatory Visit: Payer: Self-pay

## 2023-06-30 DIAGNOSIS — Z510 Encounter for antineoplastic radiation therapy: Secondary | ICD-10-CM | POA: Diagnosis not present

## 2023-06-30 DIAGNOSIS — C3492 Malignant neoplasm of unspecified part of left bronchus or lung: Secondary | ICD-10-CM

## 2023-06-30 LAB — RAD ONC ARIA SESSION SUMMARY
Course Elapsed Days: 0
Plan Fractions Treated to Date: 1
Plan Prescribed Dose Per Fraction: 5 Gy
Plan Total Fractions Prescribed: 10
Plan Total Prescribed Dose: 50 Gy
Reference Point Dosage Given to Date: 5 Gy
Reference Point Session Dosage Given: 5 Gy
Session Number: 1

## 2023-07-01 ENCOUNTER — Ambulatory Visit
Admission: RE | Admit: 2023-07-01 | Discharge: 2023-07-01 | Disposition: A | Discharge status: Home / Self Care | Payer: Medicare Other | Source: Ambulatory Visit | Attending: Radiation Oncology | Admitting: Radiation Oncology

## 2023-07-01 ENCOUNTER — Other Ambulatory Visit: Payer: Self-pay

## 2023-07-01 ENCOUNTER — Ambulatory Visit: Admission: RE | Admit: 2023-07-01 | Payer: Medicare Other | Source: Ambulatory Visit | Admitting: Radiation Oncology

## 2023-07-01 DIAGNOSIS — Z510 Encounter for antineoplastic radiation therapy: Secondary | ICD-10-CM | POA: Diagnosis not present

## 2023-07-01 DIAGNOSIS — C3492 Malignant neoplasm of unspecified part of left bronchus or lung: Secondary | ICD-10-CM

## 2023-07-01 LAB — RAD ONC ARIA SESSION SUMMARY
Course Elapsed Days: 1
Plan Fractions Treated to Date: 1
Plan Fractions Treated to Date: 1
Plan Fractions Treated to Date: 2
Plan Prescribed Dose Per Fraction: 18 Gy
Plan Prescribed Dose Per Fraction: 18 Gy
Plan Prescribed Dose Per Fraction: 5 Gy
Plan Total Fractions Prescribed: 10
Plan Total Fractions Prescribed: 3
Plan Total Fractions Prescribed: 3
Plan Total Prescribed Dose: 50 Gy
Plan Total Prescribed Dose: 54 Gy
Plan Total Prescribed Dose: 54 Gy
Reference Point Dosage Given to Date: 10 Gy
Reference Point Dosage Given to Date: 18 Gy
Reference Point Dosage Given to Date: 18 Gy
Reference Point Session Dosage Given: 18 Gy
Reference Point Session Dosage Given: 18 Gy
Reference Point Session Dosage Given: 5 Gy
Session Number: 2

## 2023-07-02 ENCOUNTER — Other Ambulatory Visit: Payer: Self-pay

## 2023-07-02 ENCOUNTER — Ambulatory Visit: Payer: Medicare Other | Admitting: Radiation Oncology

## 2023-07-02 ENCOUNTER — Ambulatory Visit
Admission: RE | Admit: 2023-07-02 | Discharge: 2023-07-02 | Disposition: A | Discharge status: Home / Self Care | Payer: Medicare Other | Source: Ambulatory Visit | Attending: Radiation Oncology | Admitting: Radiation Oncology

## 2023-07-02 DIAGNOSIS — Z510 Encounter for antineoplastic radiation therapy: Secondary | ICD-10-CM | POA: Insufficient documentation

## 2023-07-02 DIAGNOSIS — C342 Malignant neoplasm of middle lobe, bronchus or lung: Secondary | ICD-10-CM | POA: Insufficient documentation

## 2023-07-02 LAB — RAD ONC ARIA SESSION SUMMARY
Course Elapsed Days: 2
Plan Fractions Treated to Date: 3
Plan Prescribed Dose Per Fraction: 5 Gy
Plan Total Fractions Prescribed: 10
Plan Total Prescribed Dose: 50 Gy
Reference Point Dosage Given to Date: 15 Gy
Reference Point Session Dosage Given: 5 Gy
Session Number: 3

## 2023-07-03 ENCOUNTER — Ambulatory Visit: Payer: Medicare Other

## 2023-07-03 ENCOUNTER — Ambulatory Visit: Payer: Medicare Other | Admitting: Radiation Oncology

## 2023-07-04 ENCOUNTER — Other Ambulatory Visit: Payer: Self-pay

## 2023-07-04 ENCOUNTER — Ambulatory Visit
Admission: RE | Admit: 2023-07-04 | Discharge: 2023-07-04 | Disposition: A | Discharge status: Home / Self Care | Payer: Medicare Other | Source: Ambulatory Visit | Attending: Radiation Oncology | Admitting: Radiation Oncology

## 2023-07-04 ENCOUNTER — Ambulatory Visit: Payer: Medicare Other | Admitting: Radiation Oncology

## 2023-07-04 DIAGNOSIS — Z510 Encounter for antineoplastic radiation therapy: Secondary | ICD-10-CM | POA: Diagnosis not present

## 2023-07-04 LAB — RAD ONC ARIA SESSION SUMMARY
Course Elapsed Days: 4
Plan Fractions Treated to Date: 4
Plan Prescribed Dose Per Fraction: 5 Gy
Plan Total Fractions Prescribed: 10
Plan Total Prescribed Dose: 50 Gy
Reference Point Dosage Given to Date: 20 Gy
Reference Point Session Dosage Given: 5 Gy
Session Number: 4

## 2023-07-07 ENCOUNTER — Other Ambulatory Visit: Payer: Medicare Other

## 2023-07-07 ENCOUNTER — Ambulatory Visit: Payer: Medicare Other | Admitting: Radiation Oncology

## 2023-07-07 ENCOUNTER — Ambulatory Visit: Payer: Medicare Other

## 2023-07-07 ENCOUNTER — Ambulatory Visit: Payer: Medicare Other | Admitting: Hematology

## 2023-07-07 ENCOUNTER — Other Ambulatory Visit: Payer: Self-pay

## 2023-07-07 ENCOUNTER — Ambulatory Visit
Admission: RE | Admit: 2023-07-07 | Discharge: 2023-07-07 | Disposition: A | Discharge status: Home / Self Care | Payer: Medicare Other | Source: Ambulatory Visit | Attending: Radiation Oncology | Admitting: Radiation Oncology

## 2023-07-07 DIAGNOSIS — C3492 Malignant neoplasm of unspecified part of left bronchus or lung: Secondary | ICD-10-CM

## 2023-07-07 DIAGNOSIS — Z510 Encounter for antineoplastic radiation therapy: Secondary | ICD-10-CM | POA: Diagnosis not present

## 2023-07-07 LAB — RAD ONC ARIA SESSION SUMMARY
Course Elapsed Days: 7
Plan Fractions Treated to Date: 2
Plan Fractions Treated to Date: 2
Plan Fractions Treated to Date: 5
Plan Prescribed Dose Per Fraction: 18 Gy
Plan Prescribed Dose Per Fraction: 18 Gy
Plan Prescribed Dose Per Fraction: 5 Gy
Plan Total Fractions Prescribed: 10
Plan Total Fractions Prescribed: 3
Plan Total Fractions Prescribed: 3
Plan Total Prescribed Dose: 50 Gy
Plan Total Prescribed Dose: 54 Gy
Plan Total Prescribed Dose: 54 Gy
Reference Point Dosage Given to Date: 25 Gy
Reference Point Dosage Given to Date: 36 Gy
Reference Point Dosage Given to Date: 36 Gy
Reference Point Session Dosage Given: 18 Gy
Reference Point Session Dosage Given: 18 Gy
Reference Point Session Dosage Given: 5 Gy
Session Number: 5

## 2023-07-08 ENCOUNTER — Ambulatory Visit
Admission: RE | Admit: 2023-07-08 | Discharge: 2023-07-08 | Disposition: A | Discharge status: Home / Self Care | Payer: Medicare Other | Source: Ambulatory Visit | Attending: Radiation Oncology | Admitting: Radiation Oncology

## 2023-07-08 ENCOUNTER — Ambulatory Visit: Payer: Medicare Other | Admitting: Radiation Oncology

## 2023-07-08 ENCOUNTER — Other Ambulatory Visit: Payer: Self-pay

## 2023-07-08 DIAGNOSIS — Z510 Encounter for antineoplastic radiation therapy: Secondary | ICD-10-CM | POA: Diagnosis not present

## 2023-07-08 LAB — RAD ONC ARIA SESSION SUMMARY
Course Elapsed Days: 8
Plan Fractions Treated to Date: 6
Plan Prescribed Dose Per Fraction: 5 Gy
Plan Total Fractions Prescribed: 10
Plan Total Prescribed Dose: 50 Gy
Reference Point Dosage Given to Date: 30 Gy
Reference Point Session Dosage Given: 5 Gy
Session Number: 6

## 2023-07-09 ENCOUNTER — Ambulatory Visit
Admission: RE | Admit: 2023-07-09 | Discharge: 2023-07-09 | Disposition: A | Discharge status: Home / Self Care | Payer: Medicare Other | Source: Ambulatory Visit | Attending: Radiation Oncology | Admitting: Radiation Oncology

## 2023-07-09 ENCOUNTER — Other Ambulatory Visit: Payer: Self-pay

## 2023-07-09 ENCOUNTER — Inpatient Hospital Stay
Admission: RE | Admit: 2023-07-09 | Discharge: 2023-07-09 | Discharge status: Home / Self Care | Payer: Medicare Other | Source: Ambulatory Visit | Attending: Radiation Oncology | Admitting: Radiation Oncology

## 2023-07-09 ENCOUNTER — Ambulatory Visit: Payer: Medicare Other | Admitting: Radiation Oncology

## 2023-07-09 DIAGNOSIS — C3492 Malignant neoplasm of unspecified part of left bronchus or lung: Secondary | ICD-10-CM

## 2023-07-09 DIAGNOSIS — Z510 Encounter for antineoplastic radiation therapy: Secondary | ICD-10-CM | POA: Diagnosis not present

## 2023-07-09 LAB — RAD ONC ARIA SESSION SUMMARY
Course Elapsed Days: 9
Plan Fractions Treated to Date: 3
Plan Fractions Treated to Date: 3
Plan Fractions Treated to Date: 7
Plan Prescribed Dose Per Fraction: 18 Gy
Plan Prescribed Dose Per Fraction: 18 Gy
Plan Prescribed Dose Per Fraction: 5 Gy
Plan Total Fractions Prescribed: 10
Plan Total Fractions Prescribed: 3
Plan Total Fractions Prescribed: 3
Plan Total Prescribed Dose: 50 Gy
Plan Total Prescribed Dose: 54 Gy
Plan Total Prescribed Dose: 54 Gy
Reference Point Dosage Given to Date: 35 Gy
Reference Point Dosage Given to Date: 54 Gy
Reference Point Dosage Given to Date: 54 Gy
Reference Point Session Dosage Given: 18 Gy
Reference Point Session Dosage Given: 18 Gy
Reference Point Session Dosage Given: 5 Gy
Session Number: 7

## 2023-07-10 ENCOUNTER — Ambulatory Visit: Payer: Medicare Other | Admitting: Radiation Oncology

## 2023-07-10 ENCOUNTER — Ambulatory Visit
Admission: RE | Admit: 2023-07-10 | Discharge: 2023-07-10 | Disposition: A | Discharge status: Home / Self Care | Payer: Medicare Other | Source: Ambulatory Visit | Attending: Radiation Oncology | Admitting: Radiation Oncology

## 2023-07-10 ENCOUNTER — Other Ambulatory Visit: Payer: Self-pay

## 2023-07-10 DIAGNOSIS — Z510 Encounter for antineoplastic radiation therapy: Secondary | ICD-10-CM | POA: Diagnosis not present

## 2023-07-10 LAB — RAD ONC ARIA SESSION SUMMARY
Course Elapsed Days: 10
Plan Fractions Treated to Date: 8
Plan Prescribed Dose Per Fraction: 5 Gy
Plan Total Fractions Prescribed: 10
Plan Total Prescribed Dose: 50 Gy
Reference Point Dosage Given to Date: 40 Gy
Reference Point Session Dosage Given: 5 Gy
Session Number: 8

## 2023-07-11 ENCOUNTER — Ambulatory Visit: Payer: Medicare Other

## 2023-07-11 ENCOUNTER — Ambulatory Visit: Payer: Medicare Other | Admitting: Radiation Oncology

## 2023-07-11 ENCOUNTER — Other Ambulatory Visit: Payer: Self-pay

## 2023-07-11 ENCOUNTER — Ambulatory Visit
Admission: RE | Admit: 2023-07-11 | Discharge: 2023-07-11 | Disposition: A | Discharge status: Home / Self Care | Payer: Medicare Other | Source: Ambulatory Visit | Attending: Radiation Oncology | Admitting: Radiation Oncology

## 2023-07-11 DIAGNOSIS — C342 Malignant neoplasm of middle lobe, bronchus or lung: Secondary | ICD-10-CM | POA: Diagnosis present

## 2023-07-11 DIAGNOSIS — Z510 Encounter for antineoplastic radiation therapy: Secondary | ICD-10-CM | POA: Insufficient documentation

## 2023-07-11 LAB — RAD ONC ARIA SESSION SUMMARY
Course Elapsed Days: 11
Plan Fractions Treated to Date: 9
Plan Prescribed Dose Per Fraction: 5 Gy
Plan Total Fractions Prescribed: 10
Plan Total Prescribed Dose: 50 Gy
Reference Point Dosage Given to Date: 45 Gy
Reference Point Session Dosage Given: 5 Gy
Session Number: 9

## 2023-07-13 NOTE — Assessment & Plan Note (Signed)
[id number] -Incidental findings on the CT scan for lung cancer screening/follow-up -Diagnosed in September 2024 by EUS baseline CA 19.9 256 -Patient has synchronized 2 hypermetabolic lesion in her lungs, highly suspicious for malignancy. -Patient declined Whipple surgery due to her advanced age and diagnosis of lung cancer at the same time. -Patient agreed with low intensity chemotherapy with single agent gemcitabine and started on June 17, 2023, for a total of 3 to 4 months, followed by consolidation radiation if no cancer progression on next restaging CT.

## 2023-07-13 NOTE — Assessment & Plan Note (Signed)
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 -pt started SBRT on 06/30/2023

## 2023-07-14 ENCOUNTER — Encounter: Payer: Self-pay | Admitting: Hematology

## 2023-07-14 ENCOUNTER — Ambulatory Visit: Payer: Medicare Other | Admitting: Radiation Oncology

## 2023-07-14 ENCOUNTER — Ambulatory Visit
Admission: RE | Admit: 2023-07-14 | Discharge: 2023-07-14 | Disposition: A | Discharge status: Home / Self Care | Payer: Medicare Other | Source: Ambulatory Visit | Attending: Radiation Oncology | Admitting: Radiation Oncology

## 2023-07-14 ENCOUNTER — Encounter: Payer: Self-pay | Admitting: Genetic Counselor

## 2023-07-14 ENCOUNTER — Inpatient Hospital Stay: Payer: Medicare Other

## 2023-07-14 ENCOUNTER — Inpatient Hospital Stay (HOSPITAL_BASED_OUTPATIENT_CLINIC_OR_DEPARTMENT_OTHER): Payer: Medicare Other | Admitting: Hematology

## 2023-07-14 ENCOUNTER — Other Ambulatory Visit: Payer: Self-pay

## 2023-07-14 ENCOUNTER — Inpatient Hospital Stay: Payer: Medicare Other | Attending: Internal Medicine

## 2023-07-14 VITALS — BP 158/83 | HR 69 | Temp 97.5°F | Resp 17 | Wt 144.0 lb

## 2023-07-14 DIAGNOSIS — Z5111 Encounter for antineoplastic chemotherapy: Secondary | ICD-10-CM | POA: Insufficient documentation

## 2023-07-14 DIAGNOSIS — Z79899 Other long term (current) drug therapy: Secondary | ICD-10-CM | POA: Insufficient documentation

## 2023-07-14 DIAGNOSIS — C250 Malignant neoplasm of head of pancreas: Secondary | ICD-10-CM

## 2023-07-14 DIAGNOSIS — C3492 Malignant neoplasm of unspecified part of left bronchus or lung: Secondary | ICD-10-CM | POA: Insufficient documentation

## 2023-07-14 DIAGNOSIS — Z510 Encounter for antineoplastic radiation therapy: Secondary | ICD-10-CM | POA: Diagnosis not present

## 2023-07-14 DIAGNOSIS — Z95828 Presence of other vascular implants and grafts: Secondary | ICD-10-CM | POA: Insufficient documentation

## 2023-07-14 DIAGNOSIS — Z7982 Long term (current) use of aspirin: Secondary | ICD-10-CM | POA: Insufficient documentation

## 2023-07-14 DIAGNOSIS — Z923 Personal history of irradiation: Secondary | ICD-10-CM | POA: Insufficient documentation

## 2023-07-14 LAB — CBC WITH DIFFERENTIAL (CANCER CENTER ONLY)
Abs Immature Granulocytes: 0.02 10*3/uL (ref 0.00–0.07)
Basophils Absolute: 0 10*3/uL (ref 0.0–0.1)
Basophils Relative: 1 %
Eosinophils Absolute: 0.1 10*3/uL (ref 0.0–0.5)
Eosinophils Relative: 1 %
HCT: 35.8 % — ABNORMAL LOW (ref 36.0–46.0)
Hemoglobin: 11.9 g/dL — ABNORMAL LOW (ref 12.0–15.0)
Immature Granulocytes: 0 %
Lymphocytes Relative: 12 %
Lymphs Abs: 0.6 10*3/uL — ABNORMAL LOW (ref 0.7–4.0)
MCH: 32.8 pg (ref 26.0–34.0)
MCHC: 33.2 g/dL (ref 30.0–36.0)
MCV: 98.6 fL (ref 80.0–100.0)
Monocytes Absolute: 0.6 10*3/uL (ref 0.1–1.0)
Monocytes Relative: 12 %
Neutro Abs: 3.8 10*3/uL (ref 1.7–7.7)
Neutrophils Relative %: 74 %
Platelet Count: 412 10*3/uL — ABNORMAL HIGH (ref 150–400)
RBC: 3.63 MIL/uL — ABNORMAL LOW (ref 3.87–5.11)
RDW: 14.1 % (ref 11.5–15.5)
WBC Count: 5.2 10*3/uL (ref 4.0–10.5)
nRBC: 0 % (ref 0.0–0.2)

## 2023-07-14 LAB — CMP (CANCER CENTER ONLY)
ALT: 9 U/L (ref 0–44)
AST: 15 U/L (ref 15–41)
Albumin: 3.8 g/dL (ref 3.5–5.0)
Alkaline Phosphatase: 70 U/L (ref 38–126)
Anion gap: 5 (ref 5–15)
BUN: 17 mg/dL (ref 8–23)
CO2: 27 mmol/L (ref 22–32)
Calcium: 9.2 mg/dL (ref 8.9–10.3)
Chloride: 103 mmol/L (ref 98–111)
Creatinine: 0.81 mg/dL (ref 0.44–1.00)
GFR, Estimated: >60 mL/min (ref >60)
Glucose, Bld: 103 mg/dL — ABNORMAL HIGH (ref 70–99)
Potassium: 4.3 mmol/L (ref 3.5–5.1)
Sodium: 135 mmol/L (ref 135–145)
Total Bilirubin: 0.4 mg/dL (ref <1.2)
Total Protein: 6.3 g/dL — ABNORMAL LOW (ref 6.5–8.1)

## 2023-07-14 LAB — RAD ONC ARIA SESSION SUMMARY
Course Elapsed Days: 14
Plan Fractions Treated to Date: 10
Plan Prescribed Dose Per Fraction: 5 Gy
Plan Total Fractions Prescribed: 10
Plan Total Prescribed Dose: 50 Gy
Reference Point Dosage Given to Date: 50 Gy
Reference Point Session Dosage Given: 5 Gy
Session Number: 10

## 2023-07-14 MED ORDER — SODIUM CHLORIDE 0.9% FLUSH
10.0000 mL | Freq: Once | INTRAVENOUS | Status: AC
  Administered 2023-07-14: 10 mL

## 2023-07-14 MED ORDER — PROCHLORPERAZINE MALEATE 10 MG PO TABS
10.0000 mg | ORAL_TABLET | Freq: Once | ORAL | Status: AC
Start: 2023-07-14 — End: 2023-07-14
  Administered 2023-07-14: 10 mg via ORAL
  Filled 2023-07-14: qty 1

## 2023-07-14 MED ORDER — SODIUM CHLORIDE 0.9 % IV SOLN
Freq: Once | INTRAVENOUS | Status: AC
Start: 2023-07-14 — End: 2023-07-14

## 2023-07-14 MED ORDER — SODIUM CHLORIDE 0.9 % IV SOLN
1000.0000 mg/m2 | Freq: Once | INTRAVENOUS | Status: AC
Start: 2023-07-14 — End: 2023-07-14
  Administered 2023-07-14: 1672 mg via INTRAVENOUS
  Filled 2023-07-14: qty 43.97

## 2023-07-14 NOTE — Progress Notes (Signed)
[facility]   [phone]) [phone] [phone]   Clinic Follow up Note   Patient Care Team: Beam, Chales Salmon, MD as PCP - General (Family Medicine) Corky Crafts, MD as PCP - Cardiology (Cardiology) Loreli Slot, MD as Consulting Physician (Cardiothoracic Surgery) Malachy Mood, MD as Consulting Physician (Oncology) Pollyann Samples, NP as Nurse Practitioner (Nurse Practitioner) Antony Blackbird, MD as Consulting Physician (Radiation Oncology)  Date of Service:  07/14/2023  CHIEF COMPLAINT: f/u of pancreatic cancer  CURRENT THERAPY:  First-line chemotherapy with gemcitabine  Oncology History   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 -Patient declined Whipple surgery due to her advanced age and diagnosis of lung cancer at the same time. -Patient agreed with low intensity chemotherapy with single agent gemcitabine and started on June 17, 2023, for a total of 3 to 4 months, followed by consolidation radiation if no cancer progression on next restaging CT.  Adenocarcinoma of left lung, stage 1 (HCC) -She had a history of stage Ia non-small cell lung cancer/adenocarcinoma of left lung, status post lobectomy in September 2019 -On surveillance CT scan in June 2024, she was found to have 7 mm right middle lobe nodule, increased to 12 mm and hypermetabolic on PET scan in September 2024, she also has 2 additional 7 mm lung nodules which were not hypermetabolic on PET scan. -pt started SBRT on 06/30/2023    Assessment and Plan    Pancreatic Cancer Completed radiation therapy with mild side effects (metallic taste, fatigue). No significant gastrointestinal symptoms. Currently on third cycle of gemcitabine chemotherapy with  mild post-treatment symptoms. Genetic testing negative for inheritable markers. -Continue gemcitabine chemotherapy as scheduled. -Plan for imaging after two more cycles to assess response. -Consider short course radiation to pancreas after confirming stability of cancer on imaging.  Lung Cancer Completed radiation therapy. Reports cough with clear phlegm, possibly related to sinus drainage. No fever, chills, or shortness of breath. -Continue to monitor symptoms.  Weight Loss Mild weight loss noted, possibly related to decreased appetite. Patient is trying to eat more frequently and consume more protein. -Increase intake of liquid nutrition (Ensure, Boost).  Plan -Lab reviewed, adequate for treatment, will proceed to cycle 2-day 1 gemcitabine today -Lab, follow-up and chemo next week     SUMMARY OF ONCOLOGIC HISTORY: Oncology History  Adenocarcinoma of left lung, stage 1 (HCC)  07/06/2018 Initial Diagnosis   Adenocarcinoma of left lung, stage 1 (HCC)   02/15/2021 Cancer Staging   Staging form: Lung, AJCC 8th Edition - Clinical: Stage IA3 (cT1c, cN0, cM0) - Signed by Si Gaul, MD on 02/15/2021   Adenocarcinoma of head of pancreas (HCC)  03/03/2023 Imaging   CT chest IMPRESSION: 1. Enlarging and concerning right lung nodules, the largest in the right middle lobe measuring up to 1.4 cm in diameter, highly suspicious for primary bronchogenic carcinoma. The right middle lobe nodule is large enough to evaluate by PET-CT which is recommended. 2. No evidence of thoracic adenopathy or pleural effusion. No suspicious left lung nodule status post upper lobe resection. 3. Aortic Atherosclerosis (ICD10-I70.0) and Emphysema (ICD10-J43.9).   03/28/2023 PET scan   IMPRESSION: 1. 12 mm medial right middle lobe pulmonary nodule is hypermetabolic and consistent with primary neoplasm. 2. 7 mm right lower lobe nodule and 7 mm right upper  lobe nodule do not demonstrate any hypermetabolism but  will need close surveillance. 3. No mediastinal or hilar lymphadenopathy. 4. No findings for osseous metastatic disease. 5. Abnormal appearance of the pancreatic head and mild hypermetabolism worrisome for neoplasm. Recommend MRI abdomen without and with contrast for further evaluation. Aortic Atherosclerosis (ICD10-I70.0).   05/04/2023 Imaging   ABD MRI IMPRESSION: 1. Suspicious, solid lesion within the head of pancreas 3.0 x 2.4 x 2.4 cm. There is associated main duct dilatation within the head through tail of pancreas. Suspected lesion corresponds to the area of increased tracer uptake noted on the recent PET-CT. Findings are concerning for pancreatic adenocarcinoma. Motion artifact on the postcontrast images diminishes exam detail within the pancreas. Difficult to exclude involvement of the portal vein. The celiac trunk and proximal SMA appear uninvolved. Consider further evaluation with endoscopic ultrasound and possibly CTA of the abdomen. 2. Scattered T2 hyperintense and T1 hypointense foci within the liver are favored to represent multiple cysts. Unfortunately, motion artifact diminishes exam detail within the liver for the evaluation underlying enhancing liver lesions. Within this limitation, no obvious suspicious lesion identified at this time. 3. Right middle lobe lung nodule is again identified as seen on the previous PET-CT.   05/04/2023 Imaging   Brain MRI IMPRESSION: No evidence of intracranial metastatic disease.   05/16/2023 Tumor Marker   CA 19-9: 256   05/22/2023 Cancer Staging   Staging form: Exocrine Pancreas, AJCC 8th Edition - Clinical stage from 05/22/2023: Stage IB (cT2, cN0, cM0) - Signed by Pollyann Samples, NP on 06/09/2023 Stage prefix: Initial diagnosis Total positive nodes: 0   05/22/2023 Procedure   EUS by Dr. Meridee Score: 29 x 28 mm mass in the pancreatic head appearing suspicious for adeno, no malignant appearing lymph nodes were visualized.  Abuts  the gastroduodenal artery but not involved with the SMA or celiac trunk.  T2 N0 by EUS    05/22/2023 Initial Biopsy    A. PANCREAS, HEAD LESION, FINE NEEDLE ASPIRATION:  FINAL MICROSCOPIC DIAGNOSIS:  - Adenocarcinoma    06/09/2023 Initial Diagnosis   Adenocarcinoma of head of pancreas (HCC)   06/17/2023 -  Chemotherapy   Patient is on Treatment Plan : LUNG Gemcitabine (1000) D1,8 q21d      Genetic Testing   [facility] CancerNext-Expanded Panel+RNA was Negative. Report date is 06/24/2023.   The CancerNext-Expanded gene panel offered by [facility] and includes sequencing, rearrangement, and RNA analysis for the following 71 genes: AIP, ALK, APC, ATM, AXIN2, BAP1, BARD1, BMPR1A, BRCA1, BRCA2, BRIP1, CDC73, CDH1, CDK4, CDKN1B, CDKN2A, CHEK2, CTNNA1, DICER1, FH, FLCN, KIF1B, LZTR1, MAX, MEN1, MET, MLH1, MSH2, MSH3, MSH6, MUTYH, NF1, NF2, NTHL1, PALB2, PHOX2B, PMS2, POT1, PRKAR1A, PTCH1, PTEN, RAD51C, RAD51D, RB1, RET, SDHA, SDHAF2, SDHB, SDHC, SDHD, SMAD4, SMARCA4, SMARCB1, SMARCE1, STK11, SUFU, TMEM127, TP53, TSC1, TSC2, and VHL (sequencing and deletion/duplication); EGFR, EGLN1, HOXB13, KIT, MITF, PDGFRA, POLD1, and POLE (sequencing only); EPCAM and GREM1 (deletion/duplication only).       Discussed the use of AI scribe software for clinical note transcription with the patient, who gave verbal consent to proceed.  History of Present Illness   [name], an 82 year old female with a history of pancreatic and lung cancer, presents for a follow-up visit after completing her radiation treatment. She reports a metallic taste in her mouth and fatigue as side effects of the treatment but is otherwise feeling fine. She has been able to maintain her appetite and has not experienced any stomach issues. Telicia has been coughing with phlegm, which  she attributes to sinus drainage. The cough is clear and she denies any associated fever, chills, or shortness of breath. She is currently undergoing chemotherapy  and has had three treatments so far. After the second treatment, she felt unwell for two days, which she attributes to having a port put in the day after her chemotherapy. She is scheduled for two more cycles of chemotherapy and is concerned about potential hair loss.         All other systems were reviewed with the patient and are negative.  MEDICAL HISTORY:  Past Medical History:  Diagnosis Date   Anxiety    Blood glucose elevated 05/27/2012   pt. 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   Overview:  Dr Elnoria Howard  Last Assessment & Plan:  Relevant Hx: Course: Daily Update: Today's Plan:    GERD (gastroesophageal reflux disease)    Hemorrhoid 12/06/2015   History of hiatal hernia    HLD (hyperlipidemia) 12/03/2011   Hypertension    Lung cancer (HCC) dx'd 04/2018   Osteopenia 05/30/2015   Overview:  Bone density 05/2015 started fosamax    Primary localized osteoarthritis of left knee    Primary localized osteoarthritis of right knee 12/06/2015    SURGICAL HISTORY: Past Surgical History:  Procedure Laterality Date   ABDOMINAL HYSTERECTOMY     BIOPSY  05/22/2023   Procedure: BIOPSY;  Surgeon: Lemar Lofty., MD;  Location: [facility];  Service: Gastroenterology;;   CATARACT EXTRACTION, BILATERAL     COLONOSCOPY     ESOPHAGOGASTRODUODENOSCOPY (EGD) WITH PROPOFOL N/A 05/22/2023   Procedure: ESOPHAGOGASTRODUODENOSCOPY (EGD) WITH PROPOFOL;  Surgeon: Lemar Lofty., MD;  Location: [facility];  Service: Gastroenterology;  Laterality: N/A;   EUS N/A 05/22/2023   Procedure: UPPER ENDOSCOPIC ULTRASOUND (EUS) RADIAL;  Surgeon: Lemar Lofty., MD;  Location: [facility]  ENDOS[facility];  Service: Gastroenterology;  Laterality: N/A;   EYE SURGERY Bilateral    Cataract   FINE NEEDLE ASPIRATION  05/22/2023   Procedure: FINE NEEDLE ASPIRATION;  Surgeon: Meridee Score Netty Starring., MD;  Location: [facility];  Service: Gastroenterology;;   implantable loop recorder placement  10/26/2018   MDT LINQ Reveal XT ILR implanted for evaluation of palpitations and atrial fibrillation in office by Dr Johney Frame, Device SN [id number])    IR IMAGING GUIDED PORT INSERTION  06/24/2023   KNEE SURGERY  05/01/2016   left uncompartmental    PARTIAL KNEE ARTHROPLASTY Right 12/18/2015   Procedure: UNICOMPARTMENTAL RIGHT KNEE;  Surgeon: Salvatore Marvel, MD;  Location: [facility] OR;  Service: Orthopedics;  Laterality: Right;   PARTIAL KNEE ARTHROPLASTY Left 05/01/2016   Procedure: UNICOMPARTMENTAL KNEE;  Surgeon: Salvatore Marvel, MD;  Location: [facility] OR;  Service: Orthopedics;  Laterality: Left;   POLYPECTOMY  05/22/2023   Procedure: POLYPECTOMY;  Surgeon: Mansouraty, Netty Starring., MD;  Location: [facility];  Service: Gastroenterology;;   TOTAL ABDOMINAL HYSTERECTOMY W/ BILATERAL SALPINGOOPHORECTOMY     VIDEO ASSISTED THORACOSCOPY (VATS)/ LOBECTOMY Left 05/13/2018   Procedure: VIDEO ASSISTED THORACOSCOPY (VATS)/LEFT UPPER LOBECTOMY;  Surgeon: Loreli Slot, MD;  Location: [facility] OR;  Service: Thoracic;  Laterality: Left;    I have reviewed the social history and family history  with the patient and they are unchanged from previous note.  ALLERGIES:  is allergic to [employer] tartrate], sulfa antibiotics, codeine, hydrocodone, and meperidine.  MEDICATIONS:  Current Outpatient Medications  Medication Sig Dispense Refill   aspirin EC 81 MG tablet Take 1 tablet (81 mg total) by mouth daily. Swallow whole. (Patient not taking: Reported on 06/09/2023) 90 tablet 3   Calcium Carb-Cholecalciferol (CALCIUM-VITAMIN D3) 600-200 MG-UNIT TABS Take by mouth daily.     cetirizine (ZYRTEC) 10 MG tablet Take 10 mg by  mouth as needed for allergies.     diphenhydramine-acetaminophen (TYLENOL PM) 25-500 MG TABS tablet Take 2 tablets by mouth at bedtime as needed (for sleep).     escitalopram (LEXAPRO) 20 MG tablet Take by mouth daily.     lidocaine-prilocaine (EMLA) cream Apply to affected area once 30 g 3   metoprolol succinate (TOPROL-XL) 50 MG 24 hr tablet Take 50 mg by mouth daily. Take with or immediately following a meal.     Multiple Vitamins-Minerals (MULTIVITAMIN PO) Take 1 tablet by mouth daily.     ondansetron (ZOFRAN) 8 MG tablet Take 1 tablet (8 mg total) by mouth every 8 (eight) hours as needed for nausea or vomiting. 30 tablet 1   pantoprazole (PROTONIX) 40 MG tablet Take 40 mg by mouth daily.     polyethylene glycol (MIRALAX / GLYCOLAX) packet Take 8.5 g by mouth every other day.     prochlorperazine (COMPAZINE) 10 MG tablet Take 1 tablet (10 mg total) by mouth every 6 (six) hours as needed for nausea or vomiting. 30 tablet 1   simvastatin (ZOCOR) 20 MG tablet Take 20 mg by mouth daily.     No current facility-administered medications for this visit.    PHYSICAL EXAMINATION: ECOG PERFORMANCE STATUS: 1 - Symptomatic but completely ambulatory  Vitals:   07/14/23 1023  BP: (!) 158/83  Pulse: 69  Resp: 17  Temp: (!) 97.5 F (36.4 C)  SpO2: 97%   Wt Readings from Last 3 Encounters:  07/14/23 144 lb (65.3 kg)  06/24/23 146 lb 6.2 oz (66.4 kg)  06/23/23 146 lb 6.4 oz (66.4 kg)     GENERAL:alert, no distress and comfortable SKIN: skin color, texture, turgor are normal, no rashes or significant lesions EYES: normal, Conjunctiva are pink and non-injected, sclera clear NECK: supple, thyroid normal size, non-tender, without nodularity LYMPH:  no palpable lymphadenopathy in the cervical, axillary  LUNGS: clear to auscultation and percussion with normal breathing effort HEART: regular rate & rhythm and no murmurs and no lower extremity edema ABDOMEN:abdomen soft, non-tender and normal bowel  sounds Musculoskeletal:no cyanosis of digits and no clubbing  NEURO: alert & oriented x 3 with fluent speech, no focal motor/sensory deficits    LABORATORY DATA:  I have reviewed the data as listed    Latest Ref Rng & Units 07/14/2023    9:20 AM 06/23/2023    9:05 AM 06/17/2023    2:24 PM  CBC  WBC 4.0 - 10.5 K/uL 5.2  4.8  8.7   Hemoglobin 12.0 - 15.0 g/dL 36.6  44.0  34.7   Hematocrit 36.0 - 46.0 % 35.8  33.5  39.8   Platelets 150 - 400 K/uL 412  160  210         Latest Ref Rng & Units 07/14/2023    9:20 AM 06/23/2023    9:05 AM 06/17/2023    2:24 PM  CMP  Glucose 70 - 99 mg/dL 425  956  387  BUN 8 - 23 mg/dL 17  18  18    Creatinine 0.44 - 1.00 mg/dL 8.65  7.84  6.96   Sodium 135 - 145 mmol/L 135  135  136   Potassium 3.5 - 5.1 mmol/L 4.3  4.3  3.5   Chloride 98 - 111 mmol/L 103  102  101   CO2 22 - 32 mmol/L 27  27  27    Calcium 8.9 - 10.3 mg/dL 9.2  9.7  9.3   Total Protein 6.5 - 8.1 g/dL 6.3  6.3  6.8   Total Bilirubin <1.2 mg/dL 0.4  0.6  0.6   Alkaline Phos 38 - 126 U/L 70  64  67   AST 15 - 41 U/L 15  15  14    ALT 0 - 44 U/L 9  7  7        RADIOGRAPHIC STUDIES: I have personally reviewed the radiological images as listed and agreed with the findings in the report. No results found.    No orders of the defined types were placed in this encounter.  All questions were answered. The patient knows to call the clinic with any problems, questions or concerns. No barriers to learning was detected. The total time spent in the appointment was 25 minutes.     Malachy Mood, MD 07/14/2023

## 2023-07-14 NOTE — Patient Instructions (Signed)
[facility]  Discharge Instructions: Thank you for choosing [facility] to provide your oncology and hematology care.   If you have a lab appointment with the Cancer Center, please go directly to the Cancer Center and check in at the registration area.   Wear comfortable clothing and clothing appropriate for easy access to any Portacath or PICC line.   We strive to give you quality time with your provider. You may need to reschedule your appointment if you arrive late (15 or more minutes).  Arriving late affects you and other patients whose appointments are after yours.  Also, if you miss three or more appointments without notifying the office, you may be dismissed from the clinic at the provider's discretion.      For prescription refill requests, have your pharmacy contact our office and allow 72 hours for refills to be completed.    Today you received the following chemotherapy and/or immunotherapy agent: Gemcitabine (Gemzar)   To help prevent nausea and vomiting after your treatment, we encourage you to take your nausea medication as directed.  BELOW ARE SYMPTOMS THAT SHOULD BE REPORTED IMMEDIATELY: *FEVER GREATER THAN 100.4 F (38 C) OR HIGHER *CHILLS OR SWEATING *NAUSEA AND VOMITING THAT IS NOT CONTROLLED WITH YOUR NAUSEA MEDICATION *UNUSUAL SHORTNESS OF BREATH *UNUSUAL BRUISING OR BLEEDING *URINARY PROBLEMS (pain or burning when urinating, or frequent urination) *BOWEL PROBLEMS (unusual diarrhea, constipation, pain near the anus) TENDERNESS IN MOUTH AND THROAT WITH OR WITHOUT PRESENCE OF ULCERS (sore throat, sores in mouth, or a toothache) UNUSUAL RASH, SWELLING OR PAIN  UNUSUAL VAGINAL DISCHARGE OR ITCHING   Items with * indicate a potential emergency and should be followed up as soon as possible or go to the Emergency Department if any problems should occur.  Please show the CHEMOTHERAPY ALERT CARD or IMMUNOTHERAPY ALERT CARD  at check-in to the Emergency Department and triage nurse.  Should you have questions after your visit or need to cancel or reschedule your appointment, please contact [facility]  Dept: [phone]  and follow the prompts.  Office hours are 8:00 a.m. to 4:30 p.m. Monday - Friday. Please note that voicemails left after 4:00 p.m. may not be returned until the following business day.  We are closed weekends and major holidays. You have access to a nurse at all times for urgent questions. Please call the main number to the clinic Dept: [phone] and follow the prompts.   For any non-urgent questions, you may also contact your provider using MyChart. We now offer e-Visits for anyone 58 and older to request care online for non-urgent symptoms. For details visit [email or web address].   Also download the MyChart app! Go to the app store, search "MyChart", open the app, select [facility], and log in with your MyChart username and password. Gemcitabine Injection What is this medication? GEMCITABINE (jem SYE ta been) treats some types of cancer. It works by slowing down the growth of cancer cells. This medicine may be used for other purposes; ask your health care provider or pharmacist if you have questions. COMMON BRAND NAME(S): Gemzar, Infugem What should I tell my care team before I take this medication? They need to know if you have any of these conditions: Blood disorders Infection Kidney disease Liver disease Lung or breathing disease, such as asthma or COPD Recent or ongoing radiation therapy An unusual or allergic reaction to gemcitabine, other medications, foods, dyes, or preservatives If you  or your partner are pregnant or trying to get pregnant Breast-feeding How should I use this medication? This medication is injected into a vein. It is given by your care team in a hospital or clinic setting. Talk to your care team about the use of this  medication in children. Special care may be needed. Overdosage: If you think you have taken too much of this medicine contact a poison control center or emergency room at once. NOTE: This medicine is only for you. Do not share this medicine with others. What if I miss a dose? Keep appointments for follow-up doses. It is important not to miss your dose. Call your care team if you are unable to keep an appointment. What may interact with this medication? Interactions have not been studied. This list may not describe all possible interactions. Give your health care provider a list of all the medicines, herbs, non-prescription drugs, or dietary supplements you use. Also tell them if you smoke, drink alcohol, or use illegal drugs. Some items may interact with your medicine. What should I watch for while using this medication? Your condition will be monitored carefully while you are receiving this medication. This medication may make you feel generally unwell. This is not uncommon, as chemotherapy can affect healthy cells as well as cancer cells. Report any side effects. Continue your course of treatment even though you feel ill unless your care team tells you to stop. In some cases, you may be given additional medications to help with side effects. Follow all directions for their use. This medication may increase your risk of getting an infection. Call your care team for advice if you get a fever, chills, sore throat, or other symptoms of a cold or flu. Do not treat yourself. Try to avoid being around people who are sick. This medication may increase your risk to bruise or bleed. Call your care team if you notice any unusual bleeding. Be careful brushing or flossing your teeth or using a toothpick because you may get an infection or bleed more easily. If you have any dental work done, tell your dentist you are receiving this medication. Avoid taking medications that contain aspirin, acetaminophen,  ibuprofen, naproxen, or ketoprofen unless instructed by your care team. These medications may hide a fever. Talk to your care team if you or your partner wish to become pregnant or think you might be pregnant. This medication can cause serious birth defects if taken during pregnancy and for 6 months after the last dose. A negative pregnancy test is required before starting this medication. A reliable form of contraception is recommended while taking this medication and for 6 months after the last dose. Talk to your care team about effective forms of contraception. Do not father a child while taking this medication and for 3 months after the last dose. Use a condom while having sex during this time period. Do not breastfeed while taking this medication and for at least 1 week after the last dose. This medication may cause infertility. Talk to your care team if you are concerned about your fertility. What side effects may I notice from receiving this medication? Side effects that you should report to your care team as soon as possible: Allergic reactions--skin rash, itching, hives, swelling of the face, lips, tongue, or throat Capillary leak syndrome--stomach or muscle pain, unusual weakness or fatigue, feeling faint or lightheaded, decrease in the amount of urine, swelling of the ankles, hands, or feet, trouble breathing Infection--fever, chills, cough, sore  throat, wounds that don't heal, pain or trouble when passing urine, general feeling of discomfort or being unwell Liver injury--right upper belly pain, loss of appetite, nausea, light-colored stool, dark yellow or brown urine, yellowing skin or eyes, unusual weakness or fatigue Low red blood cell level--unusual weakness or fatigue, dizziness, headache, trouble breathing Lung injury--shortness of breath or trouble breathing, cough, spitting up blood, chest pain, fever Stomach pain, bloody diarrhea, pale skin, unusual weakness or fatigue, decrease in  the amount of urine, which may be signs of hemolytic uremic syndrome Sudden and severe headache, confusion, change in vision, seizures, which may be signs of posterior reversible encephalopathy syndrome (PRES) Unusual bruising or bleeding Side effects that usually do not require medical attention (report to your care team if they continue or are bothersome): Diarrhea Drowsiness Hair loss Nausea Pain, redness, or swelling with sores inside the mouth or throat Vomiting This list may not describe all possible side effects. Call your doctor for medical advice about side effects. You may report side effects to FDA at 1-800-FDA-1088. Where should I keep my medication? This medication is given in a hospital or clinic. It will not be stored at home. NOTE: This sheet is a summary. It may not cover all possible information. If you have questions about this medicine, talk to your doctor, pharmacist, or health care provider.  2024 Elsevier/Gold Standard (2022-01-01 00:00:00)

## 2023-07-15 ENCOUNTER — Ambulatory Visit: Payer: Medicare Other | Admitting: Radiation Oncology

## 2023-07-15 NOTE — Radiation Completion Notes (Signed)
Patient Name: [name], [name] MRN: [id number] Date of Birth: February 09, 1941 Referring Physician: Charlett Lango, M.D. Date of Service: 2023-07-15 Radiation Oncologist: Arnette Schaumann, M.D. 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, N=cN0, M=cM0 Intent: Curative     ==========DELIVERED PLANS==========  First Treatment Date: 2023-06-30 - Last Treatment Date: 2023-07-14   Plan Name: Lung_RLL_SBRT Site: Lung, Right Technique: SBRT/SRT-IMRT Mode: Photon Dose Per Fraction: 18 Gy Prescribed Dose (Delivered / Prescribed): 54 Gy / 54 Gy Prescribed Fxs (Delivered / Prescribed): 3 / 3   Plan Name: Lung_RUL_SBRT Site: Lung, Right Technique: SBRT/SRT-IMRT Mode: Photon Dose Per Fraction: 18 Gy Prescribed Dose (Delivered / Prescribed): 54 Gy / 54 Gy Prescribed Fxs (Delivered / Prescribed): 3 / 3   Plan Name: Lung_RML_UHRT Site: Lung, Right Technique: IMRT Mode: Photon Dose Per Fraction: 5 Gy Prescribed Dose (Delivered / Prescribed): 50 Gy / 50 Gy Prescribed Fxs (Delivered / Prescribed): 10 / 10     ==========ON TREATMENT VISIT DATES========== 2023-07-01, 2023-07-01, 2023-07-01, 2023-07-08, 2023-07-09, 2023-07-09     ==========UPCOMING VISITS==========       ==========APPENDIX - ON TREATMENT VISIT NOTES==========   See weekly On Treatment Notes in Epic for details.

## 2023-07-21 ENCOUNTER — Inpatient Hospital Stay (HOSPITAL_BASED_OUTPATIENT_CLINIC_OR_DEPARTMENT_OTHER): Payer: Medicare Other | Admitting: Nurse Practitioner

## 2023-07-21 ENCOUNTER — Encounter: Payer: Self-pay | Admitting: Nurse Practitioner

## 2023-07-21 ENCOUNTER — Inpatient Hospital Stay: Payer: Medicare Other

## 2023-07-21 VITALS — BP 159/77 | HR 65 | Temp 97.7°F | Resp 13 | Wt 142.7 lb

## 2023-07-21 DIAGNOSIS — C250 Malignant neoplasm of head of pancreas: Secondary | ICD-10-CM | POA: Diagnosis not present

## 2023-07-21 DIAGNOSIS — L209 Atopic dermatitis, unspecified: Secondary | ICD-10-CM

## 2023-07-21 DIAGNOSIS — Z79899 Other long term (current) drug therapy: Secondary | ICD-10-CM | POA: Diagnosis not present

## 2023-07-21 DIAGNOSIS — Z7982 Long term (current) use of aspirin: Secondary | ICD-10-CM | POA: Diagnosis not present

## 2023-07-21 DIAGNOSIS — Z923 Personal history of irradiation: Secondary | ICD-10-CM | POA: Diagnosis not present

## 2023-07-21 DIAGNOSIS — Z5111 Encounter for antineoplastic chemotherapy: Secondary | ICD-10-CM | POA: Diagnosis present

## 2023-07-21 DIAGNOSIS — C3492 Malignant neoplasm of unspecified part of left bronchus or lung: Secondary | ICD-10-CM | POA: Diagnosis present

## 2023-07-21 DIAGNOSIS — Z95828 Presence of other vascular implants and grafts: Secondary | ICD-10-CM

## 2023-07-21 LAB — CMP (CANCER CENTER ONLY)
ALT: 19 U/L (ref 0–44)
AST: 30 U/L (ref 15–41)
Albumin: 3.9 g/dL (ref 3.5–5.0)
Alkaline Phosphatase: 73 U/L (ref 38–126)
Anion gap: 7 (ref 5–15)
BUN: 14 mg/dL (ref 8–23)
CO2: 27 mmol/L (ref 22–32)
Calcium: 9.7 mg/dL (ref 8.9–10.3)
Chloride: 102 mmol/L (ref 98–111)
Creatinine: 0.74 mg/dL (ref 0.44–1.00)
GFR, Estimated: >60 mL/min (ref >60)
Glucose, Bld: 108 mg/dL — ABNORMAL HIGH (ref 70–99)
Potassium: 4.1 mmol/L (ref 3.5–5.1)
Sodium: 136 mmol/L (ref 135–145)
Total Bilirubin: 0.5 mg/dL (ref <1.2)
Total Protein: 6.3 g/dL — ABNORMAL LOW (ref 6.5–8.1)

## 2023-07-21 LAB — CBC WITH DIFFERENTIAL (CANCER CENTER ONLY)
Abs Immature Granulocytes: 0.01 10*3/uL (ref 0.00–0.07)
Basophils Absolute: 0 10*3/uL (ref 0.0–0.1)
Basophils Relative: 0 %
Eosinophils Absolute: 0 10*3/uL (ref 0.0–0.5)
Eosinophils Relative: 1 %
HCT: 34.9 % — ABNORMAL LOW (ref 36.0–46.0)
Hemoglobin: 11.8 g/dL — ABNORMAL LOW (ref 12.0–15.0)
Immature Granulocytes: 0 %
Lymphocytes Relative: 20 %
Lymphs Abs: 0.5 10*3/uL — ABNORMAL LOW (ref 0.7–4.0)
MCH: 33.2 pg (ref 26.0–34.0)
MCHC: 33.8 g/dL (ref 30.0–36.0)
MCV: 98.3 fL (ref 80.0–100.0)
Monocytes Absolute: 0.4 10*3/uL (ref 0.1–1.0)
Monocytes Relative: 14 %
Neutro Abs: 1.7 10*3/uL (ref 1.7–7.7)
Neutrophils Relative %: 65 %
Platelet Count: 180 10*3/uL (ref 150–400)
RBC: 3.55 MIL/uL — ABNORMAL LOW (ref 3.87–5.11)
RDW: 13.2 % (ref 11.5–15.5)
WBC Count: 2.6 10*3/uL — ABNORMAL LOW (ref 4.0–10.5)
nRBC: 0 % (ref 0.0–0.2)

## 2023-07-21 MED ORDER — SODIUM CHLORIDE 0.9 % IV SOLN: Freq: Once | INTRAVENOUS | Status: AC

## 2023-07-21 MED ORDER — SODIUM CHLORIDE 0.9% FLUSH
10.0000 mL | Freq: Once | INTRAVENOUS | Status: AC
  Administered 2023-07-21: 10 mL

## 2023-07-21 MED ORDER — HEPARIN SOD (PORK) LOCK FLUSH 100 UNIT/ML IV SOLN
500.0000 [IU] | Freq: Once | INTRAVENOUS | Status: AC | PRN
  Administered 2023-07-21: 500 [IU]

## 2023-07-21 MED ORDER — SODIUM CHLORIDE 0.9% FLUSH
10.0000 mL | INTRAVENOUS | Status: DC | PRN
Start: 2023-07-21 — End: 2023-07-21
  Administered 2023-07-21: 10 mL

## 2023-07-21 MED ORDER — SODIUM CHLORIDE 0.9 % IV SOLN
Freq: Once | INTRAVENOUS | Status: DC
Start: 2023-07-21 — End: 2023-07-21

## 2023-07-21 MED ORDER — TRIAMCINOLONE ACETONIDE 0.5 % EX OINT: 1.0000 | TOPICAL_OINTMENT | Freq: Two times a day (BID) | CUTANEOUS | 0 refills | Status: DC

## 2023-07-21 MED ORDER — SODIUM CHLORIDE 0.9 % IV SOLN
1000.0000 mg/m2 | Freq: Once | INTRAVENOUS | Status: AC
  Administered 2023-07-21: 1672 mg via INTRAVENOUS
  Filled 2023-07-21: qty 43.97

## 2023-07-21 MED ORDER — PROCHLORPERAZINE MALEATE 10 MG PO TABS
10.0000 mg | ORAL_TABLET | Freq: Once | ORAL | Status: AC
  Administered 2023-07-21: 10 mg via ORAL
  Filled 2023-07-21: qty 1

## 2023-07-21 NOTE — Assessment & Plan Note (Addendum)
cT3N0M0 -Incidental findings on the CT scan for lung cancer screening/follow-up -Diagnosed in September 2024 by EUS baseline CA 19.9 256 -Patient has synchronized 2 hypermetabolic lesion in her lungs, highly suspicious for malignancy. -Patient declined Whipple surgery due to her advanced age and diagnosis of lung cancer at the same time. -Patient agreed with low intensity chemotherapy with single agent gemcitabine and started on June 17, 2023, for a total of 3 to 4 months, followed by consolidation radiation if no cancer progression on next restaging CT.

## 2023-07-21 NOTE — Patient Instructions (Signed)
[facility] - A DEPT OF [facility]  Discharge Instructions: Thank you for choosing [facility] to provide your oncology and hematology care.   If you have a lab appointment with the Cancer Center, please go directly to the Cancer Center and check in at the registration area.   Wear comfortable clothing and clothing appropriate for easy access to any Portacath or PICC line.   We strive to give you quality time with your provider. You may need to reschedule your appointment if you arrive late (15 or more minutes).  Arriving late affects you and other patients whose appointments are after yours.  Also, if you miss three or more appointments without notifying the office, you may be dismissed from the clinic at the provider's discretion.      For prescription refill requests, have your pharmacy contact our office and allow 72 hours for refills to be completed.    Today you received the following chemotherapy and/or immunotherapy agents: Gemcitabine.       To help prevent nausea and vomiting after your treatment, we encourage you to take your nausea medication as directed.  BELOW ARE SYMPTOMS THAT SHOULD BE REPORTED IMMEDIATELY: *FEVER GREATER THAN 100.4 F (38 C) OR HIGHER *CHILLS OR SWEATING *NAUSEA AND VOMITING THAT IS NOT CONTROLLED WITH YOUR NAUSEA MEDICATION *UNUSUAL SHORTNESS OF BREATH *UNUSUAL BRUISING OR BLEEDING *URINARY PROBLEMS (pain or burning when urinating, or frequent urination) *BOWEL PROBLEMS (unusual diarrhea, constipation, pain near the anus) TENDERNESS IN MOUTH AND THROAT WITH OR WITHOUT PRESENCE OF ULCERS (sore throat, sores in mouth, or a toothache) UNUSUAL RASH, SWELLING OR PAIN  UNUSUAL VAGINAL DISCHARGE OR ITCHING   Items with * indicate a potential emergency and should be followed up as soon as possible or go to the Emergency Department if any problems should occur.  Please show the CHEMOTHERAPY ALERT CARD or  IMMUNOTHERAPY ALERT CARD at check-in to the Emergency Department and triage nurse.  Should you have questions after your visit or need to cancel or reschedule your appointment, please contact [facility] - A DEPT OF [facility]  Dept: [phone]  and follow the prompts.  Office hours are 8:00 a.m. to 4:30 p.m. Monday - Friday. Please note that voicemails left after 4:00 p.m. may not be returned until the following business day.  We are closed weekends and major holidays. You have access to a nurse at all times for urgent questions. Please call the main number to the clinic Dept: [phone] and follow the prompts.   For any non-urgent questions, you may also contact your provider using MyChart. We now offer e-Visits for anyone 57 and older to request care online for non-urgent symptoms. For details visit [email or web address].   Also download the MyChart app! Go to the app store, search "MyChart", open the app, select [place], and log in with your MyChart username and password.

## 2023-07-21 NOTE — Progress Notes (Signed)
Patient Care Team: Beam, Chales Salmon, MD as PCP - General (Family Medicine) Corky Crafts, MD as PCP - Cardiology (Cardiology) Loreli Slot, MD as Consulting Physician (Cardiothoracic Surgery) Malachy Mood, MD as Consulting Physician (Oncology) Pollyann Samples, NP as Nurse Practitioner (Nurse Practitioner) Antony Blackbird, MD as Consulting Physician (Radiation Oncology)  Clinic Day:  07/21/2023  Referring physician: Malachy Mood, MD  ASSESSMENT & PLAN:   Assessment & Plan: Adenocarcinoma of head of pancreas [facility]) cT3N0M0 -Incidental findings on the CT scan for lung cancer screening/follow-up -Diagnosed in September 2024 by EUS baseline CA 19.9 256 -Patient has synchronized 2 hypermetabolic lesion in her lungs, highly suspicious for malignancy. -Patient declined Whipple surgery due to her advanced age and diagnosis of lung cancer at the same time. -Patient agreed with low intensity chemotherapy with single agent gemcitabine and started on June 17, 2023, for a total of 3 to 4 months, followed by consolidation radiation if no cancer progression on next restaging CT.   Plan: Labs reviewed  -CBC showing WBC 2.6; Hgb 11.8; Hct 34.8; Plt 180; Anc 1.7 -CMP - K 4.1; glucose 1.8; BUN 14; Creatinine 0.74; eGFR >60; Ca 9.7; LFTs normal.   Triamcinolone 0.5% cream may be used twice daily as needed for itching and rash. OK to take benadryl as indicated to help relieve symptoms associated with rash.  OK to proceed with Cycle 2 day 8 gemcitabine as scheduled  Labs/flush, follow up, and infusion as scheduled.  The patient understands the plans discussed today and is in agreement with them.  She knows to contact our office if she develops concerns prior to her next appointment.  I provided 25 minutes of face-to-face time during this encounter and > 50% was spent counseling as documented under my assessment and plan.    Carlean Jews, NP  [facility] - A DEPT OF [facility] [address] [place] [address] Dept: [phone] Dept Fax: [phone]   No orders of the defined types were placed in this encounter.     CHIEF COMPLAINT:  CC: adenocarcinoma of head of pancreas  Current Treatment:  gemcitabine days 1 and 8 of 21 day cycle.   INTERVAL HISTORY:  Shemika is here today for repeat clinical assessment. Last saw Dr. Mosetta Putt 07/14/2023. She presents for Cycle 2 day 8 of single agent gemcitabine today. She has developed a rash at the base of her neck/top of her chest. Very itchy. Started about 2 days after last chemotherapy infusion. Has been taking Benadryl at home. Has helped some. States that her appetite has improved on chemotherapy over radiation. She does feel fatigue, but this is also improved from radiation. States that on Day 2 after chemotherapy, she will have a bout of nausea. She is able to take compazine and get relief. She states that she is staying active and trying to do better with her diet.  She denies fevers or chills. She denies pain. Her weight has decreased 2 pounds over last week .  I have reviewed the past medical history, past surgical history, social history and family history with the patient and they are unchanged from previous note.  ALLERGIES:  is allergic to [employer] tartrate], sulfa antibiotics, codeine, hydrocodone, and meperidine.  MEDICATIONS:  Current Outpatient Medications  Medication Sig Dispense Refill   Calcium Carb-Cholecalciferol (CALCIUM-VITAMIN D3) 600-200 MG-UNIT TABS Take by mouth daily.     cetirizine (ZYRTEC) 10 MG tablet Take 10  mg by mouth as needed for allergies.     diphenhydramine-acetaminophen (TYLENOL PM) 25-500 MG TABS tablet Take 2 tablets by mouth at bedtime as needed (for sleep).     escitalopram (LEXAPRO) 20 MG tablet Take by mouth daily.     lidocaine-prilocaine (EMLA) cream Apply to affected area once 30 g 3   metoprolol succinate  (TOPROL-XL) 50 MG 24 hr tablet Take 50 mg by mouth daily. Take with or immediately following a meal.     Multiple Vitamins-Minerals (MULTIVITAMIN PO) Take 1 tablet by mouth daily.     pantoprazole (PROTONIX) 40 MG tablet Take 40 mg by mouth daily.     polyethylene glycol (MIRALAX / GLYCOLAX) packet Take 8.5 g by mouth every other day.     prochlorperazine (COMPAZINE) 10 MG tablet Take 1 tablet (10 mg total) by mouth every 6 (six) hours as needed for nausea or vomiting. 30 tablet 1   simvastatin (ZOCOR) 20 MG tablet Take 20 mg by mouth daily.     triamcinolone ointment (KENALOG) 0.5 % Apply 1 Application topically 2 (two) times daily. 30 g 0   aspirin EC 81 MG tablet Take 1 tablet (81 mg total) by mouth daily. Swallow whole. 90 tablet 3   ondansetron (ZOFRAN) 8 MG tablet Take 1 tablet (8 mg total) by mouth every 8 (eight) hours as needed for nausea or vomiting. (Patient not taking: Reported on 07/21/2023) 30 tablet 1   No current facility-administered medications for this visit.    HISTORY OF PRESENT ILLNESS:   Oncology History  Adenocarcinoma of left lung, stage 1 (HCC)  07/06/2018 Initial Diagnosis   Adenocarcinoma of left lung, stage 1 (HCC)   02/15/2021 Cancer Staging   Staging form: Lung, AJCC 8th Edition - Clinical: Stage IA3 (cT1c, cN0, cM0) - Signed by Si Gaul, MD on 02/15/2021   Adenocarcinoma of head of pancreas (HCC)  03/03/2023 Imaging   CT chest IMPRESSION: 1. Enlarging and concerning right lung nodules, the largest in the right middle lobe measuring up to 1.4 cm in diameter, highly suspicious for primary bronchogenic carcinoma. The right middle lobe nodule is large enough to evaluate by PET-CT which is recommended. 2. No evidence of thoracic adenopathy or pleural effusion. No suspicious left lung nodule status post upper lobe resection. 3. Aortic Atherosclerosis (ICD10-I70.0) and Emphysema (ICD10-J43.9).   03/28/2023 PET scan   IMPRESSION: 1. 12 mm medial right middle  lobe pulmonary nodule is hypermetabolic and consistent with primary neoplasm. 2. 7 mm right lower lobe nodule and 7 mm right upper lobe nodule do not demonstrate any hypermetabolism but will need close surveillance. 3. No mediastinal or hilar lymphadenopathy. 4. No findings for osseous metastatic disease. 5. Abnormal appearance of the pancreatic head and mild hypermetabolism worrisome for neoplasm. Recommend MRI abdomen without and with contrast for further evaluation. Aortic Atherosclerosis (ICD10-I70.0).   05/04/2023 Imaging   ABD MRI IMPRESSION: 1. Suspicious, solid lesion within the head of pancreas 3.0 x 2.4 x 2.4 cm. There is associated main duct dilatation within the head through tail of pancreas. Suspected lesion corresponds to the area of increased tracer uptake noted on the recent PET-CT. Findings are concerning for pancreatic adenocarcinoma. Motion artifact on the postcontrast images diminishes exam detail within the pancreas. Difficult to exclude involvement of the portal vein. The celiac trunk and proximal SMA appear uninvolved. Consider further evaluation with endoscopic ultrasound and possibly CTA of the abdomen. 2. Scattered T2 hyperintense and T1 hypointense foci within the liver are favored to represent  multiple cysts. Unfortunately, motion artifact diminishes exam detail within the liver for the evaluation underlying enhancing liver lesions. Within this limitation, no obvious suspicious lesion identified at this time. 3. Right middle lobe lung nodule is again identified as seen on the previous PET-CT.   05/04/2023 Imaging   Brain MRI IMPRESSION: No evidence of intracranial metastatic disease.   05/16/2023 Tumor Marker   CA 19-9: 256   05/22/2023 Cancer Staging   Staging form: Exocrine Pancreas, AJCC 8th Edition - Clinical stage from 05/22/2023: Stage IB (cT2, cN0, cM0) - Signed by Pollyann Samples, NP on 06/09/2023 Stage prefix: Initial diagnosis Total positive  nodes: 0   05/22/2023 Procedure   EUS by Dr. Meridee Score: 29 x 28 mm mass in the pancreatic head appearing suspicious for adeno, no malignant appearing lymph nodes were visualized.  Abuts the gastroduodenal artery but not involved with the SMA or celiac trunk.  T2 N0 by EUS    05/22/2023 Initial Biopsy    A. PANCREAS, HEAD LESION, FINE NEEDLE ASPIRATION:  FINAL MICROSCOPIC DIAGNOSIS:  - Adenocarcinoma    06/09/2023 Initial Diagnosis   Adenocarcinoma of head of pancreas (HCC)   06/17/2023 -  Chemotherapy   Patient is on Treatment Plan : LUNG Gemcitabine (1000) D1,8 q21d      Genetic Testing   [facility] CancerNext-Expanded Panel+RNA was Negative. Report date is 06/24/2023.   The CancerNext-Expanded gene panel offered by [facility] and includes sequencing, rearrangement, and RNA analysis for the following 71 genes: AIP, ALK, APC, ATM, AXIN2, BAP1, BARD1, BMPR1A, BRCA1, BRCA2, BRIP1, CDC73, CDH1, CDK4, CDKN1B, CDKN2A, CHEK2, CTNNA1, DICER1, FH, FLCN, KIF1B, LZTR1, MAX, MEN1, MET, MLH1, MSH2, MSH3, MSH6, MUTYH, NF1, NF2, NTHL1, PALB2, PHOX2B, PMS2, POT1, PRKAR1A, PTCH1, PTEN, RAD51C, RAD51D, RB1, RET, SDHA, SDHAF2, SDHB, SDHC, SDHD, SMAD4, SMARCA4, SMARCB1, SMARCE1, STK11, SUFU, TMEM127, TP53, TSC1, TSC2, and VHL (sequencing and deletion/duplication); EGFR, EGLN1, HOXB13, KIT, MITF, PDGFRA, POLD1, and POLE (sequencing only); EPCAM and GREM1 (deletion/duplication only).        REVIEW OF SYSTEMS:   Constitutional: Denies fevers, chills or abnormal weight loss Eyes: Denies blurriness of vision Ears, nose, mouth, throat, and face: Denies mucositis or sore throat Respiratory: Denies cough, dyspnea or wheezes Cardiovascular: Denies palpitation, chest discomfort or lower extremity swelling Gastrointestinal:  Denies nausea, heartburn or change in bowel habits Skin: has skin rash at base of neck/top of chest which is very itchy.  Lymphatics: Denies new lymphadenopathy or easy  bruising Neurological:Denies numbness, tingling or new weaknesses Behavioral/Psych: Mood is stable, no new changes  All other systems were reviewed with the patient and are negative.   VITALS:   Today's Vitals   07/21/23 1310 07/21/23 1315  BP: (!) 159/77   Pulse: 65   Resp: 13   Temp: 97.7 F (36.5 C)   TempSrc: Temporal   SpO2: 97%   Weight: 142 lb 11.2 oz (64.7 kg)   PainSc:  0-No pain   Body mass index is 26.1 kg/m.   Wt Readings from Last 3 Encounters:  07/21/23 142 lb 11.2 oz (64.7 kg)  07/14/23 144 lb (65.3 kg)  06/24/23 146 lb 6.2 oz (66.4 kg)    Body mass index is 26.1 kg/m.  Performance status (ECOG): 1 - Symptomatic but completely ambulatory  PHYSICAL EXAM:   GENERAL:alert, no distress and comfortable SKIN: skin color, texture, turgor are normal. She has red and irritated appearing rash on top of her chest. Skin intact with no open lesions. No drainage noted.  EYES: normal, Conjunctiva  are pink and non-injected, sclera clear OROPHARYNX:no exudate, no erythema and lips, buccal mucosa, and tongue normal  NECK: supple, thyroid normal size, non-tender, without nodularity LYMPH:  no palpable lymphadenopathy in the cervical, axillary or inguinal LUNGS: clear to auscultation and percussion with normal breathing effort HEART: regular rate & rhythm and no murmurs and no lower extremity edema ABDOMEN:abdomen soft, non-tender and normal bowel sounds Musculoskeletal:no cyanosis of digits and no clubbing  NEURO: alert & oriented x 3 with fluent speech, no focal motor/sensory deficits  LABORATORY DATA:  I have reviewed the data as listed    Component Value Date/Time   NA 136 07/21/2023 1250   K 4.1 07/21/2023 1250   CL 102 07/21/2023 1250   CO2 27 07/21/2023 1250   GLUCOSE 108 (H) 07/21/2023 1250   BUN 14 07/21/2023 1250   CREATININE 0.74 07/21/2023 1250   CALCIUM 9.7 07/21/2023 1250   PROT 6.3 (L) 07/21/2023 1250   ALBUMIN 3.9 07/21/2023 1250   AST 30  07/21/2023 1250   ALT 19 07/21/2023 1250   ALKPHOS 73 07/21/2023 1250   BILITOT 0.5 07/21/2023 1250   GFRNONAA >60 07/21/2023 1250   GFRAA >60 01/28/2020 1231     Lab Results  Component Value Date   WBC 2.6 (L) 07/21/2023   NEUTROABS 1.7 07/21/2023   HGB 11.8 (L) 07/21/2023   HCT 34.9 (L) 07/21/2023   MCV 98.3 07/21/2023   PLT 180 07/21/2023     RADIOGRAPHIC STUDIES: I have personally reviewed the radiological images as listed and agreed with the findings in the report. IR IMAGING GUIDED PORT INSERTION  Result Date: 06/24/2023 CLINICAL DATA:  PANCREAS CA, ACCESS FOR CHEMOTHERAPY EXAM: RIGHT INTERNAL JUGULAR SINGLE LUMEN POWER PORT CATHETER INSERTION Date:  06/24/2023 06/24/2023 2:12 pm Radiologist:  Judie Petit. Ruel Favors, MD Guidance:  Ultrasound and fluoroscopic MEDICATIONS: 1% lidocaine local with epinephrine ANESTHESIA/SEDATION: Versed 1.5 mg IV; Fentanyl 75 mcg IV; Moderate Sedation Time:  27 minute The patient was continuously monitored during the procedure by the interventional radiology nurse under my direct supervision. FLUOROSCOPY: 0 minutes, 45 seconds (2.0 mGy) COMPLICATIONS: None immediate. CONTRAST:  None. PROCEDURE: Informed consent was obtained from the patient following explanation of the procedure, risks, benefits and alternatives. The patient understands, agrees and consents for the procedure. All questions were addressed. A time out was performed. Maximal barrier sterile technique utilized including caps, mask, sterile gowns, sterile gloves, large sterile drape, hand hygiene, and 2% chlorhexidine scrub. Under sterile conditions and local anesthesia, right internal jugular micropuncture venous access was performed. Access was performed with ultrasound. Images were obtained for documentation of the patent right internal jugular vein. A guide wire was inserted followed by a transitional dilator. This allowed insertion of a guide wire and catheter into the IVC. Measurements were  obtained from the SVC / RA junction back to the right IJ venotomy site. In the right infraclavicular chest, a subcutaneous pocket was created over the second anterior rib. This was done under sterile conditions and local anesthesia. 1% lidocaine with epinephrine was utilized for this. A 2.5 cm incision was made in the skin. Blunt dissection was performed to create a subcutaneous pocket over the right pectoralis major muscle. The pocket was flushed with saline vigorously. There was adequate hemostasis. The port catheter was assembled and checked for leakage. The port catheter was secured in the pocket with two retention sutures. The tubing was tunneled subcutaneously to the right venotomy site and inserted into the SVC/RA junction through a valved peel-away sheath.  Position was confirmed with fluoroscopy. Images were obtained for documentation. The patient tolerated the procedure well. No immediate complications. Incisions were closed in a two layer fashion with 4 - 0 Vicryl suture. Dermabond was applied to the skin. The port catheter was accessed, blood was aspirated followed by saline and heparin flushes. Needle was removed. A dry sterile dressing was applied. IMPRESSION: Ultrasound and fluoroscopically guided right internal jugular single lumen power port catheter insertion. Tip in the SVC/RA junction. Catheter ready for use. Electronically Signed   By: Judie Petit.  Shick M.D.   On: 06/24/2023 14:17

## 2023-08-03 NOTE — Assessment & Plan Note (Signed)
[id number] -Incidental findings on the CT scan for lung cancer screening/follow-up -Diagnosed in September 2024 by EUS baseline CA 19.9 256 -Patient has synchronized 2 hypermetabolic lesion in her lungs, highly suspicious for malignancy. -Patient declined Whipple surgery due to her advanced age and diagnosis of lung cancer at the same time. -Patient agreed with low intensity chemotherapy with single agent gemcitabine and started on June 17, 2023, for a total of 3 to 4 months, followed by consolidation radiation if no cancer progression on next restaging CT.

## 2023-08-03 NOTE — Assessment & Plan Note (Signed)
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 -pt started SBRT on 06/30/2023 and completed on 07/14/2023

## 2023-08-04 ENCOUNTER — Encounter: Payer: Self-pay | Admitting: Hematology

## 2023-08-04 ENCOUNTER — Inpatient Hospital Stay: Payer: Medicare Other

## 2023-08-04 ENCOUNTER — Inpatient Hospital Stay (HOSPITAL_BASED_OUTPATIENT_CLINIC_OR_DEPARTMENT_OTHER): Payer: Medicare Other | Admitting: Hematology

## 2023-08-04 VITALS — BP 132/68 | HR 66 | Temp 98.0°F | Resp 18

## 2023-08-04 VITALS — BP 125/74 | HR 83 | Temp 97.7°F | Resp 18 | Ht 62.0 in | Wt 141.4 lb

## 2023-08-04 DIAGNOSIS — C250 Malignant neoplasm of head of pancreas: Secondary | ICD-10-CM

## 2023-08-04 DIAGNOSIS — Z5111 Encounter for antineoplastic chemotherapy: Secondary | ICD-10-CM | POA: Diagnosis not present

## 2023-08-04 DIAGNOSIS — Z95828 Presence of other vascular implants and grafts: Secondary | ICD-10-CM

## 2023-08-04 DIAGNOSIS — C3492 Malignant neoplasm of unspecified part of left bronchus or lung: Secondary | ICD-10-CM | POA: Diagnosis not present

## 2023-08-04 LAB — CBC WITH DIFFERENTIAL (CANCER CENTER ONLY)
Abs Immature Granulocytes: 0.04 10*3/uL (ref 0.00–0.07)
Basophils Absolute: 0 10*3/uL (ref 0.0–0.1)
Basophils Relative: 0 %
Eosinophils Absolute: 0.1 10*3/uL (ref 0.0–0.5)
Eosinophils Relative: 1 %
HCT: 30.5 % — ABNORMAL LOW (ref 36.0–46.0)
Hemoglobin: 10.8 g/dL — ABNORMAL LOW (ref 12.0–15.0)
Immature Granulocytes: 1 %
Lymphocytes Relative: 5 %
Lymphs Abs: 0.5 10*3/uL — ABNORMAL LOW (ref 0.7–4.0)
MCH: 35.1 pg — ABNORMAL HIGH (ref 26.0–34.0)
MCHC: 35.4 g/dL (ref 30.0–36.0)
MCV: 99 fL (ref 80.0–100.0)
Monocytes Absolute: 0.5 10*3/uL (ref 0.1–1.0)
Monocytes Relative: 6 %
Neutro Abs: 7.4 10*3/uL (ref 1.7–7.7)
Neutrophils Relative %: 87 %
Platelet Count: 213 10*3/uL (ref 150–400)
RBC: 3.08 MIL/uL — ABNORMAL LOW (ref 3.87–5.11)
RDW: 14.7 % (ref 11.5–15.5)
WBC Count: 8.5 10*3/uL (ref 4.0–10.5)
nRBC: 0 % (ref 0.0–0.2)

## 2023-08-04 LAB — CMP (CANCER CENTER ONLY)
ALT: 12 U/L (ref 0–44)
AST: 23 U/L (ref 15–41)
Albumin: 3.9 g/dL (ref 3.5–5.0)
Alkaline Phosphatase: 75 U/L (ref 38–126)
Anion gap: 8 (ref 5–15)
BUN: 17 mg/dL (ref 8–23)
CO2: 26 mmol/L (ref 22–32)
Calcium: 9.8 mg/dL (ref 8.9–10.3)
Chloride: 101 mmol/L (ref 98–111)
Creatinine: 0.81 mg/dL (ref 0.44–1.00)
GFR, Estimated: >60 mL/min (ref >60)
Glucose, Bld: 135 mg/dL — ABNORMAL HIGH (ref 70–99)
Potassium: 4 mmol/L (ref 3.5–5.1)
Sodium: 135 mmol/L (ref 135–145)
Total Bilirubin: 0.5 mg/dL (ref <1.2)
Total Protein: 6.8 g/dL (ref 6.5–8.1)

## 2023-08-04 MED ORDER — SODIUM CHLORIDE 0.9% FLUSH
10.0000 mL | INTRAVENOUS | Status: DC | PRN
  Administered 2023-08-04: 10 mL

## 2023-08-04 MED ORDER — SODIUM CHLORIDE 0.9 % IV SOLN
1000.0000 mg/m2 | Freq: Once | INTRAVENOUS | Status: AC
  Administered 2023-08-04: 1672 mg via INTRAVENOUS
  Filled 2023-08-04: qty 43.97

## 2023-08-04 MED ORDER — SODIUM CHLORIDE 0.9% FLUSH
10.0000 mL | Freq: Once | INTRAVENOUS | Status: AC
  Administered 2023-08-04: 10 mL

## 2023-08-04 MED ORDER — HEPARIN SOD (PORK) LOCK FLUSH 100 UNIT/ML IV SOLN
500.0000 [IU] | Freq: Once | INTRAVENOUS | Status: AC | PRN
Start: 2023-08-04 — End: 2023-08-04
  Administered 2023-08-04: 500 [IU]

## 2023-08-04 MED ORDER — PROCHLORPERAZINE MALEATE 10 MG PO TABS
10.0000 mg | ORAL_TABLET | Freq: Once | ORAL | Status: AC
Start: 2023-08-04 — End: 2023-08-04
  Administered 2023-08-04: 10 mg via ORAL
  Filled 2023-08-04: qty 1

## 2023-08-04 MED ORDER — SODIUM CHLORIDE 0.9 % IV SOLN
Freq: Once | INTRAVENOUS | Status: AC
Start: 2023-08-04 — End: 2023-08-04

## 2023-08-04 NOTE — Progress Notes (Signed)
[facility]   Telephone:([phone] Fax:([phone]   Clinic Follow up Note   Patient Care Team: Beam, Chales Salmon, MD as PCP - General (Family Medicine) Corky Crafts, MD as PCP - Cardiology (Cardiology) Loreli Slot, MD as Consulting Physician (Cardiothoracic Surgery) Malachy Mood, MD as Consulting Physician (Oncology) Pollyann Samples, NP as Nurse Practitioner (Nurse Practitioner) Antony Blackbird, MD as Consulting Physician (Radiation Oncology)  Date of Service:  08/04/2023  CHIEF COMPLAINT: f/u of pancreatic cancer  CURRENT THERAPY:  Gemcitabine on day 1 and 8 every 21 days  Oncology History   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 -Patient declined Whipple surgery due to her advanced age and diagnosis of lung cancer at the same time. -Patient agreed with low intensity chemotherapy with single agent gemcitabine and started on June 17, 2023, for a total of 3 to 4 months, followed by consolidation radiation if no cancer progression on next restaging CT.  Adenocarcinoma of left lung, stage 1 (HCC) -She had a history of stage Ia non-small cell lung cancer/adenocarcinoma of left lung, status post lobectomy in September 2019 -On surveillance CT scan in June 2024, she was found to have 7 mm right middle lobe nodule, increased to 12 mm and hypermetabolic on PET scan in September 2024, she also has 2 additional 7 mm lung nodules which were not hypermetabolic on PET scan. -pt started SBRT on 06/30/2023 and completed on 07/14/2023    Assessment and Plan    Pancreatic Cancer [age]-year-old undergoing chemotherapy for pancreatic cancer, currently on cycle three with no significant complications. Reports rash managed  with prescription cream and one episode of nausea managed with Compazine. Experiencing fatigue, particularly in the afternoons during chemotherapy. No fever, chills, or abdominal pain. Last whole-body scan in July at diagnosis. Completed radiation therapy on November 4th. Plan to scan after cycle four to assess response to treatment. Consents to continue chemotherapy and understands need for future scans and potential radiation therapy based on results. - Continue chemotherapy as scheduled - Order whole-body scan after cycle four, likely in January - Schedule CT scan after Christmas - Repeat tumor marker - Evaluate labs when available - Monitor port site for redness and irritation - Next chemotherapy sessions on December 2nd, 16th, and 23rd - Follow-up next week.      Plan -Lab reviewed, adequate for treatment, will proceed cycle 3-day 1 chemo today -Cycle 3-day 8 chemotherapy next week -I ordered restaging CT abdomen pelvis with contrast to be done by the end of December   SUMMARY OF ONCOLOGIC HISTORY: Oncology History  Adenocarcinoma of left lung, stage 1 (HCC)  07/06/2018 Initial Diagnosis   Adenocarcinoma of left lung, stage 1 (HCC)   02/15/2021 Cancer Staging   Staging form: Lung, AJCC 8th Edition - Clinical: Stage IA3 (cT1c, cN0, cM0) - Signed by Si Gaul, MD on 02/15/2021   Adenocarcinoma of head of pancreas (HCC)  03/03/2023 Imaging   CT chest IMPRESSION: 1. Enlarging and concerning right lung nodules, the largest in the right middle lobe measuring up to 1.4 cm in diameter, highly suspicious for primary bronchogenic carcinoma. The right middle lobe nodule is large enough to evaluate by PET-CT which is recommended. 2. No evidence of thoracic adenopathy or pleural effusion. No suspicious left lung nodule status post upper  lobe resection. 3. Aortic Atherosclerosis (ICD10-I70.0) and Emphysema (ICD10-J43.9).   03/28/2023 PET scan   IMPRESSION: 1. 12 mm medial right middle  lobe pulmonary nodule is hypermetabolic and consistent with primary neoplasm. 2. 7 mm right lower lobe nodule and 7 mm right upper lobe nodule do not demonstrate any hypermetabolism but will need close surveillance. 3. No mediastinal or hilar lymphadenopathy. 4. No findings for osseous metastatic disease. 5. Abnormal appearance of the pancreatic head and mild hypermetabolism worrisome for neoplasm. Recommend MRI abdomen without and with contrast for further evaluation. Aortic Atherosclerosis (ICD10-I70.0).   05/04/2023 Imaging   ABD MRI IMPRESSION: 1. Suspicious, solid lesion within the head of pancreas 3.0 x 2.4 x 2.4 cm. There is associated main duct dilatation within the head through tail of pancreas. Suspected lesion corresponds to the area of increased tracer uptake noted on the recent PET-CT. Findings are concerning for pancreatic adenocarcinoma. Motion artifact on the postcontrast images diminishes exam detail within the pancreas. Difficult to exclude involvement of the portal vein. The celiac trunk and proximal SMA appear uninvolved. Consider further evaluation with endoscopic ultrasound and possibly CTA of the abdomen. 2. Scattered T2 hyperintense and T1 hypointense foci within the liver are favored to represent multiple cysts. Unfortunately, motion artifact diminishes exam detail within the liver for the evaluation underlying enhancing liver lesions. Within this limitation, no obvious suspicious lesion identified at this time. 3. Right middle lobe lung nodule is again identified as seen on the previous PET-CT.   05/04/2023 Imaging   Brain MRI IMPRESSION: No evidence of intracranial metastatic disease.   05/16/2023 Tumor Marker   CA 19-9: 256   05/22/2023 Cancer Staging   Staging form: Exocrine Pancreas, AJCC 8th Edition - Clinical stage from 05/22/2023: Stage IB (cT2, cN0, cM0) - Signed by Pollyann Samples, NP on 06/09/2023 Stage prefix: Initial diagnosis Total positive  nodes: 0   05/22/2023 Procedure   EUS by Dr. Meridee Score: 29 x 28 mm mass in the pancreatic head appearing suspicious for adeno, no malignant appearing lymph nodes were visualized.  Abuts the gastroduodenal artery but not involved with the SMA or celiac trunk.  T2 N0 by EUS    05/22/2023 Initial Biopsy    A. PANCREAS, HEAD LESION, FINE NEEDLE ASPIRATION:  FINAL MICROSCOPIC DIAGNOSIS:  - Adenocarcinoma    06/09/2023 Initial Diagnosis   Adenocarcinoma of head of pancreas (HCC)   06/17/2023 -  Chemotherapy   Patient is on Treatment Plan : LUNG Gemcitabine (1000) D1,8 q21d      Genetic Testing   [facility] CancerNext-Expanded Panel+RNA was Negative. Report date is 06/24/2023.   The CancerNext-Expanded gene panel offered by [facility] and includes sequencing, rearrangement, and RNA analysis for the following 71 genes: AIP, ALK, APC, ATM, AXIN2, BAP1, BARD1, BMPR1A, BRCA1, BRCA2, BRIP1, CDC73, CDH1, CDK4, CDKN1B, CDKN2A, CHEK2, CTNNA1, DICER1, FH, FLCN, KIF1B, LZTR1, MAX, MEN1, MET, MLH1, MSH2, MSH3, MSH6, MUTYH, NF1, NF2, NTHL1, PALB2, PHOX2B, PMS2, POT1, PRKAR1A, PTCH1, PTEN, RAD51C, RAD51D, RB1, RET, SDHA, SDHAF2, SDHB, SDHC, SDHD, SMAD4, SMARCA4, SMARCB1, SMARCE1, STK11, SUFU, TMEM127, TP53, TSC1, TSC2, and VHL (sequencing and deletion/duplication); EGFR, EGLN1, HOXB13, KIT, MITF, PDGFRA, POLD1, and POLE (sequencing only); EPCAM and GREM1 (deletion/duplication only).       Discussed the use of AI scribe software for clinical note transcription with the patient, who gave verbal consent to proceed.  History of Present Illness   The patient, an 82 year old with a history of pancreatic cancer, presents for a follow-up visit. She is currently undergoing chemotherapy  and reports overall tolerable side effects. She experienced a rash, which was successfully treated with a prescribed cream. She also had one episode of nausea, which was managed with Compazine. The patient reports fatigue, particularly in  the afternoons during her chemotherapy weeks, but considers this manageable given her age and treatment. She denies any fever or chills. The patient also mentions a history of radiation therapy for an unspecified condition, completed approximately three weeks prior to this visit.         All other systems were reviewed with the patient and are negative.  MEDICAL HISTORY:  Past Medical History:  Diagnosis Date   Anxiety    Blood glucose elevated 05/27/2012   pt. 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   Overview:  Dr Elnoria Howard  Last Assessment & Plan:  Relevant Hx: Course: Daily Update: Today's Plan:    GERD (gastroesophageal reflux disease)    Hemorrhoid 12/06/2015   History of hiatal hernia    HLD (hyperlipidemia) 12/03/2011   Hypertension    Lung cancer (HCC) dx'd 04/2018   Osteopenia 05/30/2015   Overview:  Bone density 05/2015 started fosamax    Primary localized osteoarthritis of left knee    Primary localized osteoarthritis of right knee 12/06/2015    SURGICAL HISTORY: Past Surgical History:  Procedure Laterality Date   ABDOMINAL HYSTERECTOMY     BIOPSY  05/22/2023   Procedure: BIOPSY;  Surgeon: Lemar Lofty., MD;  Location: [facility];  Service: Gastroenterology;;   CATARACT EXTRACTION, BILATERAL     COLONOSCOPY     ESOPHAGOGASTRODUODENOSCOPY (EGD) WITH PROPOFOL N/A 05/22/2023   Procedure: ESOPHAGOGASTRODUODENOSCOPY (EGD) WITH PROPOFOL;  Surgeon: Lemar Lofty., MD;  Location: [facility];  Service: Gastroenterology;  Laterality: N/A;   EUS N/A 05/22/2023   Procedure: UPPER ENDOSCOPIC ULTRASOUND (EUS) RADIAL;  Surgeon: Lemar Lofty., MD;   Location: [facility];  Service: Gastroenterology;  Laterality: N/A;   EYE SURGERY Bilateral    Cataract   FINE NEEDLE ASPIRATION  05/22/2023   Procedure: FINE NEEDLE ASPIRATION;  Surgeon: Meridee Score Netty Starring., MD;  Location: [facility];  Service: Gastroenterology;;   implantable loop recorder placement  10/26/2018   MDT LINQ Reveal XT ILR implanted for evaluation of palpitations and atrial fibrillation in office by Dr Johney Frame, Device SN [id number])    IR IMAGING GUIDED PORT INSERTION  06/24/2023   KNEE SURGERY  05/01/2016   left uncompartmental    PARTIAL KNEE ARTHROPLASTY Right 12/18/2015   Procedure: UNICOMPARTMENTAL RIGHT KNEE;  Surgeon: Salvatore Marvel, MD;  Location: [facility] OR;  Service: Orthopedics;  Laterality: Right;   PARTIAL KNEE ARTHROPLASTY Left 05/01/2016   Procedure: UNICOMPARTMENTAL KNEE;  Surgeon: Salvatore Marvel, MD;  Location: [facility] OR;  Service: Orthopedics;  Laterality: Left;   POLYPECTOMY  05/22/2023   Procedure: POLYPECTOMY;  Surgeon: Mansouraty, Netty Starring., MD;  Location: [facility];  Service: Gastroenterology;;   TOTAL ABDOMINAL HYSTERECTOMY W/ BILATERAL SALPINGOOPHORECTOMY     VIDEO ASSISTED THORACOSCOPY (VATS)/ LOBECTOMY Left 05/13/2018   Procedure: VIDEO ASSISTED THORACOSCOPY (VATS)/LEFT UPPER LOBECTOMY;  Surgeon: Loreli Slot, MD;  Location: [facility] OR;  Service: Thoracic;  Laterality: Left;    I have reviewed  the social history and family history with the patient and they are unchanged from previous note.  ALLERGIES:  is allergic to [employer] tartrate], sulfa antibiotics, codeine, hydrocodone, and meperidine.  MEDICATIONS:  Current Outpatient Medications  Medication Sig Dispense Refill   Calcium Carb-Cholecalciferol (CALCIUM-VITAMIN D3) 600-200 MG-UNIT TABS Take by mouth daily.     cetirizine (ZYRTEC) 10 MG tablet Take 10 mg by mouth as needed for allergies.     diphenhydramine-acetaminophen (TYLENOL PM) 25-500 MG TABS tablet Take 2 tablets by mouth at  bedtime as needed (for sleep).     escitalopram (LEXAPRO) 20 MG tablet Take by mouth daily.     lidocaine-prilocaine (EMLA) cream Apply to affected area once 30 g 3   metoprolol succinate (TOPROL-XL) 50 MG 24 hr tablet Take 50 mg by mouth daily. Take with or immediately following a meal.     Multiple Vitamins-Minerals (MULTIVITAMIN PO) Take 1 tablet by mouth daily.     ondansetron (ZOFRAN) 8 MG tablet Take 1 tablet (8 mg total) by mouth every 8 (eight) hours as needed for nausea or vomiting. (Patient not taking: Reported on 07/21/2023) 30 tablet 1   pantoprazole (PROTONIX) 40 MG tablet Take 40 mg by mouth daily.     polyethylene glycol (MIRALAX / GLYCOLAX) packet Take 8.5 g by mouth every other day.     prochlorperazine (COMPAZINE) 10 MG tablet Take 1 tablet (10 mg total) by mouth every 6 (six) hours as needed for nausea or vomiting. 30 tablet 1   simvastatin (ZOCOR) 20 MG tablet Take 20 mg by mouth daily.     triamcinolone ointment (KENALOG) 0.5 % Apply 1 Application topically 2 (two) times daily. 30 g 0   No current facility-administered medications for this visit.    PHYSICAL EXAMINATION: ECOG PERFORMANCE STATUS: 1 - Symptomatic but completely ambulatory  Vitals:   08/04/23 1134  BP: 125/74  Pulse: 83  Resp: 18  Temp: 97.7 F (36.5 C)  SpO2: 96%   Wt Readings from Last 3 Encounters:  08/04/23 141 lb 7 oz (64.2 kg)  07/21/23 142 lb 11.2 oz (64.7 kg)  07/14/23 144 lb (65.3 kg)     GENERAL:alert, no distress and comfortable SKIN: skin color, texture, turgor are normal, no rashes or significant lesions EYES: normal, Conjunctiva are pink and non-injected, sclera clear NECK: supple, thyroid normal size, non-tender, without nodularity LYMPH:  no palpable lymphadenopathy in the cervical, axillary  LUNGS: clear to auscultation and percussion with normal breathing effort HEART: regular rate & rhythm and no murmurs and no lower extremity edema ABDOMEN:abdomen soft, non-tender and  normal bowel sounds Musculoskeletal:no cyanosis of digits and no clubbing  NEURO: alert & oriented x 3 with fluent speech, no focal motor/sensory deficits    LABORATORY DATA:  I have reviewed the data as listed    Latest Ref Rng & Units 08/04/2023   11:11 AM 07/21/2023   12:50 PM 07/14/2023    9:20 AM  CBC  WBC 4.0 - 10.5 K/uL 8.5  2.6  5.2   Hemoglobin 12.0 - 15.0 g/dL 62.1  30.8  65.7   Hematocrit 36.0 - 46.0 % 30.5  34.9  35.8   Platelets 150 - 400 K/uL 213  180  412         Latest Ref Rng & Units 07/21/2023   12:50 PM 07/14/2023    9:20 AM 06/23/2023    9:05 AM  CMP  Glucose 70 - 99 mg/dL 846  962  952   BUN  8 - 23 mg/dL 14  17  18    Creatinine 0.44 - 1.00 mg/dL 1.61  0.96  0.45   Sodium 135 - 145 mmol/L 136  135  135   Potassium 3.5 - 5.1 mmol/L 4.1  4.3  4.3   Chloride 98 - 111 mmol/L 102  103  102   CO2 22 - 32 mmol/L 27  27  27    Calcium 8.9 - 10.3 mg/dL 9.7  9.2  9.7   Total Protein 6.5 - 8.1 g/dL 6.3  6.3  6.3   Total Bilirubin <1.2 mg/dL 0.5  0.4  0.6   Alkaline Phos 38 - 126 U/L 73  70  64   AST 15 - 41 U/L 30  15  15    ALT 0 - 44 U/L 19  9  7        RADIOGRAPHIC STUDIES: I have personally reviewed the radiological images as listed and agreed with the findings in the report. No results found.    Orders Placed This Encounter  Procedures   CT ABDOMEN PELVIS W CONTRAST    Standing Status:   Future    Standing Expiration Date:   08/03/2024    Order Specific Question:   If indicated for the ordered procedure, I authorize the administration of contrast media per Radiology protocol    Answer:   Yes    Order Specific Question:   Does the patient have a contrast media/X-ray dye allergy?    Answer:   Yes    Order Specific Question:   Preferred imaging location?    Answer:   [facility]    Order Specific Question:   If indicated for the ordered procedure, I authorize the administration of oral contrast media per Radiology protocol    Answer:   Yes    Cancer antigen 19-9    Standing Status:   Standing    Number of Occurrences:   20    Standing Expiration Date:   08/03/2024   CBC with Differential (Cancer Center Only)    Standing Status:   Future    Standing Expiration Date:   09/14/2024   CMP (Cancer Center only)    Standing Status:   Future    Standing Expiration Date:   09/14/2024   CBC with Differential (Cancer Center Only)    Standing Status:   Future    Standing Expiration Date:   09/21/2024   CMP (Cancer Center only)    Standing Status:   Future    Standing Expiration Date:   09/21/2024   All questions were answered. The patient knows to call the clinic with any problems, questions or concerns. No barriers to learning was detected. The total time spent in the appointment was 25 minutes.     Malachy Mood, MD 08/04/2023

## 2023-08-04 NOTE — Patient Instructions (Signed)
[facility] - A DEPT OF [facility]  Discharge Instructions: Thank you for choosing [facility] to provide your oncology and hematology care.   If you have a lab appointment with the Cancer Center, please go directly to the Cancer Center and check in at the registration area.   Wear comfortable clothing and clothing appropriate for easy access to any Portacath or PICC line.   We strive to give you quality time with your provider. You may need to reschedule your appointment if you arrive late (15 or more minutes).  Arriving late affects you and other patients whose appointments are after yours.  Also, if you miss three or more appointments without notifying the office, you may be dismissed from the clinic at the provider's discretion.      For prescription refill requests, have your pharmacy contact our office and allow 72 hours for refills to be completed.    Today you received the following chemotherapy and/or immunotherapy agents: Gemzar      To help prevent nausea and vomiting after your treatment, we encourage you to take your nausea medication as directed.  BELOW ARE SYMPTOMS THAT SHOULD BE REPORTED IMMEDIATELY: *FEVER GREATER THAN 100.4 F (38 C) OR HIGHER *CHILLS OR SWEATING *NAUSEA AND VOMITING THAT IS NOT CONTROLLED WITH YOUR NAUSEA MEDICATION *UNUSUAL SHORTNESS OF BREATH *UNUSUAL BRUISING OR BLEEDING *URINARY PROBLEMS (pain or burning when urinating, or frequent urination) *BOWEL PROBLEMS (unusual diarrhea, constipation, pain near the anus) TENDERNESS IN MOUTH AND THROAT WITH OR WITHOUT PRESENCE OF ULCERS (sore throat, sores in mouth, or a toothache) UNUSUAL RASH, SWELLING OR PAIN  UNUSUAL VAGINAL DISCHARGE OR ITCHING   Items with * indicate a potential emergency and should be followed up as soon as possible or go to the Emergency Department if any problems should occur.  Please show the CHEMOTHERAPY ALERT CARD or IMMUNOTHERAPY  ALERT CARD at check-in to the Emergency Department and triage nurse.  Should you have questions after your visit or need to cancel or reschedule your appointment, please contact [facility] - A DEPT OF [facility]  Dept: [phone]  and follow the prompts.  Office hours are 8:00 a.m. to 4:30 p.m. Monday - Friday. Please note that voicemails left after 4:00 p.m. may not be returned until the following business day.  We are closed weekends and major holidays. You have access to a nurse at all times for urgent questions. Please call the main number to the clinic Dept: [phone] and follow the prompts.   For any non-urgent questions, you may also contact your provider using MyChart. We now offer e-Visits for anyone 39 and older to request care online for non-urgent symptoms. For details visit [email or web address].   Also download the MyChart app! Go to the app store, search "MyChart", open the app, select [place], and log in with your MyChart username and password.

## 2023-08-05 ENCOUNTER — Telehealth: Payer: Self-pay

## 2023-08-05 ENCOUNTER — Other Ambulatory Visit: Payer: Self-pay

## 2023-08-05 DIAGNOSIS — K8689 Other specified diseases of pancreas: Secondary | ICD-10-CM

## 2023-08-05 NOTE — Telephone Encounter (Signed)
Fiducial has been added to 10/30/23 at 1045 am at [facility] with GM   Left message on machine to call back

## 2023-08-05 NOTE — Telephone Encounter (Signed)
-----   Message from [facility] sent at 08/05/2023  5:41 AM EST ----- Regarding: EUS fiducials [name], This patient needs fiducials placed in January or the beginning of February. Please get her on the list for February based on availability. GM

## 2023-08-06 NOTE — Telephone Encounter (Signed)
Fiducial EUS scheduled, pt instructed and medications reviewed.  Patient instructions mailed to home.  Patient to call with any questions or concerns.

## 2023-08-10 NOTE — Assessment & Plan Note (Addendum)
cT3N0M0 -Incidental findings on the CT scan for lung cancer screening/follow-up -Diagnosed in September 2024 by EUS baseline CA 19.9 256 -Patient has synchronized 2 hypermetabolic lesion in her lungs, highly suspicious for malignancy. -Patient declined Whipple surgery due to her advanced age and diagnosis of lung cancer at the same time. -Patient agreed with low intensity chemotherapy with single agent gemcitabine and started on June 17, 2023, for a total of 3 to 4 months, followed by consolidation radiation if no cancer progression on next restaging CT. -08/11/2023 -she presents for cycle 3-day 8. --has had few side effects other than fatigue. Proceed with treatment as planned.

## 2023-08-10 NOTE — Assessment & Plan Note (Signed)
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 -pt started SBRT on 06/30/2023 and completed on 07/14/2023

## 2023-08-10 NOTE — Progress Notes (Unsigned)
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 -Patient declined Whipple surgery due to her advanced age and diagnosis of lung cancer at the same time. -Patient agreed with low intensity chemotherapy with single agent gemcitabine and started on June 17, 2023, for a total of 3 to 4 months, followed by consolidation radiation if no cancer progression on next restaging CT. -08/11/2023 -she presents for cycle 3-day 8.  Adenocarcinoma of left lung, stage 1 (HCC) -She had a history of stage Ia non-small cell lung cancer/adenocarcinoma of left lung, status post lobectomy in September 2019 -On surveillance CT scan in June 2024, she was found to have 7 mm right middle lobe nodule, increased to 12 mm and hypermetabolic on PET scan in September 2024, she also has 2 additional 7 mm lung nodules which were not hypermetabolic on PET scan. -pt started SBRT on 06/30/2023 and completed on 07/14/2023   Plan: Labs reviewed  -CBC showing WBC 2.5; Hgb 9.7; Hct 28.3; Plt 276; Anc 1.6 -CMP - K 3.7; glucose 136; BUN 14; Creatinine 0.72; eGFR > 60; Ca 9.3; AST 42; ALT 28; AlkPhos 69.   Ca 19.9 pending. Will notify patient of results via mychart when it is available.  Reviewed infection prevention strategies  with the patient due to lower than normal WBC. She understands that good hand washing, staying away from crowds, and wearing a mask in public, are ways she can reduce her risks for infection.  Labs are adequate for treatment today.  Proceed with cycle 3 day 8 NM whole body bone scan and CT chest abdomen and pelvis to be done after cycle 4 and holidays.  Labs/flush, follow up, and treatments as scheduled.   The patient understands the plans discussed today and is in agreement with them.  She knows to contact our office if she develops concerns prior to her next appointment.  I provided 20 minutes of face-to-face time during this encounter and > 50% was spent counseling as documented under my assessment and plan.    Crystal Jews, NP  [facility] - A DEPT OF [facility] [address] [place] [address] Dept: [phone] Dept Fax: [phone]   No orders of the defined types were placed in this encounter.     CHIEF COMPLAINT:  CC: Adenocarcinoma of head of pancreas  Current Treatment: Gemcitabine day 1 and day 8 of 21-day cycle  INTERVAL HISTORY:  [name] is here today for repeat clinical assessment.  She was last seen by Dr. Mosetta Putt on 08/04/2023.  She has tolerated single agent gemcitabine with mild side effects.  She does have rash which is managed with prescription cream and nausea which has been managed with Compazine.  Nuclear medicine whole-body scan should be scheduled after cycle 4.  Repeat CT scan after Christmas.  Plan to proceed with chemotherapy  treatments as scheduled today, 08/11/2023, 08/25/2023, and 09/01/2023. Patient states that her only complaint is fatigue. Feels like she overdid things for Thanksgiving. Was able to cook and host the meal for her family. She states that she did have to rest several times throughout the day. Today, she continues to feel fatigued.  She denies chest pain, chest  pressure, or shortness of breath. She denies headaches or visual disturbances. She denies abdominal pain, nausea, vomiting, or changes in bowel or bladder habits.  Her appetite is good. Her weight has been stable.  I have reviewed the past medical history, past surgical history, social history and family history with the patient and they are unchanged from previous note.  ALLERGIES:  is allergic to [employer] tartrate], sulfa antibiotics, codeine, hydrocodone, and meperidine.  MEDICATIONS:  Current Outpatient Medications  Medication Sig Dispense Refill   Calcium Carb-Cholecalciferol (CALCIUM-VITAMIN D3) 600-200 MG-UNIT TABS Take by mouth daily.     cetirizine (ZYRTEC) 10 MG tablet Take 10 mg by mouth as needed for allergies.     diphenhydramine-acetaminophen (TYLENOL PM) 25-500 MG TABS tablet Take 2 tablets by mouth at bedtime as needed (for sleep).     escitalopram (LEXAPRO) 20 MG tablet Take by mouth daily.     lidocaine-prilocaine (EMLA) cream Apply to affected area once 30 g 3   metoprolol succinate (TOPROL-XL) 50 MG 24 hr tablet Take 50 mg by mouth daily. Take with or immediately following a meal.     Multiple Vitamins-Minerals (MULTIVITAMIN PO) Take 1 tablet by mouth daily.     ondansetron (ZOFRAN) 8 MG tablet Take 1 tablet (8 mg total) by mouth every 8 (eight) hours as needed for nausea or vomiting. 30 tablet 1   pantoprazole (PROTONIX) 40 MG tablet Take 40 mg by mouth daily.     polyethylene glycol (MIRALAX / GLYCOLAX) packet Take 8.5 g by mouth every other day.     prochlorperazine (COMPAZINE) 10 MG tablet Take 1 tablet (10 mg total) by mouth every 6 (six) hours as needed for nausea or vomiting. 30 tablet 1   simvastatin (ZOCOR) 20 MG tablet Take 20 mg by mouth daily.     triamcinolone ointment (KENALOG) 0.5 % Apply 1 Application topically 2 (two) times daily. 30 g 0   No current facility-administered medications for this visit.    HISTORY OF PRESENT ILLNESS:   Oncology  History  Adenocarcinoma of left lung, stage 1 (HCC)  07/06/2018 Initial Diagnosis   Adenocarcinoma of left lung, stage 1 (HCC)   02/15/2021 Cancer Staging   Staging form: Lung, AJCC 8th Edition - Clinical: Stage IA3 (cT1c, cN0, cM0) - Signed by Si Gaul, MD on 02/15/2021   Adenocarcinoma of head of pancreas (HCC)  03/03/2023 Imaging   CT chest IMPRESSION: 1. Enlarging and concerning right lung nodules, the largest in the right middle lobe measuring up to 1.4 cm in diameter, highly suspicious for primary bronchogenic carcinoma. The right middle lobe nodule is large enough to evaluate by PET-CT which is recommended. 2. No evidence of thoracic adenopathy or pleural effusion. No suspicious left lung nodule status post upper lobe resection. 3. Aortic Atherosclerosis (ICD10-I70.0) and Emphysema (ICD10-J43.9).   03/28/2023 PET scan   IMPRESSION: 1. 12 mm medial right middle lobe pulmonary nodule is hypermetabolic and consistent with primary neoplasm. 2. 7 mm right lower lobe nodule and 7 mm right upper lobe nodule do not demonstrate any hypermetabolism but will need close surveillance. 3. No mediastinal or hilar lymphadenopathy. 4. No findings for  osseous metastatic disease. 5. Abnormal appearance of the pancreatic head and mild hypermetabolism worrisome for neoplasm. Recommend MRI abdomen without and with contrast for further evaluation. Aortic Atherosclerosis (ICD10-I70.0).   05/04/2023 Imaging   ABD MRI IMPRESSION: 1. Suspicious, solid lesion within the head of pancreas 3.0 x 2.4 x 2.4 cm. There is associated main duct dilatation within the head through tail of pancreas. Suspected lesion corresponds to the area of increased tracer uptake noted on the recent PET-CT. Findings are concerning for pancreatic adenocarcinoma. Motion artifact on the postcontrast images diminishes exam detail within the pancreas. Difficult to exclude involvement of the portal vein. The celiac trunk and  proximal SMA appear uninvolved. Consider further evaluation with endoscopic ultrasound and possibly CTA of the abdomen. 2. Scattered T2 hyperintense and T1 hypointense foci within the liver are favored to represent multiple cysts. Unfortunately, motion artifact diminishes exam detail within the liver for the evaluation underlying enhancing liver lesions. Within this limitation, no obvious suspicious lesion identified at this time. 3. Right middle lobe lung nodule is again identified as seen on the previous PET-CT.   05/04/2023 Imaging   Brain MRI IMPRESSION: No evidence of intracranial metastatic disease.   05/16/2023 Tumor Marker   CA 19-9: 256   05/22/2023 Cancer Staging   Staging form: Exocrine Pancreas, AJCC 8th Edition - Clinical stage from 05/22/2023: Stage IB (cT2, cN0, cM0) - Signed by Pollyann Samples, NP on 06/09/2023 Stage prefix: Initial diagnosis Total positive nodes: 0   05/22/2023 Procedure   EUS by Dr. Meridee Score: 29 x 28 mm mass in the pancreatic head appearing suspicious for adeno, no malignant appearing lymph nodes were visualized.  Abuts the gastroduodenal artery but not involved with the SMA or celiac trunk.  T2 N0 by EUS    05/22/2023 Initial Biopsy    A. PANCREAS, HEAD LESION, FINE NEEDLE ASPIRATION:  FINAL MICROSCOPIC DIAGNOSIS:  - Adenocarcinoma    06/09/2023 Initial Diagnosis   Adenocarcinoma of head of pancreas (HCC)   06/17/2023 -  Chemotherapy   Patient is on Treatment Plan : LUNG Gemcitabine (1000) D1,8 q21d      Genetic Testing   [facility] CancerNext-Expanded Panel+RNA was Negative. Report date is 06/24/2023.   The CancerNext-Expanded gene panel offered by [facility] and includes sequencing, rearrangement, and RNA analysis for the following 71 genes: AIP, ALK, APC, ATM, AXIN2, BAP1, BARD1, BMPR1A, BRCA1, BRCA2, BRIP1, CDC73, CDH1, CDK4, CDKN1B, CDKN2A, CHEK2, CTNNA1, DICER1, FH, FLCN, KIF1B, LZTR1, MAX, MEN1, MET, MLH1, MSH2, MSH3, MSH6, MUTYH, NF1, NF2,  NTHL1, PALB2, PHOX2B, PMS2, POT1, PRKAR1A, PTCH1, PTEN, RAD51C, RAD51D, RB1, RET, SDHA, SDHAF2, SDHB, SDHC, SDHD, SMAD4, SMARCA4, SMARCB1, SMARCE1, STK11, SUFU, TMEM127, TP53, TSC1, TSC2, and VHL (sequencing and deletion/duplication); EGFR, EGLN1, HOXB13, KIT, MITF, PDGFRA, POLD1, and POLE (sequencing only); EPCAM and GREM1 (deletion/duplication only).        REVIEW OF SYSTEMS:   Constitutional: Denies fevers, chills or abnormal weight loss. She reports moderate fatigue.  Eyes: Denies blurriness of vision Ears, nose, mouth, throat, and face: Denies mucositis or sore throat Respiratory: Denies cough, dyspnea or wheezes Cardiovascular: Denies palpitation, chest discomfort or lower extremity swelling Gastrointestinal:  Denies nausea, heartburn or change in bowel habits Skin: Denies abnormal skin rashes Lymphatics: Denies new lymphadenopathy or easy bruising Neurological:Denies numbness, tingling or new weaknesses Behavioral/Psych: Mood is stable, no new changes  All other systems were reviewed with the patient and are negative.   VITALS:   Today's Vitals   08/11/23 1252  BP: 126/84  Pulse: 82  Resp: 17  Temp: 97.8 F (36.6 C)  TempSrc: Temporal  SpO2: 98%  Weight: 142 lb 3.2 oz (64.5 kg)  PainSc: 0-No pain   Body mass index is 26.01 kg/m.   Wt Readings from Last 3 Encounters:  08/11/23 142 lb 3.2 oz (64.5 kg)  08/04/23 141 lb 7 oz (64.2 kg)  07/21/23 142 lb 11.2 oz (64.7 kg)    Body mass index is 26.01 kg/m.  Performance status (ECOG): 1 - Symptomatic but completely ambulatory  PHYSICAL EXAM:   GENERAL:alert, no distress and comfortable SKIN: skin color, texture, turgor are normal, no rashes or significant lesions EYES: normal, Conjunctiva are pink and non-injected, sclera clear OROPHARYNX:no exudate, no erythema and lips, buccal mucosa, and tongue normal  NECK: supple, thyroid normal size, non-tender, without nodularity LYMPH:  no palpable lymphadenopathy in the  cervical, axillary or inguinal LUNGS: clear to auscultation and percussion with normal breathing effort HEART: regular rate & rhythm and no murmurs and no lower extremity edema ABDOMEN:abdomen soft, non-tender and normal bowel sounds Musculoskeletal:no cyanosis of digits and no clubbing  NEURO: alert & oriented x 3 with fluent speech, no focal motor/sensory deficits  LABORATORY DATA:  I have reviewed the data as listed    Component Value Date/Time   NA 136 08/11/2023 1239   K 3.7 08/11/2023 1239   CL 101 08/11/2023 1239   CO2 27 08/11/2023 1239   GLUCOSE 136 (H) 08/11/2023 1239   BUN 14 08/11/2023 1239   CREATININE 0.72 08/11/2023 1239   CALCIUM 9.3 08/11/2023 1239   PROT 6.2 (L) 08/11/2023 1239   ALBUMIN 3.7 08/11/2023 1239   AST 42 (H) 08/11/2023 1239   ALT 28 08/11/2023 1239   ALKPHOS 69 08/11/2023 1239   BILITOT 0.4 08/11/2023 1239   GFRNONAA >60 08/11/2023 1239   GFRAA >60 01/28/2020 1231      Lab Results  Component Value Date   WBC 2.5 (L) 08/11/2023   NEUTROABS 1.6 (L) 08/11/2023   HGB 9.7 (L) 08/11/2023   HCT 28.3 (L) 08/11/2023   MCV 98.6 08/11/2023   PLT 276 08/11/2023

## 2023-08-11 ENCOUNTER — Inpatient Hospital Stay: Payer: Medicare Other | Attending: Internal Medicine

## 2023-08-11 ENCOUNTER — Encounter: Payer: Self-pay | Admitting: Nurse Practitioner

## 2023-08-11 ENCOUNTER — Inpatient Hospital Stay: Payer: Medicare Other

## 2023-08-11 ENCOUNTER — Inpatient Hospital Stay (HOSPITAL_BASED_OUTPATIENT_CLINIC_OR_DEPARTMENT_OTHER): Payer: Medicare Other | Admitting: Nurse Practitioner

## 2023-08-11 ENCOUNTER — Telehealth: Payer: Self-pay | Admitting: *Deleted

## 2023-08-11 VITALS — BP 126/84 | HR 82 | Temp 97.8°F | Resp 17 | Wt 142.2 lb

## 2023-08-11 DIAGNOSIS — C3492 Malignant neoplasm of unspecified part of left bronchus or lung: Secondary | ICD-10-CM

## 2023-08-11 DIAGNOSIS — C250 Malignant neoplasm of head of pancreas: Secondary | ICD-10-CM | POA: Diagnosis not present

## 2023-08-11 DIAGNOSIS — Z79899 Other long term (current) drug therapy: Secondary | ICD-10-CM | POA: Diagnosis not present

## 2023-08-11 DIAGNOSIS — Z5111 Encounter for antineoplastic chemotherapy: Secondary | ICD-10-CM | POA: Insufficient documentation

## 2023-08-11 DIAGNOSIS — Z95828 Presence of other vascular implants and grafts: Secondary | ICD-10-CM

## 2023-08-11 LAB — CMP (CANCER CENTER ONLY)
ALT: 28 U/L (ref 0–44)
AST: 42 U/L — ABNORMAL HIGH (ref 15–41)
Albumin: 3.7 g/dL (ref 3.5–5.0)
Alkaline Phosphatase: 69 U/L (ref 38–126)
Anion gap: 8 (ref 5–15)
BUN: 14 mg/dL (ref 8–23)
CO2: 27 mmol/L (ref 22–32)
Calcium: 9.3 mg/dL (ref 8.9–10.3)
Chloride: 101 mmol/L (ref 98–111)
Creatinine: 0.72 mg/dL (ref 0.44–1.00)
GFR, Estimated: >60 mL/min (ref >60)
Glucose, Bld: 136 mg/dL — ABNORMAL HIGH (ref 70–99)
Potassium: 3.7 mmol/L (ref 3.5–5.1)
Sodium: 136 mmol/L (ref 135–145)
Total Bilirubin: 0.4 mg/dL (ref <1.2)
Total Protein: 6.2 g/dL — ABNORMAL LOW (ref 6.5–8.1)

## 2023-08-11 LAB — CBC WITH DIFFERENTIAL (CANCER CENTER ONLY)
Abs Immature Granulocytes: 0.01 10*3/uL (ref 0.00–0.07)
Basophils Absolute: 0 10*3/uL (ref 0.0–0.1)
Basophils Relative: 0 %
Eosinophils Absolute: 0 10*3/uL (ref 0.0–0.5)
Eosinophils Relative: 0 %
HCT: 28.3 % — ABNORMAL LOW (ref 36.0–46.0)
Hemoglobin: 9.7 g/dL — ABNORMAL LOW (ref 12.0–15.0)
Immature Granulocytes: 0 %
Lymphocytes Relative: 20 %
Lymphs Abs: 0.5 10*3/uL — ABNORMAL LOW (ref 0.7–4.0)
MCH: 33.8 pg (ref 26.0–34.0)
MCHC: 34.3 g/dL (ref 30.0–36.0)
MCV: 98.6 fL (ref 80.0–100.0)
Monocytes Absolute: 0.4 10*3/uL (ref 0.1–1.0)
Monocytes Relative: 16 %
Neutro Abs: 1.6 10*3/uL — ABNORMAL LOW (ref 1.7–7.7)
Neutrophils Relative %: 64 %
Platelet Count: 276 10*3/uL (ref 150–400)
RBC: 2.87 MIL/uL — ABNORMAL LOW (ref 3.87–5.11)
RDW: 14.6 % (ref 11.5–15.5)
WBC Count: 2.5 10*3/uL — ABNORMAL LOW (ref 4.0–10.5)
nRBC: 0 % (ref 0.0–0.2)

## 2023-08-11 MED ORDER — SODIUM CHLORIDE 0.9% FLUSH
10.0000 mL | Freq: Once | INTRAVENOUS | Status: AC
Start: 2023-08-11 — End: 2023-08-11
  Administered 2023-08-11: 10 mL

## 2023-08-11 MED ORDER — PROCHLORPERAZINE MALEATE 10 MG PO TABS
10.0000 mg | ORAL_TABLET | Freq: Once | ORAL | Status: AC
  Administered 2023-08-11: 10 mg via ORAL
  Filled 2023-08-11: qty 1

## 2023-08-11 MED ORDER — SODIUM CHLORIDE 0.9% FLUSH
10.0000 mL | INTRAVENOUS | Status: DC | PRN
  Administered 2023-08-11: 10 mL

## 2023-08-11 MED ORDER — HEPARIN SOD (PORK) LOCK FLUSH 100 UNIT/ML IV SOLN
500.0000 [IU] | Freq: Once | INTRAVENOUS | Status: AC | PRN
  Administered 2023-08-11: 500 [IU]

## 2023-08-11 MED ORDER — SODIUM CHLORIDE 0.9 % IV SOLN: Freq: Once | INTRAVENOUS | Status: AC

## 2023-08-11 MED ORDER — SODIUM CHLORIDE 0.9 % IV SOLN
1000.0000 mg/m2 | Freq: Once | INTRAVENOUS | Status: AC
  Administered 2023-08-11: 1672 mg via INTRAVENOUS
  Filled 2023-08-11: qty 43.97

## 2023-08-11 NOTE — Telephone Encounter (Signed)
CALLED PATIENT TO ASK ABOUT COMING FOR FU APPT. WITH DR. KINARD ON 08-21-23 DUE TO DR. KINARD BEING IN THE OR, RESCHEDULED FOR 9:15 AM, LVM FOR A RETURN CALL

## 2023-08-12 LAB — CANCER ANTIGEN 19-9: CA 19-9: 56 U/mL — ABNORMAL HIGH (ref 0–35)

## 2023-08-14 ENCOUNTER — Ambulatory Visit: Payer: Self-pay | Admitting: Radiation Oncology

## 2023-08-19 ENCOUNTER — Encounter: Payer: Self-pay | Admitting: Radiation Oncology

## 2023-08-20 NOTE — Progress Notes (Signed)
  Radiation Oncology         [phone]) [phone] ________________________________  Name: [name] MRN: [id number]  Date: 08/21/2023  DOB: Nov 11, 1940  End of Treatment Note  Diagnosis: New medial right middle lobe nodule concerning for non-small cell lung cancer recurrence. Recent PET scan also showed nodules in the RLL and RUL without hypermetabolism   History of stage IA (T1c, N0, M0) non-small cell lung cancer, adenocarcinoma with lymphovascular invasion diagnosed in 2019, s/p left upper lobectomy      Indication for treatment: Curative        Radiation treatment dates: First Treatment Date: 2023-06-30 - Last Treatment Date: 2023-07-14  Site/Dose/Technique/Mode:   Site: Right lower lung  Technique: SBRT/SRT-IMRT Mode: Photon Dose Per Fraction: 18 Gy Prescribed Dose (Delivered / Prescribed): 54 Gy / 54 Gy Prescribed Fxs (Delivered / Prescribed): 3 / 3  Site: Right upper lung  Technique: SBRT/SRT-IMRT Mode: Photon Dose Per Fraction: 18 Gy Prescribed Dose (Delivered / Prescribed): 54 Gy / 54 Gy Prescribed Fxs (Delivered / Prescribed): 3 / 3  Site: Right middle lung Technique: IMRT Mode: Photon Dose Per Fraction: 5 Gy Prescribed Dose (Delivered / Prescribed): 50 Gy / 50 Gy Prescribed Fxs (Delivered / Prescribed): 10 / 10  Narrative: The patient tolerated radiation treatment relatively well other than some fatigue and a mild cough which she attributed to allergies.   Plan: The patient has completed radiation treatment. The patient will return to radiation oncology clinic for routine followup in one month. I advised them to call or return sooner if they have any questions or concerns related to their recovery or treatment.  -----------------------------------  Billie Lade, PhD, MD  This document serves as a record of services personally performed by Antony Blackbird, MD. It was created on his behalf by Neena Rhymes, a trained medical scribe. The creation of this  record is based on the scribe's personal observations and the provider's statements to them. This document has been checked and approved by the attending provider.

## 2023-08-20 NOTE — Progress Notes (Signed)
Radiation Oncology         [phone] ________________________________  Name: [name] MRN: [id number]  Date: 08/21/2023  DOB: 04-08-1941  Follow-Up Visit Note  CC: Beam, Chales Salmon, MD  Charlett Lango C, *  No diagnosis found.  Diagnosis: New medial right middle lobe nodule concerning for non-small cell lung cancer recurrence. Recent PET scan also showed nodules in the RLL and RUL without hypermetabolism   History of stage IA (T1c, N0, M0) non-small cell lung cancer, adenocarcinoma with lymphovascular invasion diagnosed in 2019, s/p left upper lobectomy      Concurrent diagnosis of adenocarcinoma of the pancreas - currently undergoing chemotherapy  Interval Since Last Radiation: 1 month and 8 days   Indication for treatment: Curative         Radiation treatment dates: First Treatment Date: 2023-06-30 - Last Treatment Date: 2023-07-14   Site/Dose/Technique/Mode:    Site: Right lower lung  Technique: SBRT/SRT-IMRT Mode: Photon Dose Per Fraction: 18 Gy Prescribed Dose (Delivered / Prescribed): 54 Gy / 54 Gy Prescribed Fxs (Delivered / Prescribed): 3 / 3   Site: Right upper lung  Technique: SBRT/SRT-IMRT Mode: Photon Dose Per Fraction: 18 Gy Prescribed Dose (Delivered / Prescribed): 54 Gy / 54 Gy Prescribed Fxs (Delivered / Prescribed): 3 / 3   Site: Right middle lung Technique: IMRT Mode: Photon Dose Per Fraction: 5 Gy Prescribed Dose (Delivered / Prescribed): 50 Gy / 50 Gy Prescribed Fxs (Delivered / Prescribed): 10 / 10  Narrative:  The patient returns today for routine follow-up. She tolerated radiation treatment relatively well other than some fatigue and a mild cough which she attributed to allergies.   Since her consultation date of 04/30/23, she underwent an EUS on 05/22/23 for evaluation of a pancreatic lesion that was noted on her initial PET scan performed this past July. FNA of the pancreatic head lesion unfortunately showed findings  consistent with adenocarcinoma.  She also underwent a polypectomy and stomach biopsy which were both negative for dysplasia or malignancy. Procedural findings noted the mass to abut the gastroduodenal artery but not involve with the SMA or celiac trunk. No malignant appearing lymph nodes were noted.   Prior to her EUS, she underwent an MRI of the abdomen on 05/04/23 which confirmed the suspicious solid lesion within the pancreatic head measuring 3.0 x 2.4 x 2.4 cm, correlating with PET findings. Associated main duct dilatation within the head through the tail of pancreas was also demonstrated.  The known tracer avid right middle lobe lung nodule was also redemonstrated, measuring approximately 1.7 cm. She also presented for an MRI of the brain on 05/04/23 which showed no evidence of intracranial metastatic disease.         She as accordingly referred to Dr. Mosetta Putt and began chemotherapy consisting of Gemcitabine on 06/17/23. Dr. Mosetta Putt has discussed the possibility of consolidation radiation therapy if she has no cancer progression on her next restaging CT. The patient was also initially offered the option of Whipple surgery but declined due to her advanced age and concurrent diagnosis of lung cancer.    She recently completed her 3rd cycle of chemotherapy on 08/11/23 and will have restaging imaging performed after she completes her 4th cycle.            ***     Allergies:  is allergic to [employer] tartrate], sulfa antibiotics, codeine, hydrocodone, and meperidine.  Meds: Current Outpatient Medications  Medication Sig Dispense Refill   Calcium Carb-Cholecalciferol (CALCIUM-VITAMIN D3) 600-200 MG-UNIT TABS  Take by mouth daily.     cetirizine (ZYRTEC) 10 MG tablet Take 10 mg by mouth as needed for allergies.     diphenhydramine-acetaminophen (TYLENOL PM) 25-500 MG TABS tablet Take 2 tablets by mouth at bedtime as needed (for sleep).     escitalopram (LEXAPRO) 20 MG tablet Take by mouth daily.      lidocaine-prilocaine (EMLA) cream Apply to affected area once 30 g 3   metoprolol succinate (TOPROL-XL) 50 MG 24 hr tablet Take 50 mg by mouth daily. Take with or immediately following a meal.     Multiple Vitamins-Minerals (MULTIVITAMIN PO) Take 1 tablet by mouth daily.     ondansetron (ZOFRAN) 8 MG tablet Take 1 tablet (8 mg total) by mouth every 8 (eight) hours as needed for nausea or vomiting. 30 tablet 1   pantoprazole (PROTONIX) 40 MG tablet Take 40 mg by mouth daily.     polyethylene glycol (MIRALAX / GLYCOLAX) packet Take 8.5 g by mouth every other day.     prochlorperazine (COMPAZINE) 10 MG tablet Take 1 tablet (10 mg total) by mouth every 6 (six) hours as needed for nausea or vomiting. 30 tablet 1   simvastatin (ZOCOR) 20 MG tablet Take 20 mg by mouth daily.     triamcinolone ointment (KENALOG) 0.5 % Apply 1 Application topically 2 (two) times daily. 30 g 0   No current facility-administered medications for this encounter.    Physical Findings: The patient is in no acute distress. Patient is alert and oriented.  vitals were not taken for this visit. .  No significant changes. Lungs are clear to auscultation bilaterally. Heart has regular rate and rhythm. No palpable cervical, supraclavicular, or axillary adenopathy. Abdomen soft, non-tender, normal bowel sounds.   Lab Findings: Lab Results  Component Value Date   WBC 2.5 (L) 08/11/2023   HGB 9.7 (L) 08/11/2023   HCT 28.3 (L) 08/11/2023   MCV 98.6 08/11/2023   PLT 276 08/11/2023    Radiographic Findings: No results found.  Impression:  New medial right middle lobe nodule concerning for non-small cell lung cancer recurrence. Recent PET scan also showed nodules in the RLL and RUL without hypermetabolism   History of stage IA (T1c, N0, M0) non-small cell lung cancer, adenocarcinoma with lymphovascular invasion diagnosed in 2019, s/p left upper lobectomy      The patient is recovering from the effects of radiation.   ***  Plan:  ***   *** minutes of total time was spent for this patient encounter, including preparation, face-to-face counseling with the patient and coordination of care, physical exam, and documentation of the encounter. ____________________________________  Billie Lade, PhD, MD  This document serves as a record of services personally performed by Antony Blackbird, MD. It was created on his behalf by Neena Rhymes, a trained medical scribe. The creation of this record is based on the scribe's personal observations and the provider's statements to them. This document has been checked and approved by the attending provider.

## 2023-08-21 ENCOUNTER — Encounter: Payer: Self-pay | Admitting: Radiation Oncology

## 2023-08-21 ENCOUNTER — Ambulatory Visit
Admission: RE | Admit: 2023-08-21 | Discharge: 2023-08-21 | Disposition: A | Discharge status: Home / Self Care | Payer: Medicare Other | Source: Ambulatory Visit | Attending: Radiation Oncology | Admitting: Radiation Oncology

## 2023-08-21 VITALS — BP 133/74 | HR 96 | Temp 97.6°F | Resp 24 | Ht 62.0 in | Wt 140.0 lb

## 2023-08-21 DIAGNOSIS — Z923 Personal history of irradiation: Secondary | ICD-10-CM | POA: Insufficient documentation

## 2023-08-21 DIAGNOSIS — Z85118 Personal history of other malignant neoplasm of bronchus and lung: Secondary | ICD-10-CM | POA: Diagnosis not present

## 2023-08-21 DIAGNOSIS — C342 Malignant neoplasm of middle lobe, bronchus or lung: Secondary | ICD-10-CM | POA: Insufficient documentation

## 2023-08-21 DIAGNOSIS — C3492 Malignant neoplasm of unspecified part of left bronchus or lung: Secondary | ICD-10-CM

## 2023-08-21 HISTORY — DX: Personal history of irradiation: Z92.3

## 2023-08-21 NOTE — Progress Notes (Signed)
[name] is here today for follow up post radiation to the lung.  Lung Side: Right, patient completed treatment on 07/14/23  Does the patient complain of any of the following: Pain: Denies pain Shortness of breath w/wo exertion: Denies Cough: She reports coughing intermittently Hemoptysis: Denies Pain with swallowing: Denies Swallowing/choking concerns: Denies Appetite: Poor Energy Level: Poor Post radiation skin Changes: None  BP 133/74 (BP Location: Right Arm, Patient Position: Sitting, Cuff Size: Normal)   Pulse 96   Temp 97.6 F (36.4 C)   Resp (!) 24   Ht 5\' 2"  (1.575 m)   Wt 140 lb (63.5 kg)   SpO2 96%   BMI 25.61 kg/m      Additional comments if applicable:

## 2023-08-24 NOTE — Assessment & Plan Note (Signed)
 cT3N0M0 -Incidental findings on the CT scan for lung cancer screening/follow-up -Diagnosed in September 2024 by EUS baseline CA 19.9 256 -Patient has synchronized 2 hypermetabolic lesion in her lungs, highly suspicious for malignancy. -Patient declined Whipple surgery due to her advanced age and diagnosis of lung cancer at the same time. -Patient agreed with low intensity chemotherapy with single agent gemcitabine and started on June 17, 2023, for a total of 3 to 4 months, followed by consolidation radiation if no cancer progression on next restaging CT.

## 2023-08-25 ENCOUNTER — Inpatient Hospital Stay: Payer: Medicare Other

## 2023-08-25 ENCOUNTER — Encounter: Payer: Self-pay | Admitting: Hematology

## 2023-08-25 ENCOUNTER — Inpatient Hospital Stay (HOSPITAL_BASED_OUTPATIENT_CLINIC_OR_DEPARTMENT_OTHER): Payer: Medicare Other | Admitting: Hematology

## 2023-08-25 VITALS — BP 138/68 | HR 100 | Temp 97.9°F | Resp 17 | Wt 140.8 lb

## 2023-08-25 VITALS — HR 93

## 2023-08-25 DIAGNOSIS — C250 Malignant neoplasm of head of pancreas: Secondary | ICD-10-CM

## 2023-08-25 DIAGNOSIS — Z95828 Presence of other vascular implants and grafts: Secondary | ICD-10-CM

## 2023-08-25 DIAGNOSIS — Z5111 Encounter for antineoplastic chemotherapy: Secondary | ICD-10-CM | POA: Diagnosis not present

## 2023-08-25 LAB — CBC WITH DIFFERENTIAL (CANCER CENTER ONLY)
Abs Immature Granulocytes: 0.11 10*3/uL — ABNORMAL HIGH (ref 0.00–0.07)
Basophils Absolute: 0 10*3/uL (ref 0.0–0.1)
Basophils Relative: 0 %
Eosinophils Absolute: 0 10*3/uL (ref 0.0–0.5)
Eosinophils Relative: 0 %
HCT: 26.8 % — ABNORMAL LOW (ref 36.0–46.0)
Hemoglobin: 9.3 g/dL — ABNORMAL LOW (ref 12.0–15.0)
Immature Granulocytes: 1 %
Lymphocytes Relative: 3 %
Lymphs Abs: 0.3 10*3/uL — ABNORMAL LOW (ref 0.7–4.0)
MCH: 35 pg — ABNORMAL HIGH (ref 26.0–34.0)
MCHC: 34.7 g/dL (ref 30.0–36.0)
MCV: 100.8 fL — ABNORMAL HIGH (ref 80.0–100.0)
Monocytes Absolute: 0.5 10*3/uL (ref 0.1–1.0)
Monocytes Relative: 4 %
Neutro Abs: 11.6 10*3/uL — ABNORMAL HIGH (ref 1.7–7.7)
Neutrophils Relative %: 92 %
Platelet Count: 408 10*3/uL — ABNORMAL HIGH (ref 150–400)
RBC: 2.66 MIL/uL — ABNORMAL LOW (ref 3.87–5.11)
RDW: 17 % — ABNORMAL HIGH (ref 11.5–15.5)
WBC Count: 12.5 10*3/uL — ABNORMAL HIGH (ref 4.0–10.5)
nRBC: 0.2 % (ref 0.0–0.2)

## 2023-08-25 LAB — CMP (CANCER CENTER ONLY)
ALT: 12 U/L (ref 0–44)
AST: 20 U/L (ref 15–41)
Albumin: 3.6 g/dL (ref 3.5–5.0)
Alkaline Phosphatase: 65 U/L (ref 38–126)
Anion gap: 8 (ref 5–15)
BUN: 15 mg/dL (ref 8–23)
CO2: 25 mmol/L (ref 22–32)
Calcium: 9.1 mg/dL (ref 8.9–10.3)
Chloride: 97 mmol/L — ABNORMAL LOW (ref 98–111)
Creatinine: 0.74 mg/dL (ref 0.44–1.00)
GFR, Estimated: >60 mL/min (ref >60)
Glucose, Bld: 227 mg/dL — ABNORMAL HIGH (ref 70–99)
Potassium: 4.2 mmol/L (ref 3.5–5.1)
Sodium: 130 mmol/L — ABNORMAL LOW (ref 135–145)
Total Bilirubin: 0.8 mg/dL (ref <1.2)
Total Protein: 6.2 g/dL — ABNORMAL LOW (ref 6.5–8.1)

## 2023-08-25 MED ORDER — PROCHLORPERAZINE MALEATE 10 MG PO TABS
10.0000 mg | ORAL_TABLET | Freq: Once | ORAL | Status: AC
  Administered 2023-08-25: 10 mg via ORAL
  Filled 2023-08-25: qty 1

## 2023-08-25 MED ORDER — HEPARIN SOD (PORK) LOCK FLUSH 100 UNIT/ML IV SOLN
500.0000 [IU] | Freq: Once | INTRAVENOUS | Status: AC | PRN
  Administered 2023-08-25: 500 [IU]

## 2023-08-25 MED ORDER — SODIUM CHLORIDE 0.9% FLUSH
10.0000 mL | Freq: Once | INTRAVENOUS | Status: AC
  Administered 2023-08-25: 10 mL

## 2023-08-25 MED ORDER — SODIUM CHLORIDE 0.9% FLUSH
10.0000 mL | INTRAVENOUS | Status: DC | PRN
  Administered 2023-08-25: 10 mL

## 2023-08-25 MED ORDER — SODIUM CHLORIDE 0.9 % IV SOLN
1000.0000 mg/m2 | Freq: Once | INTRAVENOUS | Status: AC
  Administered 2023-08-25: 1672 mg via INTRAVENOUS
  Filled 2023-08-25: qty 43.97

## 2023-08-25 MED ORDER — SODIUM CHLORIDE 0.9 % IV SOLN
Freq: Once | INTRAVENOUS | Status: AC
Start: 2023-08-25 — End: 2023-08-25

## 2023-08-25 NOTE — Patient Instructions (Signed)
 [facility] - A DEPT OF [facility]  Discharge Instructions: Thank you for choosing [facility] to provide your oncology and hematology care.   If you have a lab appointment with the Cancer Center, please go directly to the Cancer Center and check in at the registration area.   Wear comfortable clothing and clothing appropriate for easy access to any Portacath or PICC line.   We strive to give you quality time with your provider. You may need to reschedule your appointment if you arrive late (15 or more minutes).  Arriving late affects you and other patients whose appointments are after yours.  Also, if you miss three or more appointments without notifying the office, you may be dismissed from the clinic at the provider's discretion.      For prescription refill requests, have your pharmacy contact our office and allow 72 hours for refills to be completed.    Today you received the following chemotherapy and/or immunotherapy agents Gemzar      To help prevent nausea and vomiting after your treatment, we encourage you to take your nausea medication as directed.  BELOW ARE SYMPTOMS THAT SHOULD BE REPORTED IMMEDIATELY: *FEVER GREATER THAN 100.4 F (38 C) OR HIGHER *CHILLS OR SWEATING *NAUSEA AND VOMITING THAT IS NOT CONTROLLED WITH YOUR NAUSEA MEDICATION *UNUSUAL SHORTNESS OF BREATH *UNUSUAL BRUISING OR BLEEDING *URINARY PROBLEMS (pain or burning when urinating, or frequent urination) *BOWEL PROBLEMS (unusual diarrhea, constipation, pain near the anus) TENDERNESS IN MOUTH AND THROAT WITH OR WITHOUT PRESENCE OF ULCERS (sore throat, sores in mouth, or a toothache) UNUSUAL RASH, SWELLING OR PAIN  UNUSUAL VAGINAL DISCHARGE OR ITCHING   Items with * indicate a potential emergency and should be followed up as soon as possible or go to the Emergency Department if any problems should occur.  Please show the CHEMOTHERAPY ALERT CARD or IMMUNOTHERAPY  ALERT CARD at check-in to the Emergency Department and triage nurse.  Should you have questions after your visit or need to cancel or reschedule your appointment, please contact [facility] - A DEPT OF [facility]  Dept: [phone]  and follow the prompts.  Office hours are 8:00 a.m. to 4:30 p.m. Monday - Friday. Please note that voicemails left after 4:00 p.m. may not be returned until the following business day.  We are closed weekends and major holidays. You have access to a nurse at all times for urgent questions. Please call the main number to the clinic Dept: [phone] and follow the prompts.   For any non-urgent questions, you may also contact your provider using MyChart. We now offer e-Visits for anyone 81 and older to request care online for non-urgent symptoms. For details visit [email or web address].   Also download the MyChart app! Go to the app store, search "MyChart", open the app, select [place], and log in with your MyChart username and password.

## 2023-08-25 NOTE — Progress Notes (Signed)
[facility]   Telephone:(336) [phone] Fax:([phone]) [phone]   Clinic Follow up Note   Patient Care Team: Beam, Chales Salmon, MD as PCP - General (Family Medicine) Corky Crafts, MD as PCP - Cardiology (Cardiology) Loreli Slot, MD as Consulting Physician (Cardiothoracic Surgery) Malachy Mood, MD as Consulting Physician (Oncology) Pollyann Samples, NP as Nurse Practitioner (Nurse Practitioner) Antony Blackbird, MD as Consulting Physician (Radiation Oncology)  Date of Service:  08/25/2023  CHIEF COMPLAINT: f/u of pancreatic cancer  CURRENT THERAPY:  Chemotherapy gemcitabine on day 1 and 8 every 21 days  Oncology History   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 -Patient declined Whipple surgery due to her advanced age and diagnosis of lung cancer at the same time. -Patient agreed with low intensity chemotherapy with single agent gemcitabine and started on June 17, 2023, for a total of 3 to 4 months, followed by consolidation radiation if no cancer progression on next restaging CT.   Assessment and Plan    Pancreatic Cancer 82 year old with pancreatic cancer undergoing gemcitabine chemotherapy. Reports cold sensations in the afternoons without fever, mild anemia, fatigue, and decreased energy levels. Scheduled for fiducial placement and endoscopy in February. CT scan pending. Discussed gemcitabine side effects and emphasized high protein, high calorie diet with nutritional supplements like Boost. Patient remains functional at home, able to grocery shop and drive. - Administer chemotherapy today and next week, then off for a week - Schedule CT scan for early January, including chest, abdomen, and pelvis - Monitor blood counts and  kidney/liver function - Encourage high protein, high calorie diet - Next appointment on September 01, 2023 - Follow-up appointment on September 16, 2023  Fatigue Fatigue likely multifactorial due to age, chemotherapy, and radiation. Mild anemia noted. Discussed importance of staying active and maintaining a high protein, high calorie diet. - Encourage high protein, high calorie diet - Consider nutritional supplements like Ensure or Boost - Monitor blood counts  Chronic Rhinitis Reports nasal congestion and drainage, particularly at night when the heat is on. Symptoms are chronic. - Consider environmental modifications to reduce nasal congestion  General Health Maintenance Patient is generally able to function at home, perform grocery shopping, and drive. - Encourage staying active - Monitor overall health and well-being  Plan -Lab reviewed, adequate for treatment, will proceed cycle 4-day 1 chemotherapy today -She will return next week for day 8 chemo - Schedule CT scan for early January, including chest, abdomen, and pelvis.         SUMMARY OF ONCOLOGIC HISTORY: Oncology History  Adenocarcinoma of left lung, stage 1 (HCC)  07/06/2018 Initial Diagnosis   Adenocarcinoma of left lung, stage 1 (HCC)   02/15/2021 Cancer Staging   Staging form: Lung, AJCC 8th Edition - Clinical: Stage IA3 (cT1c, cN0, cM0) - Signed by Si Gaul, MD on 02/15/2021   Adenocarcinoma of head of pancreas (HCC)  03/03/2023 Imaging   CT chest IMPRESSION: 1. Enlarging and concerning right lung nodules, the largest in the right middle lobe measuring up to 1.4 cm in diameter, highly suspicious for primary bronchogenic carcinoma. The right middle lobe nodule is large enough to evaluate by PET-CT which is recommended. 2. No evidence of thoracic adenopathy or pleural effusion. No suspicious left lung nodule status post upper lobe resection. 3. Aortic Atherosclerosis (  ICD10-I70.0) and Emphysema (ICD10-J43.9).    03/28/2023 PET scan   IMPRESSION: 1. 12 mm medial right middle lobe pulmonary nodule is hypermetabolic and consistent with primary neoplasm. 2. 7 mm right lower lobe nodule and 7 mm right upper lobe nodule do not demonstrate any hypermetabolism but will need close surveillance. 3. No mediastinal or hilar lymphadenopathy. 4. No findings for osseous metastatic disease. 5. Abnormal appearance of the pancreatic head and mild hypermetabolism worrisome for neoplasm. Recommend MRI abdomen without and with contrast for further evaluation. Aortic Atherosclerosis (ICD10-I70.0).   05/04/2023 Imaging   ABD MRI IMPRESSION: 1. Suspicious, solid lesion within the head of pancreas 3.0 x 2.4 x 2.4 cm. There is associated main duct dilatation within the head through tail of pancreas. Suspected lesion corresponds to the area of increased tracer uptake noted on the recent PET-CT. Findings are concerning for pancreatic adenocarcinoma. Motion artifact on the postcontrast images diminishes exam detail within the pancreas. Difficult to exclude involvement of the portal vein. The celiac trunk and proximal SMA appear uninvolved. Consider further evaluation with endoscopic ultrasound and possibly CTA of the abdomen. 2. Scattered T2 hyperintense and T1 hypointense foci within the liver are favored to represent multiple cysts. Unfortunately, motion artifact diminishes exam detail within the liver for the evaluation underlying enhancing liver lesions. Within this limitation, no obvious suspicious lesion identified at this time. 3. Right middle lobe lung nodule is again identified as seen on the previous PET-CT.   05/04/2023 Imaging   Brain MRI IMPRESSION: No evidence of intracranial metastatic disease.   05/16/2023 Tumor Marker   CA 19-9: 256   05/22/2023 Cancer Staging   Staging form: Exocrine Pancreas, AJCC 8th Edition - Clinical stage from 05/22/2023: Stage IB (cT2, cN0, cM0) - Signed by Pollyann Samples,  NP on 06/09/2023 Stage prefix: Initial diagnosis Total positive nodes: 0   05/22/2023 Procedure   EUS by Dr. Meridee Score: 29 x 28 mm mass in the pancreatic head appearing suspicious for adeno, no malignant appearing lymph nodes were visualized.  Abuts the gastroduodenal artery but not involved with the SMA or celiac trunk.  T2 N0 by EUS    05/22/2023 Initial Biopsy    A. PANCREAS, HEAD LESION, FINE NEEDLE ASPIRATION:  FINAL MICROSCOPIC DIAGNOSIS:  - Adenocarcinoma    06/09/2023 Initial Diagnosis   Adenocarcinoma of head of pancreas (HCC)   06/17/2023 -  Chemotherapy   Patient is on Treatment Plan : LUNG Gemcitabine (1000) D1,8 q21d      Genetic Testing   [facility] CancerNext-Expanded Panel+RNA was Negative. Report date is 06/24/2023.   The CancerNext-Expanded gene panel offered by [facility] and includes sequencing, rearrangement, and RNA analysis for the following 71 genes: AIP, ALK, APC, ATM, AXIN2, BAP1, BARD1, BMPR1A, BRCA1, BRCA2, BRIP1, CDC73, CDH1, CDK4, CDKN1B, CDKN2A, CHEK2, CTNNA1, DICER1, FH, FLCN, KIF1B, LZTR1, MAX, MEN1, MET, MLH1, MSH2, MSH3, MSH6, MUTYH, NF1, NF2, NTHL1, PALB2, PHOX2B, PMS2, POT1, PRKAR1A, PTCH1, PTEN, RAD51C, RAD51D, RB1, RET, SDHA, SDHAF2, SDHB, SDHC, SDHD, SMAD4, SMARCA4, SMARCB1, SMARCE1, STK11, SUFU, TMEM127, TP53, TSC1, TSC2, and VHL (sequencing and deletion/duplication); EGFR, EGLN1, HOXB13, KIT, MITF, PDGFRA, POLD1, and POLE (sequencing only); EPCAM and GREM1 (deletion/duplication only).       Discussed the use of AI scribe software for clinical note transcription with the patient, who gave verbal consent to proceed.  History of Present Illness   Feliz, an 82 year old female with a history of pancreatic cancer, presents for a follow-up visit. She reports feeling down due to her sister's recent diagnosis  with stomach cancer and missing her husband during the holiday season. She also reports feeling cold in the afternoons, which persists until morning,  but she has no fever. She is able to function at home, albeit with less energy than before. She also reports some respiratory congestion, which she attributes to turning on the heat in the fall. She has a past history of smoking.         All other systems were reviewed with the patient and are negative.  MEDICAL HISTORY:  Past Medical History:  Diagnosis Date   Anxiety    Blood glucose elevated 05/27/2012   pt. 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 Antony Blackbird   HLD (hyperlipidemia) 12/03/2011   Hypertension    Lung cancer (HCC) dx'd 04/2018   Osteopenia 05/30/2015   Overview:  Bone density 05/2015 started fosamax    Primary localized osteoarthritis of left knee    Primary localized osteoarthritis of right knee 12/06/2015    SURGICAL HISTORY: Past Surgical History:  Procedure Laterality Date   ABDOMINAL HYSTERECTOMY     BIOPSY  05/22/2023   Procedure: BIOPSY;  Surgeon: Lemar Lofty., MD;  Location: [facility];  Service: Gastroenterology;;   CATARACT EXTRACTION, BILATERAL     COLONOSCOPY     ESOPHAGOGASTRODUODENOSCOPY (EGD) WITH PROPOFOL N/A 05/22/2023   Procedure: ESOPHAGOGASTRODUODENOSCOPY (EGD) WITH PROPOFOL;  Surgeon: Lemar Lofty., MD;  Location: [facility];  Service: Gastroenterology;  Laterality: N/A;   EUS N/A 05/22/2023   Procedure: UPPER ENDOSCOPIC ULTRASOUND (EUS) RADIAL;   Surgeon: Lemar Lofty., MD;  Location: [facility];  Service: Gastroenterology;  Laterality: N/A;   EYE SURGERY Bilateral    Cataract   FINE NEEDLE ASPIRATION  05/22/2023   Procedure: FINE NEEDLE ASPIRATION;  Surgeon: Meridee Score Netty Starring., MD;  Location: [facility];  Service: Gastroenterology;;   implantable loop recorder placement  10/26/2018   MDT LINQ Reveal XT ILR implanted for evaluation of palpitations and atrial fibrillation in office by Dr Johney Frame, Device SN [id number])    IR IMAGING GUIDED PORT INSERTION  06/24/2023   KNEE SURGERY  05/01/2016   left uncompartmental    PARTIAL KNEE ARTHROPLASTY Right 12/18/2015   Procedure: UNICOMPARTMENTAL RIGHT KNEE;  Surgeon: Salvatore Marvel, MD;  Location: [facility] OR;  Service: Orthopedics;  Laterality: Right;   PARTIAL KNEE ARTHROPLASTY Left 05/01/2016   Procedure: UNICOMPARTMENTAL KNEE;  Surgeon: Salvatore Marvel, MD;  Location: [facility] OR;  Service: Orthopedics;  Laterality: Left;   POLYPECTOMY  05/22/2023   Procedure: POLYPECTOMY;  Surgeon: Mansouraty, Netty Starring., MD;  Location: [facility];  Service: Gastroenterology;;   TOTAL ABDOMINAL HYSTERECTOMY W/ BILATERAL SALPINGOOPHORECTOMY     VIDEO ASSISTED THORACOSCOPY (VATS)/ LOBECTOMY Left 05/13/2018   Procedure: VIDEO ASSISTED THORACOSCOPY (VATS)/LEFT UPPER LOBECTOMY;  Surgeon: Loreli Slot, MD;  Location: [facility] OR;  Service: Thoracic;  Laterality: Left;    I  have reviewed the social history and family history with the patient and they are unchanged from previous note.  ALLERGIES:  is allergic to [employer] tartrate], sulfa antibiotics, codeine, hydrocodone, and meperidine.  MEDICATIONS:  Current Outpatient Medications  Medication Sig Dispense Refill   Calcium Carb-Cholecalciferol (CALCIUM-VITAMIN D3) 600-200 MG-UNIT TABS Take by mouth daily.     cetirizine (ZYRTEC) 10 MG tablet Take 10 mg by mouth as needed for allergies.     diphenhydramine-acetaminophen (TYLENOL PM) 25-500 MG  TABS tablet Take 2 tablets by mouth at bedtime as needed (for sleep).     escitalopram (LEXAPRO) 20 MG tablet Take by mouth daily.     lidocaine-prilocaine (EMLA) cream Apply to affected area once 30 g 3   metoprolol succinate (TOPROL-XL) 50 MG 24 hr tablet Take 50 mg by mouth daily. Take with or immediately following a meal.     Multiple Vitamins-Minerals (MULTIVITAMIN PO) Take 1 tablet by mouth daily.     ondansetron (ZOFRAN) 8 MG tablet Take 1 tablet (8 mg total) by mouth every 8 (eight) hours as needed for nausea or vomiting. 30 tablet 1   pantoprazole (PROTONIX) 40 MG tablet Take 40 mg by mouth daily.     polyethylene glycol (MIRALAX / GLYCOLAX) packet Take 8.5 g by mouth every other day.     prochlorperazine (COMPAZINE) 10 MG tablet Take 1 tablet (10 mg total) by mouth every 6 (six) hours as needed for nausea or vomiting. 30 tablet 1   simvastatin (ZOCOR) 20 MG tablet Take 20 mg by mouth daily.     triamcinolone ointment (KENALOG) 0.5 % Apply 1 Application topically 2 (two) times daily. (Patient not taking: Reported on 08/21/2023) 30 g 0   No current facility-administered medications for this visit.    PHYSICAL EXAMINATION: ECOG PERFORMANCE STATUS: 2 - Symptomatic, <50% confined to bed  Vitals:   08/25/23 1133  BP: 138/68  Pulse: 100  Resp: 17  Temp: 97.9 F (36.6 C)  SpO2: 94%   Wt Readings from Last 3 Encounters:  08/25/23 140 lb 12.8 oz (63.9 kg)  08/21/23 140 lb (63.5 kg)  08/11/23 142 lb 3.2 oz (64.5 kg)     GENERAL:alert, no distress and comfortable SKIN: skin color, texture, turgor are normal, no rashes or significant lesions EYES: normal, Conjunctiva are pink and non-injected, sclera clear NECK: supple, thyroid normal size, non-tender, without nodularity LYMPH:  no palpable lymphadenopathy in the cervical, axillary  LUNGS: clear to auscultation and percussion with normal breathing effort HEART: regular rate & rhythm and no murmurs and no lower extremity  edema ABDOMEN:abdomen soft, non-tender and normal bowel sounds Musculoskeletal:no cyanosis of digits and no clubbing  NEURO: alert & oriented x 3 with fluent speech, no focal motor/sensory deficits   LABORATORY DATA:  I have reviewed the data as listed    Latest Ref Rng & Units 08/25/2023   11:15 AM 08/11/2023   12:39 PM 08/04/2023   11:11 AM  CBC  WBC 4.0 - 10.5 K/uL 12.5  2.5  8.5   Hemoglobin 12.0 - 15.0 g/dL 9.3  9.7  44.0   Hematocrit 36.0 - 46.0 % 26.8  28.3  30.5   Platelets 150 - 400 K/uL 408  276  213         Latest Ref Rng & Units 08/25/2023   11:15 AM 08/11/2023   12:39 PM 08/04/2023   11:11 AM  CMP  Glucose 70 - 99 mg/dL 102  725  366   BUN 8 -  23 mg/dL 15  14  17    Creatinine 0.44 - 1.00 mg/dL 0.34  7.42  5.95   Sodium 135 - 145 mmol/L 130  136  135   Potassium 3.5 - 5.1 mmol/L 4.2  3.7  4.0   Chloride 98 - 111 mmol/L 97  101  101   CO2 22 - 32 mmol/L 25  27  26    Calcium 8.9 - 10.3 mg/dL 9.1  9.3  9.8   Total Protein 6.5 - 8.1 g/dL 6.2  6.2  6.8   Total Bilirubin <1.2 mg/dL 0.8  0.4  0.5   Alkaline Phos 38 - 126 U/L 65  69  75   AST 15 - 41 U/L 20  42  23   ALT 0 - 44 U/L 12  28  12        RADIOGRAPHIC STUDIES: I have personally reviewed the radiological images as listed and agreed with the findings in the report. No results found.    No orders of the defined types were placed in this encounter.  All questions were answered. The patient knows to call the clinic with any problems, questions or concerns. No barriers to learning was detected. The total time spent in the appointment was 25 minutes.     Malachy Mood, MD 08/25/2023

## 2023-08-31 NOTE — Assessment & Plan Note (Addendum)
cT3N0M0 -Incidental findings on the CT scan for lung cancer screening/follow-up -Diagnosed in September 2024 by EUS baseline CA 19.9 256 -Patient has synchronized 2 hypermetabolic lesion in her lungs, highly suspicious for malignancy. -Patient declined Whipple surgery due to her advanced age and diagnosis of lung cancer at the same time. -Patient agreed with low intensity chemotherapy with single agent gemcitabine and started on June 17, 2023, for a total of 3 to 4 months, followed by consolidation radiation if no cancer progression on next restaging CT. -Next restaging CT abdomen pelvis is scheduled for 09/08/2023.

## 2023-08-31 NOTE — Assessment & Plan Note (Signed)
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 -pt started SBRT on 06/30/2023 and completed on 07/14/2023

## 2023-08-31 NOTE — Progress Notes (Unsigned)
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 -Patient declined Whipple surgery due to her advanced age and diagnosis of lung cancer at the same time. -Patient agreed with low intensity chemotherapy with single agent gemcitabine and started on June 17, 2023, for a total of 3 to 4 months, followed by consolidation radiation if no cancer progression on next restaging CT. -Next restaging CT abdomen pelvis is scheduled for 09/08/2023.  Adenocarcinoma of left lung, stage 1 (HCC) -She had a history of stage Ia non-small cell lung cancer/adenocarcinoma of left lung, status post lobectomy in September 2019 -On surveillance CT scan in June 2024, she was found to have 7 mm right middle lobe nodule, increased to 12 mm and hypermetabolic on PET scan in September 2024, she also has 2 additional 7 mm lung nodules which were not hypermetabolic on PET scan. -pt started SBRT on 06/30/2023 and completed on 07/14/2023    Plan: Labs reviewed  -CBC showing WBC 5.4; Hgb 8.7; Hct 25.8; Plt 320; Anc 4.2 -CMP - K 4.1; glucose 163; BUN 15; Creatinine 0.81; eGFR > 60; Ca 9.4; AST 44; ALT 33; ALKP 69.   -Will add ferritin, iron, and TIBC to next check.  May consider iron infusion to boost hemoglobin and  improve fatigue. Trial of Flonase, using 1-2 squirts in both nostrils daily to improve nasal congestion. Labs and patient's status are adequate for treatment today.  This is cycle 4 day 8 of gemcitabine. Restaging scan scheduled for 09/08/2023. Labs/flush, follow-up, and treatment as scheduled.  The patient understands the plans discussed today and is in agreement with them.  She knows to contact our office if she develops concerns prior to her next appointment.  I provided 25 minutes of face-to-face time during this encounter and > 50% was spent counseling as documented under my assessment and plan.    Carlean Jews, NP  [facility] - A DEPT OF [facility] [address] [place] [address] Dept: [phone] Dept Fax: [phone]   Orders Placed This Encounter  Procedures   Ferritin    Standing Status:   Future    Expected Date:   09/15/2023    Expiration Date:   08/31/2024   Iron and Iron Binding Capacity ([facility] only)    Standing Status:   Future    Expected Date:   09/15/2023    Expiration Date:   08/31/2024      CHIEF COMPLAINT:  CC: Pancreatic cancer  Current Treatment: Chemotherapy gemcitabine on day 1 and day 8 every 28 days  INTERVAL HISTORY:  [name] is here today for repeat clinical assessment.  She was last seen by Dr. Mosetta Putt on 08/25/2023.  Today is cycle 4 day 8.  She is scheduled for restaging scan on 09/08/2023.  After that, she is already set up for  EUS with Dr. Irish Lack Roddy in February 2025.  Today, she has concerns of chronic rhinitis.  Feels like she is blowing her nose constantly.  Sometimes mucus with blood-tinged.  Has chronic cough.  Mucus is generally clear that she is coughing up.  States that last week when she was here, she felt very fatigued.  Did home.  Was "feeling sorry for herself."  Her sister had recently been diagnosed with stomach cancer.  Was overwhelming for both she and  her sister.  Patient reports feeling better.  Back on track.  States that her appetite is still not great.  Gets hungry, but not able to eat very much at 1 time.  Gets easily fatigued.  After rest, able to get back to being more active.  She denies chest pain, chest pressure, or shortness of breath. She denies headaches or visual disturbances. She denies abdominal pain, nausea, vomiting, or changes in bowel or bladder habits.  She denies fevers or chills. She denies pain. Her weight has decreased 1 pounds over last week .  I have reviewed the past medical history, past surgical history, social history and family history with the patient and they are unchanged from previous note.  ALLERGIES:  is allergic to [employer] tartrate], sulfa antibiotics, codeine, hydrocodone, and meperidine.  MEDICATIONS:  Current Outpatient Medications  Medication Sig Dispense Refill   Calcium Carb-Cholecalciferol (CALCIUM-VITAMIN D3) 600-200 MG-UNIT TABS Take by mouth daily.     cetirizine (ZYRTEC) 10 MG tablet Take 10 mg by mouth as needed for allergies.     diphenhydramine-acetaminophen (TYLENOL PM) 25-500 MG TABS tablet Take 2 tablets by mouth at bedtime as needed (for sleep).     escitalopram (LEXAPRO) 20 MG tablet Take by mouth daily.     fluticasone (FLONASE) 50 MCG/ACT nasal spray Place 1-2 sprays into both nostrils daily. 16 mL 2   lidocaine-prilocaine (EMLA) cream Apply to affected area once 30 g 3   metoprolol succinate (TOPROL-XL) 50 MG 24 hr tablet Take 50 mg by mouth daily. Take with or immediately following a meal.     Multiple Vitamins-Minerals (MULTIVITAMIN PO) Take 1 tablet by mouth daily.     ondansetron (ZOFRAN) 8 MG tablet Take 1 tablet (8 mg total) by mouth every 8 (eight) hours as needed for nausea or vomiting. 30 tablet 1   pantoprazole (PROTONIX) 40 MG tablet Take 40 mg by mouth daily.     polyethylene glycol (MIRALAX / GLYCOLAX) packet Take 8.5 g by mouth every other day.      prochlorperazine (COMPAZINE) 10 MG tablet Take 1 tablet (10 mg total) by mouth every 6 (six) hours as needed for nausea or vomiting. 30 tablet 1   simvastatin (ZOCOR) 20 MG tablet Take 20 mg by mouth daily.     triamcinolone ointment (KENALOG) 0.5 % Apply 1 Application topically 2 (two) times daily. 30 g 0   No current facility-administered medications for this visit.   Facility-Administered Medications Ordered in Other Visits  Medication Dose Route Frequency Provider Last Rate Last Admin   0.9 %  sodium chloride infusion   Intravenous Once Malachy Mood, MD       gemcitabine (GEMZAR) 1,672 mg in sodium chloride 0.9 % 250 mL chemo infusion  1,000 mg/m2 (Treatment Plan Recorded) Intravenous Once Malachy Mood, MD       heparin lock flush 100 unit/mL  500 Units Intracatheter Once PRN Malachy Mood, MD       sodium chloride flush (NS) 0.9 % injection 10  mL  10 mL Intracatheter PRN Malachy Mood, MD        HISTORY OF PRESENT ILLNESS:   Oncology History  Adenocarcinoma of left lung, stage 1 (HCC)  07/06/2018 Initial Diagnosis   Adenocarcinoma of left lung, stage 1 (HCC)   02/15/2021 Cancer Staging   Staging form: Lung, AJCC 8th Edition - Clinical: Stage IA3 (cT1c, cN0, cM0) - Signed by Si Gaul, MD on 02/15/2021   Adenocarcinoma of head of pancreas (HCC)  03/03/2023 Imaging   CT chest IMPRESSION: 1. Enlarging and concerning right lung nodules, the largest in the right middle lobe measuring up to 1.4 cm in diameter, highly suspicious for primary bronchogenic carcinoma. The right middle lobe nodule is large enough to evaluate by PET-CT which is recommended. 2. No evidence of thoracic adenopathy or pleural effusion. No suspicious left lung nodule status post upper lobe resection. 3. Aortic Atherosclerosis (ICD10-I70.0) and Emphysema (ICD10-J43.9).   03/28/2023 PET scan   IMPRESSION: 1. 12 mm medial right middle lobe pulmonary nodule is hypermetabolic and consistent with primary neoplasm. 2. 7 mm right  lower lobe nodule and 7 mm right upper lobe nodule do not demonstrate any hypermetabolism but will need close surveillance. 3. No mediastinal or hilar lymphadenopathy. 4. No findings for osseous metastatic disease. 5. Abnormal appearance of the pancreatic head and mild hypermetabolism worrisome for neoplasm. Recommend MRI abdomen without and with contrast for further evaluation. Aortic Atherosclerosis (ICD10-I70.0).   05/04/2023 Imaging   ABD MRI IMPRESSION: 1. Suspicious, solid lesion within the head of pancreas 3.0 x 2.4 x 2.4 cm. There is associated main duct dilatation within the head through tail of pancreas. Suspected lesion corresponds to the area of increased tracer uptake noted on the recent PET-CT. Findings are concerning for pancreatic adenocarcinoma. Motion artifact on the postcontrast images diminishes exam detail within the pancreas. Difficult to exclude involvement of the portal vein. The celiac trunk and proximal SMA appear uninvolved. Consider further evaluation with endoscopic ultrasound and possibly CTA of the abdomen. 2. Scattered T2 hyperintense and T1 hypointense foci within the liver are favored to represent multiple cysts. Unfortunately, motion artifact diminishes exam detail within the liver for the evaluation underlying enhancing liver lesions. Within this limitation, no obvious suspicious lesion identified at this time. 3. Right middle lobe lung nodule is again identified as seen on the previous PET-CT.   05/04/2023 Imaging   Brain MRI IMPRESSION: No evidence of intracranial metastatic disease.   05/16/2023 Tumor Marker   CA 19-9: 256   05/22/2023 Cancer Staging   Staging form: Exocrine Pancreas, AJCC 8th Edition - Clinical stage from 05/22/2023: Stage IB (cT2, cN0, cM0) - Signed by Pollyann Samples, NP on 06/09/2023 Stage prefix: Initial diagnosis Total positive nodes: 0   05/22/2023 Procedure   EUS by Dr. Meridee Score: 29 x 28 mm mass in the pancreatic  head appearing suspicious for adeno, no malignant appearing lymph nodes were visualized.  Abuts the gastroduodenal artery but not involved with the SMA or celiac trunk.  T2 N0 by EUS    05/22/2023 Initial Biopsy    A. PANCREAS, HEAD LESION, FINE NEEDLE ASPIRATION:  FINAL MICROSCOPIC DIAGNOSIS:  - Adenocarcinoma    06/09/2023 Initial Diagnosis   Adenocarcinoma of head of pancreas (HCC)   06/17/2023 -  Chemotherapy   Patient is on Treatment Plan : LUNG Gemcitabine (1000) D1,8 q21d      Genetic Testing   [facility] CancerNext-Expanded Panel+RNA was Negative. Report date is 06/24/2023.   The CancerNext-Expanded gene panel offered by  [facility] and includes sequencing, rearrangement, and RNA analysis for the following 71 genes: AIP, ALK, APC, ATM, AXIN2, BAP1, BARD1, BMPR1A, BRCA1, BRCA2, BRIP1, CDC73, CDH1, CDK4, CDKN1B, CDKN2A, CHEK2, CTNNA1, DICER1, FH, FLCN, KIF1B, LZTR1, MAX, MEN1, MET, MLH1, MSH2, MSH3, MSH6, MUTYH, NF1, NF2, NTHL1, PALB2, PHOX2B, PMS2, POT1, PRKAR1A, PTCH1, PTEN, RAD51C, RAD51D, RB1, RET, SDHA, SDHAF2, SDHB, SDHC, SDHD, SMAD4, SMARCA4, SMARCB1, SMARCE1, STK11, SUFU, TMEM127, TP53, TSC1, TSC2, and VHL (sequencing and deletion/duplication); EGFR, EGLN1, HOXB13, KIT, MITF, PDGFRA, POLD1, and POLE (sequencing only); EPCAM and GREM1 (deletion/duplication only).        REVIEW OF SYSTEMS:   Constitutional: Denies fevers, chills or abnormal weight loss Eyes: Denies blurriness of vision Ears, nose, mouth, throat, and face: Denies mucositis or sore throat. Has chronic nasal congestion.  Respiratory: denies SOB. Does have chronic cough.  Cardiovascular: Denies palpitation, chest discomfort or lower extremity swelling Gastrointestinal:  Denies nausea, heartburn or change in bowel habits Skin: Denies abnormal skin rashes Lymphatics: Denies new lymphadenopathy or easy bruising Neurological:Denies numbness, tingling or new weaknesses Behavioral/Psych: Mood is stable, no new changes.  Mood improved since last week.  All other systems were reviewed with the patient and are negative.   Today's Vitals   09/01/23 1302 09/01/23 1303  BP: 125/71   Pulse: 80   TempSrc: Temporal   SpO2: 98%   Weight: 139 lb 12.8 oz (63.4 kg)   PainSc:  0-No pain   Body mass index is 25.57 kg/m.   Wt Readings from Last 3 Encounters:  09/01/23 139 lb 12.8 oz (63.4 kg)  08/25/23 140 lb 12.8 oz (63.9 kg)  08/21/23 140 lb (63.5 kg)    Body mass index is 25.57 kg/m.  Performance status (ECOG): 1 - Symptomatic but completely ambulatory  PHYSICAL EXAM:   GENERAL:alert, no distress and comfortable SKIN: skin color, texture, turgor are normal, no rashes or significant lesions EYES: normal, Conjunctiva are pink and non-injected, sclera clear OROPHARYNX:no exudate, no erythema and lips, buccal mucosa, and tongue normal  NECK: supple, thyroid normal size, non-tender, without nodularity LYMPH:  no palpable lymphadenopathy in the cervical, axillary or inguinal LUNGS: clear to auscultation and percussion with normal breathing effort HEART: regular rate & rhythm and no murmurs and no lower extremity edema ABDOMEN:abdomen soft, non-tender and normal bowel sounds Musculoskeletal:no cyanosis of digits and no clubbing  NEURO: alert & oriented x 3 with fluent speech, no focal motor/sensory deficits  LABORATORY DATA:  I have reviewed the data as listed    Component Value Date/Time   NA 129 (L) 09/01/2023 1208   K 4.1 09/01/2023 1208   CL 97 (L) 09/01/2023 1208   CO2 24 09/01/2023 1208   GLUCOSE 163 (H) 09/01/2023 1208   BUN 15 09/01/2023 1208   CREATININE 0.81 09/01/2023 1208   CALCIUM 9.4 09/01/2023 1208   PROT 6.0 (L) 09/01/2023 1208   ALBUMIN 3.5 09/01/2023 1208   AST 44 (H) 09/01/2023 1208   ALT 33 09/01/2023 1208   ALKPHOS 69 09/01/2023 1208   BILITOT 0.5 09/01/2023 1208   GFRNONAA >60 09/01/2023 1208   GFRAA >60 01/28/2020 1231   Lab Results  Component Value Date   WBC 5.4  09/01/2023   NEUTROABS 4.2 09/01/2023   HGB 8.7 (L) 09/01/2023   HCT 25.8 (L) 09/01/2023   MCV 100.0 09/01/2023   PLT 320 09/01/2023

## 2023-09-01 ENCOUNTER — Inpatient Hospital Stay (HOSPITAL_BASED_OUTPATIENT_CLINIC_OR_DEPARTMENT_OTHER): Payer: Medicare Other | Admitting: Nurse Practitioner

## 2023-09-01 ENCOUNTER — Encounter: Payer: Self-pay | Admitting: Nurse Practitioner

## 2023-09-01 ENCOUNTER — Inpatient Hospital Stay: Payer: Medicare Other

## 2023-09-01 VITALS — BP 125/71 | HR 80 | Wt 139.8 lb

## 2023-09-01 DIAGNOSIS — C3492 Malignant neoplasm of unspecified part of left bronchus or lung: Secondary | ICD-10-CM | POA: Diagnosis not present

## 2023-09-01 DIAGNOSIS — C250 Malignant neoplasm of head of pancreas: Secondary | ICD-10-CM

## 2023-09-01 DIAGNOSIS — Z95828 Presence of other vascular implants and grafts: Secondary | ICD-10-CM

## 2023-09-01 DIAGNOSIS — Z5111 Encounter for antineoplastic chemotherapy: Secondary | ICD-10-CM | POA: Diagnosis not present

## 2023-09-01 LAB — CMP (CANCER CENTER ONLY)
ALT: 33 U/L (ref 0–44)
AST: 44 U/L — ABNORMAL HIGH (ref 15–41)
Albumin: 3.5 g/dL (ref 3.5–5.0)
Alkaline Phosphatase: 69 U/L (ref 38–126)
Anion gap: 8 (ref 5–15)
BUN: 15 mg/dL (ref 8–23)
CO2: 24 mmol/L (ref 22–32)
Calcium: 9.4 mg/dL (ref 8.9–10.3)
Chloride: 97 mmol/L — ABNORMAL LOW (ref 98–111)
Creatinine: 0.81 mg/dL (ref 0.44–1.00)
GFR, Estimated: >60 mL/min (ref >60)
Glucose, Bld: 163 mg/dL — ABNORMAL HIGH (ref 70–99)
Potassium: 4.1 mmol/L (ref 3.5–5.1)
Sodium: 129 mmol/L — ABNORMAL LOW (ref 135–145)
Total Bilirubin: 0.5 mg/dL (ref <1.2)
Total Protein: 6 g/dL — ABNORMAL LOW (ref 6.5–8.1)

## 2023-09-01 LAB — CBC WITH DIFFERENTIAL (CANCER CENTER ONLY)
Abs Immature Granulocytes: 0.08 10*3/uL — ABNORMAL HIGH (ref 0.00–0.07)
Basophils Absolute: 0 10*3/uL (ref 0.0–0.1)
Basophils Relative: 0 %
Eosinophils Absolute: 0 10*3/uL (ref 0.0–0.5)
Eosinophils Relative: 0 %
HCT: 25.8 % — ABNORMAL LOW (ref 36.0–46.0)
Hemoglobin: 8.7 g/dL — ABNORMAL LOW (ref 12.0–15.0)
Immature Granulocytes: 2 %
Lymphocytes Relative: 8 %
Lymphs Abs: 0.4 10*3/uL — ABNORMAL LOW (ref 0.7–4.0)
MCH: 33.7 pg (ref 26.0–34.0)
MCHC: 33.7 g/dL (ref 30.0–36.0)
MCV: 100 fL (ref 80.0–100.0)
Monocytes Absolute: 0.7 10*3/uL (ref 0.1–1.0)
Monocytes Relative: 13 %
Neutro Abs: 4.2 10*3/uL (ref 1.7–7.7)
Neutrophils Relative %: 77 %
Platelet Count: 320 10*3/uL (ref 150–400)
RBC: 2.58 MIL/uL — ABNORMAL LOW (ref 3.87–5.11)
RDW: 16.8 % — ABNORMAL HIGH (ref 11.5–15.5)
WBC Count: 5.4 10*3/uL (ref 4.0–10.5)
nRBC: 0.6 % — ABNORMAL HIGH (ref 0.0–0.2)

## 2023-09-01 MED ORDER — SODIUM CHLORIDE 0.9% FLUSH
10.0000 mL | Freq: Once | INTRAVENOUS | Status: AC
  Administered 2023-09-01: 10 mL

## 2023-09-01 MED ORDER — SODIUM CHLORIDE 0.9 % IV SOLN: Freq: Once | INTRAVENOUS | Status: DC

## 2023-09-01 MED ORDER — FLUTICASONE PROPIONATE 50 MCG/ACT NA SUSP: 1.0000 | Freq: Every day | NASAL | 2 refills | Status: DC

## 2023-09-01 MED ORDER — HEPARIN SOD (PORK) LOCK FLUSH 100 UNIT/ML IV SOLN
500.0000 [IU] | Freq: Once | INTRAVENOUS | Status: AC | PRN
  Administered 2023-09-01: 500 [IU]

## 2023-09-01 MED ORDER — SODIUM CHLORIDE 0.9% FLUSH
10.0000 mL | INTRAVENOUS | Status: DC | PRN
Start: 2023-09-01 — End: 2023-09-01
  Administered 2023-09-01: 10 mL

## 2023-09-01 MED ORDER — SODIUM CHLORIDE 0.9 % IV SOLN: Freq: Once | INTRAVENOUS | Status: AC

## 2023-09-01 MED ORDER — PROCHLORPERAZINE MALEATE 10 MG PO TABS
10.0000 mg | ORAL_TABLET | Freq: Once | ORAL | Status: AC
  Administered 2023-09-01: 10 mg via ORAL
  Filled 2023-09-01: qty 1

## 2023-09-01 MED ORDER — GEMCITABINE HCL CHEMO INJECTION 1 GM/26.3ML
1000.0000 mg/m2 | Freq: Once | INTRAVENOUS | Status: AC
  Administered 2023-09-01: 1672 mg via INTRAVENOUS
  Filled 2023-09-01: qty 43.97

## 2023-09-01 NOTE — Patient Instructions (Signed)
 [facility]  Discharge Instructions: Thank you for choosing [facility] to provide your oncology and hematology care.   If you have a lab appointment with the Cancer Center, please go directly to the Cancer Center and check in at the registration area.   Wear comfortable clothing and clothing appropriate for easy access to any Portacath or PICC line.   We strive to give you quality time with your provider. You may need to reschedule your appointment if you arrive late (15 or more minutes).  Arriving late affects you and other patients whose appointments are after yours.  Also, if you miss three or more appointments without notifying the office, you may be dismissed from the clinic at the provider's discretion.      For prescription refill requests, have your pharmacy contact our office and allow 72 hours for refills to be completed.    Today you received the following chemotherapy and/or immunotherapy agents: Gemcitabine      To help prevent nausea and vomiting after your treatment, we encourage you to take your nausea medication as directed.  BELOW ARE SYMPTOMS THAT SHOULD BE REPORTED IMMEDIATELY: *FEVER GREATER THAN 100.4 F (38 C) OR HIGHER *CHILLS OR SWEATING *NAUSEA AND VOMITING THAT IS NOT CONTROLLED WITH YOUR NAUSEA MEDICATION *UNUSUAL SHORTNESS OF BREATH *UNUSUAL BRUISING OR BLEEDING *URINARY PROBLEMS (pain or burning when urinating, or frequent urination) *BOWEL PROBLEMS (unusual diarrhea, constipation, pain near the anus) TENDERNESS IN MOUTH AND THROAT WITH OR WITHOUT PRESENCE OF ULCERS (sore throat, sores in mouth, or a toothache) UNUSUAL RASH, SWELLING OR PAIN  UNUSUAL VAGINAL DISCHARGE OR ITCHING   Items with * indicate a potential emergency and should be followed up as soon as possible or go to the Emergency Department if any problems should occur.  Please show the CHEMOTHERAPY ALERT CARD or  IMMUNOTHERAPY ALERT CARD at check-in to the Emergency Department and triage nurse.  Should you have questions after your visit or need to cancel or reschedule your appointment, please contact [facility] A [facility]  Dept: [phone]  and follow the prompts.  Office hours are 8:00 a.m. to 4:30 p.m. Monday - Friday. Please note that voicemails left after 4:00 p.m. may not be returned until the following business day.  We are closed weekends and major holidays. You have access to a nurse at all times for urgent questions. Please call the main number to the clinic Dept: [phone] and follow the prompts.   For any non-urgent questions, you may also contact your provider using MyChart. We now offer e-Visits for anyone 56 and older to request care online for non-urgent symptoms. For details visit [email or web address].   Also download the MyChart app! Go to the app store, search "MyChart", open the app, select [place], and log in with your MyChart username and password.

## 2023-09-08 ENCOUNTER — Encounter (HOSPITAL_COMMUNITY): Payer: Self-pay

## 2023-09-08 ENCOUNTER — Ambulatory Visit (HOSPITAL_COMMUNITY)
Admission: RE | Admit: 2023-09-08 | Discharge: 2023-09-08 | Disposition: A | Discharge status: Home / Self Care | Payer: Medicare Other | Source: Ambulatory Visit | Attending: Hematology | Admitting: Hematology

## 2023-09-08 DIAGNOSIS — C250 Malignant neoplasm of head of pancreas: Secondary | ICD-10-CM | POA: Diagnosis present

## 2023-09-08 MED ORDER — IOHEXOL 300 MG/ML  SOLN
75.0000 mL | Freq: Once | INTRAMUSCULAR | Status: AC | PRN
  Administered 2023-09-08: 75 mL via INTRAVENOUS

## 2023-09-08 MED ORDER — SODIUM CHLORIDE (PF) 0.9 % IJ SOLN
INTRAMUSCULAR | Status: AC
  Filled 2023-09-08: qty 100

## 2023-09-14 NOTE — Progress Notes (Signed)
 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 -Patient declined Whipple surgery due to her advanced age and diagnosis of lung cancer at the same time. -Patient agreed with low intensity chemotherapy with single agent gemcitabine  and started on June 17, 2023, for a total of 3 to 4 months, followed by consolidation radiation if no cancer progression on next restaging CT. -Restaging CT of abdomen pelvis done 09/08/2023. It showed similar to minimal decrease in size of the pancreatic head mass. No vascular involvement or metastatic disease identified. Apparent gastric body wall thickening could be due to underdistention. Correlate with symptoms of gastritis. Incidental findings, including: Aortic atherosclerosis (ICD10-I70.0) and emphysema (ICD10-J43.9). Small hiatal hernia. - 09/16/2023 -start cycle 5 for day 1.  This is her last full cycle of gemcitabine .  She will talk more with Dr. Lanny about consolidation radiation at next visit prior to final day of chemotherapy.  She is already scheduled to have fiduciary's placed under EUS with Dr. Wilhelmenia in February 2025.    Plan: Labs reviewed  -CBC showing WBC 8.4; Hgb 9.2; Hct 27.8; Plt 298; Anc 5.7 -CMP - K 4.0; glucose 182; BUN 15;  Creatinine 0.74; eGFR > 60; Ca 8.7; LFTs normal.   -Ferritin, iron panel, and CA 19 9 are pending. -Reviewed restaging CT of the abdomen and pelvis with patient.  It shows stable, if slightly improved size of pancreatic head mass.  No evidence of vascular involvement or metastatic disease were identified.  Given no disease progression, we will proceed with cycle 5 of chemotherapy with gemcitabine .  Will likely pursue consolidation radiation within the next 6 to 8 weeks.  She is already scheduled for EUS evaluation and fiduciary placement with Dr. Wilhelmenia in February 2025. Add albuterol  rescue inhaler.  May use 1 to 2 inhalations up to 3 times a day for shortness of breath and wheezing. Labs and patient condition satisfactory to proceed with cycle 5 day 1 gemcitabine . Labs/flush, follow-up, and cycle 5 day 8 of gemcitabine  as scheduled on 09/22/2023.  The patient understands the plans discussed today and is in agreement with them.  She knows to contact our office if she develops concerns prior to her next appointment.  I provided 30 minutes of face-to-face time during this encounter and > 50% was spent counseling as documented under my assessment and plan.    Powell FORBES Lessen, NP  [facility] - A DEPT OF [facility] [address] [place] [address] Dept: [phone] Dept Fax: [phone]   No orders of the defined types were placed in this encounter.     CHIEF COMPLAINT:  CC: Adenocarcinoma of head of pancreas.  Current Treatment: Chemotherapy gemcitabine  on days 1 and 8 of a 28-day cycle  INTERVAL HISTORY:  [name] is here today for repeat clinical assessment.  She  was last seen by myself on 09/01/2023.  Restaging CT CAP done 09/08/2023.  A CT scan shows similar to have minimal decrease in size of the pancreatic head mass with no vascular involvement or metastatic disease identified.  There is some gastric body  wall thickening which could be due to underdistention.  Consider diagnosis of gastritis.  Incidental findings include aortic atherosclerosis and emphysema.  The patient reports some increased shortness of breath and wheezing.  She said her appetite has been so-so however she has maintained her weight.  Today is cycle 5 day 1 of gemcitabine .  She denies fevers or chills. She denies pain. She denies chest pain or chest pressure. She denies headaches or visual disturbances. She denies abdominal pain, nausea, vomiting, or changes in bowel or bladder habits.    I have reviewed the past medical history, past surgical history, social history and family history with the patient and they are unchanged from previous note.  ALLERGIES:  is allergic to ambien  [zolpidem  tartrate], sulfa antibiotics, codeine, hydrocodone , and meperidine.  MEDICATIONS:  Current Outpatient Medications  Medication Sig Dispense Refill   albuterol  (VENTOLIN  HFA) 108 (90 Base) MCG/ACT inhaler Inhale 1-2 puffs into the lungs 3 (three) times daily as needed for wheezing or shortness of breath. 1 each 0   Calcium  Carb-Cholecalciferol (CALCIUM -VITAMIN D3) 600-200 MG-UNIT TABS Take by mouth daily.     cetirizine (ZYRTEC) 10 MG tablet Take 10 mg by mouth as needed for allergies.     diphenhydramine -acetaminophen  (TYLENOL  PM) 25-500 MG TABS tablet Take 2 tablets by mouth at bedtime as needed (for sleep).     escitalopram (LEXAPRO) 20 MG tablet Take by mouth daily.     fluticasone  (FLONASE ) 50 MCG/ACT nasal spray Place 1-2 sprays into both nostrils daily. 16 mL 2   lidocaine -prilocaine  (EMLA ) cream Apply to affected area once 30 g 3   metoprolol  succinate (TOPROL -XL) 50 MG 24 hr tablet Take 50 mg by mouth daily. Take with or immediately following a meal.     Multiple Vitamins-Minerals (MULTIVITAMIN PO) Take 1 tablet by mouth daily.     ondansetron  (ZOFRAN ) 8 MG tablet Take 1 tablet (8 mg total) by mouth every 8 (eight) hours as needed for  nausea or vomiting. 30 tablet 1   pantoprazole (PROTONIX) 40 MG tablet Take 40 mg by mouth daily.     polyethylene glycol (MIRALAX  / GLYCOLAX ) packet Take 8.5 g by mouth every other day.     prochlorperazine  (COMPAZINE ) 10 MG tablet Take 1 tablet (10 mg total) by mouth every 6 (six) hours as needed for nausea or vomiting. 30 tablet 1   simvastatin  (ZOCOR ) 20 MG tablet Take 20 mg by mouth daily.     triamcinolone  ointment (KENALOG ) 0.5 % Apply 1 Application topically 2 (two) times daily. 30 g 0   No current facility-administered medications for this visit.    HISTORY OF PRESENT ILLNESS:   Oncology History  Adenocarcinoma of left lung, stage 1 (HCC)  07/06/2018 Initial Diagnosis   Adenocarcinoma of left lung, stage 1 (HCC)   02/15/2021 Cancer Staging   Staging form: Lung, AJCC 8th Edition - Clinical: Stage IA3 (cT1c, cN0, cM0) - Signed by Sherrod Sherrod, MD on 02/15/2021   Adenocarcinoma of head of pancreas (HCC)  03/03/2023 Imaging   CT chest IMPRESSION: 1. Enlarging and concerning right lung nodules, the largest in the right middle lobe measuring up to 1.4 cm in diameter, highly suspicious for primary bronchogenic carcinoma. The right middle lobe nodule is large  enough to evaluate by PET-CT which is recommended. 2. No evidence of thoracic adenopathy or pleural effusion. No suspicious left lung nodule status post upper lobe resection. 3. Aortic Atherosclerosis (ICD10-I70.0) and Emphysema (ICD10-J43.9).   03/28/2023 PET scan   IMPRESSION: 1. 12 mm medial right middle lobe pulmonary nodule is hypermetabolic and consistent with primary neoplasm. 2. 7 mm right lower lobe nodule and 7 mm right upper lobe nodule do not demonstrate any hypermetabolism but will need close surveillance. 3. No mediastinal or hilar lymphadenopathy. 4. No findings for osseous metastatic disease. 5. Abnormal appearance of the pancreatic head and mild hypermetabolism worrisome for neoplasm. Recommend MRI  abdomen without and with contrast for further evaluation. Aortic Atherosclerosis (ICD10-I70.0).   05/04/2023 Imaging   ABD MRI IMPRESSION: 1. Suspicious, solid lesion within the head of pancreas 3.0 x 2.4 x 2.4 cm. There is associated main duct dilatation within the head through tail of pancreas. Suspected lesion corresponds to the area of increased tracer uptake noted on the recent PET-CT. Findings are concerning for pancreatic adenocarcinoma. Motion artifact on the postcontrast images diminishes exam detail within the pancreas. Difficult to exclude involvement of the portal vein. The celiac trunk and proximal SMA appear uninvolved. Consider further evaluation with endoscopic ultrasound and possibly CTA of the abdomen. 2. Scattered T2 hyperintense and T1 hypointense foci within the liver are favored to represent multiple cysts. Unfortunately, motion artifact diminishes exam detail within the liver for the evaluation underlying enhancing liver lesions. Within this limitation, no obvious suspicious lesion identified at this time. 3. Right middle lobe lung nodule is again identified as seen on the previous PET-CT.   05/04/2023 Imaging   Brain MRI IMPRESSION: No evidence of intracranial metastatic disease.   05/16/2023 Tumor Marker   CA 19-9: 256   05/22/2023 Cancer Staging   Staging form: Exocrine Pancreas, AJCC 8th Edition - Clinical stage from 05/22/2023: Stage IB (cT2, cN0, cM0) - Signed by Burton, Lacie K, NP on 06/09/2023 Stage prefix: Initial diagnosis Total positive nodes: 0   05/22/2023 Procedure   EUS by Dr. Wilhelmenia: 29 x 28 mm mass in the pancreatic head appearing suspicious for adeno, no malignant appearing lymph nodes were visualized.  Abuts the gastroduodenal artery but not involved with the SMA or celiac trunk.  T2 N0 by EUS    05/22/2023 Initial Biopsy    A. PANCREAS, HEAD LESION, FINE NEEDLE ASPIRATION:  FINAL MICROSCOPIC DIAGNOSIS:  - Adenocarcinoma    06/09/2023  Initial Diagnosis   Adenocarcinoma of head of pancreas (HCC)   06/17/2023 -  Chemotherapy   Patient is on Treatment Plan : LUNG Gemcitabine  (1000) D1,8 q21d      Genetic Testing   [facility] CancerNext-Expanded Panel+RNA was Negative. Report date is 06/24/2023.   The CancerNext-Expanded gene panel offered by [facility] and includes sequencing, rearrangement, and RNA analysis for the following 71 genes: AIP, ALK, APC, ATM, AXIN2, BAP1, BARD1, BMPR1A, BRCA1, BRCA2, BRIP1, CDC73, CDH1, CDK4, CDKN1B, CDKN2A, CHEK2, CTNNA1, DICER1, FH, FLCN, KIF1B, LZTR1, MAX, MEN1, MET, MLH1, MSH2, MSH3, MSH6, MUTYH, NF1, NF2, NTHL1, PALB2, PHOX2B, PMS2, POT1, PRKAR1A, PTCH1, PTEN, RAD51C, RAD51D, RB1, RET, SDHA, SDHAF2, SDHB, SDHC, SDHD, SMAD4, SMARCA4, SMARCB1, SMARCE1, STK11, SUFU, TMEM127, TP53, TSC1, TSC2, and VHL (sequencing and deletion/duplication); EGFR, EGLN1, HOXB13, KIT, MITF, PDGFRA, POLD1, and POLE (sequencing only); EPCAM and GREM1 (deletion/duplication only).    09/08/2023 Imaging   CT abdomen and pelvis with contrast  IMPRESSION: 1. Given cross modality comparison to the MRI of 05/04/2023, similar to minimal  decrease in size of the pancreatic head mass. No vascular involvement or metastatic disease identified. 2. Apparent gastric body wall thickening could be due to underdistention. Correlate with symptoms of gastritis. 3. Incidental findings, including: Aortic atherosclerosis (ICD10-I70.0) and emphysema (ICD10-J43.9). Small hiatal hernia.       REVIEW OF SYSTEMS:   Constitutional: Denies fevers, chills or abnormal weight loss.  Moderate fatigue Eyes: Denies blurriness of vision Ears, nose, mouth, throat, and face: Denies mucositis or sore throat Respiratory: Denies cough or dyspnea.  Does have some increased shortness of breath/wheezing recently. Cardiovascular: Denies palpitation, chest discomfort or lower extremity swelling Gastrointestinal:  Denies nausea, heartburn or change in bowel  habits Skin: Denies abnormal skin rashes Lymphatics: Denies new lymphadenopathy or easy bruising Neurological:Denies numbness, tingling or new weaknesses Behavioral/Psych: Mood is stable, no new changes  All other systems were reviewed with the patient and are negative.   VITALS:   Today's Vitals   09/16/23 1027 09/16/23 1032  BP: 139/73   Pulse: 91   Resp: 18   Temp: 98 F (36.7 C)   TempSrc: Temporal   SpO2: 96%   Weight: 138 lb 14.4 oz (63 kg)   Height: 5' 2 (1.575 m)   PainSc:  0-No pain   Body mass index is 25.41 kg/m.   Wt Readings from Last 3 Encounters:  09/16/23 138 lb 14.4 oz (63 kg)  09/01/23 139 lb 12.8 oz (63.4 kg)  08/25/23 140 lb 12.8 oz (63.9 kg)    Body mass index is 25.41 kg/m.  Performance status (ECOG): 1 - Symptomatic but completely ambulatory  PHYSICAL EXAM:   GENERAL:alert, no distress and comfortable SKIN: skin color, texture, turgor are normal, no rashes or significant lesions EYES: normal, Conjunctiva are pink and non-injected, sclera clear OROPHARYNX:no exudate, no erythema and lips, buccal mucosa, and tongue normal  NECK: supple, thyroid normal size, non-tender, without nodularity LYMPH:  no palpable lymphadenopathy in the cervical, axillary or inguinal LUNGS: clear to auscultation and percussion.  Appears more short of breath than usual.  Expiratory wheezes auscultated in bilateral lower lung lobes. HEART: regular rate & rhythm and no murmurs and no lower extremity edema ABDOMEN:abdomen soft, non-tender and normal bowel sounds Musculoskeletal:no cyanosis of digits and no clubbing  NEURO: alert & oriented x 3 with fluent speech, no focal motor/sensory deficits  LABORATORY DATA:  I have reviewed the data as listed    Component Value Date/Time   NA 133 (L) 09/16/2023 0957   K 4.0 09/16/2023 0957   CL 100 09/16/2023 0957   CO2 27 09/16/2023 0957   GLUCOSE 182 (H) 09/16/2023 0957   BUN 15 09/16/2023 0957   CREATININE 0.74 09/16/2023  0957   CALCIUM  8.7 (L) 09/16/2023 0957   PROT 6.0 (L) 09/16/2023 0957   ALBUMIN  3.3 (L) 09/16/2023 0957   AST 31 09/16/2023 0957   ALT 17 09/16/2023 0957   ALKPHOS 75 09/16/2023 0957   BILITOT 0.6 09/16/2023 0957   GFRNONAA >60 09/16/2023 0957   GFRAA >60 01/28/2020 1231    Lab Results  Component Value Date   WBC 8.4 09/16/2023   NEUTROABS 5.7 09/16/2023   HGB 9.2 (L) 09/16/2023   HCT 27.8 (L) 09/16/2023   MCV 104.1 (H) 09/16/2023   PLT 298 09/16/2023    RADIOGRAPHIC STUDIES: CT ABDOMEN PELVIS W CONTRAST Result Date: 09/15/2023 CLINICAL DATA:  Pancreatic cancer. Evaluate response to chemotherapy. Short of breath on exertion. Weight loss. History of lung cancer with radiation therapy. * Tracking Code: BO *  EXAM: CT ABDOMEN AND PELVIS WITH CONTRAST TECHNIQUE: Multidetector CT imaging of the abdomen and pelvis was performed using the standard protocol following bolus administration of intravenous contrast. RADIATION DOSE REDUCTION: This exam was performed according to the departmental dose-optimization program which includes automated exposure control, adjustment of the mA and/or kV according to patient size and/or use of iterative reconstruction technique. CONTRAST:  75mL OMNIPAQUE  IOHEXOL  300 MG/ML  SOLN COMPARISON:  05/04/2023 MRI FINDINGS: Lower chest: Emphysema. Bibasilar scarring. Mild cardiomegaly with multivessel coronary artery calcification Hepatobiliary: Well-circumscribed hypoattenuating liver lesions are likely cysts and bile duct hamartomas. Relatively similar in size and distribution to the prior MRI. Hepatomegaly at 19.0 cm craniocaudal. Normal gallbladder, without biliary ductal dilatation. Pancreas: Moderate duct dilatation involving the pancreatic tail and body are similar. Followed to the level of a pancreatic head mass which is somewhat ill-defined, estimated at 2.8 x 2.5 cm on 61/9. Compare 3.0 x 2.5 cm on the prior MRI. Spleen: Normal in size, without focal abnormality.  Adrenals/Urinary Tract: Bilateral adrenal thickening. Left renal sinus cysts. Interpolar right renal 2.7 cm cyst . In the absence of clinically indicated signs/symptoms require(s) no independent follow-up. No hydronephrosis. Normal urinary bladder. Stomach/Bowel: Small hiatal hernia. The gastric body appears thick walled but is underdistended. Example 42/9. Extensive colonic diverticulosis. Normal terminal ileum and appendix. Normal small bowel. Vascular/Lymphatic: Advanced aortic and branch vessel atherosclerosis. Celiac and SMA both uninvolved by tumor. Portal and splenic veins are patent. The superior mesenteric vein is adjacent to tumor including on 61/9, but a fat plane is maintained and there is no venous compression. No abdominopelvic adenopathy. Reproductive: Hysterectomy.  No adnexal mass. Other: No significant free fluid. Mild pelvic floor laxity. No free intraperitoneal air. No evidence of omental or peritoneal disease. Musculoskeletal: Lumbosacral spondylosis. Minimal convex left lumbar spine curvature. IMPRESSION: 1. Given cross modality comparison to the MRI of 05/04/2023, similar to minimal decrease in size of the pancreatic head mass. No vascular involvement or metastatic disease identified. 2. Apparent gastric body wall thickening could be due to underdistention. Correlate with symptoms of gastritis. 3. Incidental findings, including: Aortic atherosclerosis (ICD10-I70.0) and emphysema (ICD10-J43.9). Small hiatal hernia. Electronically Signed   By: Rockey Kilts M.D.   On: 09/15/2023 19:55

## 2023-09-14 NOTE — Assessment & Plan Note (Addendum)
 cT3N0M0 -Incidental findings on the CT scan for lung cancer screening/follow-up -Diagnosed in September 2024 by EUS baseline CA 19.9 256 -Patient has synchronized 2 hypermetabolic lesion in her lungs, highly suspicious for malignancy. -Patient declined Whipple surgery due to her advanced age and diagnosis of lung cancer at the same time. -Patient agreed with low intensity chemotherapy with single agent gemcitabine  and started on June 17, 2023, for a total of 3 to 4 months, followed by consolidation radiation if no cancer progression on next restaging CT. -Restaging CT of abdomen pelvis done 09/08/2023. It showed similar to minimal decrease in size of the pancreatic head mass. No vascular involvement or metastatic disease identified. Apparent gastric body wall thickening could be due to underdistention. Correlate with symptoms of gastritis. Incidental findings, including: Aortic atherosclerosis (ICD10-I70.0) and emphysema (ICD10-J43.9). Small hiatal hernia. - 09/16/2023 -start cycle 5 for day 1.  This is her last full cycle of gemcitabine .  She will talk more with Dr. Lanny about consolidation radiation at next visit prior to final day of chemotherapy.  She is already scheduled to have fiduciary's placed under EUS with Dr. Wilhelmenia in February 2025.

## 2023-09-16 ENCOUNTER — Inpatient Hospital Stay: Payer: Medicare Other | Attending: Internal Medicine

## 2023-09-16 ENCOUNTER — Inpatient Hospital Stay (HOSPITAL_BASED_OUTPATIENT_CLINIC_OR_DEPARTMENT_OTHER): Payer: Medicare Other | Attending: Internal Medicine | Admitting: Nurse Practitioner

## 2023-09-16 ENCOUNTER — Inpatient Hospital Stay: Payer: Medicare Other

## 2023-09-16 VITALS — BP 139/73 | HR 91 | Temp 98.0°F | Resp 18 | Ht 62.0 in | Wt 138.9 lb

## 2023-09-16 VITALS — BP 137/75 | HR 86 | Resp 18

## 2023-09-16 DIAGNOSIS — C250 Malignant neoplasm of head of pancreas: Secondary | ICD-10-CM

## 2023-09-16 DIAGNOSIS — Z5111 Encounter for antineoplastic chemotherapy: Secondary | ICD-10-CM | POA: Diagnosis present

## 2023-09-16 DIAGNOSIS — Z95828 Presence of other vascular implants and grafts: Secondary | ICD-10-CM

## 2023-09-16 DIAGNOSIS — Z85118 Personal history of other malignant neoplasm of bronchus and lung: Secondary | ICD-10-CM | POA: Diagnosis not present

## 2023-09-16 DIAGNOSIS — Z79899 Other long term (current) drug therapy: Secondary | ICD-10-CM | POA: Diagnosis not present

## 2023-09-16 DIAGNOSIS — C3492 Malignant neoplasm of unspecified part of left bronchus or lung: Secondary | ICD-10-CM

## 2023-09-16 LAB — CBC WITH DIFFERENTIAL (CANCER CENTER ONLY)
Abs Immature Granulocytes: 0.1 10*3/uL — ABNORMAL HIGH (ref 0.00–0.07)
Basophils Absolute: 0 10*3/uL (ref 0.0–0.1)
Basophils Relative: 1 %
Eosinophils Absolute: 0 10*3/uL (ref 0.0–0.5)
Eosinophils Relative: 0 %
HCT: 27.8 % — ABNORMAL LOW (ref 36.0–46.0)
Hemoglobin: 9.2 g/dL — ABNORMAL LOW (ref 12.0–15.0)
Immature Granulocytes: 1 %
Lymphocytes Relative: 22 %
Lymphs Abs: 1.8 10*3/uL (ref 0.7–4.0)
MCH: 34.5 pg — ABNORMAL HIGH (ref 26.0–34.0)
MCHC: 33.1 g/dL (ref 30.0–36.0)
MCV: 104.1 fL — ABNORMAL HIGH (ref 80.0–100.0)
Monocytes Absolute: 0.7 10*3/uL (ref 0.1–1.0)
Monocytes Relative: 8 %
Neutro Abs: 5.7 10*3/uL (ref 1.7–7.7)
Neutrophils Relative %: 68 %
Platelet Count: 298 10*3/uL (ref 150–400)
RBC: 2.67 MIL/uL — ABNORMAL LOW (ref 3.87–5.11)
RDW: 19.5 % — ABNORMAL HIGH (ref 11.5–15.5)
WBC Count: 8.4 10*3/uL (ref 4.0–10.5)
nRBC: 0 % (ref 0.0–0.2)

## 2023-09-16 LAB — CMP (CANCER CENTER ONLY)
ALT: 17 U/L (ref 0–44)
AST: 31 U/L (ref 15–41)
Albumin: 3.3 g/dL — ABNORMAL LOW (ref 3.5–5.0)
Alkaline Phosphatase: 75 U/L (ref 38–126)
Anion gap: 6 (ref 5–15)
BUN: 15 mg/dL (ref 8–23)
CO2: 27 mmol/L (ref 22–32)
Calcium: 8.7 mg/dL — ABNORMAL LOW (ref 8.9–10.3)
Chloride: 100 mmol/L (ref 98–111)
Creatinine: 0.74 mg/dL (ref 0.44–1.00)
GFR, Estimated: >60 mL/min (ref >60)
Glucose, Bld: 182 mg/dL — ABNORMAL HIGH (ref 70–99)
Potassium: 4 mmol/L (ref 3.5–5.1)
Sodium: 133 mmol/L — ABNORMAL LOW (ref 135–145)
Total Bilirubin: 0.6 mg/dL (ref 0.0–1.2)
Total Protein: 6 g/dL — ABNORMAL LOW (ref 6.5–8.1)

## 2023-09-16 LAB — IRON AND IRON BINDING CAPACITY (CC-WL,HP ONLY)
Iron: 39 ug/dL (ref 28–170)
Saturation Ratios: 12 % (ref 10.4–31.8)
TIBC: 339 ug/dL (ref 250–450)
UIBC: 300 ug/dL (ref 148–442)

## 2023-09-16 LAB — FERRITIN: Ferritin: 792 ng/mL — ABNORMAL HIGH (ref 11–307)

## 2023-09-16 MED ORDER — HEPARIN SOD (PORK) LOCK FLUSH 100 UNIT/ML IV SOLN
500.0000 [IU] | Freq: Once | INTRAVENOUS | Status: AC | PRN
  Administered 2023-09-16: 500 [IU]

## 2023-09-16 MED ORDER — ALBUTEROL SULFATE HFA 108 (90 BASE) MCG/ACT IN AERS: 1.0000 | INHALATION_SPRAY | Freq: Three times a day (TID) | RESPIRATORY_TRACT | 0 refills | Status: AC | PRN

## 2023-09-16 MED ORDER — SODIUM CHLORIDE 0.9% FLUSH
10.0000 mL | Freq: Once | INTRAVENOUS | Status: AC
  Administered 2023-09-16: 10 mL

## 2023-09-16 MED ORDER — SODIUM CHLORIDE 0.9% FLUSH
10.0000 mL | INTRAVENOUS | Status: DC | PRN
Start: 2023-09-16 — End: 2023-09-16
  Administered 2023-09-16: 10 mL

## 2023-09-16 MED ORDER — SODIUM CHLORIDE 0.9 % IV SOLN
1000.0000 mg/m2 | Freq: Once | INTRAVENOUS | Status: AC
  Administered 2023-09-16: 1672 mg via INTRAVENOUS
  Filled 2023-09-16: qty 43.97

## 2023-09-16 MED ORDER — PROCHLORPERAZINE MALEATE 10 MG PO TABS
10.0000 mg | ORAL_TABLET | Freq: Once | ORAL | Status: AC
  Administered 2023-09-16: 10 mg via ORAL
  Filled 2023-09-16: qty 1

## 2023-09-16 MED ORDER — SODIUM CHLORIDE 0.9 % IV SOLN: Freq: Once | INTRAVENOUS | Status: AC

## 2023-09-16 NOTE — Patient Instructions (Signed)
 [facility] - A DEPT OF [facility]  Discharge Instructions: Thank you for choosing [facility] to provide your oncology and hematology care.   If you have a lab appointment with the Cancer Center, please go directly to the Cancer Center and check in at the registration area.   Wear comfortable clothing and clothing appropriate for easy access to any Portacath or PICC line.   We strive to give you quality time with your provider. You may need to reschedule your appointment if you arrive late (15 or more minutes).  Arriving late affects you and other patients whose appointments are after yours.  Also, if you miss three or more appointments without notifying the office, you may be dismissed from the clinic at the provider's discretion.      For prescription refill requests, have your pharmacy contact our office and allow 72 hours for refills to be completed.    Today you received the following chemotherapy and/or immunotherapy agents: Gemcitabine.       To help prevent nausea and vomiting after your treatment, we encourage you to take your nausea medication as directed.  BELOW ARE SYMPTOMS THAT SHOULD BE REPORTED IMMEDIATELY: *FEVER GREATER THAN 100.4 F (38 C) OR HIGHER *CHILLS OR SWEATING *NAUSEA AND VOMITING THAT IS NOT CONTROLLED WITH YOUR NAUSEA MEDICATION *UNUSUAL SHORTNESS OF BREATH *UNUSUAL BRUISING OR BLEEDING *URINARY PROBLEMS (pain or burning when urinating, or frequent urination) *BOWEL PROBLEMS (unusual diarrhea, constipation, pain near the anus) TENDERNESS IN MOUTH AND THROAT WITH OR WITHOUT PRESENCE OF ULCERS (sore throat, sores in mouth, or a toothache) UNUSUAL RASH, SWELLING OR PAIN  UNUSUAL VAGINAL DISCHARGE OR ITCHING   Items with * indicate a potential emergency and should be followed up as soon as possible or go to the Emergency Department if any problems should occur.  Please show the CHEMOTHERAPY ALERT CARD or  IMMUNOTHERAPY ALERT CARD at check-in to the Emergency Department and triage nurse.  Should you have questions after your visit or need to cancel or reschedule your appointment, please contact [facility] - A DEPT OF [facility]  Dept: [phone]  and follow the prompts.  Office hours are 8:00 a.m. to 4:30 p.m. Monday - Friday. Please note that voicemails left after 4:00 p.m. may not be returned until the following business day.  We are closed weekends and major holidays. You have access to a nurse at all times for urgent questions. Please call the main number to the clinic Dept: [phone] and follow the prompts.   For any non-urgent questions, you may also contact your provider using MyChart. We now offer e-Visits for anyone 19 and older to request care online for non-urgent symptoms. For details visit [email or web address].   Also download the MyChart app! Go to the app store, search "MyChart", open the app, select [facility], and log in with your MyChart username and password.

## 2023-09-17 LAB — CANCER ANTIGEN 19-9: CA 19-9: 79 U/mL — ABNORMAL HIGH (ref 0–35)

## 2023-09-21 ENCOUNTER — Encounter: Payer: Self-pay | Admitting: Hematology

## 2023-09-21 ENCOUNTER — Encounter: Payer: Self-pay | Admitting: Nurse Practitioner

## 2023-09-21 NOTE — Assessment & Plan Note (Signed)
 cT3N0M0 -Incidental findings on the CT scan for lung cancer screening/follow-up -Diagnosed in September 2024 by EUS baseline CA 19.9 256 -Patient has synchronized 2 hypermetabolic lesion in her lungs, highly suspicious for malignancy. -Patient declined Whipple surgery due to her advanced age and diagnosis of lung cancer at the same time. -Patient agreed with low intensity chemotherapy with single agent gemcitabine and started on June 17, 2023, for a total of 3 to 4 months, followed by consolidation radiation if no cancer progression on next restaging CT. -restaging CT 09/08/2023 showed stable disease

## 2023-09-22 ENCOUNTER — Inpatient Hospital Stay: Payer: Medicare Other

## 2023-09-22 ENCOUNTER — Encounter: Payer: Self-pay | Admitting: Hematology

## 2023-09-22 ENCOUNTER — Inpatient Hospital Stay (HOSPITAL_BASED_OUTPATIENT_CLINIC_OR_DEPARTMENT_OTHER): Payer: Medicare Other | Admitting: Hematology

## 2023-09-22 VITALS — BP 117/74 | HR 72 | Temp 97.4°F | Resp 17 | Wt 137.4 lb

## 2023-09-22 DIAGNOSIS — Z5111 Encounter for antineoplastic chemotherapy: Secondary | ICD-10-CM | POA: Diagnosis not present

## 2023-09-22 DIAGNOSIS — C250 Malignant neoplasm of head of pancreas: Secondary | ICD-10-CM | POA: Diagnosis not present

## 2023-09-22 DIAGNOSIS — C3492 Malignant neoplasm of unspecified part of left bronchus or lung: Secondary | ICD-10-CM | POA: Diagnosis not present

## 2023-09-22 DIAGNOSIS — Z95828 Presence of other vascular implants and grafts: Secondary | ICD-10-CM

## 2023-09-22 LAB — CMP (CANCER CENTER ONLY)
ALT: 35 U/L (ref 0–44)
AST: 72 U/L — ABNORMAL HIGH (ref 15–41)
Albumin: 3.5 g/dL (ref 3.5–5.0)
Alkaline Phosphatase: 69 U/L (ref 38–126)
Anion gap: 7 (ref 5–15)
BUN: 18 mg/dL (ref 8–23)
CO2: 25 mmol/L (ref 22–32)
Calcium: 9.3 mg/dL (ref 8.9–10.3)
Chloride: 99 mmol/L (ref 98–111)
Creatinine: 0.78 mg/dL (ref 0.44–1.00)
GFR, Estimated: >60 mL/min (ref >60)
Glucose, Bld: 114 mg/dL — ABNORMAL HIGH (ref 70–99)
Potassium: 4.4 mmol/L (ref 3.5–5.1)
Sodium: 131 mmol/L — ABNORMAL LOW (ref 135–145)
Total Bilirubin: 0.5 mg/dL (ref 0.0–1.2)
Total Protein: 6 g/dL — ABNORMAL LOW (ref 6.5–8.1)

## 2023-09-22 LAB — CBC WITH DIFFERENTIAL (CANCER CENTER ONLY)
Abs Immature Granulocytes: 0.03 10*3/uL (ref 0.00–0.07)
Basophils Absolute: 0.1 10*3/uL (ref 0.0–0.1)
Basophils Relative: 1 %
Eosinophils Absolute: 0 10*3/uL (ref 0.0–0.5)
Eosinophils Relative: 0 %
HCT: 27.1 % — ABNORMAL LOW (ref 36.0–46.0)
Hemoglobin: 9.1 g/dL — ABNORMAL LOW (ref 12.0–15.0)
Immature Granulocytes: 1 %
Lymphocytes Relative: 39 %
Lymphs Abs: 1.9 10*3/uL (ref 0.7–4.0)
MCH: 33.7 pg (ref 26.0–34.0)
MCHC: 33.6 g/dL (ref 30.0–36.0)
MCV: 100.4 fL — ABNORMAL HIGH (ref 80.0–100.0)
Monocytes Absolute: 0.4 10*3/uL (ref 0.1–1.0)
Monocytes Relative: 7 %
Neutro Abs: 2.5 10*3/uL (ref 1.7–7.7)
Neutrophils Relative %: 52 %
Platelet Count: 270 10*3/uL (ref 150–400)
RBC: 2.7 MIL/uL — ABNORMAL LOW (ref 3.87–5.11)
RDW: 17.7 % — ABNORMAL HIGH (ref 11.5–15.5)
Smear Review: NORMAL
WBC Count: 4.8 10*3/uL (ref 4.0–10.5)
nRBC: 0.4 % — ABNORMAL HIGH (ref 0.0–0.2)

## 2023-09-22 MED ORDER — GEMCITABINE HCL CHEMO INJECTION 1 GM/26.3ML
1000.0000 mg/m2 | Freq: Once | INTRAVENOUS | Status: AC
  Administered 2023-09-22: 1672 mg via INTRAVENOUS
  Filled 2023-09-22: qty 43.97

## 2023-09-22 MED ORDER — SODIUM CHLORIDE 0.9% FLUSH
10.0000 mL | Freq: Once | INTRAVENOUS | Status: AC
  Administered 2023-09-22: 10 mL

## 2023-09-22 MED ORDER — HEPARIN SOD (PORK) LOCK FLUSH 100 UNIT/ML IV SOLN
500.0000 [IU] | Freq: Once | INTRAVENOUS | Status: AC | PRN
Start: 2023-09-22 — End: 2023-09-22
  Administered 2023-09-22: 500 [IU]

## 2023-09-22 MED ORDER — SODIUM CHLORIDE 0.9 % IV SOLN: Freq: Once | INTRAVENOUS | Status: AC

## 2023-09-22 MED ORDER — PROCHLORPERAZINE MALEATE 10 MG PO TABS
10.0000 mg | ORAL_TABLET | Freq: Once | ORAL | Status: AC
  Administered 2023-09-22: 10 mg via ORAL
  Filled 2023-09-22: qty 1

## 2023-09-22 MED ORDER — SODIUM CHLORIDE 0.9% FLUSH
10.0000 mL | INTRAVENOUS | Status: DC | PRN
  Administered 2023-09-22: 10 mL

## 2023-09-22 NOTE — Progress Notes (Signed)
 [facility]   [phone] Fax:([phone]) [phone]   Clinic Follow up Note   Patient Care Team: Beam, Lamar POUR, MD as PCP - General (Family Medicine) Dann Candyce RAMAN, MD as PCP - Cardiology (Cardiology) Kerrin Elspeth BROCKS, MD as Consulting Physician (Cardiothoracic Surgery) Lanny Callander, MD as Consulting Physician (Oncology) Burton, Lacie K, NP as Nurse Practitioner (Nurse Practitioner) Shannon Agent, MD as Consulting Physician (Radiation Oncology)  Date of Service:  09/22/2023  CHIEF COMPLAINT: f/u of pancreatic cancer  CURRENT THERAPY:  Gemcitabine   Oncology History   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 -Patient declined Whipple surgery due to her advanced age and diagnosis of lung cancer at the same time. -Patient agreed with low intensity chemotherapy with single agent gemcitabine  and started on June 17, 2023, for a total of 3 to 4 months, followed by consolidation radiation if no cancer progression on next restaging CT. -restaging CT 09/08/2023 showed stable disease   Assessment and Plan    Pancreatic Cancer Follow-up for pancreatic cancer. Recent CT abdomen/pelvis shows the pancreatic mass is similar or slightly smaller with no new findings. Currently on cycle five, day eight of chemotherapy. Fiducial placement scheduled for February 20th, followed by high-dose radiation therapy. Plan to stop chemotherapy before fiducial placement.  She will proceed with SBRT after fiducial placement.  Discussed that no chemotherapy will be needed during radiation therapy. Prefers to manage pain with acetaminophen . - Stop chemotherapy before fiducial placement - Proceed with fiducial placement on February 20th - Coordinate  with radiation oncologist for post-fiducial placement radiation therapy - Schedule high-dose radiation therapy, likely 2-3 times a week for a couple of weeks - Repeat CT chest before radiation therapy  Lung adenocarcinoma stag I Lung lesion treated with radiation. Last CT PET scan was in July last year. Plan to repeat CT chest before radiation therapy for pancreatic cancer. - Repeat CT chest before radiation therapy for pancreatic cancer  Sciatica Pain starting in the lower back and radiating down the leg, consistent with sciatica. Pain managed with acetaminophen , 325 mg, up to four tablets a day. Prefers to avoid stronger medication. - Continue acetaminophen , 325 mg, up to four tablets a day - Consider referral to orthopedic surgeon if pain worsens or becomes unmanageable  Mild Anemia Mild anemia noted on recent blood test. No other significant issues. Taking vitamin D and prenatal vitamins. - Continue vitamin D and prenatal vitamins  Follow-up -Lab reviewed, adequate for treatment, she will proceed with cycle 5-day 8 gemcitabine  today, this is her last planned dose.  She will proceed with chemo break before radiation. - Schedule CT chest around February 10th - Follow-up appointment in five weeks, right before fiducial placement -Will inform her radiation oncologist Dr. Brigid to schedule her SBRT to pancreatic cancer  - Flush port at next visit.         SUMMARY OF ONCOLOGIC HISTORY: Oncology History  Adenocarcinoma of left lung, stage 1 (HCC)  07/06/2018 Initial Diagnosis   Adenocarcinoma of left lung, stage 1 (HCC)   02/15/2021 Cancer Staging   Staging form: Lung, AJCC 8th Edition - Clinical: Stage IA3 (cT1c, cN0, cM0) - Signed by Sherrod Sherrod, MD on 02/15/2021   Adenocarcinoma of head of pancreas (HCC)  03/03/2023 Imaging   CT chest IMPRESSION: 1. Enlarging and concerning  right lung nodules, the largest in the right middle lobe measuring up to 1.4 cm in diameter, highly  suspicious for primary bronchogenic carcinoma. The right middle lobe nodule is large enough to evaluate by PET-CT which is recommended. 2. No evidence of thoracic adenopathy or pleural effusion. No suspicious left lung nodule status post upper lobe resection. 3. Aortic Atherosclerosis (ICD10-I70.0) and Emphysema (ICD10-J43.9).   03/28/2023 PET scan   IMPRESSION: 1. 12 mm medial right middle lobe pulmonary nodule is hypermetabolic and consistent with primary neoplasm. 2. 7 mm right lower lobe nodule and 7 mm right upper lobe nodule do not demonstrate any hypermetabolism but will need close surveillance. 3. No mediastinal or hilar lymphadenopathy. 4. No findings for osseous metastatic disease. 5. Abnormal appearance of the pancreatic head and mild hypermetabolism worrisome for neoplasm. Recommend MRI abdomen without and with contrast for further evaluation. Aortic Atherosclerosis (ICD10-I70.0).   05/04/2023 Imaging   ABD MRI IMPRESSION: 1. Suspicious, solid lesion within the head of pancreas 3.0 x 2.4 x 2.4 cm. There is associated main duct dilatation within the head through tail of pancreas. Suspected lesion corresponds to the area of increased tracer uptake noted on the recent PET-CT. Findings are concerning for pancreatic adenocarcinoma. Motion artifact on the postcontrast images diminishes exam detail within the pancreas. Difficult to exclude involvement of the portal vein. The celiac trunk and proximal SMA appear uninvolved. Consider further evaluation with endoscopic ultrasound and possibly CTA of the abdomen. 2. Scattered T2 hyperintense and T1 hypointense foci within the liver are favored to represent multiple cysts. Unfortunately, motion artifact diminishes exam detail within the liver for the evaluation underlying enhancing liver lesions. Within this limitation, no obvious suspicious lesion identified at this time. 3. Right middle lobe lung nodule is again identified as seen  on the previous PET-CT.   05/04/2023 Imaging   Brain MRI IMPRESSION: No evidence of intracranial metastatic disease.   05/16/2023 Tumor Marker   CA 19-9: 256   05/22/2023 Cancer Staging   Staging form: Exocrine Pancreas, AJCC 8th Edition - Clinical stage from 05/22/2023: Stage IB (cT2, cN0, cM0) - Signed by Burton, Lacie K, NP on 06/09/2023 Stage prefix: Initial diagnosis Total positive nodes: 0   05/22/2023 Procedure   EUS by Dr. Wilhelmenia: 29 x 28 mm mass in the pancreatic head appearing suspicious for adeno, no malignant appearing lymph nodes were visualized.  Abuts the gastroduodenal artery but not involved with the SMA or celiac trunk.  T2 N0 by EUS    05/22/2023 Initial Biopsy    A. PANCREAS, HEAD LESION, FINE NEEDLE ASPIRATION:  FINAL MICROSCOPIC DIAGNOSIS:  - Adenocarcinoma    06/09/2023 Initial Diagnosis   Adenocarcinoma of head of pancreas (HCC)   06/17/2023 -  Chemotherapy   Patient is on Treatment Plan : LUNG Gemcitabine  (1000) D1,8 q21d      Genetic Testing   [facility] CancerNext-Expanded Panel+RNA was Negative. Report date is 06/24/2023.   The CancerNext-Expanded gene panel offered by [facility] and includes sequencing, rearrangement, and RNA analysis for the following 71 genes: AIP, ALK, APC, ATM, AXIN2, BAP1, BARD1, BMPR1A, BRCA1, BRCA2, BRIP1, CDC73, CDH1, CDK4, CDKN1B, CDKN2A, CHEK2, CTNNA1, DICER1, FH, FLCN, KIF1B, LZTR1, MAX, MEN1, MET, MLH1, MSH2, MSH3, MSH6, MUTYH, NF1, NF2, NTHL1, PALB2, PHOX2B, PMS2, POT1, PRKAR1A, PTCH1, PTEN, RAD51C, RAD51D, RB1, RET, SDHA, SDHAF2, SDHB, SDHC, SDHD, SMAD4, SMARCA4, SMARCB1, SMARCE1, STK11, SUFU, TMEM127, TP53, TSC1, TSC2, and VHL (sequencing and deletion/duplication); EGFR, EGLN1, HOXB13, KIT, MITF, PDGFRA, POLD1, and POLE (sequencing only); EPCAM and GREM1 (  deletion/duplication only).    09/08/2023 Imaging   CT abdomen and pelvis with contrast  IMPRESSION: 1. Given cross modality comparison to the MRI of 05/04/2023, similar to  minimal decrease in size of the pancreatic head mass. No vascular involvement or metastatic disease identified. 2. Apparent gastric body wall thickening could be due to underdistention. Correlate with symptoms of gastritis. 3. Incidental findings, including: Aortic atherosclerosis (ICD10-I70.0) and emphysema (ICD10-J43.9). Small hiatal hernia.      Discussed the use of AI scribe software for clinical note transcription with the patient, who gave verbal consent to proceed.  History of Present Illness   The patient, an 83 year old female with a history of pancreatic cancer, presents with discomfort in her leg that she believes is sciatica. The pain starts in her lower back and radiates down her leg. She has been managing the pain with Tylenol , taking four 325mg  tablets per day. She has experienced this type of pain before, several years ago, and believes it may have been triggered by a period of inactivity due to not feeling well. The pain is relieved somewhat by walking around. She has had both knees replaced in the past but does not wish to see her orthopedic surgeon for this issue at this time. She is also currently undergoing chemotherapy for her pancreatic cancer and is due to have fiducial placement for radiation therapy in the near future.         All other systems were reviewed with the patient and are negative.  MEDICAL HISTORY:  Past Medical History:  Diagnosis Date   Anxiety    Blood glucose elevated 05/27/2012   pt. 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   Overview:  Dr Rollin  Last Assessment & Plan:  Relevant Hx: Course: Daily Update: Today's Plan:    GERD  (gastroesophageal reflux disease)    Hemorrhoid 12/06/2015   History of hiatal hernia    History of radiation therapy    Right Lung- 06/30/23-07/14/23- Dr. Lynwood Nasuti   HLD (hyperlipidemia) 12/03/2011   Hypertension    Lung cancer (HCC) dx'd 04/2018   with recurrence 02/2023   Osteopenia 05/30/2015   Overview:  Bone density 05/2015 started fosamax    Pancreatic cancer (HCC) 02/2023   Primary localized osteoarthritis of left knee    Primary localized osteoarthritis of right knee 12/06/2015    SURGICAL HISTORY: Past Surgical History:  Procedure Laterality Date   ABDOMINAL HYSTERECTOMY     BIOPSY  05/22/2023   Procedure: BIOPSY;  Surgeon: Wilhelmenia Aloha Raddle., MD;  Location: [facility];  Service: Gastroenterology;;   CATARACT EXTRACTION, BILATERAL     COLONOSCOPY     ESOPHAGOGASTRODUODENOSCOPY (EGD) WITH PROPOFOL  N/A 05/22/2023   Procedure: ESOPHAGOGASTRODUODENOSCOPY (EGD) WITH PROPOFOL ;  Surgeon: Wilhelmenia Aloha Raddle., MD;  Location: [facility];  Service: Gastroenterology;  Laterality: N/A;   EUS N/A 05/22/2023   Procedure: UPPER ENDOSCOPIC ULTRASOUND (EUS) RADIAL;  Surgeon: Wilhelmenia Aloha Raddle., MD;  Location: [facility];  Service: Gastroenterology;  Laterality: N/A;   EYE SURGERY Bilateral    Cataract   FINE NEEDLE ASPIRATION  05/22/2023   Procedure: FINE NEEDLE ASPIRATION;  Surgeon: Wilhelmenia Aloha Raddle., MD;  Location: [facility];  Service: Gastroenterology;;   implantable loop recorder placement  10/26/2018   MDT LINQ Reveal XT ILR implanted for evaluation of palpitations and atrial fibrillation in office by Dr Kelsie, Device SN [id number])    IR IMAGING GUIDED PORT INSERTION  06/24/2023   KNEE SURGERY  05/01/2016   left uncompartmental    PARTIAL KNEE ARTHROPLASTY Right 12/18/2015   Procedure: UNICOMPARTMENTAL RIGHT KNEE;  Surgeon: Lamar Millman, MD;  Location: [facility] OR;  Service: Orthopedics;  Laterality: Right;   PARTIAL KNEE ARTHROPLASTY Left 05/01/2016    Procedure: UNICOMPARTMENTAL KNEE;  Surgeon: Lamar Millman, MD;  Location[facility];  Service: Orthopedics;  Laterality: Left;   POLYPECTOMY  05/22/2023   Procedure: POLYPECTOMY;  Surgeon: Mansouraty, Aloha Raddle., MD;  Location: [facility];  Service: Gastroenterology;;   TOTAL ABDOMINAL HYSTERECTOMY W/ BILATERAL SALPINGOOPHORECTOMY     VIDEO ASSISTED THORACOSCOPY (VATS)/ LOBECTOMY Left 05/13/2018   Procedure: VIDEO ASSISTED THORACOSCOPY (VATS)/LEFT UPPER LOBECTOMY;  Surgeon: Kerrin Elspeth BROCKS, MD;  Location: [facility] OR;  Service: Thoracic;  Laterality: Left;    I have reviewed the social history and family history with the patient and they are unchanged from previous note.  ALLERGIES:  is allergic to ambien  [zolpidem  tartrate], sulfa antibiotics, codeine, hydrocodone , and meperidine.  MEDICATIONS:  Current Outpatient Medications  Medication Sig Dispense Refill   albuterol  (VENTOLIN  HFA) 108 (90 Base) MCG/ACT inhaler Inhale 1-2 puffs into the lungs 3 (three) times daily as needed for wheezing or shortness of breath. 1 each 0   Calcium  Carb-Cholecalciferol (CALCIUM -VITAMIN D3) 600-200 MG-UNIT TABS Take by mouth daily.     cetirizine (ZYRTEC) 10 MG tablet Take 10 mg by mouth as needed for allergies.     diphenhydramine -acetaminophen  (TYLENOL  PM) 25-500 MG TABS tablet Take 2 tablets by mouth at bedtime as needed (for sleep).     escitalopram (LEXAPRO) 20 MG tablet Take by mouth daily.     fluticasone  (FLONASE ) 50 MCG/ACT nasal spray Place 1-2 sprays into both nostrils daily. 16 mL 2   lidocaine -prilocaine  (EMLA ) cream Apply to affected area once 30 g 3   metoprolol  succinate (TOPROL -XL) 50 MG 24 hr tablet Take 50 mg by mouth daily. Take with or immediately following a meal.     Multiple Vitamins-Minerals (MULTIVITAMIN PO) Take 1 tablet by mouth daily.     ondansetron  (ZOFRAN ) 8 MG tablet Take 1 tablet (8 mg total) by mouth every 8 (eight) hours as needed for nausea or vomiting. 30 tablet 1    pantoprazole (PROTONIX) 40 MG tablet Take 40 mg by mouth daily.     polyethylene glycol (MIRALAX  / GLYCOLAX ) packet Take 8.5 g by mouth every other day.     prochlorperazine  (COMPAZINE ) 10 MG tablet Take 1 tablet (10 mg total) by mouth every 6 (six) hours as needed for nausea or vomiting. 30 tablet 1   simvastatin  (ZOCOR ) 20 MG tablet Take 20 mg by mouth daily.     triamcinolone  ointment (KENALOG ) 0.5 % Apply 1 Application topically 2 (two) times daily. 30 g 0   No current facility-administered medications for this visit.   Facility-Administered Medications Ordered in Other Visits  Medication Dose Route Frequency Provider Last Rate Last Admin   sodium chloride  flush (NS) 0.9 % injection 10 mL  10 mL Intracatheter PRN Lanny Callander, MD   10 mL at 09/22/23 1619    PHYSICAL EXAMINATION: ECOG PERFORMANCE STATUS: 1 - Symptomatic but completely ambulatory  Vitals:   09/22/23 1402  BP: 117/74  Pulse: 72  Resp: 17  Temp: (!) 97.4 F (36.3 C)  SpO2: 99%   Wt Readings from Last 3 Encounters:  09/22/23 137 lb 6.4 oz (62.3 kg)  09/16/23 138 lb 14.4 oz (63 kg)  09/01/23 139 lb 12.8 oz (63.4 kg)     GENERAL:alert, no distress and comfortable SKIN: skin color, texture, turgor are normal, no rashes or significant lesions EYES: normal, Conjunctiva are pink and non-injected, sclera clear NECK: supple, thyroid normal size, non-tender, without nodularity LYMPH:  no palpable lymphadenopathy in the cervical, axillary  LUNGS: clear to auscultation and percussion with normal breathing effort HEART: regular rate & rhythm and no murmurs and no lower extremity edema ABDOMEN:abdomen soft, non-tender and normal bowel sounds Musculoskeletal:no cyanosis of digits and no clubbing  NEURO: alert & oriented x 3 with fluent speech, no focal motor/sensory deficits   LABORATORY DATA:  I have reviewed the data as listed    Latest Ref Rng & Units 09/22/2023    1:39 PM 09/16/2023    9:57 AM 09/01/2023   12:08 PM   CBC  WBC 4.0 - 10.5 K/uL 4.8  8.4  5.4   Hemoglobin 12.0 - 15.0 g/dL 9.1  9.2  8.7   Hematocrit 36.0 - 46.0 % 27.1  27.8  25.8   Platelets 150 - 400 K/uL 270  298  320         Latest Ref Rng & Units 09/22/2023    1:39 PM 09/16/2023    9:57 AM 09/01/2023   12:08 PM  CMP  Glucose 70 - 99 mg/dL 885  817  836   BUN 8 - 23 mg/dL 18  15  15    Creatinine 0.44 - 1.00 mg/dL 9.21  9.25  9.18   Sodium 135 - 145 mmol/L 131  133  129   Potassium 3.5 - 5.1 mmol/L 4.4  4.0  4.1   Chloride 98 - 111 mmol/L 99  100  97   CO2 22 - 32 mmol/L 25  27  24    Calcium  8.9 - 10.3 mg/dL 9.3  8.7  9.4   Total Protein 6.5 - 8.1 g/dL 6.0  6.0  6.0   Total Bilirubin 0.0 - 1.2 mg/dL 0.5  0.6  0.5   Alkaline Phos 38 - 126 U/L 69  75  69   AST 15 - 41 U/L 72  31  44   ALT 0 - 44 U/L 35  17  33       RADIOGRAPHIC STUDIES: I have personally reviewed the radiological images as listed and agreed with the findings in the report. No results found.    Orders Placed This Encounter  Procedures   CT CHEST WO CONTRAST    Standing Status:   Future    Expected Date:   10/20/2023    Expiration Date:   09/21/2024    Preferred imaging location?:   [facility]   All questions were answered. The patient knows to call the clinic with any problems, questions or concerns. No barriers to learning was detected. The total time spent in the appointment was 30 minutes.     Onita Mattock, MD 09/22/2023

## 2023-09-24 ENCOUNTER — Telehealth: Payer: Self-pay | Admitting: Hematology

## 2023-09-24 NOTE — Telephone Encounter (Signed)
 Patient is aware of scheduled appointment times/dates

## 2023-10-10 ENCOUNTER — Ambulatory Visit (HOSPITAL_COMMUNITY)
Admission: RE | Admit: 2023-10-10 | Discharge: 2023-10-10 | Disposition: A | Discharge status: Home / Self Care | Payer: Medicare Other | Source: Ambulatory Visit | Attending: Hematology | Admitting: Hematology

## 2023-10-10 DIAGNOSIS — C3492 Malignant neoplasm of unspecified part of left bronchus or lung: Secondary | ICD-10-CM | POA: Insufficient documentation

## 2023-10-23 ENCOUNTER — Encounter (HOSPITAL_COMMUNITY): Payer: Self-pay | Admitting: Gastroenterology

## 2023-10-23 NOTE — Progress Notes (Signed)
Attempted to obtain medical history for pre op call via telephone, unable to reach at this time. HIPAA compliant voicemail message left requesting return call to pre surgical testing department.

## 2023-10-28 ENCOUNTER — Inpatient Hospital Stay: Payer: Medicare Other | Attending: Internal Medicine

## 2023-10-28 ENCOUNTER — Telehealth: Payer: Self-pay | Admitting: Hematology

## 2023-10-28 ENCOUNTER — Encounter: Payer: Self-pay | Admitting: Hematology

## 2023-10-28 ENCOUNTER — Inpatient Hospital Stay: Payer: Medicare Other | Attending: Internal Medicine | Admitting: Hematology

## 2023-10-28 VITALS — BP 143/77 | HR 55 | Temp 97.6°F | Resp 17 | Wt 134.0 lb

## 2023-10-28 DIAGNOSIS — C3492 Malignant neoplasm of unspecified part of left bronchus or lung: Secondary | ICD-10-CM

## 2023-10-28 DIAGNOSIS — Z9221 Personal history of antineoplastic chemotherapy: Secondary | ICD-10-CM | POA: Insufficient documentation

## 2023-10-28 DIAGNOSIS — Z85118 Personal history of other malignant neoplasm of bronchus and lung: Secondary | ICD-10-CM | POA: Insufficient documentation

## 2023-10-28 DIAGNOSIS — Z95828 Presence of other vascular implants and grafts: Secondary | ICD-10-CM

## 2023-10-28 DIAGNOSIS — C250 Malignant neoplasm of head of pancreas: Secondary | ICD-10-CM

## 2023-10-28 DIAGNOSIS — Z923 Personal history of irradiation: Secondary | ICD-10-CM | POA: Diagnosis not present

## 2023-10-28 LAB — CMP (CANCER CENTER ONLY)
ALT: 7 U/L (ref 0–44)
AST: 15 U/L (ref 15–41)
Albumin: 3.7 g/dL (ref 3.5–5.0)
Alkaline Phosphatase: 51 U/L (ref 38–126)
Anion gap: 6 (ref 5–15)
BUN: 18 mg/dL (ref 8–23)
CO2: 27 mmol/L (ref 22–32)
Calcium: 9.2 mg/dL (ref 8.9–10.3)
Chloride: 101 mmol/L (ref 98–111)
Creatinine: 0.72 mg/dL (ref 0.44–1.00)
GFR, Estimated: >60 mL/min (ref >60)
Glucose, Bld: 139 mg/dL — ABNORMAL HIGH (ref 70–99)
Potassium: 3.6 mmol/L (ref 3.5–5.1)
Sodium: 134 mmol/L — ABNORMAL LOW (ref 135–145)
Total Bilirubin: 0.4 mg/dL (ref 0.0–1.2)
Total Protein: 5.7 g/dL — ABNORMAL LOW (ref 6.5–8.1)

## 2023-10-28 LAB — CBC WITH DIFFERENTIAL (CANCER CENTER ONLY)
Abs Immature Granulocytes: 0.03 10*3/uL (ref 0.00–0.07)
Basophils Absolute: 0 10*3/uL (ref 0.0–0.1)
Basophils Relative: 0 %
Eosinophils Absolute: 0.1 10*3/uL (ref 0.0–0.5)
Eosinophils Relative: 1 %
HCT: 36.5 % (ref 36.0–46.0)
Hemoglobin: 12 g/dL (ref 12.0–15.0)
Immature Granulocytes: 0 %
Lymphocytes Relative: 20 %
Lymphs Abs: 1.7 10*3/uL (ref 0.7–4.0)
MCH: 34.9 pg — ABNORMAL HIGH (ref 26.0–34.0)
MCHC: 32.9 g/dL (ref 30.0–36.0)
MCV: 106.1 fL — ABNORMAL HIGH (ref 80.0–100.0)
Monocytes Absolute: 0.7 10*3/uL (ref 0.1–1.0)
Monocytes Relative: 9 %
Neutro Abs: 5.9 10*3/uL (ref 1.7–7.7)
Neutrophils Relative %: 70 %
Platelet Count: 142 10*3/uL — ABNORMAL LOW (ref 150–400)
RBC: 3.44 MIL/uL — ABNORMAL LOW (ref 3.87–5.11)
RDW: 15.4 % (ref 11.5–15.5)
WBC Count: 8.4 10*3/uL (ref 4.0–10.5)
nRBC: 0 % (ref 0.0–0.2)

## 2023-10-28 MED ORDER — HEPARIN SOD (PORK) LOCK FLUSH 100 UNIT/ML IV SOLN
500.0000 [IU] | Freq: Once | INTRAVENOUS | Status: AC
  Administered 2023-10-28: 500 [IU]

## 2023-10-28 MED ORDER — HYDROCODONE-ACETAMINOPHEN 5-325 MG PO TABS: 0.5000 | ORAL_TABLET | Freq: Three times a day (TID) | ORAL | 0 refills | Status: DC | PRN

## 2023-10-28 MED ORDER — SODIUM CHLORIDE 0.9% FLUSH
10.0000 mL | Freq: Once | INTRAVENOUS | Status: AC
  Administered 2023-10-28: 10 mL

## 2023-10-28 NOTE — Telephone Encounter (Signed)
Patient is aware of rescheduled appointment times/dates 

## 2023-10-28 NOTE — Assessment & Plan Note (Signed)
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 -pt started SBRT on 06/30/2023 and completed on 07/14/2023

## 2023-10-28 NOTE — Progress Notes (Signed)
[facility]   [phone] [phone]   Clinic Follow up Note   Patient Care Team: Beam, Chales Salmon, MD as PCP - General (Family Medicine) Corky Crafts, MD as PCP - Cardiology (Cardiology) Loreli Slot, MD as Consulting Physician (Cardiothoracic Surgery) Malachy Mood, MD as Consulting Physician (Oncology) Pollyann Samples, NP as Nurse Practitioner (Nurse Practitioner) Antony Blackbird, MD as Consulting Physician (Radiation Oncology)  Date of Service:  10/28/2023  CHIEF COMPLAINT: f/u of pancreatic cancer  CURRENT THERAPY:  Pending SBRT  Oncology History   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 -Patient declined Whipple surgery due to her advanced age and diagnosis of lung cancer at the same time. -Patient agreed with low intensity chemotherapy with single agent gemcitabine and started on June 17, 2023, for a total of 3 to 4 months, followed by consolidation radiation if no cancer progression on next restaging CT. -restaging CT 09/08/2023 showed stable disease  Adenocarcinoma of left lung, stage 1 (HCC) -She had a history of stage Ia non-small cell lung cancer/adenocarcinoma of left lung, status post lobectomy in September 2019 -On surveillance CT scan in June 2024, she was found to have 7 mm right middle lobe nodule, increased to 12 mm and hypermetabolic on PET scan in September 2024, she also has 2 additional 7 mm lung nodules which were not hypermetabolic on PET scan. -pt started SBRT on 06/30/2023 and completed on 07/14/2023   Assessment and Plan    Pancreatic Cancer 83 year old female with pancreatic cancer, currently off chemotherapy. Improved energy levels and resolved anemia. Normal platelet  count and kidney/liver function. Discussed anticipated outcomes of radiation therapy, including potential temporary fatigue and gastrointestinal issues. Explained that tumor marker may be slightly elevated due to being off chemotherapy but is expected to decrease post-radiation. Emphasized importance of follow-up scans to monitor for changes. - Follow-up tumor marker results - Repeat CT scan in three months unless new symptoms develop - Continue radiation treatment, scheduled to finish on March 20th - Schedule follow-up appointment in 7-8 weeks - Flush port during next visit  Lung cancer -Status post SBRT radiation in August 2024 -Surveillance CT scan from October 10, 2023 showed postradiation change in her right lung, and slightly increased size of right upper lobe lung nodule.  No other new lesions. -Discussed with Dr. Thera Flake, we decided to monitor for now.  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 Pain rated 8/10, interferes with sleep. Scheduled for steroid injection between L4 and L5 next week. Discussed risks and benefits of hydrocodone, including potential constipation and drowsiness. Advised starting with half a tablet at night to manage pain and improve sleep. Informed about need for Miralax or [place] to manage constipation. Discussed use of Tylenol for additional pain relief. - Proceed with scheduled steroid injection - Prescribe hydrocodone, advise starting with half tablet at night - Monitor for constipation and use Miralax or [place] if needed - Continue Tylenol as needed, up to twice a day  General Health Maintenance Maintaining regular eating schedule despite reduced appetite. No other symptoms reported. - Encourage balanced diet and regular meals - Monitor for new symptoms and report if she occurs  Plan -She is scheduled for fiducial placement on October 30, 2023, and start radiation to pancreatic cancer on  November 17, 2023. - Follow-up  appointment in 7-8 weeks - Contact clinic if new symptoms or concerns arise before next appointment.         SUMMARY OF ONCOLOGIC HISTORY: Oncology History  Adenocarcinoma of left lung, stage 1 (HCC)  07/06/2018 Initial Diagnosis   Adenocarcinoma of left lung, stage 1 (HCC)   02/15/2021 Cancer Staging   Staging form: Lung, AJCC 8th Edition - Clinical: Stage IA3 (cT1c, cN0, cM0) - Signed by Si Gaul, MD on 02/15/2021   Adenocarcinoma of head of pancreas (HCC)  03/03/2023 Imaging   CT chest IMPRESSION: 1. Enlarging and concerning right lung nodules, the largest in the right middle lobe measuring up to 1.4 cm in diameter, highly suspicious for primary bronchogenic carcinoma. The right middle lobe nodule is large enough to evaluate by PET-CT which is recommended. 2. No evidence of thoracic adenopathy or pleural effusion. No suspicious left lung nodule status post upper lobe resection. 3. Aortic Atherosclerosis (ICD10-I70.0) and Emphysema (ICD10-J43.9).   03/28/2023 PET scan   IMPRESSION: 1. 12 mm medial right middle lobe pulmonary nodule is hypermetabolic and consistent with primary neoplasm. 2. 7 mm right lower lobe nodule and 7 mm right upper lobe nodule do not demonstrate any hypermetabolism but will need close surveillance. 3. No mediastinal or hilar lymphadenopathy. 4. No findings for osseous metastatic disease. 5. Abnormal appearance of the pancreatic head and mild hypermetabolism worrisome for neoplasm. Recommend MRI abdomen without and with contrast for further evaluation. Aortic Atherosclerosis (ICD10-I70.0).   05/04/2023 Imaging   ABD MRI IMPRESSION: 1. Suspicious, solid lesion within the head of pancreas 3.0 x 2.4 x 2.4 cm. There is associated main duct dilatation within the head through tail of pancreas. Suspected lesion corresponds to the area of increased tracer uptake noted on the recent PET-CT. Findings are concerning for pancreatic adenocarcinoma. Motion  artifact on the postcontrast images diminishes exam detail within the pancreas. Difficult to exclude involvement of the portal vein. The celiac trunk and proximal SMA appear uninvolved. Consider further evaluation with endoscopic ultrasound and possibly CTA of the abdomen. 2. Scattered T2 hyperintense and T1 hypointense foci within the liver are favored to represent multiple cysts. Unfortunately, motion artifact diminishes exam detail within the liver for the evaluation underlying enhancing liver lesions. Within this limitation, no obvious suspicious lesion identified at this time. 3. Right middle lobe lung nodule is again identified as seen on the previous PET-CT.   05/04/2023 Imaging   Brain MRI IMPRESSION: No evidence of intracranial metastatic disease.   05/16/2023 Tumor Marker   CA 19-9: 256   05/22/2023 Cancer Staging   Staging form: Exocrine Pancreas, AJCC 8th Edition - Clinical stage from 05/22/2023: Stage IB (cT2, cN0, cM0) - Signed by Pollyann Samples, NP on 06/09/2023 Stage prefix: Initial diagnosis Total positive nodes: 0   05/22/2023 Procedure   EUS by Dr. Meridee Score: 29 x 28 mm mass in the pancreatic head appearing suspicious for adeno, no malignant appearing lymph nodes were visualized.  Abuts the gastroduodenal artery but not involved with the SMA or celiac trunk.  T2 N0 by EUS    05/22/2023 Initial Biopsy    A. PANCREAS, HEAD LESION, FINE NEEDLE ASPIRATION:  FINAL MICROSCOPIC DIAGNOSIS:  - Adenocarcinoma    06/09/2023 Initial Diagnosis   Adenocarcinoma of head of pancreas (HCC)   06/17/2023 -  Chemotherapy   Patient is on Treatment Plan : LUNG Gemcitabine (1000) D1,8 q21d      Genetic Testing   [facility] CancerNext-Expanded Panel+RNA was Negative. Report  date is 06/24/2023.   The CancerNext-Expanded gene panel offered by [facility] and includes sequencing, rearrangement, and RNA analysis for the following 71 genes: AIP, ALK, APC, ATM, AXIN2, BAP1, BARD1, BMPR1A,  BRCA1, BRCA2, BRIP1, CDC73, CDH1, CDK4, CDKN1B, CDKN2A, CHEK2, CTNNA1, DICER1, FH, FLCN, KIF1B, LZTR1, MAX, MEN1, MET, MLH1, MSH2, MSH3, MSH6, MUTYH, NF1, NF2, NTHL1, PALB2, PHOX2B, PMS2, POT1, PRKAR1A, PTCH1, PTEN, RAD51C, RAD51D, RB1, RET, SDHA, SDHAF2, SDHB, SDHC, SDHD, SMAD4, SMARCA4, SMARCB1, SMARCE1, STK11, SUFU, TMEM127, TP53, TSC1, TSC2, and VHL (sequencing and deletion/duplication); EGFR, EGLN1, HOXB13, KIT, MITF, PDGFRA, POLD1, and POLE (sequencing only); EPCAM and GREM1 (deletion/duplication only).    09/08/2023 Imaging   CT abdomen and pelvis with contrast  IMPRESSION: 1. Given cross modality comparison to the MRI of 05/04/2023, similar to minimal decrease in size of the pancreatic head mass. No vascular involvement or metastatic disease identified. 2. Apparent gastric body wall thickening could be due to underdistention. Correlate with symptoms of gastritis. 3. Incidental findings, including: Aortic atherosclerosis (ICD10-I70.0) and emphysema (ICD10-J43.9). Small hiatal hernia.      Discussed the use of AI scribe software for clinical note transcription with the patient, who gave verbal consent to proceed.  History of Present Illness   A 83 year old patient with a history of pancreatic cancer presents for follow-up. The patient reports significant back pain, which she rates as an 8 on a scale of 10 in severity. The pain is severe enough to interfere with her ability to sleep, although she notes that she can find relief if she positions herself just right. She has been managing the pain with Tylenol, taking up to two tablets per day as needed. She also reports some disc bulging. The patient believes the pain is related to degeneration of the vertebrae rather than her pancreatic cancer.  In addition to her back pain, the patient is due for a procedure to place fiducial markers for radiation therapy. She has had radiation therapy before and is aware of the potential side effects, including  fatigue and potential stomach or bowel issues. The patient reports that she has been feeling better since stopping chemotherapy. Her energy levels have improved, and while she doesn't have a large appetite, she is making sure to eat three times a day. She denies any other symptoms.         All other systems were reviewed with the patient and are negative.  MEDICAL HISTORY:  Past Medical History:  Diagnosis Date   Anxiety    Blood glucose elevated 05/27/2012   pt. 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 Antony Blackbird   HLD (hyperlipidemia) 12/03/2011   Hypertension    Lung cancer (HCC) dx'd 04/2018   with recurrence 02/2023   Osteopenia 05/30/2015   Overview:  Bone density 05/2015 started fosamax    Pancreatic cancer (HCC) 02/2023   Primary localized osteoarthritis of  left knee    Primary localized osteoarthritis of right knee 12/06/2015    SURGICAL HISTORY: Past Surgical History:  Procedure Laterality Date   ABDOMINAL HYSTERECTOMY     BIOPSY  05/22/2023   Procedure: BIOPSY;  Surgeon: Lemar Lofty., MD;  Location: [facility];  Service: Gastroenterology;;   CATARACT EXTRACTION, BILATERAL     COLONOSCOPY     ESOPHAGOGASTRODUODENOSCOPY (EGD) WITH PROPOFOL N/A 05/22/2023   Procedure: ESOPHAGOGASTRODUODENOSCOPY (EGD) WITH PROPOFOL;  Surgeon: Lemar Lofty., MD;  Location: [facility];  Service:  Gastroenterology;  Laterality: N/A;   EUS N/A 05/22/2023   Procedure: UPPER ENDOSCOPIC ULTRASOUND (EUS) RADIAL;  Surgeon: Lemar Lofty., MD;  Location: [facility];  Service: Gastroenterology;  Laterality: N/A;   EYE SURGERY Bilateral    Cataract   FINE NEEDLE ASPIRATION  05/22/2023   Procedure: FINE NEEDLE ASPIRATION;  Surgeon: Meridee Score Netty Starring., MD;  Location: [facility];  Service: Gastroenterology;;   implantable loop recorder placement  10/26/2018   MDT LINQ Reveal XT ILR implanted for evaluation of palpitations and atrial fibrillation in office by Dr Johney Frame, Device SN [id number])    IR IMAGING GUIDED PORT INSERTION  06/24/2023   KNEE SURGERY  05/01/2016   left uncompartmental    PARTIAL KNEE ARTHROPLASTY Right 12/18/2015   Procedure: UNICOMPARTMENTAL RIGHT KNEE;  Surgeon: Salvatore Marvel, MD;  Location: [facility] OR;  Service: Orthopedics;  Laterality: Right;   PARTIAL KNEE ARTHROPLASTY Left 05/01/2016   Procedure: UNICOMPARTMENTAL KNEE;  Surgeon: Salvatore Marvel, MD;  Location: [facility] OR;  Service: Orthopedics;  Laterality: Left;   POLYPECTOMY  05/22/2023   Procedure: POLYPECTOMY;  Surgeon: Mansouraty, Netty Starring., MD;  Location: [facility];  Service: Gastroenterology;;   TOTAL ABDOMINAL HYSTERECTOMY W/ BILATERAL SALPINGOOPHORECTOMY     VIDEO ASSISTED THORACOSCOPY (VATS)/ LOBECTOMY Left 05/13/2018   Procedure: VIDEO ASSISTED THORACOSCOPY (VATS)/LEFT UPPER LOBECTOMY;  Surgeon: Loreli Slot, MD;  Location: [facility] OR;  Service: Thoracic;  Laterality: Left;    I have reviewed the social history and family history with the patient and they are unchanged from previous note.  ALLERGIES:  is allergic to [employer] tartrate], sulfa antibiotics, codeine, hydrocodone, and meperidine.  MEDICATIONS:  Current Outpatient Medications  Medication Sig Dispense Refill   HYDROcodone-acetaminophen (NORCO/VICODIN) 5-325 MG tablet Take 0.5-1 tablets by mouth every 8 (eight) hours as needed  for moderate pain (pain score 4-6). 20 tablet 0   albuterol (VENTOLIN HFA) 108 (90 Base) MCG/ACT inhaler Inhale 1-2 puffs into the lungs 3 (three) times daily as needed for wheezing or shortness of breath. 1 each 0   Calcium Carb-Cholecalciferol (CALCIUM-VITAMIN D3) 600-200 MG-UNIT TABS Take by mouth daily.     cetirizine (ZYRTEC) 10 MG tablet Take 10 mg by mouth as needed for allergies.     diphenhydramine-acetaminophen (TYLENOL PM) 25-500 MG TABS tablet Take 2 tablets by mouth at bedtime as needed (for sleep).     escitalopram (LEXAPRO) 20 MG tablet Take by mouth daily.     fluticasone (FLONASE) 50 MCG/ACT nasal spray Place 1-2 sprays into both nostrils daily. 16 mL 2   lidocaine-prilocaine (EMLA) cream Apply to affected area once 30 g 3   metoprolol succinate (TOPROL-XL) 50 MG 24 hr tablet Take 50 mg by mouth daily. Take with or immediately following a meal.     Multiple Vitamins-Minerals (MULTIVITAMIN PO) Take 1 tablet by mouth daily.     ondansetron (ZOFRAN) 8 MG tablet Take 1 tablet (8 mg total) by mouth every 8 (eight) hours as needed for  nausea or vomiting. 30 tablet 1   pantoprazole (PROTONIX) 40 MG tablet Take 40 mg by mouth daily.     polyethylene glycol (MIRALAX / GLYCOLAX) packet Take 8.5 g by mouth every other day.     prochlorperazine (COMPAZINE) 10 MG tablet Take 1 tablet (10 mg total) by mouth every 6 (six) hours as needed for nausea or vomiting. 30 tablet 1   simvastatin (ZOCOR) 20 MG tablet Take 20 mg by mouth daily.     triamcinolone ointment (KENALOG) 0.5 % Apply 1 Application topically 2 (two) times daily. 30 g 0   No current facility-administered medications for this visit.    PHYSICAL EXAMINATION: ECOG PERFORMANCE STATUS: 1 - Symptomatic but completely ambulatory  Vitals:   10/28/23 1524  BP: (!) 143/77  Pulse: (!) 55  Resp: 17  Temp: 97.6 F (36.4 C)  SpO2: 99%   Wt Readings from Last 3 Encounters:  10/28/23 134 lb (60.8 kg)  09/22/23 137 lb 6.4 oz (62.3 kg)   09/16/23 138 lb 14.4 oz (63 kg)     GENERAL:alert, no distress and comfortable SKIN: skin color, texture, turgor are normal, no rashes or significant lesions EYES: normal, Conjunctiva are pink and non-injected, sclera clear NECK: supple, thyroid normal size, non-tender, without nodularity LYMPH:  no palpable lymphadenopathy in the cervical, axillary  LUNGS: clear to auscultation and percussion with normal breathing effort HEART: regular rate & rhythm and no murmurs and no lower extremity edema ABDOMEN:abdomen soft, non-tender and normal bowel sounds Musculoskeletal:no cyanosis of digits and no clubbing  NEURO: alert & oriented x 3 with fluent speech, no focal motor/sensory deficits    LABORATORY DATA:  I have reviewed the data as listed    Latest Ref Rng & Units 10/28/2023    2:56 PM 09/22/2023    1:39 PM 09/16/2023    9:57 AM  CBC  WBC 4.0 - 10.5 K/uL 8.4  4.8  8.4   Hemoglobin 12.0 - 15.0 g/dL 19.1  9.1  9.2   Hematocrit 36.0 - 46.0 % 36.5  27.1  27.8   Platelets 150 - 400 K/uL 142  270  298         Latest Ref Rng & Units 10/28/2023    2:56 PM 09/22/2023    1:39 PM 09/16/2023    9:57 AM  CMP  Glucose 70 - 99 mg/dL 478  295  621   BUN 8 - 23 mg/dL 18  18  15    Creatinine 0.44 - 1.00 mg/dL 3.08  6.57  8.46   Sodium 135 - 145 mmol/L 134  131  133   Potassium 3.5 - 5.1 mmol/L 3.6  4.4  4.0   Chloride 98 - 111 mmol/L 101  99  100   CO2 22 - 32 mmol/L 27  25  27    Calcium 8.9 - 10.3 mg/dL 9.2  9.3  8.7   Total Protein 6.5 - 8.1 g/dL 5.7  6.0  6.0   Total Bilirubin 0.0 - 1.2 mg/dL 0.4  0.5  0.6   Alkaline Phos 38 - 126 U/L 51  69  75   AST 15 - 41 U/L 15  72  31   ALT 0 - 44 U/L 7  35  17       RADIOGRAPHIC STUDIES: I have personally reviewed the radiological images as listed and agreed with the findings in the report. No results found.    No orders of the defined types were placed in this encounter.  All questions were answered. The patient knows to call the clinic  with any problems, questions or concerns. No barriers to learning was detected. The total time spent in the appointment was 25 minutes.     Malachy Mood, MD 10/28/2023

## 2023-10-28 NOTE — Assessment & Plan Note (Signed)
cT3N0M0 -Incidental findings on the CT scan for lung cancer screening/follow-up -Diagnosed in September 2024 by EUS baseline CA 19.9 256 -Patient has synchronized 2 hypermetabolic lesion in her lungs, highly suspicious for malignancy. -Patient declined Whipple surgery due to her advanced age and diagnosis of lung cancer at the same time. -Patient agreed with low intensity chemotherapy with single agent gemcitabine and started on June 17, 2023, for a total of 3 to 4 months, followed by consolidation radiation if no cancer progression on next restaging CT. -restaging CT 09/08/2023 showed stable disease

## 2023-10-29 ENCOUNTER — Inpatient Hospital Stay: Payer: Medicare Other | Admitting: Hematology

## 2023-10-29 ENCOUNTER — Inpatient Hospital Stay: Payer: Medicare Other

## 2023-10-29 LAB — CANCER ANTIGEN 19-9: CA 19-9: 150 U/mL — ABNORMAL HIGH (ref 0–35)

## 2023-10-30 ENCOUNTER — Telehealth: Payer: Self-pay | Admitting: Gastroenterology

## 2023-10-30 ENCOUNTER — Ambulatory Visit (HOSPITAL_COMMUNITY): Admission: RE | Admit: 2023-10-30 | Payer: Medicare Other | Source: Home / Self Care | Admitting: Gastroenterology

## 2023-10-30 SURGERY — UPPER ENDOSCOPIC ULTRASOUND (EUS) RADIAL
Anesthesia: Monitor Anesthesia Care

## 2023-10-30 NOTE — Telephone Encounter (Signed)
I have found a cancellation for 11/06/23 at 8 am at [facility] with GM.   The pt has been advised and new instructions sent and given over the phone.

## 2023-10-30 NOTE — Telephone Encounter (Signed)
Appt has been moved to 01/05/24 at 1015 am at [facility] with GM  New instructions given to the pt and sent to My chart.

## 2023-10-30 NOTE — Telephone Encounter (Signed)
Inbound call from patient requesting a call to discuss rescheduling her EUS. Please advise.

## 2023-11-04 ENCOUNTER — Ambulatory Visit: Payer: Medicare Other

## 2023-11-04 ENCOUNTER — Ambulatory Visit: Payer: Medicare Other | Admitting: Radiation Oncology

## 2023-11-04 NOTE — Telephone Encounter (Signed)
 Inbound call from patient stating she is scheduled for a procedure on 2/27 and has questions regarding medications prior to procedure. Requesting a call back to discuss. Please advise.

## 2023-11-05 ENCOUNTER — Encounter (HOSPITAL_COMMUNITY): Payer: Self-pay | Admitting: Gastroenterology

## 2023-11-05 NOTE — Anesthesia Preprocedure Evaluation (Addendum)
 Anesthesia Evaluation  Patient identified by MRN, date of birth, ID band Patient awake    Reviewed: Allergy & Precautions, NPO status , Patient's Chart, lab work & pertinent test results, reviewed documented beta blocker date and time   Airway Mallampati: II  TM Distance: >3 FB Neck ROM: Full    Dental  (+) Lower Dentures, Upper Dentures   Pulmonary COPD,  COPD inhaler, former smoker Hx/o Lung Ca S/P VATS 05/13/2018 with recurrence 02/2023 S/P RT   Pulmonary exam normal breath sounds clear to auscultation       Cardiovascular hypertension, Pt. on medications and Pt. on home beta blockers Normal cardiovascular exam+ dysrhythmias Atrial Fibrillation  Rhythm:Regular Rate:Normal  Hx/o Atrial fibrillation controlled with Rx   Neuro/Psych   Anxiety     negative neurological ROS  negative psych ROS   GI/Hepatic Neg liver ROS, hiatal hernia,GERD  Medicated,,Pancreatic Ca Hx/o diverticular disease   Endo/Other  Hyperlipidemia  Renal/GU negative Renal ROS  negative genitourinary   Musculoskeletal  (+) Arthritis , Osteoarthritis,    Abdominal   Peds  Hematology Thrombocytopenia   Anesthesia Other Findings   Reproductive/Obstetrics                             Anesthesia Physical Anesthesia Plan  ASA: 3  Anesthesia Plan: MAC   Post-op Pain Management: Minimal or no pain anticipated   Induction: Intravenous  PONV Risk Score and Plan: 3 and Treatment may vary due to age or medical condition and Propofol infusion  Airway Management Planned: Simple Face Mask  Additional Equipment: None  Intra-op Plan:   Post-operative Plan:   Informed Consent: I have reviewed the patients History and Physical, chart, labs and discussed the procedure including the risks, benefits and alternatives for the proposed anesthesia with the patient or authorized representative who has indicated his/her understanding and  acceptance.     Dental advisory given  Plan Discussed with: CRNA and Anesthesiologist  Anesthesia Plan Comments:         Anesthesia Quick Evaluation

## 2023-11-05 NOTE — Telephone Encounter (Signed)
 The pt returned call- she was advised that she can take her meds as prescribed today.  She is also aware that the hospital will be calling to go over all her meds as well.  She is not taking blood thinner or weight loss medications.

## 2023-11-05 NOTE — Telephone Encounter (Signed)
 Left message on machine to call back

## 2023-11-06 ENCOUNTER — Ambulatory Visit (HOSPITAL_COMMUNITY): Payer: Self-pay | Admitting: Anesthesiology

## 2023-11-06 ENCOUNTER — Encounter (HOSPITAL_COMMUNITY)
Admission: RE | Disposition: A | Discharge status: Home / Self Care | Payer: Self-pay | Source: Home / Self Care | Attending: Gastroenterology

## 2023-11-06 ENCOUNTER — Encounter (HOSPITAL_COMMUNITY): Payer: Self-pay | Admitting: Gastroenterology

## 2023-11-06 ENCOUNTER — Other Ambulatory Visit: Payer: Self-pay

## 2023-11-06 ENCOUNTER — Ambulatory Visit (HOSPITAL_COMMUNITY)
Admission: RE | Admit: 2023-11-06 | Discharge: 2023-11-06 | Disposition: A | Discharge status: Home / Self Care | Payer: Medicare Other | Attending: Gastroenterology | Admitting: Gastroenterology

## 2023-11-06 DIAGNOSIS — K449 Diaphragmatic hernia without obstruction or gangrene: Secondary | ICD-10-CM | POA: Diagnosis not present

## 2023-11-06 DIAGNOSIS — K2289 Other specified disease of esophagus: Secondary | ICD-10-CM

## 2023-11-06 DIAGNOSIS — K219 Gastro-esophageal reflux disease without esophagitis: Secondary | ICD-10-CM | POA: Diagnosis not present

## 2023-11-06 DIAGNOSIS — K2970 Gastritis, unspecified, without bleeding: Secondary | ICD-10-CM | POA: Diagnosis not present

## 2023-11-06 DIAGNOSIS — Z85118 Personal history of other malignant neoplasm of bronchus and lung: Secondary | ICD-10-CM | POA: Diagnosis not present

## 2023-11-06 DIAGNOSIS — K8689 Other specified diseases of pancreas: Secondary | ICD-10-CM

## 2023-11-06 DIAGNOSIS — I10 Essential (primary) hypertension: Secondary | ICD-10-CM | POA: Insufficient documentation

## 2023-11-06 DIAGNOSIS — K3189 Other diseases of stomach and duodenum: Secondary | ICD-10-CM | POA: Diagnosis not present

## 2023-11-06 DIAGNOSIS — Z923 Personal history of irradiation: Secondary | ICD-10-CM | POA: Insufficient documentation

## 2023-11-06 DIAGNOSIS — I4891 Unspecified atrial fibrillation: Secondary | ICD-10-CM | POA: Diagnosis not present

## 2023-11-06 DIAGNOSIS — I899 Noninfective disorder of lymphatic vessels and lymph nodes, unspecified: Secondary | ICD-10-CM

## 2023-11-06 DIAGNOSIS — K5710 Diverticulosis of small intestine without perforation or abscess without bleeding: Secondary | ICD-10-CM | POA: Diagnosis not present

## 2023-11-06 DIAGNOSIS — C259 Malignant neoplasm of pancreas, unspecified: Secondary | ICD-10-CM | POA: Diagnosis not present

## 2023-11-06 DIAGNOSIS — Z87891 Personal history of nicotine dependence: Secondary | ICD-10-CM | POA: Diagnosis not present

## 2023-11-06 DIAGNOSIS — C250 Malignant neoplasm of head of pancreas: Secondary | ICD-10-CM

## 2023-11-06 HISTORY — PX: ESOPHAGOGASTRODUODENOSCOPY (EGD) WITH PROPOFOL: SHX5813

## 2023-11-06 HISTORY — PX: EUS: SHX5427

## 2023-11-06 HISTORY — PX: FIDUCIAL MARKER PLACEMENT: SHX6858

## 2023-11-06 SURGERY — UPPER ENDOSCOPIC ULTRASOUND (EUS) RADIAL
Anesthesia: Monitor Anesthesia Care

## 2023-11-06 MED ORDER — PROPOFOL 1000 MG/100ML IV EMUL
INTRAVENOUS | Status: AC
  Filled 2023-11-06: qty 300

## 2023-11-06 MED ORDER — SODIUM CHLORIDE 0.9 % IV SOLN: INTRAVENOUS | Status: DC | PRN

## 2023-11-06 MED ORDER — PROPOFOL 500 MG/50ML IV EMUL
INTRAVENOUS | Status: DC | PRN
  Administered 2023-11-06 (×2): 30 mg via INTRAVENOUS
  Administered 2023-11-06: 20 mg via INTRAVENOUS
  Administered 2023-11-06: 80 ug/kg/min via INTRAVENOUS

## 2023-11-06 MED ORDER — EPHEDRINE SULFATE-NACL 50-0.9 MG/10ML-% IV SOSY
PREFILLED_SYRINGE | INTRAVENOUS | Status: DC | PRN
  Administered 2023-11-06: 5 mg via INTRAVENOUS

## 2023-11-06 MED ORDER — GLYCOPYRROLATE 0.2 MG/ML IJ SOLN
INTRAMUSCULAR | Status: DC | PRN
  Administered 2023-11-06 (×2): .1 mg via INTRAVENOUS

## 2023-11-06 MED ORDER — PROPOFOL 1000 MG/100ML IV EMUL
INTRAVENOUS | Status: AC
Start: 2023-11-06 — End: ?
  Filled 2023-11-06: qty 100

## 2023-11-06 SURGICAL SUPPLY — 1 items: Beacon Fine Needle Fiducial IMPLANT

## 2023-11-06 NOTE — H&P (Signed)
 GASTROENTEROLOGY PROCEDURE H&P NOTE   Primary Care Physician: Beam, Chales Salmon, MD  HPI: [name] is a 83 y.o. female who presents for EGD/EUS for fiducial placement in setting of pancreatic cancer.  Past Medical History:  Diagnosis Date   Anxiety    Blood glucose elevated 05/27/2012   pt. 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 Antony Blackbird   HLD (hyperlipidemia) 12/03/2011   Hypertension    Lung cancer (HCC) dx'd 04/2018   with recurrence 02/2023   Osteopenia 05/30/2015   Overview:  Bone density 05/2015 started fosamax    Pancreatic cancer (HCC) 02/2023   Primary localized osteoarthritis of left knee    Primary localized osteoarthritis of right knee 12/06/2015   Past Surgical History:  Procedure Laterality Date   ABDOMINAL HYSTERECTOMY     BIOPSY  05/22/2023   Procedure: BIOPSY;  Surgeon: Lemar Lofty., MD;  Location: [facility];  Service: Gastroenterology;;   CATARACT EXTRACTION, BILATERAL     COLONOSCOPY     ESOPHAGOGASTRODUODENOSCOPY (EGD) WITH PROPOFOL N/A 05/22/2023   Procedure: ESOPHAGOGASTRODUODENOSCOPY (EGD) WITH PROPOFOL;  Surgeon: Lemar Lofty., MD;  Location: [facility];  Service: Gastroenterology;  Laterality: N/A;   EUS N/A 05/22/2023   Procedure: UPPER ENDOSCOPIC ULTRASOUND (EUS) RADIAL;  Surgeon: Lemar Lofty., MD;  Location: [facility];  Service:  Gastroenterology;  Laterality: N/A;   EYE SURGERY Bilateral    Cataract   FINE NEEDLE ASPIRATION  05/22/2023   Procedure: FINE NEEDLE ASPIRATION;  Surgeon: Meridee Score Netty Starring., MD;  Location: [facility];  Service: Gastroenterology;;   implantable loop recorder placement  10/26/2018   MDT LINQ Reveal XT ILR implanted for evaluation of palpitations and atrial fibrillation in office by Dr Johney Frame, Device SN [id number])    IR IMAGING GUIDED PORT INSERTION  06/24/2023   KNEE SURGERY  05/01/2016   left uncompartmental    PARTIAL KNEE ARTHROPLASTY Right 12/18/2015   Procedure: UNICOMPARTMENTAL RIGHT KNEE;  Surgeon: Salvatore Marvel, MD;  Location: [facility] OR;  Service: Orthopedics;  Laterality: Right;   PARTIAL KNEE ARTHROPLASTY Left 05/01/2016   Procedure: UNICOMPARTMENTAL KNEE;  Surgeon: Salvatore Marvel, MD;  Location: [facility] OR;  Service: Orthopedics;  Laterality: Left;   POLYPECTOMY  05/22/2023   Procedure: POLYPECTOMY;  Surgeon: Mansouraty, Netty Starring., MD;  Location: [facility];  Service: Gastroenterology;;   TOTAL ABDOMINAL HYSTERECTOMY W/ BILATERAL SALPINGOOPHORECTOMY     VIDEO ASSISTED THORACOSCOPY (VATS)/ LOBECTOMY Left 05/13/2018   Procedure: VIDEO ASSISTED THORACOSCOPY (VATS)/LEFT UPPER LOBECTOMY;  Surgeon: Loreli Slot, MD;  Location: [facility] OR;  Service: Thoracic;  Laterality: Left;   No current facility-administered medications for this encounter.   No current facility-administered medications for this encounter. Allergies  Allergen Reactions   Ambien [Zolpidem Tartrate] Other (See Comments)    ARRYTHMIA   Sulfa Antibiotics Other (See Comments)    UNSPECIFIED, childhood reaction  Codeine Hives and Rash   Hydrocodone Other (See Comments)    FATIGUE   Meperidine Nausea And Vomiting   Family History  Problem Relation Age of Onset   Congestive Heart Failure Mother    Cancer Father        prostate   Breast cancer Sister    Breast cancer Niece    Social History   Socioeconomic  History   Marital status: Widowed    Spouse name: Not on file   Number of children: Not on file   Years of education: Not on file   Highest education level: Not on file  Occupational History   Not on file  Tobacco Use   Smoking status: Former    Current packs/day: 0.00    Types: Cigarettes    Start date: 02/09/1969    Quit date: 02/10/2019    Years since quitting: 4.7   Smokeless tobacco: Never   Tobacco comments:    only one cigarette occasionally  Vaping Use   Vaping status: Never Used  Substance and Sexual Activity   Alcohol use: Yes    Alcohol/week: 1.0 standard drink of alcohol    Types: 1 Glasses of wine per week    Comment: social rare   Drug use: No   Sexual activity: Not Currently  Other Topics Concern   Not on file  Social History Narrative   Not on file   Social Drivers of Health   Financial Resource Strain: Low Risk  (06/02/2023)   Received from [facility]   Overall Financial Resource Strain (CARDIA)    Difficulty of Paying Living Expenses: Not very hard  Food Insecurity: No Food Insecurity (06/02/2023)   Received from [facility]   Hunger Vital Sign    Worried About Running Out of Food in the Last Year: Never true    Ran Out of Food in the Last Year: Never true  Transportation Needs: No Transportation Needs (06/02/2023)   Received from [facility] - Transportation    Lack of Transportation (Medical): No    Lack of Transportation (Non-Medical): No  Physical Activity: Insufficiently Active (06/02/2023)   Received from [facility]   Exercise Vital Sign    Days of Exercise per Week: 3 days    Minutes of Exercise per Session: 30 min  Stress: Stress Concern Present (06/02/2023)   Received from [facility] - Occupational Stress Questionnaire    Feeling of Stress : Rather much  Social Connections: Socially Integrated (06/02/2023)   Received from [facility]   Social Network    How would you rate  your social network (family, work, friends)?: Good participation with social networks  Intimate Partner Violence: Not At Risk (06/02/2023)   Received from [facility]   HITS    Over the last 12 months how often did your partner physically hurt you?: Never    Over the last 12 months how often did your partner insult you or talk down to you?: Never    Over the last 12 months how often did your partner threaten you with physical harm?: Never    Over the last 12 months how often did your partner scream or curse at you?: Never    Physical Exam: Today's Vitals   11/06/23 0731  BP: (!) 189/93  Pulse: 60  Resp: 14  Temp: (!) 97.2 F (36.2 C)  TempSrc: Tympanic  SpO2: 99%  Weight: 60.8 kg  Height: 5\' 2"  (  1.575 m)  PainSc: 5    Body mass index is 24.52 kg/m. GEN: NAD EYE: Sclerae anicteric ENT: MMM CV: Non-tachycardic GI: Soft, NT/ND NEURO:  Alert & Oriented x 3  Lab Results: No results for input(s): "WBC", "HGB", "HCT", "PLT" in the last 72 hours. BMET No results for input(s): "NA", "K", "CL", "CO2", "GLUCOSE", "BUN", "CREATININE", "CALCIUM" in the last 72 hours. LFT No results for input(s): "PROT", "ALBUMIN", "AST", "ALT", "ALKPHOS", "BILITOT", "BILIDIR", "IBILI" in the last 72 hours. PT/INR No results for input(s): "LABPROT", "INR" in the last 72 hours.   Impression / Plan: This is a 83 y.o.female who presents for EGD/EUS for fiducial placement in setting of pancreatic cancer.  The risks of an EUS including intestinal perforation, bleeding, infection, aspiration, and medication effects were discussed as was the possibility it may not give a definitive diagnosis if a biopsy is performed.  When a biopsy of the pancreas is done as part of the EUS, there is an additional risk of pancreatitis at the rate of about 1-2%.  It was explained that procedure related pancreatitis is typically mild, although it can be severe and even life threatening, which is why we do not perform random  pancreatic biopsies and only biopsy a lesion/area we feel is concerning enough to warrant the risk.  The risks and benefits of endoscopic evaluation/treatment were discussed with the patient and/or family; these include but are not limited to the risk of perforation, infection, bleeding, missed lesions, lack of diagnosis, severe illness requiring hospitalization, as well as anesthesia and sedation related illnesses.  The patient's history has been reviewed, patient examined, no change in status, and deemed stable for procedure.  The patient and/or family is agreeable to proceed.    Corliss Parish, MD [facility] Office # [phone]

## 2023-11-06 NOTE — Transfer of Care (Signed)
 Immediate Anesthesia Transfer of Care Note  Patient: [name]  Procedure(s) Performed: UPPER ENDOSCOPIC ULTRASOUND (EUS) RADIAL FIDUCIAL MARKER PLACEMENT ESOPHAGOGASTRODUODENOSCOPY (EGD) WITH PROPOFOL  Patient Location: PACU and Endoscopy Unit  Anesthesia Type:MAC  Level of Consciousness: drowsy  Airway & Oxygen Therapy: Patient Spontanous Breathing  Post-op Assessment: Report given to RN and Post -op Vital signs reviewed and stable  Post vital signs: Reviewed and stable  Last Vitals:  Vitals Value Taken Time  BP 125/51   Temp    Pulse 59 11/06/23 0845  Resp 17 11/06/23 0845  SpO2 100 % 11/06/23 0845  Vitals shown include unfiled device data.  Last Pain:  Vitals:   11/06/23 0731  TempSrc: Tympanic  PainSc: 5          Complications: No notable events documented.

## 2023-11-06 NOTE — Discharge Instructions (Signed)

## 2023-11-06 NOTE — Op Note (Signed)
 [facility] Patient Name: [name] Procedure Date: 11/06/2023 MRN: [id number] Attending MD: Corliss Parish , MD, [id number] Date of Birth: 1941-06-23 CSN: [id number] Age: 83 Admit Type: Outpatient Procedure:                Upper EUS Indications:              Pancreatic adenocarcinoma, Fiducial Providers:                Corliss Parish, MD, Lorenza Evangelist, RN, Harrington Challenger, Technician Referring MD:              Medicines:                Monitored Anesthesia Care Complications:            No immediate complications. Estimated Blood Loss:     Estimated blood loss was minimal. Procedure:                Pre-Anesthesia Assessment:                           - Prior to the procedure, a History and Physical                            was performed, and patient medications and                            allergies were reviewed. The patient's tolerance of                            previous anesthesia was also reviewed. The risks                            and benefits of the procedure and the sedation                            options and risks were discussed with the patient.                            All questions were answered, and informed consent                            was obtained. Prior Anticoagulants: The patient has                            taken no anticoagulant or antiplatelet agents. ASA                            Grade Assessment: III - A patient with severe                            systemic disease. After reviewing the risks and  benefits, the patient was deemed in satisfactory                            condition to undergo the procedure.                           After obtaining informed consent, the endoscope was                            passed under direct vision. Throughout the                            procedure, the patient's blood pressure, pulse, and                             oxygen saturations were monitored continuously. The                            [id number]-H190 ([id number]) Olympus endoscope was introduced                            through the mouth, and advanced to the second part                            of duodenum. The [id number]-[id number] ([id number]) Olympus                            duodenoscope was introduced through the mouth, and                            advanced to the area of papilla. The [id number]-[id number]                            ([id number]) Olympus linear ultrasound scope was                            introduced through the mouth, and advanced to the                            duodenum for ultrasound examination from the                            stomach and duodenum. The upper EUS was                            accomplished without difficulty. The patient                            tolerated the procedure. Scope In: Scope Out: Findings:      ENDOSCOPIC FINDING: :      No gross lesions were noted in the entire esophagus.      The Z-line was irregular and was found 36 cm from the incisors.      A 4 cm hiatal hernia was present.  Patchy mildly erythematous mucosa without bleeding was found in the       gastric antrum.      No other gross lesions were noted in the entire examined stomach.      No gross lesions were noted in the duodenal bulb, in the first portion       of the duodenum and in the second portion of the duodenum.      The major papilla was normal.      A medium non-bleeding diverticulum was found in the second/third portion       of the duodenum transition.      ENDOSONOGRAPHIC FINDING: :      An irregular mass was identified in the pancreatic head. The mass was       hypoechoic. The mass measured 27 mm by 25 mm in maximal cross-sectional       diameter. The endosonographic borders were poorly-defined. There was       sonographic evidence suggesting abutment of the malignancy with the       superior mesenteric vein without direct interface  loss. An intact       interface was seen between the mass and the superior mesenteric artery       and celiac trunk suggesting a lack of invasion. The remainder of the       pancreas was examined. The endosonographic appearance of parenchyma and       the upstream pancreatic duct indicated duct dilation upwards of 10.4 mm       within the neck, 8.5 mm within the body, 6.4 mm within the tail and       parenchymal atrophy. Fiducial marker placement was performed once the       target lesion in the head of the pancreas was identified a preloaded       marker in a 22 gauge needle was then deployed in and around the lesion.       This was repeated for a total of four markers.      No malignant-appearing lymph nodes were visualized in the celiac region       (level 20), peripancreatic region and porta hepatis region.      Endosonographic imaging in the visualized portion of the liver showed no       mass with small cysts also being noted.      The celiac region was visualized. Impression:               EGD impression:                           - No gross lesions in the entire esophagus. Z-line                            irregular, 36 cm from the incisors.                           - 4 cm hiatal hernia.                           - Erythematous mucosa in the antrum. No other gross                            lesions in the entire stomach.                           -  No gross lesions in the duodenal bulb, in the                            first portion of the duodenum and in the second                            portion of the duodenum.                           - Normal major papilla.                           - Non-bleeding duodenal diverticulum in the                            second/third transition.                           EUS impression:                           - A mass was identified in the pancreatic head. A                            tissue diagnosis was obtained prior to this exam.                             This is consistent with adenocarcinoma. Fiducial                            markers were deployed in the mass.                           - No malignant-appearing lymph nodes were                            visualized in the celiac region (level 20),                            peripancreatic region and porta hepatis region. Moderate Sedation:      Not Applicable - Patient had care per Anesthesia. Recommendation:           - The patient will be observed post-procedure,                            until all discharge criteria are met.                           - Discharge patient to home.                           - Patient has a contact number available for                            emergencies. The signs and symptoms of potential  delayed complications were discussed with the                            patient. Return to normal activities tomorrow.                            Written discharge instructions were provided to the                            patient.                           - Low fat diet.                           - Observe patient's clinical course.                           - Return to referring oncology and radiation                            oncology teams for SBRT initiation.                           - The findings and recommendations were discussed                            with the patient.                           - The findings and recommendations were discussed                            with the patient's family. Procedure Code(s):        --- Professional ---                           [phone], Esophagogastroduodenoscopy, flexible,                            transoral; with transendoscopic ultrasound-guided                            transmural injection of diagnostic or therapeutic                            substance(s) (eg, anesthetic, neurolytic agent) or                            fiducial marker(s)  (includes endoscopic ultrasound                            examination of the esophagus, stomach, and either                            the duodenum or a surgically altered stomach where  the jejunum is examined distal to the anastomosis) Diagnosis Code(s):        --- Professional ---                           K22.89, Other specified disease of esophagus                           K44.9, Diaphragmatic hernia without obstruction or                            gangrene                           K31.89, Other diseases of stomach and duodenum                           K86.89, Other specified diseases of pancreas                           I89.9, Noninfective disorder of lymphatic vessels                            and lymph nodes, unspecified                           C25.9, Malignant neoplasm of pancreas, unspecified                           K57.10, Diverticulosis of small intestine without                            perforation or abscess without bleeding CPT copyright 2022 American Medical Association. All rights reserved. The codes documented in this report are preliminary and upon coder review may  be revised to meet current compliance requirements. Corliss Parish, MD 11/06/2023 8:50:50 AM Number of Addenda: 0

## 2023-11-06 NOTE — Anesthesia Postprocedure Evaluation (Signed)
 Anesthesia Post Note  Patient: [name]  Procedure(s) Performed: UPPER ENDOSCOPIC ULTRASOUND (EUS) RADIAL FIDUCIAL MARKER PLACEMENT ESOPHAGOGASTRODUODENOSCOPY (EGD) WITH PROPOFOL     Patient location during evaluation: PACU Anesthesia Type: MAC Level of consciousness: awake and alert and oriented Pain management: pain level controlled Vital Signs Assessment: post-procedure vital signs reviewed and stable Respiratory status: spontaneous breathing, nonlabored ventilation and respiratory function stable Cardiovascular status: stable and blood pressure returned to baseline Postop Assessment: no apparent nausea or vomiting Anesthetic complications: no   No notable events documented.  Last Vitals:  Vitals:   11/06/23 0853 11/06/23 0903  BP: (!) 124/54 (!) 148/61  Pulse: 62 60  Resp: 20 14  Temp:    SpO2: 99% 98%    Last Pain:  Vitals:   11/06/23 0903  TempSrc:   PainSc: 0-No pain                 [name] A.

## 2023-11-09 ENCOUNTER — Encounter (HOSPITAL_COMMUNITY): Payer: Self-pay | Admitting: Gastroenterology

## 2023-11-10 ENCOUNTER — Ambulatory Visit
Admission: RE | Admit: 2023-11-10 | Discharge: 2023-11-10 | Disposition: A | Discharge status: Home / Self Care | Payer: Medicare Other | Source: Ambulatory Visit | Attending: Radiation Oncology | Admitting: Radiation Oncology

## 2023-11-10 VITALS — BP 158/84 | HR 61 | Temp 97.3°F | Resp 18 | Ht 62.0 in | Wt 128.2 lb

## 2023-11-10 DIAGNOSIS — C250 Malignant neoplasm of head of pancreas: Secondary | ICD-10-CM | POA: Insufficient documentation

## 2023-11-10 DIAGNOSIS — Z95828 Presence of other vascular implants and grafts: Secondary | ICD-10-CM | POA: Insufficient documentation

## 2023-11-10 MED ORDER — SODIUM CHLORIDE 0.9% FLUSH
10.0000 mL | Freq: Once | INTRAVENOUS | Status: AC
  Administered 2023-11-10: 10 mL

## 2023-11-10 MED ORDER — HEPARIN SOD (PORK) LOCK FLUSH 100 UNIT/ML IV SOLN
500.0000 [IU] | Freq: Once | INTRAVENOUS | Status: AC
  Administered 2023-11-10: 500 [IU] via INTRAVENOUS

## 2023-11-10 NOTE — Progress Notes (Incomplete)
 Has armband been applied?  Yes.    Does patient have an allergy to IV contrast dye?: No.   Has patient ever received premedication for IV contrast dye?: No.   Date of lab work: October 28, 2023 BUN: 18 CR: 0.72 eGFR: >60  Does patient take metformin?: No.   IV site: {iv locations:314275}  Has IV site been added to flowsheet?  {yes no:314532}  There were no vitals taken for this visit.

## 2023-11-10 NOTE — Progress Notes (Deleted)
 Has armband been applied?  Yes.    Does patient have an allergy to IV contrast dye?: No.   Has patient ever received premedication for IV contrast dye?: No.   Does patient take metformin?: No.  If patient does take metformin when was the last dose: N/A  Date of lab work: 10/28/2023 BUN: 18 CR: 0.72 eGfr: >60  IV site: Porta cath  Has IV site been added to flowsheet?  Yes.    BP (!) 158/84 (BP Location: Right Arm)   Pulse 61   Temp (!) 97.3 F (36.3 C) (Temporal)   Resp 18   Ht 5\' 2"  (1.575 m)   Wt 128 lb 4 oz (58.2 kg)   SpO2 100%   BMI 23.46 kg/m

## 2023-11-17 ENCOUNTER — Ambulatory Visit: Payer: Medicare Other | Admitting: Radiation Oncology

## 2023-11-18 ENCOUNTER — Ambulatory Visit: Payer: Medicare Other | Admitting: Radiation Oncology

## 2023-11-18 DIAGNOSIS — C250 Malignant neoplasm of head of pancreas: Secondary | ICD-10-CM | POA: Diagnosis not present

## 2023-11-19 ENCOUNTER — Ambulatory Visit
Admission: RE | Admit: 2023-11-19 | Discharge: 2023-11-19 | Disposition: A | Discharge status: Home / Self Care | Payer: Medicare Other | Source: Ambulatory Visit | Attending: Radiation Oncology | Admitting: Radiation Oncology

## 2023-11-19 ENCOUNTER — Other Ambulatory Visit: Payer: Self-pay

## 2023-11-19 DIAGNOSIS — C250 Malignant neoplasm of head of pancreas: Secondary | ICD-10-CM | POA: Diagnosis not present

## 2023-11-19 LAB — RAD ONC ARIA SESSION SUMMARY
Course Elapsed Days: 0
Plan Fractions Treated to Date: 1
Plan Prescribed Dose Per Fraction: 6.6 Gy
Plan Total Fractions Prescribed: 5
Plan Total Prescribed Dose: 33 Gy
Reference Point Dosage Given to Date: 6.6 Gy
Reference Point Session Dosage Given: 6.6 Gy
Session Number: 1

## 2023-11-20 ENCOUNTER — Ambulatory Visit: Payer: Medicare Other | Admitting: Radiation Oncology

## 2023-11-20 ENCOUNTER — Encounter: Payer: Self-pay | Admitting: Cardiology

## 2023-11-20 NOTE — Progress Notes (Signed)
 TO BE COMPLETED BY RADIATION ONCOLOGIST OFFICE:   Patient Name: ODELL FASCHING   Date of Birth: 03-05-1941   Radiation Oncologist: Dr. Antony Blackbird   Site to be Treated: Pancreas   Planned Treatment Start Date: 11/28/23   TO BE COMPLETED BY CARDIOLOGIST OFFICE:   Device Information:  Loop Recorder  Brand: Medtronic: [phone] Model #: Medtronic MVH84 Reveal LINQ  Serial Number: [id number]     Date of Placement: 10/26/2018  Site of Placement: Left Chest  Remote Device Check--Frequency: N/A (Device is no longer working)   Is the Patient Pacer Dependent?:  N/A  Does cardiologist request Radiation Oncology to schedule device testing by vendor for the following:  Prior to the Initiation of Treatments?  Yes []  No [x]  During Treatments?  Yes []  No [x]  Post Radiation Treatments?  Yes []  No [x]   Is device monitoring necessary by vendor/cardiologist team during treatments?  Yes []   No [x]   Is cardiac monitoring by Radiation Oncology nursing necessary during treatments? Yes []   No [x]   Do you recommend device be relocated prior to Radiation Treatment? Yes []   No [x]   **PLEASE LIST ANY NOTES OR SPECIAL REQUESTS:       CARDIOLOGIST SIGNATURE:  Dr. Loman Brooklyn Per Device Clinic Standing Orders, Lenor Coffin  11/20/2023 10:46 AM  **Please route completed form back to Radiation Oncology Nursing and "P [facility] RAD ONC ADMIN", OR send an update if there will be a delay in having form completed by expected start date.  **Call [phone] if you have any questions or do not get an in-basket response from a Radiation Oncology staff member

## 2023-11-21 ENCOUNTER — Other Ambulatory Visit: Payer: Self-pay

## 2023-11-21 ENCOUNTER — Ambulatory Visit
Admission: RE | Admit: 2023-11-21 | Discharge: 2023-11-21 | Disposition: A | Discharge status: Home / Self Care | Payer: Medicare Other | Source: Ambulatory Visit | Attending: Radiation Oncology | Admitting: Radiation Oncology

## 2023-11-21 DIAGNOSIS — C250 Malignant neoplasm of head of pancreas: Secondary | ICD-10-CM | POA: Diagnosis not present

## 2023-11-21 LAB — RAD ONC ARIA SESSION SUMMARY
Course Elapsed Days: 2
Plan Fractions Treated to Date: 2
Plan Prescribed Dose Per Fraction: 6.6 Gy
Plan Total Fractions Prescribed: 5
Plan Total Prescribed Dose: 33 Gy
Reference Point Dosage Given to Date: 13.2 Gy
Reference Point Session Dosage Given: 6.6 Gy
Session Number: 2

## 2023-11-24 ENCOUNTER — Ambulatory Visit
Admission: RE | Admit: 2023-11-24 | Discharge: 2023-11-24 | Disposition: A | Discharge status: Home / Self Care | Payer: Medicare Other | Source: Ambulatory Visit | Attending: Radiation Oncology | Admitting: Radiation Oncology

## 2023-11-24 ENCOUNTER — Other Ambulatory Visit: Payer: Self-pay

## 2023-11-24 DIAGNOSIS — C250 Malignant neoplasm of head of pancreas: Secondary | ICD-10-CM | POA: Diagnosis not present

## 2023-11-24 LAB — RAD ONC ARIA SESSION SUMMARY
Course Elapsed Days: 5
Plan Fractions Treated to Date: 3
Plan Prescribed Dose Per Fraction: 6.6 Gy
Plan Total Fractions Prescribed: 5
Plan Total Prescribed Dose: 33 Gy
Reference Point Dosage Given to Date: 19.8 Gy
Reference Point Session Dosage Given: 6.6 Gy
Session Number: 3

## 2023-11-25 ENCOUNTER — Ambulatory Visit: Payer: Medicare Other | Admitting: Radiation Oncology

## 2023-11-26 ENCOUNTER — Ambulatory Visit
Admission: RE | Admit: 2023-11-26 | Discharge: 2023-11-26 | Disposition: A | Discharge status: Home / Self Care | Payer: Medicare Other | Source: Ambulatory Visit | Attending: Radiation Oncology | Admitting: Radiation Oncology

## 2023-11-26 ENCOUNTER — Other Ambulatory Visit: Payer: Self-pay

## 2023-11-26 DIAGNOSIS — C250 Malignant neoplasm of head of pancreas: Secondary | ICD-10-CM

## 2023-11-26 LAB — RAD ONC ARIA SESSION SUMMARY
Course Elapsed Days: 7
Plan Fractions Treated to Date: 4
Plan Prescribed Dose Per Fraction: 6.6 Gy
Plan Total Fractions Prescribed: 5
Plan Total Prescribed Dose: 33 Gy
Reference Point Dosage Given to Date: 26.4 Gy
Reference Point Session Dosage Given: 6.6 Gy
Session Number: 4

## 2023-11-27 ENCOUNTER — Ambulatory Visit: Payer: Medicare Other | Admitting: Radiation Oncology

## 2023-11-28 ENCOUNTER — Other Ambulatory Visit: Payer: Self-pay

## 2023-11-28 ENCOUNTER — Ambulatory Visit
Admission: RE | Admit: 2023-11-28 | Discharge: 2023-11-28 | Disposition: A | Discharge status: Home / Self Care | Payer: Medicare Other | Source: Ambulatory Visit | Attending: Radiation Oncology | Admitting: Radiation Oncology

## 2023-11-28 ENCOUNTER — Ambulatory Visit
Admission: RE | Admit: 2023-11-28 | Discharge: 2023-11-28 | Disposition: A | Discharge status: Home / Self Care | Source: Ambulatory Visit | Attending: Radiation Oncology | Admitting: Radiation Oncology

## 2023-11-28 DIAGNOSIS — C250 Malignant neoplasm of head of pancreas: Secondary | ICD-10-CM | POA: Diagnosis not present

## 2023-11-28 LAB — RAD ONC ARIA SESSION SUMMARY
Course Elapsed Days: 9
Plan Fractions Treated to Date: 5
Plan Prescribed Dose Per Fraction: 6.6 Gy
Plan Total Fractions Prescribed: 5
Plan Total Prescribed Dose: 33 Gy
Reference Point Dosage Given to Date: 33 Gy
Reference Point Session Dosage Given: 6.6 Gy
Session Number: 5

## 2023-11-30 ENCOUNTER — Other Ambulatory Visit: Payer: Self-pay | Admitting: Nurse Practitioner

## 2023-11-30 DIAGNOSIS — C3492 Malignant neoplasm of unspecified part of left bronchus or lung: Secondary | ICD-10-CM

## 2023-12-01 NOTE — Radiation Completion Notes (Addendum)
  Radiation Oncology         [phone] ________________________________  Name: [name] MRN: [id number]  Date of Service: 11/28/2023  DOB: 1940-12-08  End of Treatment Note  Diagnosis: Stage IIA (cT3N0M0) pancreatic cancer Intent: Curative     ==========DELIVERED PLANS==========  First Treatment Date: 2023-11-19 Last Treatment Date: 2023-11-28   Plan Name: Pancreas_SBRT Site: Pancreas Technique: SBRT/SRT-IMRT Mode: Photon Dose Per Fraction: 6.6 Gy Prescribed Dose (Delivered / Prescribed): 33 Gy / 33 Gy Prescribed Fxs (Delivered / Prescribed): 5 / 5     ====================================   The patient tolerated radiation well and didn't develop any significant side effects.   The patient will return in one month and will continue follow up with Dr. Mosetta Putt as well.      Joyice Faster, PA-C

## 2023-12-02 ENCOUNTER — Encounter: Payer: Self-pay | Admitting: Hematology

## 2023-12-16 ENCOUNTER — Encounter: Payer: Self-pay | Admitting: Radiation Oncology

## 2023-12-29 ENCOUNTER — Ambulatory Visit
Admission: RE | Admit: 2023-12-29 | Discharge: 2023-12-29 | Disposition: A | Discharge status: Home / Self Care | Source: Ambulatory Visit | Attending: Radiation Oncology | Admitting: Radiation Oncology

## 2023-12-29 ENCOUNTER — Encounter: Payer: Self-pay | Admitting: Radiation Oncology

## 2023-12-29 VITALS — BP 141/90 | HR 72 | Temp 97.1°F | Resp 18 | Ht 62.5 in | Wt 129.1 lb

## 2023-12-29 DIAGNOSIS — C250 Malignant neoplasm of head of pancreas: Secondary | ICD-10-CM | POA: Diagnosis present

## 2023-12-29 DIAGNOSIS — Z85118 Personal history of other malignant neoplasm of bronchus and lung: Secondary | ICD-10-CM | POA: Insufficient documentation

## 2023-12-29 DIAGNOSIS — Z79899 Other long term (current) drug therapy: Secondary | ICD-10-CM | POA: Diagnosis not present

## 2023-12-29 DIAGNOSIS — Z9221 Personal history of antineoplastic chemotherapy: Secondary | ICD-10-CM | POA: Insufficient documentation

## 2023-12-29 DIAGNOSIS — R918 Other nonspecific abnormal finding of lung field: Secondary | ICD-10-CM | POA: Insufficient documentation

## 2023-12-29 DIAGNOSIS — Z923 Personal history of irradiation: Secondary | ICD-10-CM | POA: Insufficient documentation

## 2023-12-29 NOTE — Progress Notes (Signed)
 Radiation Oncology         [phone] ________________________________  Name: [name] MRN: [id number]  Date: 12/29/2023  DOB: June 10, 1941  Follow-Up Visit Note  CC: Beam, Conway Dennis, MD  Beam, Conway Dennis, MD    ICD-10-CM   1. Adenocarcinoma of head of pancreas (HCC)  C25.0        Diagnosis:  Stage IIA (cT3N0M0) pancreatic cancer, adenocarcinoma   Medial right middle lobe nodule concerning for non-small cell lung cancer recurrence. Recent PET scan also showed nodules in the RLL and RUL without hypermetabolism   History of stage IA (T1c, N0, M0) non-small cell lung cancer, adenocarcinoma with lymphovascular invasion diagnosed in 2019, s/p left upper lobectomy      Interval Since Last Radiation: 1 month    2) Radiation Treatment Dates: First Treatment Date: 2023-11-19 -- Last Treatment Date: 2023-11-28 Intent: Curative  Site/Dose/Technique/Mode:  Plan Name: Pancreas_SBRT Site: Pancreas Technique: SBRT/SRT-IMRT Mode: Photon Dose Per Fraction: 6.6 Gy Prescribed Dose (Delivered / Prescribed): 33 Gy / 33 Gy Prescribed Fxs (Delivered / Prescribed): 5 / 5  1) Indication for treatment: Curative       Radiation treatment dates: First Treatment Date: 2023-06-30 - Last Treatment Date: 2023-07-14 Site/Dose/Technique/Mode:  Site: Right lower lung  Technique: SBRT/SRT-IMRT Mode: Photon Dose Per Fraction: 18 Gy Prescribed Dose (Delivered / Prescribed): 54 Gy / 54 Gy Prescribed Fxs (Delivered / Prescribed): 3 / 3   Site: Right upper lung  Technique: SBRT/SRT-IMRT Mode: Photon Dose Per Fraction: 18 Gy Prescribed Dose (Delivered / Prescribed): 54 Gy / 54 Gy Prescribed Fxs (Delivered / Prescribed): 3 / 3   Site: Right middle lung Technique: IMRT Mode: Photon Dose Per Fraction: 5 Gy Prescribed Dose (Delivered / Prescribed): 50 Gy / 50 Gy Prescribed Fxs (Delivered / Prescribed): 10 / 10    Narrative:  The patient returns today for routine follow-up. She tolerated  radiation therapy quite well overall without any significant side effects.         To review, the patient completed her 5th and final cycle of chemotherapy (gemcitabine ) on 09/16/23 under Dr. Maryalice Smaller. She did have a CT AP with contrast performed approximately 1 week before her final cycle on 09/08/23 which demonstrated a similar to minimal decrease in size of the pancreatic head mass, and no vascular involvement of evidence of metastatic disease.   In the setting of her history of lung cancer, she also had a chest CT without contrast performed on 10/10/23 which demonstrated a slight increase in size of the apical segment RUL nodule, concerning for disease progression. In contrast to this finding, the perihilar RML nodule showed a slight decrease in size. Other notable findings included presumed radiation changes in the RUL and RML, and small right and trace left pleural effusions.                  After completing chemotherapy, the patient underwent an EUS with fiducial marker placement (in anticipation of SBRT) on 11/06/23. Sonographic findings demonstrated the irregular mass in the pancreatic head measuring 2.7 cm x 2.5 cm, evidence suggesting abutment of the malignancy with the superior mesenteric vein without direct interface loss, and intact interface between the mass and the superior mesenteric artery and celiac trunk suggesting a lack of invasion. Imaging findings otherwise showed no malignant-appearing lymph nodes in the celiac region, peripancreatic region, or porta hepatis region, and no evidence of hepatic metastatic disease.         She reports to be doing  well overall.  She notes some residual fatigue since completing the radiation, but feels this continues to prove.  She denies any nausea, vomiting, diarrhea, constipation, or any other changes to her bowel habits.  She does denies any abdominal pain.  Allergies:  is allergic to ambien  [zolpidem  tartrate], sulfa antibiotics, codeine, hydrocodone ,  and meperidine.  Meds: Current Outpatient Medications  Medication Sig Dispense Refill   albuterol  (VENTOLIN  HFA) 108 (90 Base) MCG/ACT inhaler Inhale 1-2 puffs into the lungs 3 (three) times daily as needed for wheezing or shortness of breath. 1 each 0   Calcium  Carb-Cholecalciferol (CALCIUM -VITAMIN D3) 600-200 MG-UNIT TABS Take by mouth daily.     cetirizine (ZYRTEC) 10 MG tablet Take 10 mg by mouth as needed for allergies.     diphenhydramine -acetaminophen  (TYLENOL  PM) 25-500 MG TABS tablet Take 2 tablets by mouth at bedtime as needed (for sleep).     escitalopram (LEXAPRO) 20 MG tablet Take by mouth daily.     fluticasone  (FLONASE ) 50 MCG/ACT nasal spray PLACE 1-2 SPRAYS INTO BOTH NOSTRILS DAILY. 48 mL 1   lidocaine -prilocaine  (EMLA ) cream Apply to affected area once 30 g 3   metoprolol  succinate (TOPROL -XL) 50 MG 24 hr tablet Take 50 mg by mouth daily. Take with or immediately following a meal.     Multiple Vitamins-Minerals (MULTIVITAMIN PO) Take 1 tablet by mouth daily.     pantoprazole (PROTONIX) 40 MG tablet Take 40 mg by mouth daily.     polyethylene glycol (MIRALAX  / GLYCOLAX ) packet Take 8.5 g by mouth every other day.     simvastatin  (ZOCOR ) 20 MG tablet Take 20 mg by mouth daily.     HYDROcodone -acetaminophen  (NORCO/VICODIN) 5-325 MG tablet Take 0.5-1 tablets by mouth every 8 (eight) hours as needed for moderate pain (pain score 4-6). 20 tablet 0   ondansetron  (ZOFRAN ) 8 MG tablet Take 1 tablet (8 mg total) by mouth every 8 (eight) hours as needed for nausea or vomiting. (Patient not taking: Reported on 12/29/2023) 30 tablet 1   prochlorperazine  (COMPAZINE ) 10 MG tablet Take 1 tablet (10 mg total) by mouth every 6 (six) hours as needed for nausea or vomiting. (Patient not taking: Reported on 12/29/2023) 30 tablet 1   triamcinolone  ointment (KENALOG ) 0.5 % Apply 1 Application topically 2 (two) times daily. (Patient not taking: Reported on 12/29/2023) 30 g 0   No current  facility-administered medications for this encounter.    Physical Findings: The patient is in no acute distress. Patient is alert and oriented.  height is 5' 2.5" (1.588 m) and weight is 129 lb 2 oz (58.6 kg). Her temporal temperature is 97.1 F (36.2 C) (abnormal). Her blood pressure is 141/90 (abnormal) and her pulse is 72. Her respiration is 18 and oxygen saturation is 100%. .  No significant changes. Lungs are clear to auscultation bilaterally. Heart has regular rate and rhythm. No palpable cervical, supraclavicular, or axillary adenopathy. Abdomen soft, non-tender, normal bowel sounds.   Lab Findings: Lab Results  Component Value Date   WBC 8.4 10/28/2023   HGB 12.0 10/28/2023   HCT 36.5 10/28/2023   MCV 106.1 (H) 10/28/2023   PLT 142 (L) 10/28/2023    Radiographic Findings: No results found.  Impression/Plan: Stage IIA (cT3N0M0) pancreatic cancer, adenocarcinoma; s/p radiation completed on 11/28/2023  Medial right middle lobe nodule concerning for non-small cell lung cancer recurrence. Recent PET scan also showed nodules in the RLL and RUL without hypermetabolism; s/p SBRT to the right lung completed on 07/14/2023  History of stage IA (T1c, N0, M0) non-small cell lung cancer, adenocarcinoma with lymphovascular invasion diagnosed in 2019, s/p left upper lobectomy       The patient is doing well overall and continues to recover from the effects of her radiation treatment.  She will continue follow-up care with Dr. Maryalice Smaller for management of her pancreatic cancer. She is scheduled to see her next on 12/31/2023.  She will continue under the care of Dr. Liam Redhead for management of her lung cancer. She is scheduled to see him next on 03/02/2024.  Radiation follow-up as needed.  We appreciate the opportunity to partake in this patient's care.  She was encouraged to call with any questions or concerns.   20 minutes of total time was spent for this patient encounter, including preparation,  face-to-face counseling with the patient and coordination of care, physical exam, and documentation of the encounter. ____________________________________   Julio Ohm, PA-C  This document serves as a record of services personally performed by Julio Ohm, PA-C. It was created on his behalf by Aleta Anda, a trained medical scribe. The creation of this record is based on the scribe's personal observations and the provider's statements to them. This document has been checked and approved by the attending provider.

## 2023-12-29 NOTE — Progress Notes (Signed)
 [name] is here today for follow up post radiation to the abdomen.  They completed their radiation on: 11/28/23  Does the patient complain of any of the following:  Pain: Denies pain in abdomen, but complains of sciatic pain in leg and to lower back. Abdominal bloating: Denies Diarrhea/Constipation: Constipation Nausea/Vomiting:Denies Urinary Issues (dysuria/incomplete emptying/ incontinence/ increased frequency/urgency): Denies Post radiation skin changes: Denies   BP (!) 141/90 (BP Location: Left Arm, Patient Position: Sitting)   Pulse 72   Temp (!) 97.1 F (36.2 C) (Temporal)   Resp 18   Ht 5' 2.5" (1.588 m)   Wt 129 lb 2 oz (58.6 kg)   SpO2 100%   BMI 23.24 kg/m \

## 2023-12-30 ENCOUNTER — Other Ambulatory Visit: Payer: Self-pay

## 2023-12-30 DIAGNOSIS — C250 Malignant neoplasm of head of pancreas: Secondary | ICD-10-CM

## 2023-12-30 NOTE — Assessment & Plan Note (Signed)
 cT3N0M0 -Incidental findings on the CT scan for lung cancer screening/follow-up -Diagnosed in September 2024 by EUS baseline CA 19.9 256 -Patient has synchronized 2 hypermetabolic lesion in her lungs, highly suspicious for malignancy. -Patient declined Whipple surgery due to her advanced age and diagnosis of lung cancer at the same time. -Patient agreed with low intensity chemotherapy with single agent gemcitabine and started on June 17, 2023, for a total of 3 to 4 months, followed by consolidation radiation if no cancer progression on next restaging CT. -restaging CT 09/08/2023 showed stable disease

## 2023-12-31 ENCOUNTER — Inpatient Hospital Stay: Payer: Medicare Other | Attending: Internal Medicine

## 2023-12-31 ENCOUNTER — Encounter: Payer: Self-pay | Admitting: Hematology

## 2023-12-31 ENCOUNTER — Inpatient Hospital Stay: Payer: Medicare Other | Attending: Internal Medicine | Admitting: Hematology

## 2023-12-31 VITALS — BP 140/76 | HR 65 | Temp 98.3°F | Resp 22 | Ht 62.5 in | Wt 131.8 lb

## 2023-12-31 DIAGNOSIS — Z95828 Presence of other vascular implants and grafts: Secondary | ICD-10-CM

## 2023-12-31 DIAGNOSIS — Z923 Personal history of irradiation: Secondary | ICD-10-CM | POA: Insufficient documentation

## 2023-12-31 DIAGNOSIS — C3492 Malignant neoplasm of unspecified part of left bronchus or lung: Secondary | ICD-10-CM

## 2023-12-31 DIAGNOSIS — C250 Malignant neoplasm of head of pancreas: Secondary | ICD-10-CM | POA: Insufficient documentation

## 2023-12-31 DIAGNOSIS — Z85118 Personal history of other malignant neoplasm of bronchus and lung: Secondary | ICD-10-CM | POA: Insufficient documentation

## 2023-12-31 DIAGNOSIS — D6481 Anemia due to antineoplastic chemotherapy: Secondary | ICD-10-CM | POA: Diagnosis not present

## 2023-12-31 DIAGNOSIS — Z90722 Acquired absence of ovaries, bilateral: Secondary | ICD-10-CM | POA: Diagnosis not present

## 2023-12-31 DIAGNOSIS — T451X5A Adverse effect of antineoplastic and immunosuppressive drugs, initial encounter: Secondary | ICD-10-CM | POA: Diagnosis not present

## 2023-12-31 LAB — CBC WITH DIFFERENTIAL (CANCER CENTER ONLY)
Abs Immature Granulocytes: 0.02 10*3/uL (ref 0.00–0.07)
Basophils Absolute: 0 10*3/uL (ref 0.0–0.1)
Basophils Relative: 0 %
Eosinophils Absolute: 0 10*3/uL (ref 0.0–0.5)
Eosinophils Relative: 1 %
HCT: 36.4 % (ref 36.0–46.0)
Hemoglobin: 12.5 g/dL (ref 12.0–15.0)
Immature Granulocytes: 0 %
Lymphocytes Relative: 13 %
Lymphs Abs: 0.9 10*3/uL (ref 0.7–4.0)
MCH: 34.2 pg — ABNORMAL HIGH (ref 26.0–34.0)
MCHC: 34.3 g/dL (ref 30.0–36.0)
MCV: 99.5 fL (ref 80.0–100.0)
Monocytes Absolute: 0.6 10*3/uL (ref 0.1–1.0)
Monocytes Relative: 8 %
Neutro Abs: 5.3 10*3/uL (ref 1.7–7.7)
Neutrophils Relative %: 78 %
Platelet Count: 149 10*3/uL — ABNORMAL LOW (ref 150–400)
RBC: 3.66 MIL/uL — ABNORMAL LOW (ref 3.87–5.11)
RDW: 13.1 % (ref 11.5–15.5)
WBC Count: 6.8 10*3/uL (ref 4.0–10.5)
nRBC: 0 % (ref 0.0–0.2)

## 2023-12-31 LAB — CMP (CANCER CENTER ONLY)
ALT: 8 U/L (ref 0–44)
AST: 17 U/L (ref 15–41)
Albumin: 3.9 g/dL (ref 3.5–5.0)
Alkaline Phosphatase: 57 U/L (ref 38–126)
Anion gap: 4 — ABNORMAL LOW (ref 5–15)
BUN: 15 mg/dL (ref 8–23)
CO2: 29 mmol/L (ref 22–32)
Calcium: 9 mg/dL (ref 8.9–10.3)
Chloride: 103 mmol/L (ref 98–111)
Creatinine: 0.73 mg/dL (ref 0.44–1.00)
GFR, Estimated: >60 mL/min (ref >60)
Glucose, Bld: 112 mg/dL — ABNORMAL HIGH (ref 70–99)
Potassium: 4 mmol/L (ref 3.5–5.1)
Sodium: 136 mmol/L (ref 135–145)
Total Bilirubin: 0.5 mg/dL (ref 0.0–1.2)
Total Protein: 6 g/dL — ABNORMAL LOW (ref 6.5–8.1)

## 2023-12-31 MED ORDER — SODIUM CHLORIDE 0.9% FLUSH
10.0000 mL | Freq: Once | INTRAVENOUS | Status: AC
  Administered 2023-12-31: 10 mL

## 2023-12-31 MED ORDER — HEPARIN SOD (PORK) LOCK FLUSH 100 UNIT/ML IV SOLN
500.0000 [IU] | Freq: Once | INTRAVENOUS | Status: AC
  Administered 2023-12-31: 500 [IU]

## 2023-12-31 NOTE — Assessment & Plan Note (Signed)
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 -pt started SBRT on 06/30/2023 and completed on 07/14/2023

## 2023-12-31 NOTE — Progress Notes (Signed)
 [facility]   [phone] Fax:([phone]   Clinic Follow up Note   Patient Care Team: Beam, Conway Dennis, MD as PCP - General (Family Medicine) Lucendia Rusk, MD as PCP - Cardiology (Cardiology) Zelphia Higashi, MD as Consulting Physician (Cardiothoracic Surgery) Sonja Heimdal, MD as Consulting Physician (Oncology) Burton, Lacie K, NP as Nurse Practitioner (Nurse Practitioner) Retta Caster, MD as Consulting Physician (Radiation Oncology)  Date of Service:  12/31/2023  CHIEF COMPLAINT: f/u of pancreatic cancer  CURRENT THERAPY:  Observation  Oncology History   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 -Patient declined Whipple surgery due to her advanced age and diagnosis of lung cancer at the same time. -Patient agreed with low intensity chemotherapy with single agent gemcitabine  and started on June 17, 2023, for a total of 3 to 4 months, followed by consolidation radiation if no cancer progression on next restaging CT. -restaging CT 09/08/2023 showed stable disease  Adenocarcinoma of left lung, stage 1 (HCC) -She had a history of stage Ia non-small cell lung cancer/adenocarcinoma of left lung, status post lobectomy in September 2019 -On surveillance CT scan in June 2024, she was found to have 7 mm right middle lobe nodule, increased to 12 mm and hypermetabolic on PET scan in September 2024, she also has 2 additional 7 mm lung nodules which were not hypermetabolic on PET scan. -pt started SBRT on 06/30/2023 and completed on 07/14/2023   Assessment & Plan Pancreatic cancer   Follow-up for pancreatic cancer. She reports improved appetite but has experienced a 3-pound weight loss since the last visit. No abdominal  pain, nausea, or jaundice. Blood counts are normal; kidney, liver function, and tumor marker results are pending. Completed radiation therapy on November 28, 2023. Coordination of care with Dr. Selinda Dales prioritizes pancreatic cancer management over lung cancer.   - Order CT chest, abdomen, and pelvis on February 27, 2024   - Monitor for symptoms such as abdominal pain, back pain, nausea, jaundice, and contact the clinic if she occurs    Anemia secondary to chemotherapy   Anemia secondary to chemotherapy has resolved with normal blood counts.    Sciatica   Sciatica is improving with physical therapy and a recent injection. No current back pain.   - Continue physical therapy for sciatica    Goals of Care   She is determined to fight her cancer and feels optimistic about her prognosis. She has a supportive family network, including her son and daughter-in-law, who are available to assist her as needed.  Plan - She is clinically doing well, lab reviewed, tumor marker still pending -follow-up in 3 months with lab and CT scan a few days before   SUMMARY OF ONCOLOGIC HISTORY: Oncology History  Adenocarcinoma of left lung, stage 1 (HCC)  07/06/2018 Initial Diagnosis   Adenocarcinoma of left lung, stage 1 (HCC)   02/15/2021 Cancer Staging   Staging form: Lung, AJCC 8th Edition - Clinical: Stage IA3 (cT1c, cN0, cM0) - Signed by Marlene Simas, MD on 02/15/2021   Adenocarcinoma of head of pancreas (HCC)  03/03/2023 Imaging   CT chest IMPRESSION: 1. Enlarging and concerning right lung nodules, the largest in the right middle lobe measuring up to 1.4 cm in diameter, highly suspicious for primary bronchogenic carcinoma. The right middle lobe nodule  is large enough to evaluate by PET-CT which is recommended. 2. No evidence of thoracic adenopathy or pleural effusion. No suspicious left lung nodule status post upper lobe resection. 3. Aortic Atherosclerosis (ICD10-I70.0) and Emphysema (ICD10-J43.9).    03/28/2023 PET scan   IMPRESSION: 1. 12 mm medial right middle lobe pulmonary nodule is hypermetabolic and consistent with primary neoplasm. 2. 7 mm right lower lobe nodule and 7 mm right upper lobe nodule do not demonstrate any hypermetabolism but will need close surveillance. 3. No mediastinal or hilar lymphadenopathy. 4. No findings for osseous metastatic disease. 5. Abnormal appearance of the pancreatic head and mild hypermetabolism worrisome for neoplasm. Recommend MRI abdomen without and with contrast for further evaluation. Aortic Atherosclerosis (ICD10-I70.0).   05/04/2023 Imaging   ABD MRI IMPRESSION: 1. Suspicious, solid lesion within the head of pancreas 3.0 x 2.4 x 2.4 cm. There is associated main duct dilatation within the head through tail of pancreas. Suspected lesion corresponds to the area of increased tracer uptake noted on the recent PET-CT. Findings are concerning for pancreatic adenocarcinoma. Motion artifact on the postcontrast images diminishes exam detail within the pancreas. Difficult to exclude involvement of the portal vein. The celiac trunk and proximal SMA appear uninvolved. Consider further evaluation with endoscopic ultrasound and possibly CTA of the abdomen. 2. Scattered T2 hyperintense and T1 hypointense foci within the liver are favored to represent multiple cysts. Unfortunately, motion artifact diminishes exam detail within the liver for the evaluation underlying enhancing liver lesions. Within this limitation, no obvious suspicious lesion identified at this time. 3. Right middle lobe lung nodule is again identified as seen on the previous PET-CT.   05/04/2023 Imaging   Brain MRI IMPRESSION: No evidence of intracranial metastatic disease.   05/16/2023 Tumor Marker   CA 19-9: 256   05/22/2023 Cancer Staging   Staging form: Exocrine Pancreas, AJCC 8th Edition - Clinical stage from 05/22/2023: Stage IB (cT2, cN0, cM0) - Signed by Burton, Lacie K,  NP on 06/09/2023 Stage prefix: Initial diagnosis Total positive nodes: 0   05/22/2023 Procedure   EUS by Dr. Brice Campi: 29 x 28 mm mass in the pancreatic head appearing suspicious for adeno, no malignant appearing lymph nodes were visualized.  Abuts the gastroduodenal artery but not involved with the SMA or celiac trunk.  T2 N0 by EUS    05/22/2023 Initial Biopsy    A. PANCREAS, HEAD LESION, FINE NEEDLE ASPIRATION:  FINAL MICROSCOPIC DIAGNOSIS:  - Adenocarcinoma    06/09/2023 Initial Diagnosis   Adenocarcinoma of head of pancreas (HCC)   06/17/2023 -  Chemotherapy   Patient is on Treatment Plan : LUNG Gemcitabine  (1000) D1,8 q21d      Genetic Testing   [facility] CancerNext-Expanded Panel+RNA was Negative. Report date is 06/24/2023.   The CancerNext-Expanded gene panel offered by [facility] and includes sequencing, rearrangement, and RNA analysis for the following 71 genes: AIP, ALK, APC, ATM, AXIN2, BAP1, BARD1, BMPR1A, BRCA1, BRCA2, BRIP1, CDC73, CDH1, CDK4, CDKN1B, CDKN2A, CHEK2, CTNNA1, DICER1, FH, FLCN, KIF1B, LZTR1, MAX, MEN1, MET, MLH1, MSH2, MSH3, MSH6, MUTYH, NF1, NF2, NTHL1, PALB2, PHOX2B, PMS2, POT1, PRKAR1A, PTCH1, PTEN, RAD51C, RAD51D, RB1, RET, SDHA, SDHAF2, SDHB, SDHC, SDHD, SMAD4, SMARCA4, SMARCB1, SMARCE1, STK11, SUFU, TMEM127, TP53, TSC1, TSC2, and VHL (sequencing and deletion/duplication); EGFR, EGLN1, HOXB13, KIT, MITF, PDGFRA, POLD1, and POLE (sequencing only); EPCAM and GREM1 (deletion/duplication only).    09/08/2023 Imaging   CT abdomen and pelvis with contrast  IMPRESSION: 1. Given cross modality comparison to the MRI of 05/04/2023, similar  to minimal decrease in size of the pancreatic head mass. No vascular involvement or metastatic disease identified. 2. Apparent gastric body wall thickening could be due to underdistention. Correlate with symptoms of gastritis. 3. Incidental findings, including: Aortic atherosclerosis (ICD10-I70.0) and emphysema (ICD10-J43.9). Small  hiatal hernia.      Discussed the use of AI scribe software for clinical note transcription with the patient, who gave verbal consent to proceed.  History of Present Illness The patient, an 83 year old with a history of pancreatic cancer, presents for a follow-up visit. She reports recovery from recent chemotherapy and radiation treatments. The primary issue has been sciatica, which is being managed with physical therapy and an injection in the back, resulting in significant improvement. The patient's appetite has not fully returned to normal, but she is eating better than before. However, she has lost three pounds since the last visit. The patient denies any pain other than occasional sciatica. Bowel movements are reported as normal, and there is no back pain. The patient lives alone but has a supportive family nearby. She reports sleeping well, occasionally taking Tylenol  PM, and is becoming more active as the sciatica is brought under control.     All other systems were reviewed with the patient and are negative.  MEDICAL HISTORY:  Past Medical History:  Diagnosis Date   Anxiety    Blood glucose elevated 05/27/2012   pt. 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   Overview:  Dr Nickey Barn  Last Assessment & Plan:  Relevant Hx: Course: Daily Update: Today's Plan:    GERD (gastroesophageal reflux disease)    Hemorrhoid 12/06/2015   History of hiatal hernia    History of radiation therapy    Right Lung- 06/30/23-07/14/23- Dr. Retta Caster   History of radiation therapy    Pancreas- 11/19/23-11/28/23- Dr. Retta Caster   HLD (hyperlipidemia) 12/03/2011   Hypertension    Lung cancer  (HCC) dx'd 04/2018   with recurrence 02/2023   Osteopenia 05/30/2015   Overview:  Bone density 05/2015 started fosamax    Pancreatic cancer (HCC) 02/2023   Primary localized osteoarthritis of left knee    Primary localized osteoarthritis of right knee 12/06/2015    SURGICAL HISTORY: Past Surgical History:  Procedure Laterality Date   ABDOMINAL HYSTERECTOMY     BIOPSY  05/22/2023   Procedure: BIOPSY;  Surgeon: Normie Becton., MD;  Location: [facility];  Service: Gastroenterology;;   CATARACT EXTRACTION, BILATERAL     COLONOSCOPY     ESOPHAGOGASTRODUODENOSCOPY (EGD) WITH PROPOFOL  N/A 05/22/2023   Procedure: ESOPHAGOGASTRODUODENOSCOPY (EGD) WITH PROPOFOL ;  Surgeon: Normie Becton., MD;  Location: [facility];  Service: Gastroenterology;  Laterality: N/A;   ESOPHAGOGASTRODUODENOSCOPY (EGD) WITH PROPOFOL  N/A 11/06/2023   Procedure: ESOPHAGOGASTRODUODENOSCOPY (EGD) WITH PROPOFOL ;  Surgeon: Brice Campi Albino Alu., MD;  Location: [facility];  Service: Gastroenterology;  Laterality: N/A;   EUS N/A 05/22/2023   Procedure: UPPER ENDOSCOPIC ULTRASOUND (EUS) RADIAL;  Surgeon: Normie Becton., MD;  Location: [facility];  Service: Gastroenterology;  Laterality: N/A;   EUS N/A 11/06/2023   Procedure: UPPER ENDOSCOPIC ULTRASOUND (EUS) RADIAL;  Surgeon: Brice Campi Albino Alu., MD;  Location: [facility];  Service:  Gastroenterology;  Laterality: N/A;   EYE SURGERY Bilateral    Cataract   FIDUCIAL MARKER PLACEMENT N/A 11/06/2023   Procedure: FIDUCIAL MARKER PLACEMENT;  Surgeon: Normie Becton., MD;  Location: [facility];  Service: Gastroenterology;  Laterality: N/A;   FINE NEEDLE ASPIRATION  05/22/2023   Procedure: FINE NEEDLE ASPIRATION;  Surgeon: Brice Campi Albino Alu., MD;  Location: [facility];  Service: Gastroenterology;;   implantable loop recorder placement  10/26/2018   MDT LINQ Reveal XT ILR implanted for evaluation of palpitations and atrial fibrillation in  office by Dr Nunzio Belch, Device SN [id number])    IR IMAGING GUIDED PORT INSERTION  06/24/2023   KNEE SURGERY  05/01/2016   left uncompartmental    PARTIAL KNEE ARTHROPLASTY Right 12/18/2015   Procedure: UNICOMPARTMENTAL RIGHT KNEE;  Surgeon: Elly Habermann, MD;  Location[facility] OR;  Service: Orthopedics;  Laterality: Right;   PARTIAL KNEE ARTHROPLASTY Left 05/01/2016   Procedure: UNICOMPARTMENTAL KNEE;  Surgeon: Elly Habermann, MD;  Location: [facility] OR;  Service: Orthopedics;  Laterality: Left;   POLYPECTOMY  05/22/2023   Procedure: POLYPECTOMY;  Surgeon: Mansouraty, Albino Alu., MD;  Location: [facility];  Service: Gastroenterology;;   TOTAL ABDOMINAL HYSTERECTOMY W/ BILATERAL SALPINGOOPHORECTOMY     VIDEO ASSISTED THORACOSCOPY (VATS)/ LOBECTOMY Left 05/13/2018   Procedure: VIDEO ASSISTED THORACOSCOPY (VATS)/LEFT UPPER LOBECTOMY;  Surgeon: Zelphia Higashi, MD;  Location: [facility] OR;  Service: Thoracic;  Laterality: Left;    I have reviewed the social history and family history with the patient and they are unchanged from previous note.  ALLERGIES:  is allergic to ambien  [zolpidem  tartrate], sulfa antibiotics, codeine, hydrocodone , and meperidine.  MEDICATIONS:  Current Outpatient Medications  Medication Sig Dispense Refill   albuterol  (VENTOLIN  HFA) 108 (90 Base) MCG/ACT inhaler Inhale 1-2 puffs into the lungs 3 (three) times daily as needed for wheezing or shortness of breath. 1 each 0   Calcium  Carb-Cholecalciferol (CALCIUM -VITAMIN D3) 600-200 MG-UNIT TABS Take by mouth daily.     cetirizine (ZYRTEC) 10 MG tablet Take 10 mg by mouth as needed for allergies.     diphenhydramine -acetaminophen  (TYLENOL  PM) 25-500 MG TABS tablet Take 2 tablets by mouth at bedtime as needed (for sleep).     escitalopram (LEXAPRO) 20 MG tablet Take by mouth daily.     fluticasone  (FLONASE ) 50 MCG/ACT nasal spray PLACE 1-2 SPRAYS INTO BOTH NOSTRILS DAILY. 48 mL 1   lidocaine -prilocaine  (EMLA ) cream Apply to affected area  once 30 g 3   metoprolol  succinate (TOPROL -XL) 50 MG 24 hr tablet Take 50 mg by mouth daily. Take with or immediately following a meal.     Multiple Vitamins-Minerals (MULTIVITAMIN PO) Take 1 tablet by mouth daily.     ondansetron  (ZOFRAN ) 8 MG tablet Take 1 tablet (8 mg total) by mouth every 8 (eight) hours as needed for nausea or vomiting. 30 tablet 1   pantoprazole (PROTONIX) 40 MG tablet Take 40 mg by mouth daily.     polyethylene glycol (MIRALAX  / GLYCOLAX ) packet Take 8.5 g by mouth every other day.     prochlorperazine  (COMPAZINE ) 10 MG tablet Take 1 tablet (10 mg total) by mouth every 6 (six) hours as needed for nausea or vomiting. 30 tablet 1   simvastatin  (ZOCOR ) 20 MG tablet Take 20 mg by mouth daily.     triamcinolone  ointment (KENALOG ) 0.5 % Apply 1 Application topically 2 (two) times daily. 30 g 0   No current facility-administered medications for this visit.    PHYSICAL EXAMINATION: ECOG PERFORMANCE STATUS: 1 -  Symptomatic but completely ambulatory  Vitals:   12/31/23 1200 12/31/23 1201  BP: (!) 142/78 (!) 140/76  Pulse: 65   Resp: (!) 22   Temp: 98.3 F (36.8 C)   SpO2: 93%    Wt Readings from Last 3 Encounters:  12/31/23 131 lb 12.8 oz (59.8 kg)  12/29/23 129 lb 2 oz (58.6 kg)  11/10/23 128 lb 4 oz (58.2 kg)     GENERAL:alert, no distress and comfortable SKIN: skin color, texture, turgor are normal, no rashes or significant lesions EYES: normal, Conjunctiva are pink and non-injected, sclera clear NECK: supple, thyroid normal size, non-tender, without nodularity LYMPH:  no palpable lymphadenopathy in the cervical, axillary  LUNGS: clear to auscultation and percussion with normal breathing effort HEART: regular rate & rhythm and no murmurs and no lower extremity edema ABDOMEN:abdomen soft, non-tender and normal bowel sounds Musculoskeletal:no cyanosis of digits and no clubbing  NEURO: alert & oriented x 3 with fluent speech, no focal motor/sensory  deficits  Physical Exam    LABORATORY DATA:  I have reviewed the data as listed    Latest Ref Rng & Units 12/31/2023   11:30 AM 10/28/2023    2:56 PM 09/22/2023    1:39 PM  CBC  WBC 4.0 - 10.5 K/uL 6.8  8.4  4.8   Hemoglobin 12.0 - 15.0 g/dL 40.9  81.1  9.1   Hematocrit 36.0 - 46.0 % 36.4  36.5  27.1   Platelets 150 - 400 K/uL 149  142  270         Latest Ref Rng & Units 12/31/2023   11:30 AM 10/28/2023    2:56 PM 09/22/2023    1:39 PM  CMP  Glucose 70 - 99 mg/dL 914  782  956   BUN 8 - 23 mg/dL 15  18  18    Creatinine 0.44 - 1.00 mg/dL 2.13  0.86  5.78   Sodium 135 - 145 mmol/L 136  134  131   Potassium 3.5 - 5.1 mmol/L 4.0  3.6  4.4   Chloride 98 - 111 mmol/L 103  101  99   CO2 22 - 32 mmol/L 29  27  25    Calcium  8.9 - 10.3 mg/dL 9.0  9.2  9.3   Total Protein 6.5 - 8.1 g/dL 6.0  5.7  6.0   Total Bilirubin 0.0 - 1.2 mg/dL 0.5  0.4  0.5   Alkaline Phos 38 - 126 U/L 57  51  69   AST 15 - 41 U/L 17  15  72   ALT 0 - 44 U/L 8  7  35       RADIOGRAPHIC STUDIES: I have personally reviewed the radiological images as listed and agreed with the findings in the report. No results found.    Orders Placed This Encounter  Procedures   CT ABD PELVIS W/WO CM ONCOLOGY PANCREATIC PROTOCOL    Standing Status:   Future    Expected Date:   02/27/2024    Expiration Date:   12/30/2024    If indicated for the ordered procedure, I authorize the administration of contrast media per Radiology protocol:   Yes    Does the patient have a contrast media/X-ray dye allergy?:   No    Preferred imaging location?:   [facility]    If indicated for the ordered procedure, I authorize the administration of oral contrast media per Radiology protocol:   Yes   All questions were answered. The patient  knows to call the clinic with any problems, questions or concerns. No barriers to learning was detected. The total time spent in the appointment was 25 minutes.     Sonja Edgefield, MD 12/31/2023

## 2024-01-01 LAB — CANCER ANTIGEN 19-9: CA 19-9: 99 U/mL — ABNORMAL HIGH (ref 0–35)

## 2024-01-04 ENCOUNTER — Other Ambulatory Visit: Payer: Self-pay

## 2024-02-27 ENCOUNTER — Other Ambulatory Visit: Payer: Medicare Other

## 2024-02-27 ENCOUNTER — Ambulatory Visit (HOSPITAL_COMMUNITY)
Admission: RE | Admit: 2024-02-27 | Discharge: 2024-02-27 | Disposition: A | Discharge status: Home / Self Care | Source: Ambulatory Visit | Attending: Internal Medicine | Admitting: Internal Medicine

## 2024-02-27 ENCOUNTER — Inpatient Hospital Stay: Attending: Internal Medicine

## 2024-02-27 ENCOUNTER — Encounter (HOSPITAL_COMMUNITY): Payer: Self-pay

## 2024-02-27 DIAGNOSIS — C3490 Malignant neoplasm of unspecified part of unspecified bronchus or lung: Secondary | ICD-10-CM | POA: Insufficient documentation

## 2024-02-27 DIAGNOSIS — C250 Malignant neoplasm of head of pancreas: Secondary | ICD-10-CM | POA: Diagnosis present

## 2024-02-27 DIAGNOSIS — Z85118 Personal history of other malignant neoplasm of bronchus and lung: Secondary | ICD-10-CM | POA: Insufficient documentation

## 2024-02-27 DIAGNOSIS — Z95828 Presence of other vascular implants and grafts: Secondary | ICD-10-CM

## 2024-02-27 LAB — CBC WITH DIFFERENTIAL (CANCER CENTER ONLY)
Abs Immature Granulocytes: 0.03 10*3/uL (ref 0.00–0.07)
Basophils Absolute: 0 10*3/uL (ref 0.0–0.1)
Basophils Relative: 0 %
Eosinophils Absolute: 0.1 10*3/uL (ref 0.0–0.5)
Eosinophils Relative: 2 %
HCT: 34.6 % — ABNORMAL LOW (ref 36.0–46.0)
Hemoglobin: 12.1 g/dL (ref 12.0–15.0)
Immature Granulocytes: 1 %
Lymphocytes Relative: 16 %
Lymphs Abs: 1.1 10*3/uL (ref 0.7–4.0)
MCH: 35.3 pg — ABNORMAL HIGH (ref 26.0–34.0)
MCHC: 35 g/dL (ref 30.0–36.0)
MCV: 100.9 fL — ABNORMAL HIGH (ref 80.0–100.0)
Monocytes Absolute: 0.5 10*3/uL (ref 0.1–1.0)
Monocytes Relative: 8 %
Neutro Abs: 4.9 10*3/uL (ref 1.7–7.7)
Neutrophils Relative %: 73 %
Platelet Count: 157 10*3/uL (ref 150–400)
RBC: 3.43 MIL/uL — ABNORMAL LOW (ref 3.87–5.11)
RDW: 12.9 % (ref 11.5–15.5)
WBC Count: 6.6 10*3/uL (ref 4.0–10.5)
nRBC: 0 % (ref 0.0–0.2)

## 2024-02-27 LAB — CMP (CANCER CENTER ONLY)
ALT: 10 U/L (ref 0–44)
AST: 19 U/L (ref 15–41)
Albumin: 4.1 g/dL (ref 3.5–5.0)
Alkaline Phosphatase: 57 U/L (ref 38–126)
Anion gap: 5 (ref 5–15)
BUN: 13 mg/dL (ref 8–23)
CO2: 28 mmol/L (ref 22–32)
Calcium: 9.6 mg/dL (ref 8.9–10.3)
Chloride: 103 mmol/L (ref 98–111)
Creatinine: 0.76 mg/dL (ref 0.44–1.00)
GFR, Estimated: >60 mL/min (ref >60)
Glucose, Bld: 113 mg/dL — ABNORMAL HIGH (ref 70–99)
Potassium: 3.9 mmol/L (ref 3.5–5.1)
Sodium: 136 mmol/L (ref 135–145)
Total Bilirubin: 0.7 mg/dL (ref 0.0–1.2)
Total Protein: 6.2 g/dL — ABNORMAL LOW (ref 6.5–8.1)

## 2024-02-27 MED ORDER — SODIUM CHLORIDE 0.9% FLUSH
10.0000 mL | Freq: Once | INTRAVENOUS | Status: AC
  Administered 2024-02-27: 10 mL

## 2024-02-27 MED ORDER — HEPARIN SOD (PORK) LOCK FLUSH 100 UNIT/ML IV SOLN
500.0000 [IU] | Freq: Once | INTRAVENOUS | Status: AC
  Administered 2024-02-27: 500 [IU] via INTRAVENOUS

## 2024-02-27 MED ORDER — IOHEXOL 300 MG/ML  SOLN
100.0000 mL | Freq: Once | INTRAMUSCULAR | Status: AC | PRN
  Administered 2024-02-27: 100 mL via INTRAVENOUS

## 2024-02-27 MED ORDER — HEPARIN SOD (PORK) LOCK FLUSH 100 UNIT/ML IV SOLN
INTRAVENOUS | Status: AC
  Filled 2024-02-27: qty 5

## 2024-02-27 MED ORDER — SODIUM CHLORIDE (PF) 0.9 % IJ SOLN
INTRAMUSCULAR | Status: AC
  Filled 2024-02-27: qty 50

## 2024-02-28 LAB — CANCER ANTIGEN 19-9: CA 19-9: 25 U/mL (ref 0–35)

## 2024-03-02 ENCOUNTER — Ambulatory Visit: Payer: Medicare Other | Admitting: Internal Medicine

## 2024-03-02 ENCOUNTER — Inpatient Hospital Stay (HOSPITAL_BASED_OUTPATIENT_CLINIC_OR_DEPARTMENT_OTHER): Admitting: Hematology

## 2024-03-02 ENCOUNTER — Encounter: Payer: Self-pay | Admitting: Hematology

## 2024-03-02 VITALS — BP 132/66 | HR 60 | Temp 98.3°F | Resp 17 | Ht 62.5 in | Wt 132.0 lb

## 2024-03-02 DIAGNOSIS — C250 Malignant neoplasm of head of pancreas: Secondary | ICD-10-CM | POA: Diagnosis not present

## 2024-03-02 NOTE — Assessment & Plan Note (Signed)
 cT3N0M0 -Incidental findings on the CT scan for lung cancer screening/follow-up -Diagnosed in September 2024 by EUS baseline CA 19.9 256 -Patient has synchronized 2 hypermetabolic lesion in her lungs, highly suspicious for malignancy. -Patient declined Whipple surgery due to her advanced age and diagnosis of lung cancer at the same time. -Patient agreed with low intensity chemotherapy with single agent gemcitabine  and started on October 8, 2024and completed in Jan 2025, followed by consolidation radiation SBRT which she completed in March 2025 -restaging CT 02/27/24 showed improved pancreatic mass, and a small indeterminate liver lesion, no definitive evidence of metastasis.

## 2024-03-02 NOTE — Progress Notes (Signed)
 [name]   Telephone:(336) [phone] Fax:(336) [phone]   Clinic Follow up Note   Patient Care Team: [name], Crystal POUR, [name] as PCP - General (Family Medicine) Dann Crystal RAMAN, [name] as PCP - Cardiology (Cardiology) Crystal Crystal BROCKS, [name] as Consulting Physician (Cardiothoracic Surgery) Crystal Callander, [name] as Consulting Physician (Oncology) Crystal Mayme POUR, [name] as Nurse Practitioner (Nurse Practitioner) Crystal Agent, [name] as Consulting Physician (Radiation Oncology)  Date of Service:  03/02/2024  CHIEF COMPLAINT: f/u of pancreatic cancer  CURRENT THERAPY:  Observation  Oncology History   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 -Patient declined Whipple surgery due to her advanced age and diagnosis of lung cancer at the same time. -Patient agreed with low intensity chemotherapy with single Fisher gemcitabine  and started on October 8, 2024and completed in Jan 2025, followed by consolidation radiation SBRT which she completed in March 2025 -restaging CT 02/27/24 showed improved pancreatic mass, and a small indeterminate liver lesion, no definitive evidence of metastasis.  Assessment & Plan Pancreatic cancer Recent CT scan shows a reduction in tumor size with no definitive metastasis. An indeterminate liver spot is likely benign. Tumor marker decreased to 25 from 79 over five months. Blood tests indicate normal renal and hepatic function, no anemia, and improved hematologic parameters, though protein levels are low. Cancer persists with potential for future progression. Chemotherapy remains an option if progression occurs, but radiation may not be viable soon. She is determined to continue treatment and is open to chemotherapy if necessary. - Repeat  CT scan of abdomen and pelvis in three months - Monitor for symptoms such as pain, weight loss, nausea, or anorexia and report if she occurs - Encourage increased protein intake - Schedule lab work and port flush in six weeks - Perform CT scan one week before next appointment - Consider chemotherapy if cancer progresses  Sciatica Resolved with physical therapy and daily exercises. No current need for medication.  Goals of Care She understands the goal of care is palliative, her pancreatic cancer is very unlikely curable.  She is determined to combat cancer and is willing to undergo chemotherapy if necessary, motivated by her sister's recent passing from cancer.  Plan - Lab and CT scan reviewed, no concern for cancer progression - Continue observation for now - Follow-up in 3 months with repeated CT scan and lab - Lab and port flush in 6 weeks   SUMMARY OF ONCOLOGIC HISTORY: Oncology History  Adenocarcinoma of left lung, stage 1 (HCC)  07/06/2018 Initial Diagnosis   Adenocarcinoma of left lung, stage 1 (HCC)   02/15/2021 Cancer Staging   Staging form: Lung, AJCC 8th Edition - Clinical: Stage IA3 (cT1c, cN0, cM0) - Signed by Crystal Sherrod, [name] on 02/15/2021   Adenocarcinoma of head of pancreas (HCC)  03/03/2023 Imaging   CT chest IMPRESSION: 1. Enlarging and concerning right lung nodules, the largest in the right middle lobe measuring up to 1.4 cm in diameter, highly suspicious for primary bronchogenic carcinoma. The right middle lobe nodule is large enough to evaluate by PET-CT which is recommended. 2. No evidence of thoracic adenopathy or pleural effusion. No suspicious left lung nodule status post upper lobe resection. 3. Aortic Atherosclerosis (ICD10-I70.0) and Emphysema (ICD10-J43.9).   03/28/2023 PET scan   IMPRESSION: 1. 12 mm medial right middle lobe  pulmonary nodule is hypermetabolic and consistent with primary neoplasm. 2. 7 mm right lower lobe nodule and 7 mm right upper  lobe nodule do not demonstrate any hypermetabolism but will need close surveillance. 3. No mediastinal or hilar lymphadenopathy. 4. No findings for osseous metastatic disease. 5. Abnormal appearance of the pancreatic head and mild hypermetabolism worrisome for neoplasm. Recommend MRI abdomen without and with contrast for further evaluation. Aortic Atherosclerosis (ICD10-I70.0).   05/04/2023 Imaging   ABD MRI IMPRESSION: 1. Suspicious, solid lesion within the head of pancreas 3.0 x 2.4 x 2.4 cm. There is associated main duct dilatation within the head through tail of pancreas. Suspected lesion corresponds to the area of increased tracer uptake noted on the recent PET-CT. Findings are concerning for pancreatic adenocarcinoma. Motion artifact on the postcontrast images diminishes exam detail within the pancreas. Difficult to exclude involvement of the portal vein. The celiac trunk and proximal SMA appear uninvolved. Consider further evaluation with endoscopic ultrasound and possibly CTA of the abdomen. 2. Scattered T2 hyperintense and T1 hypointense foci within the liver are favored to represent multiple cysts. Unfortunately, motion artifact diminishes exam detail within the liver for the evaluation underlying enhancing liver lesions. Within this limitation, no obvious suspicious lesion identified at this time. 3. Right middle lobe lung nodule is again identified as seen on the previous PET-CT.   05/04/2023 Imaging   Brain MRI IMPRESSION: No evidence of intracranial metastatic disease.   05/16/2023 Tumor Marker   CA 19-9: 256   05/22/2023 Cancer Staging   Staging form: Exocrine Pancreas, AJCC 8th Edition - Clinical stage from 05/22/2023: Stage IB (cT2, cN0, cM0) - Signed by Burton, Lacie K, [name] on 06/09/2023 Stage prefix: Initial diagnosis Total positive nodes: 0   05/22/2023 Procedure   EUS by Dr. Wilhelmenia: 29 x 28 mm mass in the pancreatic head appearing suspicious for adeno, no  malignant appearing lymph nodes were visualized.  Abuts the gastroduodenal artery but not involved with the SMA or celiac trunk.  T2 N0 by EUS    05/22/2023 Initial Biopsy    A. PANCREAS, HEAD LESION, FINE NEEDLE ASPIRATION:  FINAL MICROSCOPIC DIAGNOSIS:  - Adenocarcinoma    06/09/2023 Initial Diagnosis   Adenocarcinoma of head of pancreas (HCC)   06/17/2023 -  Chemotherapy   Patient is on Treatment Plan : LUNG Gemcitabine  (1000) D1,8 q21d      Genetic Testing   [facility] CancerNext-Expanded Panel+RNA was Negative. Report date is 06/24/2023.   The CancerNext-Expanded gene panel offered by [facility] and includes sequencing, rearrangement, and RNA analysis for the following 71 genes: AIP, ALK, APC, ATM, AXIN2, BAP1, BARD1, BMPR1A, BRCA1, BRCA2, BRIP1, CDC73, CDH1, CDK4, CDKN1B, CDKN2A, CHEK2, CTNNA1, DICER1, FH, FLCN, KIF1B, LZTR1, MAX, MEN1, MET, MLH1, MSH2, MSH3, MSH6, MUTYH, NF1, NF2, NTHL1, PALB2, PHOX2B, PMS2, POT1, PRKAR1A, PTCH1, PTEN, RAD51C, RAD51D, RB1, RET, SDHA, SDHAF2, SDHB, SDHC, SDHD, SMAD4, SMARCA4, SMARCB1, SMARCE1, STK11, SUFU, TMEM127, TP53, TSC1, TSC2, and VHL (sequencing and deletion/duplication); EGFR, EGLN1, HOXB13, KIT, MITF, PDGFRA, POLD1, and POLE (sequencing only); EPCAM and GREM1 (deletion/duplication only).    09/08/2023 Imaging   CT abdomen and pelvis with contrast  IMPRESSION: 1. Given cross modality comparison to the MRI of 05/04/2023, similar to minimal decrease in size of the pancreatic head mass. No vascular involvement or metastatic disease identified. 2. Apparent gastric body wall thickening could be due to underdistention. Correlate with symptoms of gastritis. 3. Incidental findings, including: Aortic atherosclerosis (ICD10-I70.0) and emphysema (ICD10-J43.9). Small hiatal hernia.  Discussed the use of AI scribe software for clinical note transcription with the patient, who gave verbal consent to proceed.  History of Present Illness [name] is an 83 year old female with pancreatic cancer who presents for follow-up.  She has no new symptoms, including stomach pain, and reports increased energy since her last visit. There is no weight loss, nausea, or loss of appetite. A CT scan on February 27, 2024, shows a decrease in tumor marker to 25 from 79 five months ago, following radiation therapy three months prior. The scan shows no definitive evidence of metastasis, with an indeterminate spot on the liver likely benign. Her family history includes a sister who recently passed away from metastatic cancer involving the stomach, esophagus, and pancreas.     All other systems were reviewed with the patient and are negative.  MEDICAL HISTORY:  Past Medical History:  Diagnosis Date   Anxiety    Blood glucose elevated 05/27/2012   pt. 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   Overview:  Dr Rollin  Last Assessment & Plan:  Relevant Hx: Course: Daily Update: Today's Plan:    GERD (gastroesophageal reflux disease)    Hemorrhoid 12/06/2015   History of hiatal hernia    History of radiation therapy    Right Lung- 06/30/23-07/14/23- Dr. Lynwood Nasuti   History of radiation therapy    Pancreas- 11/19/23-11/28/23- Dr. Lynwood Nasuti   HLD (hyperlipidemia) 12/03/2011   Hypertension    Lung cancer (HCC) dx'd 04/2018   with recurrence 02/2023   Osteopenia 05/30/2015   Overview:  Bone density 05/2015 started fosamax    Pancreatic cancer (HCC) 02/2023   Primary localized osteoarthritis of left knee    Primary localized osteoarthritis of right knee 12/06/2015    SURGICAL HISTORY: Past Surgical History:  Procedure Laterality Date    ABDOMINAL HYSTERECTOMY     BIOPSY  05/22/2023   Procedure: BIOPSY;  Surgeon: Wilhelmenia Aloha Raddle., [name];  Location: [facility];  Service: Gastroenterology;;   CATARACT EXTRACTION, BILATERAL     COLONOSCOPY     ESOPHAGOGASTRODUODENOSCOPY (EGD) WITH PROPOFOL  N/A 05/22/2023   Procedure: ESOPHAGOGASTRODUODENOSCOPY (EGD) WITH PROPOFOL ;  Surgeon: Wilhelmenia Aloha Raddle., [name];  Location: [facility];  Service: Gastroenterology;  Laterality: N/A;   ESOPHAGOGASTRODUODENOSCOPY (EGD) WITH PROPOFOL  N/A 11/06/2023   Procedure: ESOPHAGOGASTRODUODENOSCOPY (EGD) WITH PROPOFOL ;  Surgeon: Wilhelmenia Aloha Raddle., [name];  Location: [facility];  Service: Gastroenterology;  Laterality: N/A;   EUS N/A 05/22/2023   Procedure: UPPER ENDOSCOPIC ULTRASOUND (EUS) RADIAL;  Surgeon: Wilhelmenia Aloha Raddle., [name];  Location: [facility];  Service: Gastroenterology;  Laterality: N/A;   EUS N/A 11/06/2023   Procedure: UPPER ENDOSCOPIC ULTRASOUND (EUS) RADIAL;  Surgeon: Wilhelmenia Aloha Raddle., [name];  Location: [facility];  Service: Gastroenterology;  Laterality: N/A;   EYE SURGERY Bilateral    Cataract   FIDUCIAL MARKER PLACEMENT N/A 11/06/2023   Procedure: FIDUCIAL MARKER PLACEMENT;  Surgeon: Wilhelmenia Aloha Raddle., [name];  Location: [facility];  Service: Gastroenterology;  Laterality: N/A;   FINE NEEDLE ASPIRATION  05/22/2023   Procedure: FINE NEEDLE ASPIRATION;  Surgeon: Wilhelmenia Aloha Raddle., [name];  Location: [facility];  Service: Gastroenterology;;  implantable loop recorder placement  10/26/2018   MDT LINQ Reveal XT ILR implanted for evaluation of palpitations and atrial fibrillation in office by Dr Kelsie, Device SN [id number])    IR IMAGING GUIDED PORT INSERTION  06/24/2023   KNEE SURGERY  05/01/2016   left uncompartmental    PARTIAL KNEE ARTHROPLASTY Right 12/18/2015   Procedure: UNICOMPARTMENTAL RIGHT KNEE;  Surgeon: Crystal Millman, [name];  Location: [facility];  Service: Orthopedics;  Laterality: Right;   PARTIAL KNEE  ARTHROPLASTY Left 05/01/2016   Procedure: UNICOMPARTMENTAL KNEE;  Surgeon: Crystal Millman, [name];  Location: [facility] OR;  Service: Orthopedics;  Laterality: Left;   POLYPECTOMY  05/22/2023   Procedure: POLYPECTOMY;  Surgeon: Mansouraty, Aloha Raddle., [name];  Location: [facility];  Service: Gastroenterology;;   TOTAL ABDOMINAL HYSTERECTOMY W/ BILATERAL SALPINGOOPHORECTOMY     VIDEO ASSISTED THORACOSCOPY (VATS)/ LOBECTOMY Left 05/13/2018   Procedure: VIDEO ASSISTED THORACOSCOPY (VATS)/LEFT UPPER LOBECTOMY;  Surgeon: Crystal Crystal BROCKS, [name];  Location: [facility] OR;  Service: Thoracic;  Laterality: Left;    I have reviewed the social history and family history with the patient and they are unchanged from previous note.  ALLERGIES:  is allergic to ambien  [zolpidem  tartrate], sulfa antibiotics, codeine, hydrocodone , and meperidine.  MEDICATIONS:  Current Outpatient Medications  Medication Sig Dispense Refill   albuterol  (VENTOLIN  HFA) 108 (90 Base) MCG/ACT inhaler Inhale 1-2 puffs into the lungs 3 (three) times daily as needed for wheezing or shortness of breath. 1 each 0   Calcium  Carb-Cholecalciferol (CALCIUM -VITAMIN D3) 600-200 MG-UNIT TABS Take by mouth daily.     cetirizine (ZYRTEC) 10 MG tablet Take 10 mg by mouth as needed for allergies.     diphenhydramine -acetaminophen  (TYLENOL  PM) 25-500 MG TABS tablet Take 2 tablets by mouth at bedtime as needed (for sleep).     escitalopram (LEXAPRO) 20 MG tablet Take by mouth daily.     fluticasone  (FLONASE ) 50 MCG/ACT nasal spray PLACE 1-2 SPRAYS INTO BOTH NOSTRILS DAILY. 48 mL 1   lidocaine -prilocaine  (EMLA ) cream Apply to affected area once 30 g 3   metoprolol  succinate (TOPROL -XL) 50 MG 24 hr tablet Take 50 mg by mouth daily. Take with or immediately following a meal.     Multiple Vitamins-Minerals (MULTIVITAMIN PO) Take 1 tablet by mouth daily.     ondansetron  (ZOFRAN ) 8 MG tablet Take 1 tablet (8 mg total) by mouth every 8 (eight) hours as needed for nausea or  vomiting. 30 tablet 1   pantoprazole (PROTONIX) 40 MG tablet Take 40 mg by mouth daily.     polyethylene glycol (MIRALAX  / GLYCOLAX ) packet Take 8.5 g by mouth every other day.     prochlorperazine  (COMPAZINE ) 10 MG tablet Take 1 tablet (10 mg total) by mouth every 6 (six) hours as needed for nausea or vomiting. 30 tablet 1   simvastatin  (ZOCOR ) 20 MG tablet Take 20 mg by mouth daily.     triamcinolone  ointment (KENALOG ) 0.5 % Apply 1 Application topically 2 (two) times daily. 30 g 0   No current facility-administered medications for this visit.    PHYSICAL EXAMINATION: ECOG PERFORMANCE STATUS: 0 - Asymptomatic  Vitals:   03/02/24 1446  BP: 132/66  Pulse: 60  Resp: 17  Temp: 98.3 F (36.8 C)  SpO2: 98%   Wt Readings from Last 3 Encounters:  03/02/24 132 lb (59.9 kg)  12/31/23 131 lb 12.8 oz (59.8 kg)  12/29/23 129 lb 2 oz (58.6 kg)     GENERAL:alert, no distress and comfortable SKIN: skin color, texture, turgor  are normal, no rashes or significant lesions EYES: normal, Conjunctiva are pink and non-injected, sclera clear NECK: supple, thyroid normal size, non-tender, without nodularity LYMPH:  no palpable lymphadenopathy in the cervical, axillary  LUNGS: clear to auscultation and percussion with normal breathing effort HEART: regular rate & rhythm and no murmurs and no lower extremity edema ABDOMEN:abdomen soft, non-tender and normal bowel sounds Musculoskeletal:no cyanosis of digits and no clubbing  NEURO: alert & oriented x 3 with fluent speech, no focal motor/sensory deficits  Physical Exam    LABORATORY DATA:  I have reviewed the data as listed    Latest Ref Rng & Units 02/27/2024   10:03 AM 12/31/2023   11:30 AM 10/28/2023    2:56 PM  CBC  WBC 4.0 - 10.5 K/uL 6.6  6.8  8.4   Hemoglobin 12.0 - 15.0 g/dL 87.8  87.4  87.9   Hematocrit 36.0 - 46.0 % 34.6  36.4  36.5   Platelets 150 - 400 K/uL 157  149  142         Latest Ref Rng & Units 02/27/2024   10:03 AM  12/31/2023   11:30 AM 10/28/2023    2:56 PM  CMP  Glucose 70 - 99 mg/dL 886  887  860   BUN 8 - 23 mg/dL 13  15  18    Creatinine 0.44 - 1.00 mg/dL 9.23  9.26  9.27   Sodium 135 - 145 mmol/L 136  136  134   Potassium 3.5 - 5.1 mmol/L 3.9  4.0  3.6   Chloride 98 - 111 mmol/L 103  103  101   CO2 22 - 32 mmol/L 28  29  27    Calcium  8.9 - 10.3 mg/dL 9.6  9.0  9.2   Total Protein 6.5 - 8.1 g/dL 6.2  6.0  5.7   Total Bilirubin 0.0 - 1.2 mg/dL 0.7  0.5  0.4   Alkaline Phos 38 - 126 U/L 57  57  51   AST 15 - 41 U/L 19  17  15    ALT 0 - 44 U/L 10  8  7        RADIOGRAPHIC STUDIES: I have personally reviewed the radiological images as listed and agreed with the findings in the report. No results found.    Orders Placed This Encounter  Procedures   CT CHEST ABDOMEN PELVIS W CONTRAST    Standing Status:   Future    Expected Date:   05/25/2024    Expiration Date:   03/02/2025    If indicated for the ordered procedure, I authorize the administration of contrast media per Radiology protocol:   Yes    Does the patient have a contrast media/X-ray dye allergy?:   No    Preferred imaging location?:   [facility]    Release to patient:   Immediate    If indicated for the ordered procedure, I authorize the administration of oral contrast media per Radiology protocol:   Yes   All questions were answered. The patient knows to call the clinic with any problems, questions or concerns. No barriers to learning was detected. The total time spent in the appointment was 25 minutes, including review of chart and various tests results, discussions about plan of care and coordination of care plan     Onita Mattock, [name] 03/02/2024

## 2024-03-03 ENCOUNTER — Other Ambulatory Visit: Payer: Self-pay

## 2024-04-13 ENCOUNTER — Other Ambulatory Visit: Payer: Self-pay

## 2024-04-13 ENCOUNTER — Inpatient Hospital Stay: Attending: Internal Medicine

## 2024-04-13 DIAGNOSIS — C3490 Malignant neoplasm of unspecified part of unspecified bronchus or lung: Secondary | ICD-10-CM

## 2024-04-13 DIAGNOSIS — C250 Malignant neoplasm of head of pancreas: Secondary | ICD-10-CM | POA: Insufficient documentation

## 2024-04-13 DIAGNOSIS — C3492 Malignant neoplasm of unspecified part of left bronchus or lung: Secondary | ICD-10-CM

## 2024-04-13 DIAGNOSIS — Z85118 Personal history of other malignant neoplasm of bronchus and lung: Secondary | ICD-10-CM | POA: Insufficient documentation

## 2024-04-13 DIAGNOSIS — Z95828 Presence of other vascular implants and grafts: Secondary | ICD-10-CM

## 2024-04-13 LAB — CBC WITH DIFFERENTIAL (CANCER CENTER ONLY)
Abs Immature Granulocytes: 0.01 K/uL (ref 0.00–0.07)
Basophils Absolute: 0 K/uL (ref 0.0–0.1)
Basophils Relative: 0 %
Eosinophils Absolute: 0.1 K/uL (ref 0.0–0.5)
Eosinophils Relative: 2 %
HCT: 34.8 % — ABNORMAL LOW (ref 36.0–46.0)
Hemoglobin: 12.1 g/dL (ref 12.0–15.0)
Immature Granulocytes: 0 %
Lymphocytes Relative: 21 %
Lymphs Abs: 1 K/uL (ref 0.7–4.0)
MCH: 35.1 pg — ABNORMAL HIGH (ref 26.0–34.0)
MCHC: 34.8 g/dL (ref 30.0–36.0)
MCV: 100.9 fL — ABNORMAL HIGH (ref 80.0–100.0)
Monocytes Absolute: 0.4 K/uL (ref 0.1–1.0)
Monocytes Relative: 9 %
Neutro Abs: 3.4 K/uL (ref 1.7–7.7)
Neutrophils Relative %: 68 %
Platelet Count: 141 K/uL — ABNORMAL LOW (ref 150–400)
RBC: 3.45 MIL/uL — ABNORMAL LOW (ref 3.87–5.11)
RDW: 12 % (ref 11.5–15.5)
WBC Count: 5 K/uL (ref 4.0–10.5)
nRBC: 0 % (ref 0.0–0.2)

## 2024-04-13 LAB — CMP (CANCER CENTER ONLY)
ALT: 13 U/L (ref 0–44)
AST: 25 U/L (ref 15–41)
Albumin: 3.9 g/dL (ref 3.5–5.0)
Alkaline Phosphatase: 61 U/L (ref 38–126)
Anion gap: 4 — ABNORMAL LOW (ref 5–15)
BUN: 17 mg/dL (ref 8–23)
CO2: 29 mmol/L (ref 22–32)
Calcium: 8.7 mg/dL — ABNORMAL LOW (ref 8.9–10.3)
Chloride: 104 mmol/L (ref 98–111)
Creatinine: 0.76 mg/dL (ref 0.44–1.00)
GFR, Estimated: >60 mL/min (ref >60)
Glucose, Bld: 107 mg/dL — ABNORMAL HIGH (ref 70–99)
Potassium: 3.7 mmol/L (ref 3.5–5.1)
Sodium: 137 mmol/L (ref 135–145)
Total Bilirubin: 0.5 mg/dL (ref 0.0–1.2)
Total Protein: 6 g/dL — ABNORMAL LOW (ref 6.5–8.1)

## 2024-04-13 MED ORDER — SODIUM CHLORIDE 0.9% FLUSH
10.0000 mL | Freq: Once | INTRAVENOUS | Status: AC
Start: 2024-04-13 — End: 2024-04-13
  Administered 2024-04-13: 10 mL

## 2024-04-14 LAB — CANCER ANTIGEN 19-9: CA 19-9: 77 U/mL — ABNORMAL HIGH (ref 0–35)

## 2024-05-25 ENCOUNTER — Inpatient Hospital Stay: Attending: Internal Medicine

## 2024-05-25 ENCOUNTER — Encounter (HOSPITAL_COMMUNITY): Payer: Self-pay

## 2024-05-25 ENCOUNTER — Ambulatory Visit (HOSPITAL_COMMUNITY)
Admission: RE | Admit: 2024-05-25 | Discharge: 2024-05-25 | Disposition: A | Discharge status: Home / Self Care | Source: Ambulatory Visit | Attending: Hematology | Admitting: Hematology

## 2024-05-25 DIAGNOSIS — Z9221 Personal history of antineoplastic chemotherapy: Secondary | ICD-10-CM | POA: Insufficient documentation

## 2024-05-25 DIAGNOSIS — Z923 Personal history of irradiation: Secondary | ICD-10-CM | POA: Diagnosis not present

## 2024-05-25 DIAGNOSIS — R978 Other abnormal tumor markers: Secondary | ICD-10-CM | POA: Diagnosis not present

## 2024-05-25 DIAGNOSIS — R530 Neoplastic (malignant) related fatigue: Secondary | ICD-10-CM | POA: Diagnosis not present

## 2024-05-25 DIAGNOSIS — C250 Malignant neoplasm of head of pancreas: Secondary | ICD-10-CM

## 2024-05-25 DIAGNOSIS — C257 Malignant neoplasm of other parts of pancreas: Secondary | ICD-10-CM | POA: Insufficient documentation

## 2024-05-25 DIAGNOSIS — C3492 Malignant neoplasm of unspecified part of left bronchus or lung: Secondary | ICD-10-CM

## 2024-05-25 DIAGNOSIS — Z85118 Personal history of other malignant neoplasm of bronchus and lung: Secondary | ICD-10-CM | POA: Diagnosis not present

## 2024-05-25 DIAGNOSIS — C3490 Malignant neoplasm of unspecified part of unspecified bronchus or lung: Secondary | ICD-10-CM

## 2024-05-25 LAB — CBC WITH DIFFERENTIAL (CANCER CENTER ONLY)
Abs Immature Granulocytes: 0.01 K/uL (ref 0.00–0.07)
Basophils Absolute: 0 K/uL (ref 0.0–0.1)
Basophils Relative: 0 %
Eosinophils Absolute: 0 K/uL (ref 0.0–0.5)
Eosinophils Relative: 1 %
HCT: 35.5 % — ABNORMAL LOW (ref 36.0–46.0)
Hemoglobin: 12.5 g/dL (ref 12.0–15.0)
Immature Granulocytes: 0 %
Lymphocytes Relative: 13 %
Lymphs Abs: 0.8 K/uL (ref 0.7–4.0)
MCH: 34.7 pg — ABNORMAL HIGH (ref 26.0–34.0)
MCHC: 35.2 g/dL (ref 30.0–36.0)
MCV: 98.6 fL (ref 80.0–100.0)
Monocytes Absolute: 0.3 K/uL (ref 0.1–1.0)
Monocytes Relative: 5 %
Neutro Abs: 4.9 K/uL (ref 1.7–7.7)
Neutrophils Relative %: 81 %
Platelet Count: 168 K/uL (ref 150–400)
RBC: 3.6 MIL/uL — ABNORMAL LOW (ref 3.87–5.11)
RDW: 11.7 % (ref 11.5–15.5)
WBC Count: 6.2 K/uL (ref 4.0–10.5)
nRBC: 0 % (ref 0.0–0.2)

## 2024-05-25 LAB — CMP (CANCER CENTER ONLY)
ALT: 9 U/L (ref 0–44)
AST: 18 U/L (ref 15–41)
Albumin: 4 g/dL (ref 3.5–5.0)
Alkaline Phosphatase: 76 U/L (ref 38–126)
Anion gap: 5 (ref 5–15)
BUN: 19 mg/dL (ref 8–23)
CO2: 28 mmol/L (ref 22–32)
Calcium: 9.1 mg/dL (ref 8.9–10.3)
Chloride: 102 mmol/L (ref 98–111)
Creatinine: 0.75 mg/dL (ref 0.44–1.00)
GFR, Estimated: >60 mL/min (ref >60)
Glucose, Bld: 147 mg/dL — ABNORMAL HIGH (ref 70–99)
Potassium: 3.7 mmol/L (ref 3.5–5.1)
Sodium: 135 mmol/L (ref 135–145)
Total Bilirubin: 0.6 mg/dL (ref 0.0–1.2)
Total Protein: 6.2 g/dL — ABNORMAL LOW (ref 6.5–8.1)

## 2024-05-25 MED ORDER — HEPARIN SOD (PORK) LOCK FLUSH 100 UNIT/ML IV SOLN
INTRAVENOUS | Status: AC
  Filled 2024-05-25: qty 5

## 2024-05-25 MED ORDER — HEPARIN SOD (PORK) LOCK FLUSH 100 UNIT/ML IV SOLN
500.0000 [IU] | Freq: Once | INTRAVENOUS | Status: AC
  Administered 2024-05-25: 500 [IU] via INTRAVENOUS

## 2024-05-25 MED ORDER — IOHEXOL 300 MG/ML  SOLN
100.0000 mL | Freq: Once | INTRAMUSCULAR | Status: AC | PRN
  Administered 2024-05-25: 100 mL via INTRAVENOUS

## 2024-05-26 LAB — CANCER ANTIGEN 19-9: CA 19-9: 315 U/mL — ABNORMAL HIGH (ref 0–35)

## 2024-05-27 NOTE — Progress Notes (Signed)
 Spoke w/[name] in the Reading Room at [facility] regarding expediting the read on pt's recent CT Scan.  Luke stated she would expedite the read.

## 2024-06-01 NOTE — Assessment & Plan Note (Signed)
 cT3N0M0 -Incidental findings on the CT scan for lung cancer screening/follow-up -Diagnosed in September 2024 by EUS baseline CA 19.9 256 -Patient has synchronized 2 hypermetabolic lesion in her lungs, highly suspicious for malignancy. -Patient declined Whipple surgery due to her advanced age and diagnosis of lung cancer at the same time. -Patient agreed with low intensity chemotherapy with single agent gemcitabine  and started on October 8, 2024and completed in Jan 2025, followed by consolidation radiation SBRT which she completed in March 2025 -restaging CT 02/27/24 showed improved pancreatic mass, and a small indeterminate liver lesion, no definitive evidence of metastasis.

## 2024-06-02 ENCOUNTER — Other Ambulatory Visit: Payer: Self-pay

## 2024-06-02 ENCOUNTER — Inpatient Hospital Stay (HOSPITAL_BASED_OUTPATIENT_CLINIC_OR_DEPARTMENT_OTHER): Admitting: Hematology

## 2024-06-02 VITALS — BP 118/72 | HR 74 | Temp 97.8°F | Resp 16 | Ht 62.5 in | Wt 129.7 lb

## 2024-06-02 DIAGNOSIS — C250 Malignant neoplasm of head of pancreas: Secondary | ICD-10-CM | POA: Diagnosis not present

## 2024-06-02 DIAGNOSIS — C257 Malignant neoplasm of other parts of pancreas: Secondary | ICD-10-CM | POA: Diagnosis not present

## 2024-06-02 NOTE — Progress Notes (Signed)
 [facility]   [phone] Fax:([phone]   Clinic Follow up Note   Patient Care Team: Beam, Lamar POUR, MD as PCP - General (Family Medicine) Dann Candyce RAMAN, MD as PCP - Cardiology (Cardiology) Kerrin Elspeth BROCKS, MD as Consulting Physician (Cardiothoracic Surgery) Lanny Callander, MD as Consulting Physician (Oncology) Ann Mayme POUR, NP as Nurse Practitioner (Nurse Practitioner) Shannon Agent, MD as Consulting Physician (Radiation Oncology)  Date of Service:  06/02/2024  CHIEF COMPLAINT: f/u of pancreatic cancer  CURRENT THERAPY:  Surveillance  Oncology History   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 -Patient declined Whipple surgery due to her advanced age and diagnosis of lung cancer at the same time. -Patient agreed with low intensity chemotherapy with single agent gemcitabine  and started on October 8, 2024and completed in Jan 2025, followed by consolidation radiation SBRT which she completed in March 2025 -restaging CT 02/27/24 showed improved pancreatic mass, and a small indeterminate liver lesion, no definitive evidence of metastasis.  Assessment & Plan Pancreatic cancer with new lesion in pancreas 83 year old female with pancreatic cancer initially in the head of the pancreas, now presenting with a new cancerous lesion in the neck area. Tumor marker levels increased from 77 to 300, correlating with the new lesion. The initial cancer site is slightly reduced in size. No liver involvement, and normal blood counts, kidney, and liver functions. The treatment plan prioritizes quality of life over aggressive measures due to her age and the non-curable nature of the disease. Radiation therapy is preferred over  chemotherapy due to previous significant fatigue from chemotherapy. Radiation may cause stomach irritation, nausea, diarrhea, or abdominal pain, but these side effects are usually not permanent. There is a possibility of microscopic disease elsewhere, particularly in the liver, which radiation will not address. - Coordinate with Dr. Brigid for radiation therapy review and potential initiation in a couple of weeks. - Monitor for side effects during and after radiation therapy. - Schedule follow-up scan in three months. - Schedule port flush in six weeks, coinciding with post-radiation follow-up. - Order lab tests and scan for next follow-up visit. - Instruct to contact Dr. Shannon or the oncology team if any issues arise during or after radiation therapy.  Plan - Case was reviewed in GI tumor board this morning, and I reach out to her radiation oncologist Dr. Brigid. RT can be offered to the new lesion in pancrease, will proceed  - Continue observation after radiation - Lab and follow-up in 6 weeks.  Plan to repeat a scan in 3 months if she is clinically stable. - She is open to more chemotherapy if she has further disease progression after radiation.   SUMMARY OF ONCOLOGIC HISTORY: Oncology History  Adenocarcinoma of left lung, stage 1 (HCC)  07/06/2018 Initial Diagnosis   Adenocarcinoma of left lung, stage 1 (HCC)   02/15/2021 Cancer Staging   Staging form: Lung, AJCC 8th Edition - Clinical: Stage IA3 (cT1c, cN0, cM0) - Signed by Sherrod Sherrod, MD on 02/15/2021   Adenocarcinoma of head of pancreas (HCC)  03/03/2023 Imaging   CT chest IMPRESSION: 1. Enlarging and concerning right lung nodules, the largest in the right middle lobe measuring up to 1.4 cm in diameter, highly suspicious for primary bronchogenic carcinoma. The right middle lobe nodule is large enough to evaluate  by PET-CT which is recommended. 2. No evidence of thoracic adenopathy or pleural effusion. No suspicious left lung  nodule status post upper lobe resection. 3. Aortic Atherosclerosis (ICD10-I70.0) and Emphysema (ICD10-J43.9).   03/28/2023 PET scan   IMPRESSION: 1. 12 mm medial right middle lobe pulmonary nodule is hypermetabolic and consistent with primary neoplasm. 2. 7 mm right lower lobe nodule and 7 mm right upper lobe nodule do not demonstrate any hypermetabolism but will need close surveillance. 3. No mediastinal or hilar lymphadenopathy. 4. No findings for osseous metastatic disease. 5. Abnormal appearance of the pancreatic head and mild hypermetabolism worrisome for neoplasm. Recommend MRI abdomen without and with contrast for further evaluation. Aortic Atherosclerosis (ICD10-I70.0).   05/04/2023 Imaging   ABD MRI IMPRESSION: 1. Suspicious, solid lesion within the head of pancreas 3.0 x 2.4 x 2.4 cm. There is associated main duct dilatation within the head through tail of pancreas. Suspected lesion corresponds to the area of increased tracer uptake noted on the recent PET-CT. Findings are concerning for pancreatic adenocarcinoma. Motion artifact on the postcontrast images diminishes exam detail within the pancreas. Difficult to exclude involvement of the portal vein. The celiac trunk and proximal SMA appear uninvolved. Consider further evaluation with endoscopic ultrasound and possibly CTA of the abdomen. 2. Scattered T2 hyperintense and T1 hypointense foci within the liver are favored to represent multiple cysts. Unfortunately, motion artifact diminishes exam detail within the liver for the evaluation underlying enhancing liver lesions. Within this limitation, no obvious suspicious lesion identified at this time. 3. Right middle lobe lung nodule is again identified as seen on the previous PET-CT.   05/04/2023 Imaging   Brain MRI IMPRESSION: No evidence of intracranial metastatic disease.   05/16/2023 Tumor Marker   CA 19-9: 256   05/22/2023 Cancer Staging   Staging form: Exocrine  Pancreas, AJCC 8th Edition - Clinical stage from 05/22/2023: Stage IB (cT2, cN0, cM0) - Signed by Burton, Lacie K, NP on 06/09/2023 Stage prefix: Initial diagnosis Total positive nodes: 0   05/22/2023 Procedure   EUS by Dr. Wilhelmenia: 29 x 28 mm mass in the pancreatic head appearing suspicious for adeno, no malignant appearing lymph nodes were visualized.  Abuts the gastroduodenal artery but not involved with the SMA or celiac trunk.  T2 N0 by EUS    05/22/2023 Initial Biopsy    A. PANCREAS, HEAD LESION, FINE NEEDLE ASPIRATION:  FINAL MICROSCOPIC DIAGNOSIS:  - Adenocarcinoma    06/09/2023 Initial Diagnosis   Adenocarcinoma of head of pancreas (HCC)   06/17/2023 -  Chemotherapy   Patient is on Treatment Plan : LUNG Gemcitabine  (1000) D1,8 q21d      Genetic Testing   [facility] CancerNext-Expanded Panel+RNA was Negative. Report date is 06/24/2023.   The CancerNext-Expanded gene panel offered by [facility] and includes sequencing, rearrangement, and RNA analysis for the following 71 genes: AIP, ALK, APC, ATM, AXIN2, BAP1, BARD1, BMPR1A, BRCA1, BRCA2, BRIP1, CDC73, CDH1, CDK4, CDKN1B, CDKN2A, CHEK2, CTNNA1, DICER1, FH, FLCN, KIF1B, LZTR1, MAX, MEN1, MET, MLH1, MSH2, MSH3, MSH6, MUTYH, NF1, NF2, NTHL1, PALB2, PHOX2B, PMS2, POT1, PRKAR1A, PTCH1, PTEN, RAD51C, RAD51D, RB1, RET, SDHA, SDHAF2, SDHB, SDHC, SDHD, SMAD4, SMARCA4, SMARCB1, SMARCE1, STK11, SUFU, TMEM127, TP53, TSC1, TSC2, and VHL (sequencing and deletion/duplication); EGFR, EGLN1, HOXB13, KIT, MITF, PDGFRA, POLD1, and POLE (sequencing only); EPCAM and GREM1 (deletion/duplication only).    09/08/2023 Imaging   CT abdomen and pelvis with contrast  IMPRESSION: 1. Given cross modality comparison to the MRI of 05/04/2023, similar to minimal decrease in size  of the pancreatic head mass. No vascular involvement or metastatic disease identified. 2. Apparent gastric body wall thickening could be due to underdistention. Correlate with symptoms of  gastritis. 3. Incidental findings, including: Aortic atherosclerosis (ICD10-I70.0) and emphysema (ICD10-J43.9). Small hiatal hernia.      Discussed the use of AI scribe software for clinical note transcription with the patient, who gave verbal consent to proceed.  History of Present Illness [name] is an 83 year old female with pancreatic cancer who presents for follow-up.  A recent scan shows a new spot in the pancreas, possibly in a nearby lymph node, adjacent to the previously treated site in the head of the pancreas. Her initial pancreatic cancer site appears stable and slightly smaller following chemotherapy and radiation. Her tumor marker has increased from 77 to 300.  She has undergone chemotherapy and radiation, which caused significant fatigue but no nausea. Radiation-induced tiredness was less severe. Her weight is stable, and she maintains decent energy levels. Blood counts, kidney, and liver functions are normal.  She has a port, flushed last week, with another flush scheduled in six weeks. A fiducia was placed during her last treatment, with its future use uncertain.     All other systems were reviewed with the patient and are negative.  MEDICAL HISTORY:  Past Medical History:  Diagnosis Date   Anxiety    Blood glucose elevated 05/27/2012   pt. 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   Overview:  Dr Rollin  Last Assessment & Plan:  Relevant Hx: Course: Daily Update: Today's Plan:    GERD (gastroesophageal reflux disease)    Hemorrhoid 12/06/2015   History of hiatal hernia    History of radiation therapy    Right Lung- 06/30/23-07/14/23- Dr. Lynwood Nasuti   History of radiation therapy    Pancreas- 11/19/23-11/28/23- Dr. Lynwood Nasuti   HLD (hyperlipidemia) 12/03/2011   Hypertension    Lung cancer (HCC) dx'd 04/2018   with recurrence 02/2023   Osteopenia 05/30/2015   Overview:  Bone density 05/2015 started fosamax    Pancreatic cancer (HCC) 02/2023   Primary localized osteoarthritis of left knee    Primary localized osteoarthritis of right knee 12/06/2015    SURGICAL HISTORY: Past Surgical History:  Procedure Laterality Date   ABDOMINAL HYSTERECTOMY     BIOPSY  05/22/2023   Procedure: BIOPSY;  Surgeon: Wilhelmenia Aloha Raddle., MD;  Location: [facility];  Service: Gastroenterology;;   CATARACT EXTRACTION, BILATERAL     COLONOSCOPY     ESOPHAGOGASTRODUODENOSCOPY (EGD) WITH PROPOFOL  N/A 05/22/2023   Procedure: ESOPHAGOGASTRODUODENOSCOPY (EGD) WITH PROPOFOL ;  Surgeon: Wilhelmenia Aloha Raddle., MD;  Location: [facility];  Service: Gastroenterology;  Laterality: N/A;   ESOPHAGOGASTRODUODENOSCOPY (EGD) WITH PROPOFOL  N/A 11/06/2023   Procedure: ESOPHAGOGASTRODUODENOSCOPY (EGD) WITH PROPOFOL ;  Surgeon: Wilhelmenia Aloha Raddle., MD;  Location: [facility];  Service: Gastroenterology;  Laterality: N/A;   EUS N/A 05/22/2023   Procedure: UPPER ENDOSCOPIC ULTRASOUND (EUS) RADIAL;  Surgeon: Wilhelmenia Aloha Raddle., MD;  Location: [facility];  Service: Gastroenterology;  Laterality: N/A;   EUS N/A 11/06/2023   Procedure: UPPER ENDOSCOPIC ULTRASOUND (EUS) RADIAL;  Surgeon: Wilhelmenia Aloha Raddle., MD;  Location:  [facility];  Service: Gastroenterology;  Laterality: N/A;   EYE SURGERY Bilateral    Cataract   FIDUCIAL MARKER PLACEMENT N/A 11/06/2023   Procedure: FIDUCIAL MARKER PLACEMENT;  Surgeon: Wilhelmenia Aloha Raddle., MD;  Location: [facility];  Service: Gastroenterology;  Laterality: N/A;   FINE NEEDLE ASPIRATION  05/22/2023   Procedure: FINE NEEDLE ASPIRATION;  Surgeon: Wilhelmenia Aloha Raddle., MD;  Location: [facility];  Service:  Gastroenterology;;   implantable loop recorder placement  10/26/2018   MDT LINQ Reveal XT ILR implanted for evaluation of palpitations and atrial fibrillation in office by Dr Kelsie, Device SN [id number])    IR IMAGING GUIDED PORT INSERTION  06/24/2023   KNEE SURGERY  05/01/2016   left uncompartmental    PARTIAL KNEE ARTHROPLASTY Right 12/18/2015   Procedure: UNICOMPARTMENTAL RIGHT KNEE;  Surgeon: Lamar Millman, MD;  Location: [facility] OR;  Service: Orthopedics;  Laterality: Right;   PARTIAL KNEE ARTHROPLASTY Left 05/01/2016   Procedure: UNICOMPARTMENTAL KNEE;  Surgeon: Lamar Millman, MD;  Location: [facility] OR;  Service: Orthopedics;  Laterality: Left;   POLYPECTOMY  05/22/2023   Procedure: POLYPECTOMY;  Surgeon: Mansouraty, Aloha Raddle., MD;  Location: [facility];  Service: Gastroenterology;;   TOTAL ABDOMINAL HYSTERECTOMY W/ BILATERAL SALPINGOOPHORECTOMY     VIDEO ASSISTED THORACOSCOPY (VATS)/ LOBECTOMY Left 05/13/2018   Procedure: VIDEO ASSISTED THORACOSCOPY (VATS)/LEFT UPPER LOBECTOMY;  Surgeon: Kerrin Elspeth BROCKS, MD;  Location: [facility] OR;  Service: Thoracic;  Laterality: Left;    I have reviewed the social history and family history with the patient and they are unchanged from previous note.  ALLERGIES:  is allergic to ambien  [zolpidem  tartrate], sulfa antibiotics, codeine, hydrocodone , and meperidine.  MEDICATIONS:  Current Outpatient Medications  Medication Sig Dispense Refill   albuterol  (VENTOLIN  HFA) 108 (90 Base) MCG/ACT inhaler Inhale 1-2 puffs into the lungs 3 (three) times daily as needed for wheezing or shortness of breath. 1 each 0   Calcium  Carb-Cholecalciferol (CALCIUM -VITAMIN D3) 600-200 MG-UNIT TABS Take by mouth daily.     cetirizine (ZYRTEC) 10 MG tablet Take 10 mg by mouth as needed for allergies.     diphenhydramine -acetaminophen  (TYLENOL  PM) 25-500 MG TABS tablet Take 2 tablets by mouth at bedtime as needed (for sleep).     escitalopram (LEXAPRO) 20 MG tablet Take by mouth  daily.     fluticasone  (FLONASE ) 50 MCG/ACT nasal spray PLACE 1-2 SPRAYS INTO BOTH NOSTRILS DAILY. 48 mL 1   lidocaine -prilocaine  (EMLA ) cream Apply to affected area once 30 g 3   metoprolol  succinate (TOPROL -XL) 50 MG 24 hr tablet Take 50 mg by mouth daily. Take with or immediately following a meal.     Multiple Vitamins-Minerals (MULTIVITAMIN PO) Take 1 tablet by mouth daily.     ondansetron  (ZOFRAN ) 8 MG tablet Take 1 tablet (8 mg total) by mouth every 8 (eight) hours as needed for nausea or vomiting. 30 tablet 1   pantoprazole (PROTONIX) 40 MG tablet Take 40 mg by mouth daily.     polyethylene glycol (MIRALAX  / GLYCOLAX ) packet Take 8.5 g by mouth every other day.     prochlorperazine  (COMPAZINE ) 10 MG tablet Take 1 tablet (10 mg total) by mouth every 6 (six) hours as needed for nausea or vomiting. 30 tablet 1   simvastatin  (ZOCOR ) 20 MG tablet Take 20 mg by mouth daily.     triamcinolone  ointment (KENALOG ) 0.5 % Apply 1 Application topically 2 (two) times daily. 30 g 0   No current facility-administered medications for this visit.    PHYSICAL EXAMINATION:  ECOG PERFORMANCE STATUS: 1 - Symptomatic but completely ambulatory  Vitals:   06/02/24 0906  BP: 118/72  Pulse: 74  Resp: 16  Temp: 97.8 F (36.6 C)  SpO2: 98%   Wt Readings from Last 3 Encounters:  06/02/24 129 lb 11.2 oz (58.8 kg)  03/02/24 132 lb (59.9 kg)  12/31/23 131 lb 12.8 oz (59.8 kg)     GENERAL:alert, no distress and comfortable SKIN: skin color, texture, turgor are normal, no rashes or significant lesions EYES: normal, Conjunctiva are pink and non-injected, sclera clear NECK: supple, thyroid normal size, non-tender, without nodularity LYMPH:  no palpable lymphadenopathy in the cervical, axillary  LUNGS: clear to auscultation and percussion with normal breathing effort HEART: regular rate & rhythm and no murmurs and no lower extremity edema ABDOMEN:abdomen soft, non-tender and normal bowel  sounds Musculoskeletal:no cyanosis of digits and no clubbing  NEURO: alert & oriented x 3 with fluent speech, no focal motor/sensory deficits  Physical Exam   LABORATORY DATA:  I have reviewed the data as listed    Latest Ref Rng & Units 05/25/2024   12:46 PM 04/13/2024   10:11 AM 02/27/2024   10:03 AM  CBC  WBC 4.0 - 10.5 K/uL 6.2  5.0  6.6   Hemoglobin 12.0 - 15.0 g/dL 87.4  87.8  87.8   Hematocrit 36.0 - 46.0 % 35.5  34.8  34.6   Platelets 150 - 400 K/uL 168  141  157         Latest Ref Rng & Units 05/25/2024   12:46 PM 04/13/2024   10:11 AM 02/27/2024   10:03 AM  CMP  Glucose 70 - 99 mg/dL 852  892  886   BUN 8 - 23 mg/dL 19  17  13    Creatinine 0.44 - 1.00 mg/dL 9.24  9.23  9.23   Sodium 135 - 145 mmol/L 135  137  136   Potassium 3.5 - 5.1 mmol/L 3.7  3.7  3.9   Chloride 98 - 111 mmol/L 102  104  103   CO2 22 - 32 mmol/L 28  29  28    Calcium  8.9 - 10.3 mg/dL 9.1  8.7  9.6   Total Protein 6.5 - 8.1 g/dL 6.2  6.0  6.2   Total Bilirubin 0.0 - 1.2 mg/dL 0.6  0.5  0.7   Alkaline Phos 38 - 126 U/L 76  61  57   AST 15 - 41 U/L 18  25  19    ALT 0 - 44 U/L 9  13  10        RADIOGRAPHIC STUDIES: I have personally reviewed the radiological images as listed and agreed with the findings in the report. No results found.    No orders of the defined types were placed in this encounter.  All questions were answered. The patient knows to call the clinic with any problems, questions or concerns. No barriers to learning was detected. The total time spent in the appointment was 40 minutes, including review of chart and various tests results, discussions about plan of care and coordination of care plan     Onita Mattock, MD 06/02/2024

## 2024-06-02 NOTE — Progress Notes (Signed)
 The proposed treatment discussed in conference is for discussion purpose only and is not a binding recommendation.  The patients have not been physically examined, or presented with their treatment options.  Therefore, final treatment plans cannot be decided.

## 2024-06-03 ENCOUNTER — Other Ambulatory Visit: Payer: Self-pay

## 2024-06-08 ENCOUNTER — Other Ambulatory Visit: Payer: Self-pay

## 2024-06-08 ENCOUNTER — Telehealth: Payer: Self-pay

## 2024-06-08 NOTE — Telephone Encounter (Addendum)
 Pt called stating that Dr. Lanny was supposed to speak with Dr. Shannon regarding starting radiation.  Pt stated Dr. Lanny said if the pt has not heard from Dr. Colton office to please f/u with Dr. Demetra office.  Pt called today stating she has not heard from Dr. Colton office.  Stated this nurse will make Dr. Lanny aware of the pt's call.

## 2024-06-09 ENCOUNTER — Telehealth: Payer: Self-pay | Admitting: Radiation Oncology

## 2024-06-09 NOTE — Progress Notes (Signed)
 GI Location of Tumor / Histology:   Adenocarcinoma of head of pancreas (HCC)  C25.0        Past/Anticipated interventions by surgeon, if any: {:18581}  Past/Anticipated interventions by medical oncology, if any: yes   Weight changes, if any: {:18581}  Bowel/Bladder complaints, if any: {:18581}  Nausea / Vomiting, if any: {:18581}  Pain issues, if any:  {:18581}  Any blood per rectum:   {:18581}  SAFETY ISSUES: Prior radiation? yes Pacemaker/ICD? {:18581} Possible current pregnancy? {:18581} Is the patient on methotrexate? {:18581}  Current Complaints/Details:

## 2024-06-09 NOTE — Telephone Encounter (Signed)
 10/1 @ 10:39 am called patient, no answer/voicemail not set up, also spoke to patient's son, may not be available at this time, try patient again in about 30 mins.

## 2024-06-10 ENCOUNTER — Encounter: Payer: Self-pay | Admitting: Radiation Oncology

## 2024-06-10 ENCOUNTER — Ambulatory Visit
Admission: RE | Admit: 2024-06-10 | Discharge: 2024-06-10 | Disposition: A | Discharge status: Home / Self Care | Source: Ambulatory Visit | Attending: Radiation Oncology | Admitting: Radiation Oncology

## 2024-06-10 VITALS — BP 132/77 | HR 72 | Temp 97.6°F | Resp 20 | Ht 62.5 in | Wt 130.0 lb

## 2024-06-10 DIAGNOSIS — Z9221 Personal history of antineoplastic chemotherapy: Secondary | ICD-10-CM | POA: Insufficient documentation

## 2024-06-10 DIAGNOSIS — I459 Conduction disorder, unspecified: Secondary | ICD-10-CM | POA: Diagnosis not present

## 2024-06-10 DIAGNOSIS — Z923 Personal history of irradiation: Secondary | ICD-10-CM | POA: Insufficient documentation

## 2024-06-10 DIAGNOSIS — C250 Malignant neoplasm of head of pancreas: Secondary | ICD-10-CM | POA: Diagnosis present

## 2024-06-10 DIAGNOSIS — K219 Gastro-esophageal reflux disease without esophagitis: Secondary | ICD-10-CM | POA: Insufficient documentation

## 2024-06-10 DIAGNOSIS — Z803 Family history of malignant neoplasm of breast: Secondary | ICD-10-CM | POA: Diagnosis not present

## 2024-06-10 DIAGNOSIS — I4891 Unspecified atrial fibrillation: Secondary | ICD-10-CM | POA: Insufficient documentation

## 2024-06-10 DIAGNOSIS — Z79899 Other long term (current) drug therapy: Secondary | ICD-10-CM | POA: Insufficient documentation

## 2024-06-10 DIAGNOSIS — K769 Liver disease, unspecified: Secondary | ICD-10-CM | POA: Diagnosis not present

## 2024-06-10 DIAGNOSIS — R918 Other nonspecific abnormal finding of lung field: Secondary | ICD-10-CM | POA: Insufficient documentation

## 2024-06-10 DIAGNOSIS — R978 Other abnormal tumor markers: Secondary | ICD-10-CM | POA: Diagnosis not present

## 2024-06-10 DIAGNOSIS — I700 Atherosclerosis of aorta: Secondary | ICD-10-CM | POA: Insufficient documentation

## 2024-06-10 DIAGNOSIS — Z87891 Personal history of nicotine dependence: Secondary | ICD-10-CM | POA: Diagnosis not present

## 2024-06-10 DIAGNOSIS — I10 Essential (primary) hypertension: Secondary | ICD-10-CM | POA: Insufficient documentation

## 2024-06-10 DIAGNOSIS — N281 Cyst of kidney, acquired: Secondary | ICD-10-CM | POA: Insufficient documentation

## 2024-06-10 DIAGNOSIS — M8580 Other specified disorders of bone density and structure, unspecified site: Secondary | ICD-10-CM | POA: Insufficient documentation

## 2024-06-10 NOTE — Progress Notes (Signed)
 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  HISTORY OF PRESENT ILLNESS::[name] is a 83 y.o. female who is seen at the request of Dr. Lanny for a new pancreatic lesion.  She is well-known to our clinic as we have treated her for her lung  and pancreatic cancer.  She was last seen in our clinic on 12/29/2023 for routine follow-up after her previous radiation to the lung and pancreas.  Patient tolerated the radiation without any significant side effects and reported to be doing well overall at that time  She was originally diagnosed with pancreatic cancer in September 2024 after a pancreatic mass that was incidentally found on CT scan for lung cancer screening/follow-up. She was treated with low intensity chemotherapy and completed this in January 2025.  This was followed by consolidative radiation SBRT completed in March 2025.  Restaging CT on 02/27/2024 demonstrated the pancreatic mass to be improved, and a small determinate liver lesion, no definitive evidence of metastasis was noted.  Most recently, she underwent CT of the chest, abdomen, and pelvis on 05/25/2024 which demonstrated a new 2.3 cm enhancing lesion at the pancreas; a previously treated 2.0 cm pancreatic head mass; stable pulmonary nodules; stable small periportal and celiac axis lymph nodes; stable scattered hepatic lesions , consistent with benign cysts; and stable emphasis, test changes.  Her tumor marker levels had also increased from 77 to 300, correlating with a new lesion.  Her case was discussed at the GI tumor board, and the consensus was that th these findings were consistent with a new malignant lesion.  She met with Dr. Lanny  on 06/02/2024 to discuss her treatment options.  Due to her age and the noncurable nature of her disease, Dr. Lanny recommended a treatment plan prioritizing quality of life over aggressive measures.  She did not believe the patient would be a good candidate for chemotherapy due to previous significant fatigue.  She was kindly referred to us  today to discuss radiation treatment options.  Patient reports to be doing well overall today.  She denies any abdominal pain, nausea, constipation, or any changes to her bowel habits.  PREVIOUS RADIATION THERAPY: Yes   2) Radiation Treatment Dates: First Treatment Date: 2023-11-19 -- Last Treatment Date: 2023-11-28 Intent: Curative  Site/Dose/Technique/Mode:  Plan Name: Pancreas_SBRT Site: Pancreas Technique: SBRT/SRT-IMRT Mode: Photon Dose Per Fraction: 6.6 Gy Prescribed Dose (Delivered / Prescribed): 33 Gy / 33 Gy Prescribed Fxs (Delivered / Prescribed): 5 / 5   1) Indication for treatment: Curative       Radiation treatment dates: First Treatment Date: 2023-06-30 - Last Treatment Date: 2023-07-14 Site/Dose/Technique/Mode:  Site: Right lower lung  Technique: SBRT/SRT-IMRT Mode: Photon Dose Per Fraction: 18 Gy Prescribed Dose (Delivered / Prescribed): 54 Gy / 54 Gy Prescribed Fxs (Delivered / Prescribed): 3 / 3   Site: Right upper lung  Technique: SBRT/SRT-IMRT Mode: Photon Dose Per Fraction: 18 Gy Prescribed Dose (Delivered / Prescribed): 54 Gy / 54 Gy Prescribed Fxs (Delivered / Prescribed): 3 / 3   Site: Right middle lung Technique: IMRT Mode: Photon Dose Per Fraction: 5 Gy Prescribed Dose (Delivered / Prescribed): 50 Gy / 50 Gy Prescribed Fxs (Delivered / Prescribed): 10 / 10  PAST MEDICAL HISTORY:  has a past medical history of Anxiety, Blood glucose elevated (05/27/2012), BP (high blood pressure) (12/03/2011), Cancer (HCC), Cardiac conduction disorder (12/06/2015), CN (constipation) (12/06/2015), 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    PAST SURGICAL HISTORY: Past Surgical History:  Procedure Laterality Date   ABDOMINAL HYSTERECTOMY     BIOPSY  05/22/2023   Procedure: BIOPSY;  Surgeon: Wilhelmenia Aloha Raddle., MD;  Location: [facility];  Service: Gastroenterology;;   CATARACT EXTRACTION, BILATERAL     COLONOSCOPY     ESOPHAGOGASTRODUODENOSCOPY (EGD) WITH PROPOFOL  N/A 05/22/2023   Procedure: ESOPHAGOGASTRODUODENOSCOPY (EGD) WITH PROPOFOL ;  Surgeon: Wilhelmenia Aloha Raddle., MD;  Location: [facility];  Service: Gastroenterology;  Laterality: N/A;   ESOPHAGOGASTRODUODENOSCOPY (EGD) WITH PROPOFOL  N/A 11/06/2023   Procedure: ESOPHAGOGASTRODUODENOSCOPY (EGD) WITH PROPOFOL ;  Surgeon: Wilhelmenia Aloha Raddle., MD;  Location: [facility];  Service: Gastroenterology;  Laterality: N/A;   EUS N/A 05/22/2023   Procedure: UPPER ENDOSCOPIC ULTRASOUND (EUS) RADIAL;  Surgeon: Wilhelmenia Aloha Raddle., MD;  Location: [facility];  Service: Gastroenterology;  Laterality: N/A;   EUS N/A 11/06/2023   Procedure: UPPER ENDOSCOPIC ULTRASOUND (EUS) RADIAL;  Surgeon: Wilhelmenia Aloha Raddle., MD;  Location: [facility];  Service: Gastroenterology;  Laterality: N/A;   EYE SURGERY Bilateral    Cataract   FIDUCIAL MARKER PLACEMENT N/A 11/06/2023   Procedure: FIDUCIAL MARKER PLACEMENT;  Surgeon: Wilhelmenia Aloha Raddle., MD;  Location: [facility];  Service: Gastroenterology;  Laterality: N/A;   FINE NEEDLE ASPIRATION  05/22/2023   Procedure: FINE NEEDLE ASPIRATION;  Surgeon: Wilhelmenia Aloha Raddle., MD;  Location: [facility];  Service:  Gastroenterology;;   implantable loop recorder placement  10/26/2018   MDT LINQ Reveal XT ILR implanted for evaluation of palpitations and atrial fibrillation in office by Dr Kelsie, Device SN [id number])    IR IMAGING GUIDED PORT INSERTION  06/24/2023   KNEE SURGERY  05/01/2016   left uncompartmental    PARTIAL KNEE ARTHROPLASTY Right 12/18/2015   Procedure: UNICOMPARTMENTAL RIGHT KNEE;  Surgeon: Lamar Millman, MD;  Location: [facility] OR;  Service: Orthopedics;  Laterality: Right;   PARTIAL KNEE ARTHROPLASTY Left 05/01/2016   Procedure: UNICOMPARTMENTAL KNEE;  Surgeon: Lamar Millman, MD;  Location[facility];  Service: Orthopedics;  Laterality: Left;   POLYPECTOMY  05/22/2023   Procedure: POLYPECTOMY;  Surgeon: Mansouraty, Aloha Raddle., MD;  Location: [facility];  Service: Gastroenterology;;   TOTAL ABDOMINAL HYSTERECTOMY W/ BILATERAL SALPINGOOPHORECTOMY     VIDEO ASSISTED THORACOSCOPY (VATS)/ LOBECTOMY Left 05/13/2018   Procedure: VIDEO ASSISTED THORACOSCOPY (VATS)/LEFT UPPER LOBECTOMY;  Surgeon: Kerrin Elspeth BROCKS, MD;  Location: [facility] OR;  Service: Thoracic;  Laterality: Left;    FAMILY HISTORY: family history includes Breast cancer in her niece and sister; Cancer in her father; Congestive Heart Failure in her mother.  SOCIAL HISTORY:  reports that she quit smoking about 5 years ago. Her smoking use included cigarettes. She started smoking about 55 years ago. She has never used smokeless tobacco. She reports current alcohol use of about 1.0 standard drink of alcohol per week. She reports that she does not use drugs.  ALLERGIES: Ambien  [zolpidem  tartrate], Sulfa antibiotics, Codeine, Hydrocodone , and Meperidine  MEDICATIONS:  Current Outpatient Medications  Medication Sig Dispense Refill   albuterol  (VENTOLIN  HFA) 108 (90 Base)  MCG/ACT inhaler Inhale 1-2 puffs into the lungs 3 (three) times daily as needed for wheezing or shortness of breath. 1 each 0   Calcium  Carb-Cholecalciferol (CALCIUM -VITAMIN  D3) 600-200 MG-UNIT TABS Take by mouth daily.     cetirizine (ZYRTEC) 10 MG tablet Take 10 mg by mouth as needed for allergies.     diphenhydramine -acetaminophen  (TYLENOL  PM) 25-500 MG TABS tablet Take 2 tablets by mouth at bedtime as needed (for sleep).     escitalopram (LEXAPRO) 20 MG tablet Take by mouth daily.     fluticasone  (FLONASE ) 50 MCG/ACT nasal spray PLACE 1-2 SPRAYS INTO BOTH NOSTRILS DAILY. 48 mL 1   lidocaine -prilocaine  (EMLA ) cream Apply to affected area once 30 g 3   metoprolol  succinate (TOPROL -XL) 50 MG 24 hr tablet Take 50 mg by mouth daily. Take with or immediately following a meal.     Multiple Vitamins-Minerals (MULTIVITAMIN PO) Take 1 tablet by mouth daily.     ondansetron  (ZOFRAN ) 8 MG tablet Take 1 tablet (8 mg total) by mouth every 8 (eight) hours as needed for nausea or vomiting. 30 tablet 1   pantoprazole (PROTONIX) 40 MG tablet Take 40 mg by mouth daily.     polyethylene glycol (MIRALAX  / GLYCOLAX ) packet Take 8.5 g by mouth every other day.     prochlorperazine  (COMPAZINE ) 10 MG tablet Take 1 tablet (10 mg total) by mouth every 6 (six) hours as needed for nausea or vomiting. 30 tablet 1   simvastatin  (ZOCOR ) 20 MG tablet Take 20 mg by mouth daily.     triamcinolone  ointment (KENALOG ) 0.5 % Apply 1 Application topically 2 (two) times daily. 30 g 0   No current facility-administered medications for this encounter.    REVIEW OF SYSTEMS: Notable for that above   PHYSICAL EXAM:  height is 5' 2.5 (1.588 m) and weight is 130 lb (59 kg). Her temperature is 97.6 F (36.4 C). Her blood pressure is 132/77 and her pulse is 72. Her respiration is 20 and oxygen saturation is 99%.   General: Alert and oriented, in no acute distress HEENT: Head is normocephalic. Extraocular movements are intact.  Neck: Neck is supple, no palpable cervical or supraclavicular lymphadenopathy. Heart: Regular in rate and rhythm with no murmurs, rubs, or gallops. Chest: Clear to auscultation  bilaterally, with no rhonchi, wheezes, or rales. Abdomen: Soft, nontender, nondistended, with no rigidity or guarding.  Normoactive bowel sounds. Extremities: No cyanosis or edema. Lymphatics: see Neck Exam Skin: No concerning lesions. Musculoskeletal: symmetric strength and muscle tone throughout. Neurologic: Cranial nerves II through XII are grossly intact. No obvious focalities. Speech is fluent. Coordination is intact. Psychiatric: Judgment and insight are intact. Affect is appropriate.    ECOG = 1  0 - Asymptomatic (Fully active, able to carry on all predisease activities without restriction)  1 - Symptomatic but completely ambulatory (Restricted in physically strenuous activity but ambulatory and able to carry out work of a light or sedentary nature. For example, light housework, office work)  2 - Symptomatic, <50% in bed during the day (Ambulatory and capable of all self care but unable to carry out any work activities. Up and about more than 50% of waking hours)  3 - Symptomatic, >50% in bed, but not bedbound (Capable of only limited self-care, confined to bed or chair 50% or more of waking hours)  4 - Bedbound (Completely disabled. Cannot carry on any self-care. Totally confined to bed or chair)  5 - Death   [name],  [place], et al. [phone]). Toxicity and response criteria of the [facility]. Am. DOROTHA Bridges. Oncol. 5 (6): 649-55  LABORATORY DATA:  Lab Results  Component Value Date   WBC 6.2 05/25/2024   HGB 12.5 05/25/2024   HCT 35.5 (L) 05/25/2024   MCV 98.6 05/25/2024   PLT 168 05/25/2024   NEUTROABS 4.9 05/25/2024   Lab Results  Component Value Date   NA 135 05/25/2024   K 3.7 05/25/2024   CL 102 05/25/2024   CO2 28 05/25/2024   GLUCOSE 147 (H) 05/25/2024   BUN 19 05/25/2024   CREATININE 0.75 05/25/2024   CALCIUM  9.1 05/25/2024      RADIOGRAPHY: CT CHEST ABDOMEN PELVIS W CONTRAST Result Date: 05/27/2024 CLINICAL DATA:   Restaging pancreatic cancer and non-small cell lung cancer the heart is normal in size. No pericardial effusion. * Tracking Code: BO * EXAM: CT CHEST, ABDOMEN, AND PELVIS WITH CONTRAST TECHNIQUE: Multidetector CT imaging of the chest, abdomen and pelvis was performed following the standard protocol during bolus administration of intravenous contrast. RADIATION DOSE REDUCTION: This exam was performed according to the departmental dose-optimization program which includes automated exposure control, adjustment of the mA and/or kV according to patient size and/or use of iterative reconstruction technique. CONTRAST:  OMNIPAQUE  IOHEXOL  300 MG/ML  SOLN COMPARISON:  Multiple previous imaging studies. The most recent CT scan is 03/06/2024 FINDINGS: CT CHEST FINDINGS Cardiovascular: The heart is normal in size. No pericardial effusion. Stable mild tortuosity and calcification of the thoracic aorta but no aneurysm or dissection. Stable three-vessel coronary artery calcifications. The right IJ Port-A-Cath is stable. Mediastinum/Nodes: No mediastinal or hilar mass or lymphadenopathy. Small scattered lymph nodes are stable. The esophagus is unremarkable. Small hiatal hernia. Lungs/Pleura: Stable 7 mm nodular density at the right lung apex on image 18/12. Stable 3 mm nodule at the left lung apex on image 19/2. Right upper lobe pulmonary nodule on image 30/12 measures 9 mm and is unchanged. Surrounding interstitial thickening possibly radiation change. No new pulmonary lesions or pulmonary nodules. No acute pulmonary process. No pleural effusions or pleural lesions. Stable underlying emphysematous changes and pulmonary scarring. Musculoskeletal: No significant bony findings. CT ABDOMEN PELVIS FINDINGS Hepatobiliary: Stable scattered low-attenuation hepatic lesions consistent with benign cysts. Stable focus of early arterial phase enhancement near the gallbladder fossa in segment 5, likely a vascular shunt and not present on  delayed images. No worrisome hepatic lesions or intrahepatic biliary dilatation. The gallbladder is unremarkable. No common bile duct dilatation. Pancreas: The pancreatic head mass measures approximately 2.0 x 1.8 cm and appears relatively stable when compared to the most recent prior CT scan. However, there is a new somewhat rim enhancing lesion the head body junction region projecting up into the lesser sac and measuring approximately 2.3 x 2.1 cm on image 98/7. In retrospect on the prior CT scan there was a small enhancing nodule in this area which measured 9 mm. Findings could suggest a second pancreatic tumor or possibly metastatic adenopathy adjacent to the pancreas. Right celiac axis 8 mm node noted on image 93/7 appears stable. There is also a 8 mm node between the portal vein and IVC on image 95/7. Spleen: Normal in size without focal abnormality. Adrenals/Urinary Tract: Adrenal glands and kidneys are unremarkable and stable. Stable simple right renal cyst. The bladder is unremarkable. Stomach/Bowel: The stomach, duodenum, small bowel and colon are grossly normal without oral contrast. No inflammatory changes, mass lesions or obstructive findings. The appendix is normal. Vascular/Lymphatic:  Stable atherosclerotic calcifications involving the aorta and iliac arteries. The major venous structures are patent. Small periportal and celiac axis lymph nodes appears stable. Reproductive: Surgically absent. Other: No pelvic mass or adenopathy. No free pelvic fluid collections. No inguinal mass or adenopathy. No abdominal wall hernia or subcutaneous lesions. Musculoskeletal: No findings to suggest metastatic bone disease. IMPRESSION: 1. Stable pulmonary nodules.  No new or progressive findings. 2. Stable 2.0 x 1.8 cm pancreatic head mass. 3. New 2.3 x 2.1 cm somewhat rim enhancing lesion at the head body junction region projecting up into the lesser sac. In retrospect on the prior CT scan there was a small enhancing  nodule in this area which measured 9 mm. Findings could suggest a second pancreatic tumor or possibly metastatic adenopathy adjacent to the pancreas. Recommend PET-CT for further evaluation. 4. Stable small periportal and celiac axis lymph nodes. 5. Stable scattered low-attenuation hepatic lesions consistent with benign cysts. 6. Stable emphysematous changes and pulmonary scarring. Aortic Atherosclerosis (ICD10-I70.0) and Emphysema (ICD10-J43.9). Electronically Signed   By: MYRTIS Stammer M.D.   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  She presents today with a new pancreatic lesion visualized on most recent CT, concerning for a second primary versus metastatic lesion from her previously treated pancreatic cancer.  She is fortunately asymptomatic from this lesion.  Dr. Shannon recommends radiation therapy to provide local regional control to this new lesion.  We discussed treatment options of SBRT versus 10 fractions of conventional palliative radiation.  Patient understands that we will determine best course of treatment after CT simulation.  We discussed the natural history of pancreatic cancer and general treatment, highlighting the role of radiotherapy in the management.  We discussed the available radiation techniques, and focused on the details of logistics and delivery.  We reviewed the anticipated acute and late sequelae associated with radiation in this setting.  The patient was encouraged to ask questions that I answered to the best of my ability. A patient consent form was discussed and signed.  We retained a copy for our records.  The patient would like to proceed with radiation and will be scheduled for CT simulation.  PLAN: Patient is scheduled for CT simulation on 06/17/2024.  We look forward to  participating in this patient's care.   We spent 40 minutes face to face with the patient and more than 50% of that time was spent in counseling and/or coordination of care.   ------------------------------------------------   Leeroy Due, PA-C   Lynwood CHARM Shannon, PhD, MD   [facility]  Radiation Oncology Direct Dial: [phone]  Fax: [phone] [email or web address]

## 2024-06-15 ENCOUNTER — Other Ambulatory Visit: Payer: Self-pay

## 2024-06-16 NOTE — Progress Notes (Signed)
 Has armband been applied?  {yes no:314532}  Does patient have an allergy to IV contrast dye?: {yes no:314532}   Has patient ever received premedication for IV contrast dye?: {yes no:314532}   Does patient take metformin?: {yes no:314532}  If patient does take metformin when was the last dose: {Time; dates multiple:15870}  Date of lab work: May 25, 2024 BUN: 19 CR: 0.75 eGfr: >60  IV site: {iv locations:314275}  Has IV site been added to flowsheet?  {yes no:314532}  There were no vitals taken for this visit.

## 2024-06-17 ENCOUNTER — Ambulatory Visit
Admission: RE | Admit: 2024-06-17 | Discharge: 2024-06-17 | Disposition: A | Discharge status: Home / Self Care | Source: Ambulatory Visit | Attending: Radiation Oncology | Admitting: Radiation Oncology

## 2024-06-17 DIAGNOSIS — C250 Malignant neoplasm of head of pancreas: Secondary | ICD-10-CM

## 2024-06-17 DIAGNOSIS — Z510 Encounter for antineoplastic radiation therapy: Secondary | ICD-10-CM | POA: Insufficient documentation

## 2024-07-04 DIAGNOSIS — Z510 Encounter for antineoplastic radiation therapy: Secondary | ICD-10-CM | POA: Diagnosis not present

## 2024-07-05 ENCOUNTER — Other Ambulatory Visit: Payer: Self-pay

## 2024-07-05 ENCOUNTER — Ambulatory Visit
Admission: RE | Admit: 2024-07-05 | Discharge: 2024-07-05 | Disposition: A | Discharge status: Home / Self Care | Source: Ambulatory Visit | Attending: Radiation Oncology | Admitting: Radiation Oncology

## 2024-07-05 DIAGNOSIS — C250 Malignant neoplasm of head of pancreas: Secondary | ICD-10-CM

## 2024-07-05 DIAGNOSIS — Z510 Encounter for antineoplastic radiation therapy: Secondary | ICD-10-CM | POA: Diagnosis not present

## 2024-07-05 LAB — RAD ONC ARIA SESSION SUMMARY
Course Elapsed Days: 0
Plan Fractions Treated to Date: 1
Plan Prescribed Dose Per Fraction: 4 Gy
Plan Total Fractions Prescribed: 10
Plan Total Prescribed Dose: 40 Gy
Reference Point Dosage Given to Date: 4 Gy
Reference Point Session Dosage Given: 4 Gy
Session Number: 1

## 2024-07-06 ENCOUNTER — Ambulatory Visit
Admission: RE | Admit: 2024-07-06 | Discharge: 2024-07-06 | Disposition: A | Discharge status: Home / Self Care | Source: Ambulatory Visit | Attending: Radiation Oncology | Admitting: Radiation Oncology

## 2024-07-06 ENCOUNTER — Other Ambulatory Visit: Payer: Self-pay

## 2024-07-06 ENCOUNTER — Ambulatory Visit: Admitting: Radiation Oncology

## 2024-07-06 DIAGNOSIS — Z510 Encounter for antineoplastic radiation therapy: Secondary | ICD-10-CM | POA: Diagnosis not present

## 2024-07-06 LAB — RAD ONC ARIA SESSION SUMMARY
Course Elapsed Days: 1
Plan Fractions Treated to Date: 2
Plan Prescribed Dose Per Fraction: 4 Gy
Plan Total Fractions Prescribed: 10
Plan Total Prescribed Dose: 40 Gy
Reference Point Dosage Given to Date: 8 Gy
Reference Point Session Dosage Given: 4 Gy
Session Number: 2

## 2024-07-07 ENCOUNTER — Ambulatory Visit: Admitting: Radiation Oncology

## 2024-07-07 ENCOUNTER — Ambulatory Visit
Admission: RE | Admit: 2024-07-07 | Discharge: 2024-07-07 | Disposition: A | Discharge status: Home / Self Care | Source: Ambulatory Visit | Attending: Radiation Oncology | Admitting: Radiation Oncology

## 2024-07-07 ENCOUNTER — Other Ambulatory Visit: Payer: Self-pay

## 2024-07-07 DIAGNOSIS — Z510 Encounter for antineoplastic radiation therapy: Secondary | ICD-10-CM | POA: Diagnosis not present

## 2024-07-07 LAB — RAD ONC ARIA SESSION SUMMARY
Course Elapsed Days: 2
Plan Fractions Treated to Date: 3
Plan Prescribed Dose Per Fraction: 4 Gy
Plan Total Fractions Prescribed: 10
Plan Total Prescribed Dose: 40 Gy
Reference Point Dosage Given to Date: 12 Gy
Reference Point Session Dosage Given: 4 Gy
Session Number: 3

## 2024-07-08 ENCOUNTER — Other Ambulatory Visit: Payer: Self-pay

## 2024-07-08 ENCOUNTER — Ambulatory Visit
Admission: RE | Admit: 2024-07-08 | Discharge: 2024-07-08 | Attending: Radiation Oncology | Admitting: Radiation Oncology

## 2024-07-08 ENCOUNTER — Ambulatory Visit: Admitting: Radiation Oncology

## 2024-07-08 DIAGNOSIS — Z510 Encounter for antineoplastic radiation therapy: Secondary | ICD-10-CM | POA: Diagnosis not present

## 2024-07-08 LAB — RAD ONC ARIA SESSION SUMMARY
Course Elapsed Days: 3
Plan Fractions Treated to Date: 4
Plan Prescribed Dose Per Fraction: 4 Gy
Plan Total Fractions Prescribed: 10
Plan Total Prescribed Dose: 40 Gy
Reference Point Dosage Given to Date: 16 Gy
Reference Point Session Dosage Given: 4 Gy
Session Number: 4

## 2024-07-09 ENCOUNTER — Ambulatory Visit
Admission: RE | Admit: 2024-07-09 | Discharge: 2024-07-09 | Disposition: A | Discharge status: Home / Self Care | Source: Ambulatory Visit | Attending: Radiation Oncology | Admitting: Radiation Oncology

## 2024-07-09 ENCOUNTER — Other Ambulatory Visit: Payer: Self-pay

## 2024-07-09 ENCOUNTER — Ambulatory Visit: Admitting: Radiation Oncology

## 2024-07-09 DIAGNOSIS — Z510 Encounter for antineoplastic radiation therapy: Secondary | ICD-10-CM | POA: Diagnosis not present

## 2024-07-09 LAB — RAD ONC ARIA SESSION SUMMARY
Course Elapsed Days: 4
Plan Fractions Treated to Date: 5
Plan Prescribed Dose Per Fraction: 4 Gy
Plan Total Fractions Prescribed: 10
Plan Total Prescribed Dose: 40 Gy
Reference Point Dosage Given to Date: 20 Gy
Reference Point Session Dosage Given: 4 Gy
Session Number: 5

## 2024-07-12 ENCOUNTER — Ambulatory Visit
Admission: RE | Admit: 2024-07-12 | Discharge: 2024-07-12 | Disposition: A | Discharge status: Home / Self Care | Source: Ambulatory Visit | Attending: Radiation Oncology | Admitting: Radiation Oncology

## 2024-07-12 ENCOUNTER — Other Ambulatory Visit: Payer: Self-pay

## 2024-07-12 DIAGNOSIS — Z510 Encounter for antineoplastic radiation therapy: Secondary | ICD-10-CM | POA: Insufficient documentation

## 2024-07-12 DIAGNOSIS — C250 Malignant neoplasm of head of pancreas: Secondary | ICD-10-CM | POA: Insufficient documentation

## 2024-07-12 LAB — RAD ONC ARIA SESSION SUMMARY
Course Elapsed Days: 7
Plan Fractions Treated to Date: 6
Plan Prescribed Dose Per Fraction: 4 Gy
Plan Total Fractions Prescribed: 10
Plan Total Prescribed Dose: 40 Gy
Reference Point Dosage Given to Date: 24 Gy
Reference Point Session Dosage Given: 4 Gy
Session Number: 6

## 2024-07-13 ENCOUNTER — Inpatient Hospital Stay: Attending: Internal Medicine

## 2024-07-13 ENCOUNTER — Ambulatory Visit: Admitting: Radiation Oncology

## 2024-07-13 ENCOUNTER — Ambulatory Visit
Admission: RE | Admit: 2024-07-13 | Discharge: 2024-07-13 | Disposition: A | Discharge status: Home / Self Care | Source: Ambulatory Visit | Attending: Radiation Oncology | Admitting: Radiation Oncology

## 2024-07-13 ENCOUNTER — Other Ambulatory Visit: Payer: Self-pay

## 2024-07-13 ENCOUNTER — Inpatient Hospital Stay (HOSPITAL_BASED_OUTPATIENT_CLINIC_OR_DEPARTMENT_OTHER): Admitting: Hematology

## 2024-07-13 VITALS — BP 120/76 | HR 76 | Temp 98.1°F | Resp 17 | Ht 62.5 in | Wt 131.3 lb

## 2024-07-13 DIAGNOSIS — Z510 Encounter for antineoplastic radiation therapy: Secondary | ICD-10-CM | POA: Diagnosis not present

## 2024-07-13 DIAGNOSIS — C250 Malignant neoplasm of head of pancreas: Secondary | ICD-10-CM

## 2024-07-13 DIAGNOSIS — C3492 Malignant neoplasm of unspecified part of left bronchus or lung: Secondary | ICD-10-CM

## 2024-07-13 DIAGNOSIS — C3490 Malignant neoplasm of unspecified part of unspecified bronchus or lung: Secondary | ICD-10-CM

## 2024-07-13 LAB — CBC WITH DIFFERENTIAL (CANCER CENTER ONLY)
Abs Immature Granulocytes: 0.03 K/uL (ref 0.00–0.07)
Basophils Absolute: 0 K/uL (ref 0.0–0.1)
Basophils Relative: 0 %
Eosinophils Absolute: 0.1 K/uL (ref 0.0–0.5)
Eosinophils Relative: 1 %
HCT: 34.1 % — ABNORMAL LOW (ref 36.0–46.0)
Hemoglobin: 11.7 g/dL — ABNORMAL LOW (ref 12.0–15.0)
Immature Granulocytes: 1 %
Lymphocytes Relative: 10 %
Lymphs Abs: 0.6 K/uL — ABNORMAL LOW (ref 0.7–4.0)
MCH: 33.4 pg (ref 26.0–34.0)
MCHC: 34.3 g/dL (ref 30.0–36.0)
MCV: 97.4 fL (ref 80.0–100.0)
Monocytes Absolute: 0.4 K/uL (ref 0.1–1.0)
Monocytes Relative: 6 %
Neutro Abs: 5.2 K/uL (ref 1.7–7.7)
Neutrophils Relative %: 82 %
Platelet Count: 173 K/uL (ref 150–400)
RBC: 3.5 MIL/uL — ABNORMAL LOW (ref 3.87–5.11)
RDW: 12.1 % (ref 11.5–15.5)
WBC Count: 6.3 K/uL (ref 4.0–10.5)
nRBC: 0 % (ref 0.0–0.2)

## 2024-07-13 LAB — RAD ONC ARIA SESSION SUMMARY
Course Elapsed Days: 8
Plan Fractions Treated to Date: 7
Plan Prescribed Dose Per Fraction: 4 Gy
Plan Total Fractions Prescribed: 10
Plan Total Prescribed Dose: 40 Gy
Reference Point Dosage Given to Date: 28 Gy
Reference Point Session Dosage Given: 4 Gy
Session Number: 7

## 2024-07-13 LAB — CMP (CANCER CENTER ONLY)
ALT: 8 U/L (ref 0–44)
AST: 14 U/L — ABNORMAL LOW (ref 15–41)
Albumin: 3.8 g/dL (ref 3.5–5.0)
Alkaline Phosphatase: 66 U/L (ref 38–126)
Anion gap: 6 (ref 5–15)
BUN: 14 mg/dL (ref 8–23)
CO2: 28 mmol/L (ref 22–32)
Calcium: 9.4 mg/dL (ref 8.9–10.3)
Chloride: 104 mmol/L (ref 98–111)
Creatinine: 0.71 mg/dL (ref 0.44–1.00)
GFR, Estimated: >60 mL/min (ref >60)
Glucose, Bld: 133 mg/dL — ABNORMAL HIGH (ref 70–99)
Potassium: 3.6 mmol/L (ref 3.5–5.1)
Sodium: 138 mmol/L (ref 135–145)
Total Bilirubin: 0.6 mg/dL (ref 0.0–1.2)
Total Protein: 6.2 g/dL — ABNORMAL LOW (ref 6.5–8.1)

## 2024-07-13 NOTE — Progress Notes (Signed)
 [facility]   [phone] Fax:([phone]   Clinic Follow up Note   Patient Care Team: Beam, Lamar POUR, MD as PCP - General (Family Medicine) Dann Candyce RAMAN, MD as PCP - Cardiology (Cardiology) Kerrin Elspeth BROCKS, MD as Consulting Physician (Cardiothoracic Surgery) Lanny Callander, MD as Consulting Physician (Oncology) Ann Mayme POUR, NP as Nurse Practitioner (Nurse Practitioner) Shannon Agent, MD as Consulting Physician (Radiation Oncology)  Date of Service:  07/13/2024  CHIEF COMPLAINT: f/u of pancreatic cancer  CURRENT THERAPY:  Radiation  Oncology History   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 -Patient declined Whipple surgery due to her advanced age and diagnosis of lung cancer at the same time. -Patient agreed with low intensity chemotherapy with single agent gemcitabine  and started on October 8, 2024and completed in Jan 2025, followed by consolidation radiation SBRT which she completed in March 2025 -restaging CT 02/27/24 showed improved pancreatic mass, and a small indeterminate liver lesion, no definitive evidence of metastasis.  Assessment & Plan Pancreatic cancer, head of pancreas, undergoing radiation therapy Pancreatic cancer at the head of the pancreas, currently undergoing radiation therapy. She is in the final week of her second round of radiation. Tumor marker was high in September, and a repeat test was done today, expected to remain high due to recent start of radiation. Anticipated decrease in tumor marker post-radiation. - Complete current radiation therapy this week - Will schedule CT scan for the first week of January - Monitor tumor marker levels post-radiation  Gastritis Symptoms of  stomach discomfort and burning, managed with Protonix. Symptoms are not significantly worse than before radiation therapy. - Continue Protonix as needed for stomach discomfort  Fatigue and decreased appetite secondary to pancreatic cancer and radiation therapy Fatigue and decreased appetite attributed to pancreatic cancer and ongoing radiation therapy. She reports tiredness and low energy levels, but is managing to maintain weight by eating throughout the day. No nausea reported, but stomach feels unsettled after eating a few bites. - Encouraged maintaining nutritional intake to support weight - Instructed to monitor energy levels and appetite  Plan - She is tolerating radiation moderately well, plan to complete later this week. - Due to the coming holidays, she would like to wait until first week of January to repeat the CT scan, and I will see her back 1 week after - She knows to call us  if she has worsening symptoms before next appointment.   SUMMARY OF ONCOLOGIC HISTORY: Oncology History  Adenocarcinoma of left lung, stage 1 (HCC)  07/06/2018 Initial Diagnosis   Adenocarcinoma of left lung, stage 1 (HCC)   02/15/2021 Cancer Staging   Staging form: Lung, AJCC 8th Edition - Clinical: Stage IA3 (cT1c, cN0, cM0) - Signed by Sherrod Sherrod, MD on 02/15/2021   Adenocarcinoma of head of pancreas (HCC)  03/03/2023 Imaging   CT chest IMPRESSION: 1. Enlarging and concerning right lung nodules, the largest in the right middle lobe measuring up to 1.4 cm in diameter, highly suspicious for primary bronchogenic carcinoma. The right middle lobe nodule is large enough to evaluate by PET-CT which is recommended. 2. No evidence of thoracic adenopathy or pleural effusion. No suspicious left lung nodule status post upper lobe resection. 3. Aortic Atherosclerosis (ICD10-I70.0) and Emphysema (ICD10-J43.9).   03/28/2023 PET scan   IMPRESSION: 1.  12 mm medial right middle lobe pulmonary nodule is  hypermetabolic and consistent with primary neoplasm. 2. 7 mm right lower lobe nodule and 7 mm right upper lobe nodule do not demonstrate any hypermetabolism but will need close surveillance. 3. No mediastinal or hilar lymphadenopathy. 4. No findings for osseous metastatic disease. 5. Abnormal appearance of the pancreatic head and mild hypermetabolism worrisome for neoplasm. Recommend MRI abdomen without and with contrast for further evaluation. Aortic Atherosclerosis (ICD10-I70.0).   05/04/2023 Imaging   ABD MRI IMPRESSION: 1. Suspicious, solid lesion within the head of pancreas 3.0 x 2.4 x 2.4 cm. There is associated main duct dilatation within the head through tail of pancreas. Suspected lesion corresponds to the area of increased tracer uptake noted on the recent PET-CT. Findings are concerning for pancreatic adenocarcinoma. Motion artifact on the postcontrast images diminishes exam detail within the pancreas. Difficult to exclude involvement of the portal vein. The celiac trunk and proximal SMA appear uninvolved. Consider further evaluation with endoscopic ultrasound and possibly CTA of the abdomen. 2. Scattered T2 hyperintense and T1 hypointense foci within the liver are favored to represent multiple cysts. Unfortunately, motion artifact diminishes exam detail within the liver for the evaluation underlying enhancing liver lesions. Within this limitation, no obvious suspicious lesion identified at this time. 3. Right middle lobe lung nodule is again identified as seen on the previous PET-CT.   05/04/2023 Imaging   Brain MRI IMPRESSION: No evidence of intracranial metastatic disease.   05/16/2023 Tumor Marker   CA 19-9: 256   05/22/2023 Cancer Staging   Staging form: Exocrine Pancreas, AJCC 8th Edition - Clinical stage from 05/22/2023: Stage IB (cT2, cN0, cM0) - Signed by Burton, Lacie K, NP on 06/09/2023 Stage prefix: Initial diagnosis Total positive nodes: 0   05/22/2023  Procedure   EUS by Dr. Wilhelmenia: 29 x 28 mm mass in the pancreatic head appearing suspicious for adeno, no malignant appearing lymph nodes were visualized.  Abuts the gastroduodenal artery but not involved with the SMA or celiac trunk.  T2 N0 by EUS    05/22/2023 Initial Biopsy    A. PANCREAS, HEAD LESION, FINE NEEDLE ASPIRATION:  FINAL MICROSCOPIC DIAGNOSIS:  - Adenocarcinoma    06/09/2023 Initial Diagnosis   Adenocarcinoma of head of pancreas (HCC)   06/17/2023 -  Chemotherapy   Patient is on Treatment Plan : LUNG Gemcitabine  (1000) D1,8 q21d      Genetic Testing   [facility] CancerNext-Expanded Panel+RNA was Negative. Report date is 06/24/2023.   The CancerNext-Expanded gene panel offered by [facility] and includes sequencing, rearrangement, and RNA analysis for the following 71 genes: AIP, ALK, APC, ATM, AXIN2, BAP1, BARD1, BMPR1A, BRCA1, BRCA2, BRIP1, CDC73, CDH1, CDK4, CDKN1B, CDKN2A, CHEK2, CTNNA1, DICER1, FH, FLCN, KIF1B, LZTR1, MAX, MEN1, MET, MLH1, MSH2, MSH3, MSH6, MUTYH, NF1, NF2, NTHL1, PALB2, PHOX2B, PMS2, POT1, PRKAR1A, PTCH1, PTEN, RAD51C, RAD51D, RB1, RET, SDHA, SDHAF2, SDHB, SDHC, SDHD, SMAD4, SMARCA4, SMARCB1, SMARCE1, STK11, SUFU, TMEM127, TP53, TSC1, TSC2, and VHL (sequencing and deletion/duplication); EGFR, EGLN1, HOXB13, KIT, MITF, PDGFRA, POLD1, and POLE (sequencing only); EPCAM and GREM1 (deletion/duplication only).    09/08/2023 Imaging   CT abdomen and pelvis with contrast  IMPRESSION: 1. Given cross modality comparison to the MRI of 05/04/2023, similar to minimal decrease in size of the pancreatic head mass. No vascular involvement or metastatic disease identified. 2. Apparent gastric body wall thickening could be due to underdistention. Correlate with symptoms of gastritis. 3. Incidental findings, including: Aortic atherosclerosis (ICD10-I70.0) and emphysema (ICD10-J43.9). Small hiatal  hernia.      Discussed the use of AI scribe software for clinical note  transcription with the patient, who gave verbal consent to proceed.  History of Present Illness [name] is an 83 year old female with pancreatic cancer who presents for follow-up.  She is undergoing her second round of radiation therapy, with the last session scheduled for the end of the week. She experiences fatigue and decreased appetite, feeling hungry but only able to eat a few bites before her stomach feels unsettled. There is no nausea or issues with bowel movements.  She experiences stomach discomfort and burning, which she attributes to gastritis. Protonix provides some relief, although occasional discomfort persists. These symptoms were present before starting radiation and have not worsened.  She feels sore, which she attributes to the tight belt used during radiation therapy. Her energy levels are low, and she feels tired. She denies needing stronger pain medication.  She confirms that she still has a port in place.     All other systems were reviewed with the patient and are negative.  MEDICAL HISTORY:  Past Medical History:  Diagnosis Date   Anxiety    Blood glucose elevated 05/27/2012   pt. 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   Overview:  Dr Rollin  Last Assessment & Plan:  Relevant Hx: Course: Daily Update: Today's Plan:    GERD (gastroesophageal reflux disease)    Hemorrhoid 12/06/2015   History of hiatal hernia    History of radiation therapy    Right Lung- 06/30/23-07/14/23- Dr. Lynwood Nasuti   History of radiation therapy    Pancreas- 11/19/23-11/28/23- Dr. Lynwood Nasuti   HLD (hyperlipidemia) 12/03/2011   Hypertension    Lung cancer  (HCC) dx'd 04/2018   with recurrence 02/2023   Osteopenia 05/30/2015   Overview:  Bone density 05/2015 started fosamax    Pancreatic cancer (HCC) 02/2023   Primary localized osteoarthritis of left knee    Primary localized osteoarthritis of right knee 12/06/2015    SURGICAL HISTORY: Past Surgical History:  Procedure Laterality Date   ABDOMINAL HYSTERECTOMY     BIOPSY  05/22/2023   Procedure: BIOPSY;  Surgeon: Wilhelmenia Aloha Raddle., MD;  Location: [facility];  Service: Gastroenterology;;   CATARACT EXTRACTION, BILATERAL     COLONOSCOPY     ESOPHAGOGASTRODUODENOSCOPY (EGD) WITH PROPOFOL  N/A 05/22/2023   Procedure: ESOPHAGOGASTRODUODENOSCOPY (EGD) WITH PROPOFOL ;  Surgeon: Wilhelmenia Aloha Raddle., MD;  Location: [facility];  Service: Gastroenterology;  Laterality: N/A;   ESOPHAGOGASTRODUODENOSCOPY (EGD) WITH PROPOFOL  N/A 11/06/2023   Procedure: ESOPHAGOGASTRODUODENOSCOPY (EGD) WITH PROPOFOL ;  Surgeon: Wilhelmenia Aloha Raddle., MD;  Location: [facility];  Service: Gastroenterology;  Laterality: N/A;   EUS N/A 05/22/2023   Procedure: UPPER ENDOSCOPIC ULTRASOUND (EUS) RADIAL;  Surgeon: Wilhelmenia Aloha Raddle., MD;  Location: [facility];  Service: Gastroenterology;  Laterality: N/A;   EUS N/A 11/06/2023   Procedure: UPPER ENDOSCOPIC ULTRASOUND (EUS) RADIAL;  Surgeon: Wilhelmenia Aloha Raddle., MD;  Location: [facility];  Service: Gastroenterology;  Laterality: N/A;   EYE SURGERY Bilateral    Cataract   FIDUCIAL MARKER PLACEMENT N/A 11/06/2023   Procedure: FIDUCIAL MARKER PLACEMENT;  Surgeon: Wilhelmenia Aloha  Mickey., MD;  Location: [facility];  Service: Gastroenterology;  Laterality: N/A;   FINE NEEDLE ASPIRATION  05/22/2023   Procedure: FINE NEEDLE ASPIRATION;  Surgeon: Wilhelmenia Aloha Mickey., MD;  Location: [facility];  Service: Gastroenterology;;   implantable loop recorder placement  10/26/2018   MDT LINQ Reveal XT ILR implanted for evaluation of palpitations and atrial fibrillation in  office by Dr Kelsie, Device SN [id number])    IR IMAGING GUIDED PORT INSERTION  06/24/2023   KNEE SURGERY  05/01/2016   left uncompartmental    PARTIAL KNEE ARTHROPLASTY Right 12/18/2015   Procedure: UNICOMPARTMENTAL RIGHT KNEE;  Surgeon: Lamar Millman, MD;  Location: [facility] OR;  Service: Orthopedics;  Laterality: Right;   PARTIAL KNEE ARTHROPLASTY Left 05/01/2016   Procedure: UNICOMPARTMENTAL KNEE;  Surgeon: Lamar Millman, MD;  Location: [facility] OR;  Service: Orthopedics;  Laterality: Left;   POLYPECTOMY  05/22/2023   Procedure: POLYPECTOMY;  Surgeon: Mansouraty, Aloha Mickey., MD;  Location: [facility];  Service: Gastroenterology;;   TOTAL ABDOMINAL HYSTERECTOMY W/ BILATERAL SALPINGOOPHORECTOMY     VIDEO ASSISTED THORACOSCOPY (VATS)/ LOBECTOMY Left 05/13/2018   Procedure: VIDEO ASSISTED THORACOSCOPY (VATS)/LEFT UPPER LOBECTOMY;  Surgeon: Kerrin Elspeth BROCKS, MD;  Location: [facility] OR;  Service: Thoracic;  Laterality: Left;    I have reviewed the social history and family history with the patient and they are unchanged from previous note.  ALLERGIES:  is allergic to ambien  [zolpidem  tartrate], sulfa antibiotics, codeine, hydrocodone , and meperidine.  MEDICATIONS:  Current Outpatient Medications  Medication Sig Dispense Refill   albuterol  (VENTOLIN  HFA) 108 (90 Base) MCG/ACT inhaler Inhale 1-2 puffs into the lungs 3 (three) times daily as needed for wheezing or shortness of breath. 1 each 0   Calcium  Carb-Cholecalciferol (CALCIUM -VITAMIN D3) 600-200 MG-UNIT TABS Take by mouth daily.     cetirizine (ZYRTEC) 10 MG tablet Take 10 mg by mouth as needed for allergies.     diphenhydramine -acetaminophen  (TYLENOL  PM) 25-500 MG TABS tablet Take 2 tablets by mouth at bedtime as needed (for sleep).     escitalopram (LEXAPRO) 20 MG tablet Take by mouth daily.     fluticasone  (FLONASE ) 50 MCG/ACT nasal spray PLACE 1-2 SPRAYS INTO BOTH NOSTRILS DAILY. 48 mL 1   lidocaine -prilocaine  (EMLA ) cream Apply to affected area  once 30 g 3   metoprolol  succinate (TOPROL -XL) 50 MG 24 hr tablet Take 50 mg by mouth daily. Take with or immediately following a meal.     Multiple Vitamins-Minerals (MULTIVITAMIN PO) Take 1 tablet by mouth daily.     ondansetron  (ZOFRAN ) 8 MG tablet Take 1 tablet (8 mg total) by mouth every 8 (eight) hours as needed for nausea or vomiting. 30 tablet 1   pantoprazole (PROTONIX) 40 MG tablet Take 40 mg by mouth daily.     polyethylene glycol (MIRALAX  / GLYCOLAX ) packet Take 8.5 g by mouth every other day.     prochlorperazine  (COMPAZINE ) 10 MG tablet Take 1 tablet (10 mg total) by mouth every 6 (six) hours as needed for nausea or vomiting. 30 tablet 1   simvastatin  (ZOCOR ) 20 MG tablet Take 20 mg by mouth daily.     No current facility-administered medications for this visit.    PHYSICAL EXAMINATION: ECOG PERFORMANCE STATUS: 2 - Symptomatic, <50% confined to bed  Vitals:   07/13/24 0900  BP: 120/76  Pulse: 76  Resp: 17  Temp: 98.1 F (36.7 C)  SpO2: 98%   Wt Readings from Last 3 Encounters:  07/13/24 131 lb 4.8 oz (59.6 kg)  06/17/24 (  P) 129 lb 4 oz (58.6 kg)  06/10/24 130 lb (59 kg)     GENERAL:alert, no distress and comfortable SKIN: skin color, texture, turgor are normal, no rashes or significant lesions EYES: normal, Conjunctiva are pink and non-injected, sclera clear NECK: supple, thyroid normal size, non-tender, without nodularity LYMPH:  no palpable lymphadenopathy in the cervical, axillary  LUNGS: clear to auscultation and percussion with normal breathing effort HEART: regular rate & rhythm and no murmurs and no lower extremity edema ABDOMEN:abdomen soft, non-tender and normal bowel sounds Musculoskeletal:no cyanosis of digits and no clubbing  NEURO: alert & oriented x 3 with fluent speech, no focal motor/sensory deficits  Physical Exam    LABORATORY DATA:  I have reviewed the data as listed    Latest Ref Rng & Units 07/13/2024    8:54 AM 05/25/2024   12:46 PM  04/13/2024   10:11 AM  CBC  WBC 4.0 - 10.5 K/uL 6.3  6.2  5.0   Hemoglobin 12.0 - 15.0 g/dL 88.2  87.4  87.8   Hematocrit 36.0 - 46.0 % 34.1  35.5  34.8   Platelets 150 - 400 K/uL 173  168  141         Latest Ref Rng & Units 07/13/2024    8:54 AM 05/25/2024   12:46 PM 04/13/2024   10:11 AM  CMP  Glucose 70 - 99 mg/dL 866  852  892   BUN 8 - 23 mg/dL 14  19  17    Creatinine 0.44 - 1.00 mg/dL 9.28  9.24  9.23   Sodium 135 - 145 mmol/L 138  135  137   Potassium 3.5 - 5.1 mmol/L 3.6  3.7  3.7   Chloride 98 - 111 mmol/L 104  102  104   CO2 22 - 32 mmol/L 28  28  29    Calcium  8.9 - 10.3 mg/dL 9.4  9.1  8.7   Total Protein 6.5 - 8.1 g/dL 6.2  6.2  6.0   Total Bilirubin 0.0 - 1.2 mg/dL 0.6  0.6  0.5   Alkaline Phos 38 - 126 U/L 66  76  61   AST 15 - 41 U/L 14  18  25    ALT 0 - 44 U/L 8  9  13        RADIOGRAPHIC STUDIES: I have personally reviewed the radiological images as listed and agreed with the findings in the report. No results found.    Orders Placed This Encounter  Procedures   CT CHEST ABDOMEN PELVIS W CONTRAST    Standing Status:   Future    Expected Date:   09/12/2024    Expiration Date:   07/13/2025    If indicated for the ordered procedure, I authorize the administration of contrast media per Radiology protocol:   Yes    Does the patient have a contrast media/X-ray dye allergy?:   No    Preferred imaging location?:   [facility]    If indicated for the ordered procedure, I authorize the administration of oral contrast media per Radiology protocol:   Yes   All questions were answered. The patient knows to call the clinic with any problems, questions or concerns. No barriers to learning was detected. The total time spent in the appointment was 25 minutes, including review of chart and various tests results, discussions about plan of care and coordination of care plan     Onita Mattock, MD 07/13/2024

## 2024-07-13 NOTE — Assessment & Plan Note (Signed)
 cT3N0M0 -Incidental findings on the CT scan for lung cancer screening/follow-up -Diagnosed in September 2024 by EUS baseline CA 19.9 256 -Patient has synchronized 2 hypermetabolic lesion in her lungs, highly suspicious for malignancy. -Patient declined Whipple surgery due to her advanced age and diagnosis of lung cancer at the same time. -Patient agreed with low intensity chemotherapy with single agent gemcitabine  and started on October 8, 2024and completed in Jan 2025, followed by consolidation radiation SBRT which she completed in March 2025 -restaging CT 02/27/24 showed improved pancreatic mass, and a small indeterminate liver lesion, no definitive evidence of metastasis.

## 2024-07-14 ENCOUNTER — Other Ambulatory Visit: Payer: Self-pay

## 2024-07-14 ENCOUNTER — Ambulatory Visit
Admission: RE | Admit: 2024-07-14 | Discharge: 2024-07-14 | Disposition: A | Source: Ambulatory Visit | Attending: Radiation Oncology | Admitting: Radiation Oncology

## 2024-07-14 DIAGNOSIS — Z510 Encounter for antineoplastic radiation therapy: Secondary | ICD-10-CM | POA: Diagnosis not present

## 2024-07-14 LAB — RAD ONC ARIA SESSION SUMMARY
Course Elapsed Days: 9
Plan Fractions Treated to Date: 8
Plan Prescribed Dose Per Fraction: 4 Gy
Plan Total Fractions Prescribed: 10
Plan Total Prescribed Dose: 40 Gy
Reference Point Dosage Given to Date: 32 Gy
Reference Point Session Dosage Given: 4 Gy
Session Number: 8

## 2024-07-14 LAB — CANCER ANTIGEN 19-9: CA 19-9: 508 U/mL — ABNORMAL HIGH (ref 0–35)

## 2024-07-15 ENCOUNTER — Ambulatory Visit
Admission: RE | Admit: 2024-07-15 | Discharge: 2024-07-15 | Disposition: A | Discharge status: Home / Self Care | Source: Ambulatory Visit | Attending: Radiation Oncology | Admitting: Radiation Oncology

## 2024-07-15 ENCOUNTER — Ambulatory Visit: Admitting: Radiation Oncology

## 2024-07-15 ENCOUNTER — Other Ambulatory Visit: Payer: Self-pay

## 2024-07-15 DIAGNOSIS — Z510 Encounter for antineoplastic radiation therapy: Secondary | ICD-10-CM | POA: Diagnosis not present

## 2024-07-15 LAB — RAD ONC ARIA SESSION SUMMARY
Course Elapsed Days: 10
Plan Fractions Treated to Date: 9
Plan Prescribed Dose Per Fraction: 4 Gy
Plan Total Fractions Prescribed: 10
Plan Total Prescribed Dose: 40 Gy
Reference Point Dosage Given to Date: 36 Gy
Reference Point Session Dosage Given: 4 Gy
Session Number: 9

## 2024-07-16 ENCOUNTER — Other Ambulatory Visit: Payer: Self-pay

## 2024-07-16 ENCOUNTER — Ambulatory Visit
Admission: RE | Admit: 2024-07-16 | Discharge: 2024-07-16 | Disposition: A | Discharge status: Home / Self Care | Source: Ambulatory Visit | Attending: Radiation Oncology | Admitting: Radiation Oncology

## 2024-07-16 DIAGNOSIS — Z510 Encounter for antineoplastic radiation therapy: Secondary | ICD-10-CM | POA: Diagnosis not present

## 2024-07-16 LAB — RAD ONC ARIA SESSION SUMMARY
Course Elapsed Days: 11
Plan Fractions Treated to Date: 10
Plan Prescribed Dose Per Fraction: 4 Gy
Plan Total Fractions Prescribed: 10
Plan Total Prescribed Dose: 40 Gy
Reference Point Dosage Given to Date: 40 Gy
Reference Point Session Dosage Given: 4 Gy
Session Number: 10

## 2024-07-19 NOTE — Radiation Completion Notes (Addendum)
  Radiation Oncology         [phone] ________________________________  Name: STEPHENI CAMERON MRN: [id number]  Date of Service: 07/16/2024  DOB: [birth year]  End of Treatment Note   Diagnosis: New pancreatic lesion from Stage IIA (cT3, N0, M0) adenocarcinoma the head of the pancreas  Intent: Palliative     ==========DELIVERED PLANS==========  First Treatment Date: 2024-07-05 Last Treatment Date: 2024-07-16   Plan Name: Abd_UHRT Site: Pancreas Technique: IMRT Mode: Photon Dose Per Fraction: 4 Gy Prescribed Dose (Delivered / Prescribed): 40 Gy / 40 Gy Prescribed Fxs (Delivered / Prescribed): 10 / 10     ====================================   The patient tolerated radiation. She developed queasiness, but no actual nausea or emesis. She maintained her weight well throughout the treatment. She did also develop some fatigue from her treatment.   The patient will return in one month and will continue follow up with Dr. Lanny as well.      Ronita Due, PA-C

## 2024-08-06 ENCOUNTER — Other Ambulatory Visit: Payer: Self-pay | Admitting: Hematology

## 2024-08-06 DIAGNOSIS — C250 Malignant neoplasm of head of pancreas: Secondary | ICD-10-CM

## 2024-08-16 ENCOUNTER — Other Ambulatory Visit: Payer: Self-pay

## 2024-08-17 ENCOUNTER — Ambulatory Visit: Admitting: Radiology

## 2024-08-18 NOTE — Progress Notes (Signed)
 Toby Ayad is here today for follow-up post radiation to the head of the pancreas on 07/16/2024.    Radiation Site: Abdomen  Does the patient complain of any of the following:  Bowel/Bladder complaints, if any: no  Nausea / Vomiting, if any: yes, nausea at times.    Pain issues, if any:  no  Appetite: Fair, patient reports appetite is improving. Patient reports taste changes.   Post radiation Skin changes: No   Additional comments if applicable: Reports energy level has started to improve.   BP 129/69 (BP Location: Left Arm, Patient Position: Sitting)   Pulse 85   Temp (!) 97.1 F (36.2 C) (Temporal)   Resp 20   Ht 5' 2.5 (1.588 m)   Wt 125 lb 2 oz (56.8 kg)   SpO2 100%   BMI 22.52 kg/m

## 2024-08-19 ENCOUNTER — Encounter: Payer: Self-pay | Admitting: Radiation Oncology

## 2024-08-19 ENCOUNTER — Ambulatory Visit
Admission: RE | Admit: 2024-08-19 | Discharge: 2024-08-19 | Disposition: A | Discharge status: Home / Self Care | Source: Ambulatory Visit | Attending: Radiation Oncology | Admitting: Radiation Oncology

## 2024-08-19 VITALS — BP 129/69 | HR 85 | Temp 97.1°F | Resp 20 | Ht 62.5 in | Wt 125.1 lb

## 2024-08-19 DIAGNOSIS — C250 Malignant neoplasm of head of pancreas: Secondary | ICD-10-CM

## 2024-08-19 NOTE — Progress Notes (Signed)
 Radiation Oncology         [phone] ________________________________  Name: [name] MRN: [id number]  Date: 08/19/2024  DOB: January 06, 1941  Follow-Up Visit Note  CC: Beam, Lamar POUR, MD  Beam, Lamar POUR, MD  No diagnosis found.  Diagnosis:   New pancreatic lesion from Stage IIA (cT3, N0, M0) adenocarcinoma the head of the pancreas   Interval Since Last Radiation:  1 month 4 days  Intent: palliative  First Treatment Date: 2024-07-05 Last Treatment Date: 2024-07-16   Plan Name: Abd_UHRT Site: Pancreas Technique: IMRT Mode: Photon Dose Per Fraction: 4 Gy Prescribed Dose (Delivered / Prescribed): 40 Gy / 40 Gy Prescribed Fxs (Delivered / Prescribed): 10 / 10  Narrative:  The patient returns today for routine follow-up. She was last seen in office on 06/10/24 for a consultation visit. Since then, patient completed her radiation treatment which she tolerated quite well. Patient did however endorse experiencing She developed queasiness, but no actual nausea or emesis along with mild fatigue.   In the interval since she was last seen, she presented for a follow up visit with Dr. Lanny on 07/13/24 during which they opted to proceed with CT scans in January to monitor tumor markers and treatment effects. CT scan in scheduled for 09/13/24.   No other significant oncologic interval history since the patient was last seen.     Allergies:  is allergic to ambien  [zolpidem  tartrate], sulfa antibiotics, codeine, hydrocodone , and meperidine.  Meds: Current Outpatient Medications  Medication Sig Dispense Refill   albuterol  (VENTOLIN  HFA) 108 (90 Base) MCG/ACT inhaler Inhale 1-2 puffs into the lungs 3 (three) times daily as needed for wheezing or shortness of breath. 1 each 0   Calcium  Carb-Cholecalciferol (CALCIUM -VITAMIN D3) 600-200 MG-UNIT TABS Take by mouth daily.     cetirizine (ZYRTEC) 10 MG tablet Take 10 mg by mouth as needed for allergies.     diphenhydramine -acetaminophen   (TYLENOL  PM) 25-500 MG TABS tablet Take 2 tablets by mouth at bedtime as needed (for sleep).     escitalopram (LEXAPRO) 20 MG tablet Take by mouth daily.     fluticasone  (FLONASE ) 50 MCG/ACT nasal spray PLACE 1-2 SPRAYS INTO BOTH NOSTRILS DAILY. 48 mL 1   lidocaine -prilocaine  (EMLA ) cream Apply to affected area once 30 g 3   metoprolol  succinate (TOPROL -XL) 50 MG 24 hr tablet Take 50 mg by mouth daily. Take with or immediately following a meal.     Multiple Vitamins-Minerals (MULTIVITAMIN PO) Take 1 tablet by mouth daily.     ondansetron  (ZOFRAN ) 8 MG tablet Take 1 tablet (8 mg total) by mouth every 8 (eight) hours as needed for nausea or vomiting. 30 tablet 1   pantoprazole (PROTONIX) 40 MG tablet Take 40 mg by mouth daily.     polyethylene glycol (MIRALAX  / GLYCOLAX ) packet Take 8.5 g by mouth every other day.     prochlorperazine  (COMPAZINE ) 10 MG tablet Take 1 tablet (10 mg total) by mouth every 6 (six) hours as needed for nausea or vomiting. 30 tablet 1   simvastatin  (ZOCOR ) 20 MG tablet Take 20 mg by mouth daily.     No current facility-administered medications for this visit.    Physical Findings: The patient is in no acute distress. Patient is alert and oriented.  vitals were not taken for this visit. .  No significant changes. Lungs are clear to auscultation bilaterally. Heart has regular rate and rhythm. No palpable cervical, supraclavicular, or axillary adenopathy. Abdomen soft, non-tender, normal bowel sounds.  Lab Findings: Lab Results  Component Value Date   WBC 6.3 07/13/2024   HGB 11.7 (L) 07/13/2024   HCT 34.1 (L) 07/13/2024   MCV 97.4 07/13/2024   PLT 173 07/13/2024    Radiographic Findings: No results found.  Impression:  New pancreatic lesion from Stage IIA (cT3, N0, M0) adenocarcinoma the head of the pancreas   The patient is recovering from the effects of radiation.  ***  Plan:  ***   *** minutes of total time was spent for this patient encounter,  including preparation, face-to-face counseling with the patient and coordination of care, physical exam, and documentation of the encounter. ____________________________________  Lynwood CHARM Nasuti, PhD, MD  This document serves as a record of services personally performed by Lynwood Nasuti, MD. It was created on his behalf by Reymundo Cartwright, a trained medical scribe. The creation of this record is based on the scribe's personal observations and the provider's statements to them. This document has been checked and approved by the attending provider.

## 2024-09-13 ENCOUNTER — Ambulatory Visit (HOSPITAL_COMMUNITY)
Admission: RE | Admit: 2024-09-13 | Discharge: 2024-09-13 | Disposition: A | Discharge status: Home / Self Care | Source: Ambulatory Visit | Attending: Hematology | Admitting: Hematology

## 2024-09-13 ENCOUNTER — Inpatient Hospital Stay: Attending: Hematology

## 2024-09-13 DIAGNOSIS — C250 Malignant neoplasm of head of pancreas: Secondary | ICD-10-CM | POA: Insufficient documentation

## 2024-09-13 DIAGNOSIS — C3490 Malignant neoplasm of unspecified part of unspecified bronchus or lung: Secondary | ICD-10-CM

## 2024-09-13 DIAGNOSIS — Z79899 Other long term (current) drug therapy: Secondary | ICD-10-CM | POA: Insufficient documentation

## 2024-09-13 DIAGNOSIS — C3492 Malignant neoplasm of unspecified part of left bronchus or lung: Secondary | ICD-10-CM

## 2024-09-13 LAB — CMP (CANCER CENTER ONLY)
ALT: 9 U/L (ref 0–44)
AST: 24 U/L (ref 15–41)
Albumin: 4 g/dL (ref 3.5–5.0)
Alkaline Phosphatase: 78 U/L (ref 38–126)
Anion gap: 10 (ref 5–15)
BUN: 9 mg/dL (ref 8–23)
CO2: 25 mmol/L (ref 22–32)
Calcium: 9.2 mg/dL (ref 8.9–10.3)
Chloride: 103 mmol/L (ref 98–111)
Creatinine: 0.72 mg/dL (ref 0.44–1.00)
GFR, Estimated: >60 mL/min
Glucose, Bld: 127 mg/dL — ABNORMAL HIGH (ref 70–99)
Potassium: 3.8 mmol/L (ref 3.5–5.1)
Sodium: 137 mmol/L (ref 135–145)
Total Bilirubin: 0.4 mg/dL (ref 0.0–1.2)
Total Protein: 6.3 g/dL — ABNORMAL LOW (ref 6.5–8.1)

## 2024-09-13 LAB — CBC WITH DIFFERENTIAL (CANCER CENTER ONLY)
Abs Immature Granulocytes: 0.07 K/uL (ref 0.00–0.07)
Basophils Absolute: 0 K/uL (ref 0.0–0.1)
Basophils Relative: 0 %
Eosinophils Absolute: 0.1 K/uL (ref 0.0–0.5)
Eosinophils Relative: 1 %
HCT: 34 % — ABNORMAL LOW (ref 36.0–46.0)
Hemoglobin: 11.9 g/dL — ABNORMAL LOW (ref 12.0–15.0)
Immature Granulocytes: 1 %
Lymphocytes Relative: 11 %
Lymphs Abs: 0.8 K/uL (ref 0.7–4.0)
MCH: 34.7 pg — ABNORMAL HIGH (ref 26.0–34.0)
MCHC: 35 g/dL (ref 30.0–36.0)
MCV: 99.1 fL (ref 80.0–100.0)
Monocytes Absolute: 0.4 K/uL (ref 0.1–1.0)
Monocytes Relative: 5 %
Neutro Abs: 6.3 K/uL (ref 1.7–7.7)
Neutrophils Relative %: 82 %
Platelet Count: 175 K/uL (ref 150–400)
RBC: 3.43 MIL/uL — ABNORMAL LOW (ref 3.87–5.11)
RDW: 12.8 % (ref 11.5–15.5)
WBC Count: 7.7 K/uL (ref 4.0–10.5)
nRBC: 0 % (ref 0.0–0.2)

## 2024-09-13 MED ORDER — HEPARIN SOD (PORK) LOCK FLUSH 100 UNIT/ML IV SOLN
500.0000 [IU] | Freq: Once | INTRAVENOUS | Status: AC
  Administered 2024-09-13: 500 [IU] via INTRAVENOUS

## 2024-09-13 MED ORDER — HEPARIN SOD (PORK) LOCK FLUSH 100 UNIT/ML IV SOLN
INTRAVENOUS | Status: AC
  Filled 2024-09-13: qty 5

## 2024-09-13 MED ORDER — IOHEXOL 350 MG/ML SOLN
80.0000 mL | Freq: Once | INTRAVENOUS | Status: AC | PRN
  Administered 2024-09-13: 80 mL via INTRAVENOUS

## 2024-09-20 NOTE — Assessment & Plan Note (Signed)
 cT3N0M0 -Incidental findings on the CT scan for lung cancer screening/follow-up -Diagnosed in September 2024 by EUS baseline CA 19.9 256 -Patient has synchronized 2 hypermetabolic lesion in her lungs, highly suspicious for malignancy. -Patient declined Whipple surgery due to her advanced age and diagnosis of lung cancer at the same time. -Patient agreed with low intensity chemotherapy with single agent gemcitabine  and started on October 8, 2024and completed in Jan 2025, followed by consolidation radiation SBRT which she completed in March 2025 -restaging CT 02/27/24 showed improved pancreatic mass, and a small indeterminate liver lesion, no definitive evidence of metastasis.

## 2024-09-21 ENCOUNTER — Inpatient Hospital Stay: Admitting: Hematology

## 2024-09-21 VITALS — BP 137/53 | HR 66 | Temp 98.3°F | Resp 18 | Ht 62.5 in | Wt 123.5 lb

## 2024-09-21 DIAGNOSIS — C250 Malignant neoplasm of head of pancreas: Secondary | ICD-10-CM | POA: Diagnosis not present

## 2024-09-21 NOTE — Progress Notes (Unsigned)
 " [facility]   Telephone:([phone] Fax:(336) [phone]   Clinic Follow up Note   Patient Care Team: Beam, Lamar POUR, MD as PCP - General (Family Medicine) Dann Candyce RAMAN, MD as PCP - Cardiology (Cardiology) Kerrin Elspeth BROCKS, MD as Consulting Physician (Cardiothoracic Surgery) Lanny Callander, MD as Consulting Physician (Oncology) Ann Mayme POUR, NP as Nurse Practitioner (Nurse Practitioner) Shannon Agent, MD as Consulting Physician (Radiation Oncology)  Date of Service:  09/21/2024  CHIEF COMPLAINT: f/u of pancreatic cancer  CURRENT THERAPY:  Supportive care  Oncology History   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 -Patient declined Whipple surgery due to her advanced age and diagnosis of lung cancer at the same time. -Patient agreed with low intensity chemotherapy with single agent gemcitabine  and started on October 8, 2024and completed in Jan 2025, followed by consolidation radiation SBRT which she completed in March 2025 -restaging CT 02/27/24 showed improved pancreatic mass, and a small indeterminate liver lesion, no definitive evidence of metastasis.  Assessment & Plan Metastatic pancreatic adenocarcinoma  Progression of peritoneal disease on recent CT scan, consistent with metastatic pancreatic adenocarcinoma. Disease remains incurable. She is minimally symptomatic with mild abdominal burning, decreased appetite, and weight loss. She previously tolerated gemcitabine  and Abraxane with minimal side effects and completed radiation therapy in November 2024. She is not on active cancer treatment and is managed with supportive care. The goal of further therapy is symptom control and life prolongation, considering her quality of  life. - Discussed CT findings of peritoneal progression. - Reviewed treatment options: resuming gemcitabine  and Abraxane at reduced dose every two weeks, or single-agent therapy weekly (two weeks on, one week off). - Discussed that chemotherapy may prolong life and control symptoms, but may cause fatigue, anorexia, and alopecia; she did not experience alopecia previously. - Advised monitoring for pain, bloating, or ascites. - Recommended discussing treatment options with her family prior to decision-making. - Instructed to contact clinic after family discussion to indicate treatment preference; no further appointments scheduled until she initiates contact. - Plan to proceed with chemotherapy only if she desires after family discussion. - Reinforced that treatment goal is symptom control and life prolongation, not cure.  Plan - I personally reviewed her restaging CT scan images and discussed the finding with her, unfortunately it showed metastatic peritoneal disease. - I discussed the treatment options of gemcitabine  with or without Abraxane, versus supportive care.  She wants to talk to her children and with her final decision.   SUMMARY OF ONCOLOGIC HISTORY: Oncology History  Adenocarcinoma of left lung, stage 1 (HCC)  07/06/2018 Initial Diagnosis   Adenocarcinoma of left lung, stage 1 (HCC)   02/15/2021 Cancer Staging   Staging form: Lung, AJCC 8th Edition - Clinical: Stage IA3 (cT1c, cN0, cM0) - Signed by Sherrod Sherrod, MD on 02/15/2021   Adenocarcinoma of head of pancreas (HCC)  03/03/2023 Imaging   CT chest IMPRESSION: 1. Enlarging and concerning right lung nodules, the largest in the right middle lobe measuring up to 1.4 cm in diameter, highly suspicious for primary bronchogenic carcinoma. The right middle lobe nodule is large enough to evaluate by PET-CT which is recommended. 2. No evidence of thoracic adenopathy or pleural effusion. No suspicious left lung nodule status post upper  lobe resection. 3. Aortic Atherosclerosis (ICD10-I70.0) and Emphysema (  ICD10-J43.9).   03/28/2023 PET scan   IMPRESSION: 1. 12 mm medial right middle lobe pulmonary nodule is hypermetabolic and consistent with primary neoplasm. 2. 7 mm right lower lobe nodule and 7 mm right upper lobe nodule do not demonstrate any hypermetabolism but will need close surveillance. 3. No mediastinal or hilar lymphadenopathy. 4. No findings for osseous metastatic disease. 5. Abnormal appearance of the pancreatic head and mild hypermetabolism worrisome for neoplasm. Recommend MRI abdomen without and with contrast for further evaluation. Aortic Atherosclerosis (ICD10-I70.0).   05/04/2023 Imaging   ABD MRI IMPRESSION: 1. Suspicious, solid lesion within the head of pancreas 3.0 x 2.4 x 2.4 cm. There is associated main duct dilatation within the head through tail of pancreas. Suspected lesion corresponds to the area of increased tracer uptake noted on the recent PET-CT. Findings are concerning for pancreatic adenocarcinoma. Motion artifact on the postcontrast images diminishes exam detail within the pancreas. Difficult to exclude involvement of the portal vein. The celiac trunk and proximal SMA appear uninvolved. Consider further evaluation with endoscopic ultrasound and possibly CTA of the abdomen. 2. Scattered T2 hyperintense and T1 hypointense foci within the liver are favored to represent multiple cysts. Unfortunately, motion artifact diminishes exam detail within the liver for the evaluation underlying enhancing liver lesions. Within this limitation, no obvious suspicious lesion identified at this time. 3. Right middle lobe lung nodule is again identified as seen on the previous PET-CT.   05/04/2023 Imaging   Brain MRI IMPRESSION: No evidence of intracranial metastatic disease.   05/16/2023 Tumor Marker   CA 19-9: 256   05/22/2023 Cancer Staging   Staging form: Exocrine Pancreas, AJCC 8th  Edition - Clinical stage from 05/22/2023: Stage IB (cT2, cN0, cM0) - Signed by Burton, Lacie K, NP on 06/09/2023 Stage prefix: Initial diagnosis Total positive nodes: 0   05/22/2023 Procedure   EUS by Dr. Wilhelmenia: 29 x 28 mm mass in the pancreatic head appearing suspicious for adeno, no malignant appearing lymph nodes were visualized.  Abuts the gastroduodenal artery but not involved with the SMA or celiac trunk.  T2 N0 by EUS    05/22/2023 Initial Biopsy    A. PANCREAS, HEAD LESION, FINE NEEDLE ASPIRATION:  FINAL MICROSCOPIC DIAGNOSIS:  - Adenocarcinoma    06/09/2023 Initial Diagnosis   Adenocarcinoma of head of pancreas (HCC)   06/17/2023 - 09/22/2023 Chemotherapy   Patient is on Treatment Plan : LUNG Gemcitabine  (1000) D1,8 q21d      Genetic Testing   [facility] CancerNext-Expanded Panel+RNA was Negative. Report date is 06/24/2023.   The CancerNext-Expanded gene panel offered by [facility] and includes sequencing, rearrangement, and RNA analysis for the following 71 genes: AIP, ALK, APC, ATM, AXIN2, BAP1, BARD1, BMPR1A, BRCA1, BRCA2, BRIP1, CDC73, CDH1, CDK4, CDKN1B, CDKN2A, CHEK2, CTNNA1, DICER1, FH, FLCN, KIF1B, LZTR1, MAX, MEN1, MET, MLH1, MSH2, MSH3, MSH6, MUTYH, NF1, NF2, NTHL1, PALB2, PHOX2B, PMS2, POT1, PRKAR1A, PTCH1, PTEN, RAD51C, RAD51D, RB1, RET, SDHA, SDHAF2, SDHB, SDHC, SDHD, SMAD4, SMARCA4, SMARCB1, SMARCE1, STK11, SUFU, TMEM127, TP53, TSC1, TSC2, and VHL (sequencing and deletion/duplication); EGFR, EGLN1, HOXB13, KIT, MITF, PDGFRA, POLD1, and POLE (sequencing only); EPCAM and GREM1 (deletion/duplication only).    09/08/2023 Imaging   CT abdomen and pelvis with contrast  IMPRESSION: 1. Given cross modality comparison to the MRI of 05/04/2023, similar to minimal decrease in size of the pancreatic head mass. No vascular involvement or metastatic disease identified. 2. Apparent gastric body wall thickening could be due to underdistention. Correlate with symptoms of  gastritis. 3. Incidental  findings, including: Aortic atherosclerosis (ICD10-I70.0) and emphysema (ICD10-J43.9). Small hiatal hernia.      Discussed the use of AI scribe software for clinical note transcription with the patient, who gave verbal consent to proceed.  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  Recent CT imaging was completed last week. She finished radiation therapy in November 2025 and previously received gemcitabine  and Abraxane from October 2024 to January 2025, which was tolerated aside from fatigue.  She now has mild fatigue and a burning epigastric sensation without significant pain, nausea, or vomiting. She stopped Protonix due to concern about long-term use and lack of refill, and is unsure if her symptoms are from this or from her cancer. She reports decreased appetite, eats at least three times daily, and has weight loss with current weight 123 lb. She restarted nutritional supplements such as Ensure or Boost after a period without them.  She notes mild depressive symptoms related to recent bereavement but feels improved and is looking forward to a visit from her son. She remains independent in activities of daily living and drives herself to appointments.     All other systems were reviewed with the patient and are negative.  MEDICAL HISTORY:  Past Medical History:  Diagnosis Date   Anxiety    Blood glucose elevated 05/27/2012   pt. 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 Lynwood Nasuti   History of radiation therapy    Pancreas- 11/19/23-11/28/23- Dr. Lynwood Nasuti   HLD (hyperlipidemia) 12/03/2011   Hypertension    Lung cancer (HCC) dx'd 04/2018   with recurrence 02/2023   Osteopenia 05/30/2015   Overview:  Bone density 05/2015 started fosamax    Pancreatic cancer (HCC) 02/2023   Primary localized osteoarthritis of left knee    Primary localized osteoarthritis of right knee 12/06/2015    SURGICAL HISTORY: Past Surgical History:  Procedure Laterality Date   ABDOMINAL HYSTERECTOMY     BIOPSY  05/22/2023   Procedure: BIOPSY;  Surgeon: Wilhelmenia Aloha Raddle., MD;  Location: [facility];  Service: Gastroenterology;;   CATARACT EXTRACTION, BILATERAL     COLONOSCOPY     ESOPHAGOGASTRODUODENOSCOPY (EGD) WITH PROPOFOL  N/A 05/22/2023   Procedure: ESOPHAGOGASTRODUODENOSCOPY (EGD) WITH PROPOFOL ;  Surgeon: Wilhelmenia Aloha Raddle., MD;  Location: [facility];  Service: Gastroenterology;  Laterality: N/A;   ESOPHAGOGASTRODUODENOSCOPY (EGD) WITH PROPOFOL  N/A 11/06/2023   Procedure: ESOPHAGOGASTRODUODENOSCOPY (EGD) WITH PROPOFOL ;  Surgeon: Wilhelmenia Aloha Raddle., MD;  Location: [facility];  Service: Gastroenterology;  Laterality: N/A;   EUS N/A 05/22/2023   Procedure: UPPER ENDOSCOPIC ULTRASOUND (EUS) RADIAL;  Surgeon: Wilhelmenia Aloha Raddle., MD;  Location: [facility];  Service: Gastroenterology;  Laterality: N/A;   EUS N/A 11/06/2023   Procedure: UPPER ENDOSCOPIC ULTRASOUND (EUS) RADIAL;  Surgeon:  Mansouraty, Aloha Raddle., MD;  Location: [facility];  Service: Gastroenterology;  Laterality: N/A;   EYE SURGERY Bilateral    Cataract   FIDUCIAL MARKER PLACEMENT N/A 11/06/2023   Procedure: FIDUCIAL MARKER  PLACEMENT;  Surgeon: Wilhelmenia Aloha Raddle., MD;  Location: [facility];  Service: Gastroenterology;  Laterality: N/A;   FINE NEEDLE ASPIRATION  05/22/2023   Procedure: FINE NEEDLE ASPIRATION;  Surgeon: Wilhelmenia Aloha Raddle., MD;  Location: [facility];  Service: Gastroenterology;;   implantable loop recorder placement  10/26/2018   MDT LINQ Reveal XT ILR implanted for evaluation of palpitations and atrial fibrillation in office by Dr Kelsie, Device SN [id number])    IR IMAGING GUIDED PORT INSERTION  06/24/2023   KNEE SURGERY  05/01/2016   left uncompartmental    PARTIAL KNEE ARTHROPLASTY Right 12/18/2015   Procedure: UNICOMPARTMENTAL RIGHT KNEE;  Surgeon: Lamar Millman, MD;  Location: [facility];  Service: Orthopedics;  Laterality: Right;   PARTIAL KNEE ARTHROPLASTY Left 05/01/2016   Procedure: UNICOMPARTMENTAL KNEE;  Surgeon: Lamar Millman, MD;  Location: [facility] OR;  Service: Orthopedics;  Laterality: Left;   POLYPECTOMY  05/22/2023   Procedure: POLYPECTOMY;  Surgeon: Mansouraty, Aloha Raddle., MD;  Location: [facility];  Service: Gastroenterology;;   TOTAL ABDOMINAL HYSTERECTOMY W/ BILATERAL SALPINGOOPHORECTOMY     VIDEO ASSISTED THORACOSCOPY (VATS)/ LOBECTOMY Left 05/13/2018   Procedure: VIDEO ASSISTED THORACOSCOPY (VATS)/LEFT UPPER LOBECTOMY;  Surgeon: Kerrin Elspeth BROCKS, MD;  Location: [facility] OR;  Service: Thoracic;  Laterality: Left;    I have reviewed the social history and family history with the patient and they are unchanged from previous note.  ALLERGIES:  is allergic to ambien  [zolpidem  tartrate], sulfa antibiotics, codeine, hydrocodone , and meperidine.  MEDICATIONS:  Current Outpatient Medications  Medication Sig Dispense Refill   albuterol  (VENTOLIN  HFA) 108 (90 Base) MCG/ACT inhaler Inhale 1-2 puffs into the lungs 3 (three) times daily as needed for wheezing or shortness of breath. 1 each 0   Calcium  Carb-Cholecalciferol (CALCIUM -VITAMIN D3) 600-200 MG-UNIT TABS Take by mouth daily.      cetirizine (ZYRTEC) 10 MG tablet Take 10 mg by mouth as needed for allergies.     diphenhydramine -acetaminophen  (TYLENOL  PM) 25-500 MG TABS tablet Take 2 tablets by mouth at bedtime as needed (for sleep).     escitalopram (LEXAPRO) 20 MG tablet Take by mouth daily.     fluticasone  (FLONASE ) 50 MCG/ACT nasal spray PLACE 1-2 SPRAYS INTO BOTH NOSTRILS DAILY. 48 mL 1   lidocaine -prilocaine  (EMLA ) cream Apply to affected area once 30 g 3   metoprolol  succinate (TOPROL -XL) 50 MG 24 hr tablet Take 50 mg by mouth daily. Take with or immediately following a meal.     Multiple Vitamins-Minerals (MULTIVITAMIN PO) Take 1 tablet by mouth daily.     ondansetron  (ZOFRAN ) 8 MG tablet Take 1 tablet (8 mg total) by mouth every 8 (eight) hours as needed for nausea or vomiting. 30 tablet 1   polyethylene glycol (MIRALAX  / GLYCOLAX ) packet Take 8.5 g by mouth every other day.     prochlorperazine  (COMPAZINE ) 10 MG tablet Take 1 tablet (10 mg total) by mouth every 6 (six) hours as needed for nausea or vomiting. 30 tablet 1   simvastatin  (ZOCOR ) 20 MG tablet Take 20 mg by mouth daily.     pantoprazole (PROTONIX) 40 MG tablet Take 40 mg by mouth daily. (Patient not taking: Reported on 09/21/2024)     No current facility-administered medications for this visit.    PHYSICAL EXAMINATION: ECOG PERFORMANCE STATUS: 1 - Symptomatic  but completely ambulatory  Vitals:   09/21/24 1028  BP: (!) 137/53  Pulse: 66  Resp: 18  Temp: 98.3 F (36.8 C)  SpO2: 100%   Wt Readings from Last 3 Encounters:  09/21/24 123 lb 8 oz (56 kg)  08/19/24 125 lb 2 oz (56.8 kg)  07/13/24 131 lb 4.8 oz (59.6 kg)     GENERAL:alert, no distress and comfortable SKIN: skin color, texture, turgor are normal, no rashes or significant lesions EYES: normal, Conjunctiva are pink and non-injected, sclera clear NECK: supple, thyroid normal size, non-tender, without nodularity LYMPH:  no palpable lymphadenopathy in the cervical, axillary   LUNGS: clear to auscultation and percussion with normal breathing effort HEART: regular rate & rhythm and no murmurs and no lower extremity edema ABDOMEN:abdomen soft, non-tender and normal bowel sounds Musculoskeletal:no cyanosis of digits and no clubbing  NEURO: alert & oriented x 3 with fluent speech, no focal motor/sensory deficits  Physical Exam MEASUREMENTS: Weight- 123.  LABORATORY DATA:  I have reviewed the data as listed    Latest Ref Rng & Units 09/13/2024   12:06 PM 07/13/2024    8:54 AM 05/25/2024   12:46 PM  CBC  WBC 4.0 - 10.5 K/uL 7.7  6.3  6.2   Hemoglobin 12.0 - 15.0 g/dL 88.0  88.2  87.4   Hematocrit 36.0 - 46.0 % 34.0  34.1  35.5   Platelets 150 - 400 K/uL 175  173  168         Latest Ref Rng & Units 09/13/2024   12:06 PM 07/13/2024    8:54 AM 05/25/2024   12:46 PM  CMP  Glucose 70 - 99 mg/dL 872  866  852   BUN 8 - 23 mg/dL 9  14  19    Creatinine 0.44 - 1.00 mg/dL 9.27  9.28  9.24   Sodium 135 - 145 mmol/L 137  138  135   Potassium 3.5 - 5.1 mmol/L 3.8  3.6  3.7   Chloride 98 - 111 mmol/L 103  104  102   CO2 22 - 32 mmol/L 25  28  28    Calcium  8.9 - 10.3 mg/dL 9.2  9.4  9.1   Total Protein 6.5 - 8.1 g/dL 6.3  6.2  6.2   Total Bilirubin 0.0 - 1.2 mg/dL 0.4  0.6  0.6   Alkaline Phos 38 - 126 U/L 78  66  76   AST 15 - 41 U/L 24  14  18    ALT 0 - 44 U/L 9  8  9        RADIOGRAPHIC STUDIES: I have personally reviewed the radiological images as listed and agreed with the findings in the report. No results found.    No orders of the defined types were placed in this encounter.  All questions were answered. The patient knows to call the clinic with any problems, questions or concerns. No barriers to learning was detected. The total time spent in the appointment was 40 minutes, including review of chart and various tests results, discussions about plan of care and coordination of care plan     Onita Mattock, MD 09/21/2024     "

## 2024-09-22 ENCOUNTER — Encounter: Payer: Self-pay | Admitting: Hematology
